# Patient Record
Sex: Male | Born: 1935 | Race: White | Hispanic: No | Smoking: Former smoker
Health system: Southern US, Community
[De-identification: ages and names within clinical notes are randomized; demographics above are authoritative.]

## PROBLEM LIST (undated history)

## (undated) DIAGNOSIS — I952 Hypotension due to drugs: Secondary | ICD-10-CM

## (undated) DIAGNOSIS — R001 Bradycardia, unspecified: Secondary | ICD-10-CM

## (undated) DIAGNOSIS — M810 Age-related osteoporosis without current pathological fracture: Secondary | ICD-10-CM

## (undated) DIAGNOSIS — M545 Low back pain, unspecified: Secondary | ICD-10-CM

## (undated) DIAGNOSIS — K5909 Other constipation: Secondary | ICD-10-CM

## (undated) DIAGNOSIS — I429 Cardiomyopathy, unspecified: Secondary | ICD-10-CM

## (undated) DIAGNOSIS — K4090 Unilateral inguinal hernia, without obstruction or gangrene, not specified as recurrent: Secondary | ICD-10-CM

## (undated) DIAGNOSIS — D539 Nutritional anemia, unspecified: Secondary | ICD-10-CM

## (undated) DIAGNOSIS — R55 Syncope and collapse: Secondary | ICD-10-CM

## (undated) DIAGNOSIS — G8929 Other chronic pain: Secondary | ICD-10-CM

## (undated) DIAGNOSIS — I5032 Chronic diastolic (congestive) heart failure: Secondary | ICD-10-CM

## (undated) DIAGNOSIS — G319 Degenerative disease of nervous system, unspecified: Secondary | ICD-10-CM

## (undated) DIAGNOSIS — D509 Iron deficiency anemia, unspecified: Secondary | ICD-10-CM

## (undated) DIAGNOSIS — I2510 Atherosclerotic heart disease of native coronary artery without angina pectoris: Secondary | ICD-10-CM

## (undated) DIAGNOSIS — I219 Acute myocardial infarction, unspecified: Secondary | ICD-10-CM

## (undated) DIAGNOSIS — I447 Left bundle-branch block, unspecified: Secondary | ICD-10-CM

## (undated) DIAGNOSIS — E8809 Other disorders of plasma-protein metabolism, not elsewhere classified: Secondary | ICD-10-CM

## (undated) DIAGNOSIS — M4850XA Collapsed vertebra, not elsewhere classified, site unspecified, initial encounter for fracture: Secondary | ICD-10-CM

## (undated) DIAGNOSIS — I639 Cerebral infarction, unspecified: Principal | ICD-10-CM

## (undated) DIAGNOSIS — K210 Gastro-esophageal reflux disease with esophagitis: Secondary | ICD-10-CM

## (undated) DIAGNOSIS — F0390 Unspecified dementia without behavioral disturbance: Secondary | ICD-10-CM

## (undated) DIAGNOSIS — M479 Spondylosis, unspecified: Secondary | ICD-10-CM

## (undated) DIAGNOSIS — F32A Depression, unspecified: Secondary | ICD-10-CM

## (undated) DIAGNOSIS — F1020 Alcohol dependence, uncomplicated: Secondary | ICD-10-CM

## (undated) DIAGNOSIS — C61 Malignant neoplasm of prostate: Secondary | ICD-10-CM

## (undated) DIAGNOSIS — D638 Anemia in other chronic diseases classified elsewhere: Secondary | ICD-10-CM

## (undated) DIAGNOSIS — I25810 Atherosclerosis of coronary artery bypass graft(s) without angina pectoris: Secondary | ICD-10-CM

## (undated) DIAGNOSIS — K000 Anodontia: Secondary | ICD-10-CM

## (undated) DIAGNOSIS — E7800 Pure hypercholesterolemia, unspecified: Secondary | ICD-10-CM

## (undated) DIAGNOSIS — K760 Fatty (change of) liver, not elsewhere classified: Secondary | ICD-10-CM

## (undated) DIAGNOSIS — N529 Male erectile dysfunction, unspecified: Secondary | ICD-10-CM

## (undated) DIAGNOSIS — E781 Pure hyperglyceridemia: Secondary | ICD-10-CM

## (undated) DIAGNOSIS — I5020 Unspecified systolic (congestive) heart failure: Secondary | ICD-10-CM

## (undated) DIAGNOSIS — S129XXA Fracture of neck, unspecified, initial encounter: Secondary | ICD-10-CM

## (undated) DIAGNOSIS — K219 Gastro-esophageal reflux disease without esophagitis: Secondary | ICD-10-CM

## (undated) DIAGNOSIS — R1032 Left lower quadrant pain: Secondary | ICD-10-CM

## (undated) DIAGNOSIS — I4891 Unspecified atrial fibrillation: Secondary | ICD-10-CM

## (undated) DIAGNOSIS — IMO0002 Reserved for concepts with insufficient information to code with codable children: Secondary | ICD-10-CM

## (undated) DIAGNOSIS — Z8546 Personal history of malignant neoplasm of prostate: Secondary | ICD-10-CM

## (undated) DIAGNOSIS — E871 Hypo-osmolality and hyponatremia: Secondary | ICD-10-CM

## (undated) DIAGNOSIS — Z950 Presence of cardiac pacemaker: Secondary | ICD-10-CM

## (undated) DIAGNOSIS — I10 Essential (primary) hypertension: Secondary | ICD-10-CM

## (undated) DIAGNOSIS — Z9181 History of falling: Secondary | ICD-10-CM

## (undated) DIAGNOSIS — Z593 Problems related to living in residential institution: Secondary | ICD-10-CM

## (undated) DIAGNOSIS — K852 Alcohol induced acute pancreatitis without necrosis or infection: Secondary | ICD-10-CM

## (undated) DIAGNOSIS — F329 Major depressive disorder, single episode, unspecified: Secondary | ICD-10-CM

## (undated) DIAGNOSIS — G9389 Other specified disorders of brain: Secondary | ICD-10-CM

## (undated) DIAGNOSIS — F1010 Alcohol abuse, uncomplicated: Secondary | ICD-10-CM

## (undated) DIAGNOSIS — J309 Allergic rhinitis, unspecified: Secondary | ICD-10-CM

## (undated) DIAGNOSIS — R531 Weakness: Secondary | ICD-10-CM

## (undated) DIAGNOSIS — R1031 Right lower quadrant pain: Secondary | ICD-10-CM

## (undated) DIAGNOSIS — R931 Abnormal findings on diagnostic imaging of heart and coronary circulation: Secondary | ICD-10-CM

## (undated) DIAGNOSIS — I200 Unstable angina: Secondary | ICD-10-CM

## (undated) DIAGNOSIS — G992 Myelopathy in diseases classified elsewhere: Secondary | ICD-10-CM

## (undated) DIAGNOSIS — N390 Urinary tract infection, site not specified: Secondary | ICD-10-CM

## (undated) DIAGNOSIS — D649 Anemia, unspecified: Secondary | ICD-10-CM

## (undated) DIAGNOSIS — R2681 Unsteadiness on feet: Secondary | ICD-10-CM

## (undated) DIAGNOSIS — N401 Enlarged prostate with lower urinary tract symptoms: Secondary | ICD-10-CM

## (undated) DIAGNOSIS — I5022 Chronic systolic (congestive) heart failure: Secondary | ICD-10-CM

## (undated) HISTORY — DX: Iron deficiency anemia, unspecified: D50.9

## (undated) HISTORY — DX: Gastro-esophageal reflux disease with esophagitis: K21.0

## (undated) HISTORY — DX: Other chronic pain: G89.29

## (undated) HISTORY — DX: Acute myocardial infarction, unspecified: I21.9

## (undated) HISTORY — DX: Hypocalcemia: E83.51

## (undated) HISTORY — DX: Syncope and collapse: R55

## (undated) HISTORY — DX: Nutritional anemia, unspecified: D53.9

## (undated) HISTORY — DX: Weakness: R53.1

## (undated) HISTORY — DX: Benign prostatic hyperplasia with lower urinary tract symptoms: N40.1

## (undated) HISTORY — DX: Low back pain: M54.5

## (undated) HISTORY — DX: Allergic rhinitis, unspecified: J30.9

## (undated) HISTORY — DX: Problems related to living in residential institution: Z59.3

## (undated) HISTORY — DX: Chronic systolic (congestive) heart failure: I50.22

## (undated) HISTORY — DX: Fracture of neck, unspecified, initial encounter: S12.9XXA

## (undated) HISTORY — DX: Other constipation: K59.09

## (undated) HISTORY — DX: Cerebral infarction, unspecified: I63.9

## (undated) HISTORY — DX: Hypo-osmolality and hyponatremia: E87.1

## (undated) HISTORY — DX: Unspecified atrial fibrillation: I48.91

## (undated) HISTORY — DX: Urinary tract infection, site not specified: N39.0

## (undated) HISTORY — DX: Bradycardia, unspecified: R00.1

## (undated) HISTORY — DX: Unsteadiness on feet: R26.81

## (undated) HISTORY — PX: FRACTURE SURGERY: SHX138

## (undated) HISTORY — DX: Degenerative disease of nervous system, unspecified: G31.9

## (undated) HISTORY — DX: Unstable angina: I20.0

## (undated) HISTORY — DX: Hypotension due to drugs: I95.2

## (undated) HISTORY — DX: Atherosclerotic heart disease of native coronary artery without angina pectoris: I25.10

## (undated) HISTORY — DX: Male erectile dysfunction, unspecified: N52.9

## (undated) HISTORY — DX: Alcohol dependence, uncomplicated: F10.20

## (undated) HISTORY — DX: Anemia in other chronic diseases classified elsewhere: D63.8

## (undated) HISTORY — DX: Personal history of malignant neoplasm of prostate: Z85.46

## (undated) HISTORY — DX: Abnormal findings on diagnostic imaging of heart and coronary circulation: R93.1

## (undated) HISTORY — DX: Unspecified dementia without behavioral disturbance: F03.90

## (undated) HISTORY — DX: Spondylosis, unspecified: M47.9

## (undated) HISTORY — DX: Alcohol induced acute pancreatitis without necrosis or infection: K85.20

## (undated) HISTORY — DX: Low back pain, unspecified: M54.50

## (undated) HISTORY — DX: Other disorders of plasma-protein metabolism, not elsewhere classified: E88.09

## (undated) HISTORY — DX: Essential (primary) hypertension: I10

## (undated) HISTORY — DX: Other specified disorders of brain: G93.89

## (undated) HISTORY — DX: Reserved for concepts with insufficient information to code with codable children: IMO0002

## (undated) HISTORY — DX: Atherosclerosis of coronary artery bypass graft(s) without angina pectoris: I25.810

## (undated) HISTORY — DX: History of falling: Z91.81

## (undated) HISTORY — DX: Presence of cardiac pacemaker: Z95.0

## (undated) HISTORY — DX: Left bundle-branch block, unspecified: I44.7

## (undated) HISTORY — DX: Malignant neoplasm of prostate: C61

## (undated) HISTORY — PX: ORIF METATARSAL FRACTURE: SUR942

## (undated) HISTORY — DX: Myelopathy in diseases classified elsewhere: G99.2

## (undated) HISTORY — DX: Unilateral inguinal hernia, without obstruction or gangrene, not specified as recurrent: K40.90

## (undated) HISTORY — DX: Chronic diastolic (congestive) heart failure: I50.32

---

## 1983-04-02 HISTORY — PX: CORONARY ARTERY BYPASS GRAFT: SHX141

## 1995-04-02 HISTORY — PX: CORONARY ARTERY BYPASS GRAFT: SHX141

## 1998-05-15 ENCOUNTER — Encounter: Payer: Self-pay | Admitting: Emergency Medicine

## 1998-05-15 ENCOUNTER — Inpatient Hospital Stay (HOSPITAL_COMMUNITY): Admission: EM | Admit: 1998-05-15 | Discharge: 1998-05-18 | Payer: Self-pay | Admitting: Emergency Medicine

## 1998-05-17 ENCOUNTER — Encounter: Payer: Self-pay | Admitting: Emergency Medicine

## 2001-06-27 ENCOUNTER — Encounter: Payer: Self-pay | Admitting: Emergency Medicine

## 2001-06-27 ENCOUNTER — Inpatient Hospital Stay (HOSPITAL_COMMUNITY): Admission: EM | Admit: 2001-06-27 | Discharge: 2001-07-02 | Payer: Self-pay | Admitting: *Deleted

## 2001-06-29 ENCOUNTER — Encounter: Payer: Self-pay | Admitting: Cardiovascular Disease

## 2001-06-30 ENCOUNTER — Encounter: Payer: Self-pay | Admitting: Cardiovascular Disease

## 2001-07-01 ENCOUNTER — Encounter: Payer: Self-pay | Admitting: Cardiovascular Disease

## 2002-02-05 ENCOUNTER — Emergency Department (HOSPITAL_COMMUNITY): Admission: EM | Admit: 2002-02-05 | Discharge: 2002-02-05 | Payer: Self-pay | Admitting: Emergency Medicine

## 2002-02-07 ENCOUNTER — Emergency Department (HOSPITAL_COMMUNITY): Admission: EM | Admit: 2002-02-07 | Discharge: 2002-02-07 | Payer: Self-pay | Admitting: Emergency Medicine

## 2002-02-18 ENCOUNTER — Emergency Department (HOSPITAL_COMMUNITY): Admission: EM | Admit: 2002-02-18 | Discharge: 2002-02-18 | Payer: Self-pay | Admitting: Emergency Medicine

## 2002-02-18 ENCOUNTER — Encounter: Payer: Self-pay | Admitting: Emergency Medicine

## 2002-02-20 ENCOUNTER — Emergency Department (HOSPITAL_COMMUNITY): Admission: EM | Admit: 2002-02-20 | Discharge: 2002-02-20 | Payer: Self-pay | Admitting: Emergency Medicine

## 2002-02-20 ENCOUNTER — Inpatient Hospital Stay (HOSPITAL_COMMUNITY): Admission: EM | Admit: 2002-02-20 | Discharge: 2002-02-22 | Payer: Self-pay | Admitting: Emergency Medicine

## 2002-02-20 ENCOUNTER — Encounter: Payer: Self-pay | Admitting: *Deleted

## 2002-08-23 ENCOUNTER — Inpatient Hospital Stay (HOSPITAL_COMMUNITY): Admission: EM | Admit: 2002-08-23 | Discharge: 2002-08-25 | Payer: Self-pay | Admitting: *Deleted

## 2002-08-23 ENCOUNTER — Encounter: Payer: Self-pay | Admitting: Emergency Medicine

## 2002-08-24 ENCOUNTER — Encounter: Payer: Self-pay | Admitting: Cardiovascular Disease

## 2002-11-23 ENCOUNTER — Emergency Department (HOSPITAL_COMMUNITY): Admission: EM | Admit: 2002-11-23 | Discharge: 2002-11-23 | Payer: Self-pay

## 2003-03-27 ENCOUNTER — Emergency Department (HOSPITAL_COMMUNITY): Admission: EM | Admit: 2003-03-27 | Discharge: 2003-03-27 | Payer: Self-pay | Admitting: Emergency Medicine

## 2003-06-25 ENCOUNTER — Inpatient Hospital Stay (HOSPITAL_COMMUNITY): Admission: EM | Admit: 2003-06-25 | Discharge: 2003-06-27 | Payer: Self-pay | Admitting: Emergency Medicine

## 2004-03-07 ENCOUNTER — Ambulatory Visit: Payer: Self-pay | Admitting: Family Medicine

## 2004-03-13 ENCOUNTER — Ambulatory Visit: Payer: Self-pay | Admitting: Family Medicine

## 2004-03-23 ENCOUNTER — Ambulatory Visit: Payer: Self-pay | Admitting: Sports Medicine

## 2004-05-02 ENCOUNTER — Ambulatory Visit: Payer: Self-pay | Admitting: Family Medicine

## 2004-06-05 ENCOUNTER — Ambulatory Visit: Payer: Self-pay | Admitting: Family Medicine

## 2004-06-13 ENCOUNTER — Ambulatory Visit: Payer: Self-pay | Admitting: Family Medicine

## 2004-06-13 ENCOUNTER — Ambulatory Visit (HOSPITAL_COMMUNITY): Admission: RE | Admit: 2004-06-13 | Discharge: 2004-06-13 | Payer: Self-pay | Admitting: Family Medicine

## 2004-06-13 HISTORY — PX: CATARACT EXTRACTION: SUR2

## 2004-06-30 DIAGNOSIS — C61 Malignant neoplasm of prostate: Secondary | ICD-10-CM

## 2004-06-30 HISTORY — DX: Malignant neoplasm of prostate: C61

## 2004-07-13 ENCOUNTER — Ambulatory Visit: Payer: Self-pay | Admitting: Family Medicine

## 2004-07-13 HISTORY — PX: PROSTATE BIOPSY: SHX241

## 2005-02-06 ENCOUNTER — Ambulatory Visit: Payer: Self-pay | Admitting: Family Medicine

## 2005-02-07 ENCOUNTER — Encounter: Admission: RE | Admit: 2005-02-07 | Discharge: 2005-02-07 | Payer: Self-pay | Admitting: Family Medicine

## 2005-02-27 ENCOUNTER — Ambulatory Visit: Payer: Self-pay | Admitting: Family Medicine

## 2005-03-01 DIAGNOSIS — M479 Spondylosis, unspecified: Secondary | ICD-10-CM

## 2005-03-01 HISTORY — DX: Spondylosis, unspecified: M47.9

## 2005-04-23 ENCOUNTER — Emergency Department (HOSPITAL_COMMUNITY): Admission: EM | Admit: 2005-04-23 | Discharge: 2005-04-23 | Payer: Self-pay | Admitting: Emergency Medicine

## 2005-07-31 ENCOUNTER — Ambulatory Visit: Payer: Self-pay | Admitting: Family Medicine

## 2005-08-05 ENCOUNTER — Ambulatory Visit: Payer: Self-pay | Admitting: Family Medicine

## 2005-08-15 ENCOUNTER — Ambulatory Visit: Payer: Self-pay | Admitting: Family Medicine

## 2005-10-30 DIAGNOSIS — G319 Degenerative disease of nervous system, unspecified: Secondary | ICD-10-CM

## 2005-10-30 DIAGNOSIS — R55 Syncope and collapse: Secondary | ICD-10-CM

## 2005-10-30 HISTORY — DX: Degenerative disease of nervous system, unspecified: G31.9

## 2005-10-30 HISTORY — DX: Syncope and collapse: R55

## 2005-12-04 ENCOUNTER — Ambulatory Visit: Payer: Self-pay | Admitting: Family Medicine

## 2005-12-25 ENCOUNTER — Ambulatory Visit: Payer: Self-pay | Admitting: Family Medicine

## 2006-05-29 DIAGNOSIS — I10 Essential (primary) hypertension: Secondary | ICD-10-CM

## 2006-05-29 DIAGNOSIS — G8929 Other chronic pain: Secondary | ICD-10-CM

## 2006-05-29 DIAGNOSIS — M545 Low back pain, unspecified: Secondary | ICD-10-CM

## 2006-05-29 DIAGNOSIS — K2100 Gastro-esophageal reflux disease with esophagitis, without bleeding: Secondary | ICD-10-CM

## 2006-05-29 DIAGNOSIS — J309 Allergic rhinitis, unspecified: Secondary | ICD-10-CM | POA: Insufficient documentation

## 2006-05-29 DIAGNOSIS — Z8546 Personal history of malignant neoplasm of prostate: Secondary | ICD-10-CM

## 2006-05-29 DIAGNOSIS — E781 Pure hyperglyceridemia: Secondary | ICD-10-CM | POA: Insufficient documentation

## 2006-05-29 HISTORY — DX: Gastro-esophageal reflux disease with esophagitis, without bleeding: K21.00

## 2006-05-29 HISTORY — DX: Other chronic pain: G89.29

## 2006-05-29 HISTORY — DX: Low back pain, unspecified: M54.50

## 2006-05-29 HISTORY — DX: Personal history of malignant neoplasm of prostate: Z85.46

## 2006-05-29 HISTORY — DX: Pure hyperglyceridemia: E78.1

## 2006-05-29 HISTORY — DX: Essential (primary) hypertension: I10

## 2006-05-29 HISTORY — DX: Allergic rhinitis, unspecified: J30.9

## 2006-06-09 ENCOUNTER — Telehealth (INDEPENDENT_AMBULATORY_CARE_PROVIDER_SITE_OTHER): Payer: Self-pay | Admitting: *Deleted

## 2006-06-11 ENCOUNTER — Ambulatory Visit: Payer: Self-pay | Admitting: Family Medicine

## 2006-06-11 DIAGNOSIS — N529 Male erectile dysfunction, unspecified: Secondary | ICD-10-CM

## 2006-06-11 DIAGNOSIS — N138 Other obstructive and reflux uropathy: Secondary | ICD-10-CM

## 2006-06-11 DIAGNOSIS — F1010 Alcohol abuse, uncomplicated: Secondary | ICD-10-CM | POA: Insufficient documentation

## 2006-06-11 DIAGNOSIS — N401 Enlarged prostate with lower urinary tract symptoms: Secondary | ICD-10-CM

## 2006-06-11 HISTORY — DX: Other obstructive and reflux uropathy: N13.8

## 2006-06-11 HISTORY — DX: Male erectile dysfunction, unspecified: N52.9

## 2006-06-11 HISTORY — DX: Alcohol abuse, uncomplicated: F10.10

## 2006-07-11 ENCOUNTER — Telehealth: Payer: Self-pay | Admitting: Family Medicine

## 2006-08-12 ENCOUNTER — Telehealth: Payer: Self-pay | Admitting: *Deleted

## 2006-08-18 ENCOUNTER — Ambulatory Visit: Payer: Self-pay | Admitting: Family Medicine

## 2006-08-18 LAB — CONVERTED CEMR LAB
AST: 30 units/L (ref 0–37)
Albumin: 3.9 g/dL (ref 3.5–5.2)
BUN: 6 mg/dL (ref 6–23)
Calcium: 9.1 mg/dL (ref 8.4–10.5)
Chloride: 95 meq/L — ABNORMAL LOW (ref 96–112)
Glucose, Bld: 85 mg/dL (ref 70–99)
HDL: 66 mg/dL (ref >39)
Potassium: 4.3 meq/L (ref 3.5–5.3)
Triglycerides: 402 mg/dL — ABNORMAL HIGH (ref <150)

## 2006-08-20 ENCOUNTER — Ambulatory Visit: Payer: Self-pay | Admitting: Family Medicine

## 2006-09-03 ENCOUNTER — Telehealth (INDEPENDENT_AMBULATORY_CARE_PROVIDER_SITE_OTHER): Payer: Self-pay | Admitting: Family Medicine

## 2006-10-14 ENCOUNTER — Telehealth: Payer: Self-pay | Admitting: Family Medicine

## 2006-10-15 ENCOUNTER — Telehealth: Payer: Self-pay | Admitting: *Deleted

## 2006-10-27 ENCOUNTER — Encounter (INDEPENDENT_AMBULATORY_CARE_PROVIDER_SITE_OTHER): Payer: Self-pay | Admitting: Family Medicine

## 2006-10-27 ENCOUNTER — Ambulatory Visit: Payer: Self-pay | Admitting: Family Medicine

## 2006-10-27 LAB — CONVERTED CEMR LAB: Direct LDL: 83 mg/dL

## 2007-02-12 ENCOUNTER — Ambulatory Visit: Payer: Self-pay | Admitting: Family Medicine

## 2007-02-12 DIAGNOSIS — Z8719 Personal history of other diseases of the digestive system: Secondary | ICD-10-CM

## 2007-02-12 LAB — CONVERTED CEMR LAB
HDL goal, serum: 40 mg/dL
LDL Goal: 100 mg/dL

## 2007-02-24 ENCOUNTER — Telehealth: Payer: Self-pay | Admitting: *Deleted

## 2007-03-02 ENCOUNTER — Encounter: Payer: Self-pay | Admitting: Family Medicine

## 2007-03-02 LAB — CONVERTED CEMR LAB: PSA: 4.04 ng/mL

## 2007-04-17 ENCOUNTER — Encounter: Payer: Self-pay | Admitting: Family Medicine

## 2007-05-02 ENCOUNTER — Ambulatory Visit: Payer: Self-pay | Admitting: Family Medicine

## 2007-05-02 ENCOUNTER — Observation Stay (HOSPITAL_COMMUNITY): Admission: EM | Admit: 2007-05-02 | Discharge: 2007-05-02 | Payer: Self-pay | Admitting: Emergency Medicine

## 2007-05-02 ENCOUNTER — Encounter: Payer: Self-pay | Admitting: Family Medicine

## 2007-05-02 LAB — CONVERTED CEMR LAB: LDL Cholesterol: 58 mg/dL

## 2007-05-07 ENCOUNTER — Telehealth: Payer: Self-pay | Admitting: Family Medicine

## 2007-07-31 DIAGNOSIS — R001 Bradycardia, unspecified: Secondary | ICD-10-CM

## 2007-07-31 HISTORY — PX: CARDIAC CATHETERIZATION: SHX172

## 2007-07-31 HISTORY — DX: Bradycardia, unspecified: R00.1

## 2007-08-07 ENCOUNTER — Ambulatory Visit: Payer: Self-pay | Admitting: Internal Medicine

## 2007-08-07 ENCOUNTER — Encounter: Payer: Self-pay | Admitting: *Deleted

## 2007-08-07 ENCOUNTER — Telehealth: Payer: Self-pay | Admitting: Family Medicine

## 2007-08-08 ENCOUNTER — Encounter (INDEPENDENT_AMBULATORY_CARE_PROVIDER_SITE_OTHER): Payer: Self-pay | Admitting: Interventional Cardiology

## 2007-08-08 ENCOUNTER — Inpatient Hospital Stay (HOSPITAL_COMMUNITY): Admission: RE | Admit: 2007-08-08 | Discharge: 2007-08-09 | Payer: Self-pay | Admitting: Interventional Cardiology

## 2007-08-08 ENCOUNTER — Encounter: Payer: Self-pay | Admitting: Family Medicine

## 2007-08-08 ENCOUNTER — Ambulatory Visit: Payer: Self-pay | Admitting: Internal Medicine

## 2007-08-08 LAB — CONVERTED CEMR LAB: Potassium: 4.5 meq/L

## 2007-08-09 ENCOUNTER — Encounter (INDEPENDENT_AMBULATORY_CARE_PROVIDER_SITE_OTHER): Payer: Self-pay | Admitting: Family Medicine

## 2007-08-11 ENCOUNTER — Ambulatory Visit: Payer: Self-pay | Admitting: Family Medicine

## 2007-08-18 ENCOUNTER — Encounter: Payer: Self-pay | Admitting: Family Medicine

## 2007-08-27 ENCOUNTER — Ambulatory Visit: Payer: Self-pay | Admitting: Family Medicine

## 2007-08-27 DIAGNOSIS — G992 Myelopathy in diseases classified elsewhere: Secondary | ICD-10-CM

## 2007-08-27 HISTORY — DX: Myelopathy in diseases classified elsewhere: G99.2

## 2007-08-28 ENCOUNTER — Encounter: Payer: Self-pay | Admitting: Family Medicine

## 2007-08-31 HISTORY — PX: CATARACT EXTRACTION: SUR2

## 2007-09-17 ENCOUNTER — Telehealth: Payer: Self-pay | Admitting: Family Medicine

## 2007-10-01 ENCOUNTER — Ambulatory Visit: Payer: Self-pay | Admitting: Family Medicine

## 2007-10-01 LAB — CONVERTED CEMR LAB
HCT: 37.8 % — ABNORMAL LOW (ref 39.0–52.0)
Hemoglobin: 12.8 g/dL — ABNORMAL LOW (ref 13.0–17.0)
RBC: 3.83 M/uL — ABNORMAL LOW (ref 4.22–5.81)
RDW: 12.8 % (ref 11.5–15.5)
WBC: 5.3 10*3/uL (ref 4.0–10.5)

## 2008-01-15 ENCOUNTER — Ambulatory Visit: Payer: Self-pay | Admitting: Family Medicine

## 2008-01-22 ENCOUNTER — Telehealth: Payer: Self-pay | Admitting: *Deleted

## 2008-02-09 ENCOUNTER — Ambulatory Visit: Payer: Self-pay | Admitting: Family Medicine

## 2008-03-17 ENCOUNTER — Ambulatory Visit: Payer: Self-pay | Admitting: Family Medicine

## 2008-03-17 ENCOUNTER — Ambulatory Visit (HOSPITAL_COMMUNITY): Admission: RE | Admit: 2008-03-17 | Discharge: 2008-03-17 | Payer: Self-pay | Admitting: Family Medicine

## 2008-03-17 DIAGNOSIS — D649 Anemia, unspecified: Secondary | ICD-10-CM | POA: Insufficient documentation

## 2008-03-17 LAB — CONVERTED CEMR LAB
AST: 17 units/L (ref 0–37)
Alkaline Phosphatase: 55 units/L (ref 39–117)
BUN: 15 mg/dL (ref 6–23)
Creatinine, Ser: 1.05 mg/dL (ref 0.40–1.50)
Glucose, Bld: 94 mg/dL (ref 70–99)
HCT: 39.9 % (ref 39.0–52.0)
Hemoglobin: 13.4 g/dL (ref 13.0–17.0)
MCHC: 33.6 g/dL (ref 30.0–36.0)
MCV: 96.1 fL (ref 78.0–100.0)
RDW: 12.6 % (ref 11.5–15.5)
Total Bilirubin: 1.4 mg/dL — ABNORMAL HIGH (ref 0.3–1.2)

## 2008-03-29 ENCOUNTER — Encounter: Payer: Self-pay | Admitting: Family Medicine

## 2008-03-29 LAB — CONVERTED CEMR LAB
OCCULT 2: NEGATIVE
OCCULT 3: NEGATIVE

## 2008-03-30 ENCOUNTER — Encounter: Payer: Self-pay | Admitting: Family Medicine

## 2008-04-25 ENCOUNTER — Encounter: Payer: Self-pay | Admitting: Family Medicine

## 2008-05-05 ENCOUNTER — Ambulatory Visit: Payer: Self-pay | Admitting: Family Medicine

## 2008-05-05 DIAGNOSIS — K4090 Unilateral inguinal hernia, without obstruction or gangrene, not specified as recurrent: Secondary | ICD-10-CM | POA: Insufficient documentation

## 2008-05-05 HISTORY — DX: Unilateral inguinal hernia, without obstruction or gangrene, not specified as recurrent: K40.90

## 2008-06-01 ENCOUNTER — Inpatient Hospital Stay (HOSPITAL_COMMUNITY): Admission: AD | Admit: 2008-06-01 | Discharge: 2008-06-02 | Payer: Self-pay | Admitting: General Surgery

## 2008-06-15 ENCOUNTER — Emergency Department (HOSPITAL_COMMUNITY): Admission: EM | Admit: 2008-06-15 | Discharge: 2008-06-16 | Payer: Self-pay | Admitting: Emergency Medicine

## 2008-08-18 ENCOUNTER — Ambulatory Visit: Payer: Self-pay | Admitting: Family Medicine

## 2008-08-18 LAB — CONVERTED CEMR LAB

## 2008-08-30 ENCOUNTER — Ambulatory Visit: Admission: RE | Admit: 2008-08-30 | Discharge: 2008-08-30 | Payer: Self-pay | Admitting: Radiation Oncology

## 2008-09-06 ENCOUNTER — Encounter: Payer: Self-pay | Admitting: Family Medicine

## 2008-09-06 ENCOUNTER — Ambulatory Visit: Admission: RE | Admit: 2008-09-06 | Discharge: 2008-12-05 | Payer: Self-pay | Admitting: Radiation Oncology

## 2008-09-07 ENCOUNTER — Encounter: Payer: Self-pay | Admitting: Family Medicine

## 2008-09-09 ENCOUNTER — Telehealth: Payer: Self-pay | Admitting: Family Medicine

## 2008-10-27 ENCOUNTER — Ambulatory Visit (HOSPITAL_BASED_OUTPATIENT_CLINIC_OR_DEPARTMENT_OTHER): Admission: RE | Admit: 2008-10-27 | Discharge: 2008-10-27 | Payer: Self-pay | Admitting: Urology

## 2008-10-27 ENCOUNTER — Encounter: Payer: Self-pay | Admitting: Family Medicine

## 2008-11-17 ENCOUNTER — Encounter: Payer: Self-pay | Admitting: Family Medicine

## 2008-11-22 ENCOUNTER — Encounter: Payer: Self-pay | Admitting: Family Medicine

## 2009-01-02 ENCOUNTER — Encounter: Payer: Self-pay | Admitting: Family Medicine

## 2009-01-02 ENCOUNTER — Ambulatory Visit: Admission: RE | Admit: 2009-01-02 | Discharge: 2009-03-07 | Payer: Self-pay | Admitting: Radiation Oncology

## 2009-02-09 ENCOUNTER — Encounter: Payer: Self-pay | Admitting: *Deleted

## 2009-02-27 ENCOUNTER — Telehealth: Payer: Self-pay | Admitting: *Deleted

## 2009-03-02 ENCOUNTER — Telehealth (INDEPENDENT_AMBULATORY_CARE_PROVIDER_SITE_OTHER): Payer: Self-pay | Admitting: *Deleted

## 2009-03-09 ENCOUNTER — Ambulatory Visit: Payer: Self-pay | Admitting: Family Medicine

## 2009-03-09 DIAGNOSIS — G250 Essential tremor: Secondary | ICD-10-CM | POA: Insufficient documentation

## 2009-03-09 DIAGNOSIS — G252 Other specified forms of tremor: Secondary | ICD-10-CM

## 2009-03-17 ENCOUNTER — Encounter: Payer: Self-pay | Admitting: Family Medicine

## 2009-03-18 LAB — CONVERTED CEMR LAB
AST: 49 units/L — ABNORMAL HIGH (ref 0–37)
Albumin: 4.6 g/dL (ref 3.5–5.2)
Alkaline Phosphatase: 79 units/L (ref 39–117)
BUN: 12 mg/dL (ref 6–23)
Calcium: 9.3 mg/dL (ref 8.4–10.5)
Chloride: 92 meq/L — ABNORMAL LOW (ref 96–112)
Glucose, Bld: 84 mg/dL (ref 70–99)
HDL: 87 mg/dL (ref >39)
Hemoglobin: 13.9 g/dL (ref 13.0–17.0)
LDL Cholesterol: 72 mg/dL (ref 0–99)
Potassium: 4 meq/L (ref 3.5–5.3)
RBC: 4.09 M/uL — ABNORMAL LOW (ref 4.22–5.81)
Sodium: 133 meq/L — ABNORMAL LOW (ref 135–145)
Total Protein: 7.2 g/dL (ref 6.0–8.3)
Triglycerides: 106 mg/dL (ref <150)

## 2009-03-30 ENCOUNTER — Telehealth: Payer: Self-pay | Admitting: Family Medicine

## 2009-04-01 DIAGNOSIS — R931 Abnormal findings on diagnostic imaging of heart and coronary circulation: Secondary | ICD-10-CM

## 2009-04-01 DIAGNOSIS — I4891 Unspecified atrial fibrillation: Secondary | ICD-10-CM

## 2009-04-01 HISTORY — DX: Unspecified atrial fibrillation: I48.91

## 2009-04-01 HISTORY — DX: Abnormal findings on diagnostic imaging of heart and coronary circulation: R93.1

## 2009-04-05 ENCOUNTER — Ambulatory Visit: Payer: Self-pay | Admitting: Family Medicine

## 2009-04-05 ENCOUNTER — Encounter: Payer: Self-pay | Admitting: Family Medicine

## 2009-04-05 LAB — CONVERTED CEMR LAB
ALT: 42 units/L (ref 0–53)
Albumin: 4.1 g/dL (ref 3.5–5.2)
CO2: 25 meq/L (ref 19–32)
Calcium: 8.9 mg/dL (ref 8.4–10.5)
Chloride: 92 meq/L — ABNORMAL LOW (ref 96–112)
Glucose, Bld: 92 mg/dL (ref 70–99)
Sodium: 133 meq/L — ABNORMAL LOW (ref 135–145)
Total Bilirubin: 0.9 mg/dL (ref 0.3–1.2)
Total Protein: 6.3 g/dL (ref 6.0–8.3)

## 2009-04-12 ENCOUNTER — Ambulatory Visit: Payer: Self-pay | Admitting: Internal Medicine

## 2009-04-12 ENCOUNTER — Ambulatory Visit: Payer: Self-pay | Admitting: Family Medicine

## 2009-04-12 ENCOUNTER — Inpatient Hospital Stay (HOSPITAL_COMMUNITY): Admission: EM | Admit: 2009-04-12 | Discharge: 2009-04-14 | Payer: Self-pay | Admitting: Emergency Medicine

## 2009-04-12 ENCOUNTER — Encounter (INDEPENDENT_AMBULATORY_CARE_PROVIDER_SITE_OTHER): Payer: Self-pay | Admitting: Internal Medicine

## 2009-04-12 ENCOUNTER — Encounter: Payer: Self-pay | Admitting: Family Medicine

## 2009-04-14 ENCOUNTER — Encounter: Payer: Self-pay | Admitting: Internal Medicine

## 2009-04-14 ENCOUNTER — Encounter: Payer: Self-pay | Admitting: Family Medicine

## 2009-04-17 ENCOUNTER — Encounter: Payer: Self-pay | Admitting: Family Medicine

## 2009-04-17 ENCOUNTER — Ambulatory Visit: Payer: Self-pay | Admitting: Family Medicine

## 2009-04-17 LAB — CONVERTED CEMR LAB
CO2: 25 meq/L (ref 19–32)
Calcium: 9.1 mg/dL (ref 8.4–10.5)
Chloride: 99 meq/L (ref 96–112)
Glucose, Bld: 95 mg/dL (ref 70–99)
Platelets: 150 10*3/uL (ref 150–400)
Potassium: 3.8 meq/L (ref 3.5–5.3)
RBC: 3.12 M/uL — ABNORMAL LOW (ref 4.22–5.81)
Sodium: 137 meq/L (ref 135–145)
WBC: 4.2 10*3/uL (ref 4.0–10.5)

## 2009-04-19 ENCOUNTER — Encounter: Payer: Self-pay | Admitting: Family Medicine

## 2009-04-20 ENCOUNTER — Ambulatory Visit (HOSPITAL_COMMUNITY): Admission: RE | Admit: 2009-04-20 | Discharge: 2009-04-20 | Payer: Self-pay | Admitting: Family Medicine

## 2009-04-20 ENCOUNTER — Ambulatory Visit: Payer: Self-pay | Admitting: Family Medicine

## 2009-04-20 DIAGNOSIS — K5909 Other constipation: Secondary | ICD-10-CM

## 2009-04-20 HISTORY — DX: Other constipation: K59.09

## 2009-04-25 ENCOUNTER — Encounter: Payer: Self-pay | Admitting: Family Medicine

## 2009-04-26 ENCOUNTER — Encounter: Payer: Self-pay | Admitting: Family Medicine

## 2009-04-26 ENCOUNTER — Ambulatory Visit: Payer: Self-pay | Admitting: Internal Medicine

## 2009-04-26 DIAGNOSIS — I447 Left bundle-branch block, unspecified: Secondary | ICD-10-CM

## 2009-04-26 HISTORY — DX: Left bundle-branch block, unspecified: I44.7

## 2009-05-04 ENCOUNTER — Telehealth: Payer: Self-pay | Admitting: Family Medicine

## 2009-09-29 HISTORY — PX: INSERTION PROSTATE RADIATION SEED: SUR718

## 2009-10-26 ENCOUNTER — Ambulatory Visit: Payer: Self-pay | Admitting: Family Medicine

## 2009-10-26 LAB — CONVERTED CEMR LAB
ALT: 34 units/L (ref 0–53)
BUN: 11 mg/dL (ref 6–23)
CO2: 24 meq/L (ref 19–32)
Calcium: 9.1 mg/dL (ref 8.4–10.5)
Chloride: 91 meq/L — ABNORMAL LOW (ref 96–112)
Creatinine, Ser: 0.99 mg/dL (ref 0.40–1.50)
Direct LDL: 53 mg/dL
Glucose, Bld: 75 mg/dL (ref 70–99)
Total Bilirubin: 1.6 mg/dL — ABNORMAL HIGH (ref 0.3–1.2)

## 2009-10-31 ENCOUNTER — Encounter: Payer: Self-pay | Admitting: Family Medicine

## 2009-10-31 DIAGNOSIS — R5383 Other fatigue: Secondary | ICD-10-CM

## 2009-10-31 DIAGNOSIS — E871 Hypo-osmolality and hyponatremia: Secondary | ICD-10-CM

## 2009-10-31 DIAGNOSIS — R5381 Other malaise: Secondary | ICD-10-CM

## 2009-11-06 ENCOUNTER — Encounter: Payer: Self-pay | Admitting: Family Medicine

## 2009-11-10 ENCOUNTER — Encounter: Payer: Self-pay | Admitting: Family Medicine

## 2009-11-10 ENCOUNTER — Ambulatory Visit: Payer: Self-pay | Admitting: Family Medicine

## 2009-11-10 ENCOUNTER — Inpatient Hospital Stay (HOSPITAL_COMMUNITY): Admission: AD | Admit: 2009-11-10 | Discharge: 2009-11-11 | Payer: Self-pay | Admitting: Family Medicine

## 2009-11-10 LAB — CONVERTED CEMR LAB
ALT: 36 units/L
Albumin: 3.1 g/dL
Creatinine, Ser: 0.8 mg/dL
HDL: 74 mg/dL
Pro B Natriuretic peptide (BNP): 323 pg/mL
Sodium: 131 meq/L
Triglyceride fasting, serum: 107 mg/dL

## 2009-11-11 LAB — CONVERTED CEMR LAB: Platelets: 93000 10*3/uL

## 2009-12-12 ENCOUNTER — Encounter: Payer: Self-pay | Admitting: Family Medicine

## 2009-12-12 DIAGNOSIS — I5032 Chronic diastolic (congestive) heart failure: Secondary | ICD-10-CM

## 2009-12-12 HISTORY — DX: Chronic diastolic (congestive) heart failure: I50.32

## 2009-12-14 ENCOUNTER — Ambulatory Visit: Payer: Self-pay | Admitting: Family Medicine

## 2009-12-14 DIAGNOSIS — I4891 Unspecified atrial fibrillation: Secondary | ICD-10-CM

## 2009-12-14 HISTORY — DX: Unspecified atrial fibrillation: I48.91

## 2009-12-14 LAB — CONVERTED CEMR LAB
Basophils Absolute: 0 10*3/uL (ref 0.0–0.1)
Basophils Relative: 0 % (ref 0–1)
CO2: 26 meq/L (ref 19–32)
Calcium: 9.3 mg/dL (ref 8.4–10.5)
Eosinophils Absolute: 0.1 10*3/uL (ref 0.0–0.7)
Eosinophils Relative: 1 % (ref 0–5)
HCT: 35.2 % — ABNORMAL LOW (ref 39.0–52.0)
Lymphocytes Relative: 27 % (ref 12–46)
MCHC: 34.4 g/dL (ref 30.0–36.0)
Platelets: 156 10*3/uL (ref 150–400)
RDW: 12.8 % (ref 11.5–15.5)
Sodium: 130 meq/L — ABNORMAL LOW (ref 135–145)

## 2009-12-15 ENCOUNTER — Encounter: Payer: Self-pay | Admitting: Family Medicine

## 2010-02-24 ENCOUNTER — Encounter: Payer: Self-pay | Admitting: Family Medicine

## 2010-03-30 DIAGNOSIS — R17 Unspecified jaundice: Secondary | ICD-10-CM

## 2010-04-24 ENCOUNTER — Encounter: Payer: Self-pay | Admitting: Family Medicine

## 2010-04-24 ENCOUNTER — Ambulatory Visit
Admission: RE | Admit: 2010-04-24 | Discharge: 2010-04-24 | Payer: Self-pay | Source: Home / Self Care | Attending: Family Medicine | Admitting: Family Medicine

## 2010-04-24 DIAGNOSIS — D539 Nutritional anemia, unspecified: Secondary | ICD-10-CM | POA: Insufficient documentation

## 2010-04-24 DIAGNOSIS — Z9181 History of falling: Secondary | ICD-10-CM

## 2010-04-24 HISTORY — DX: Nutritional anemia, unspecified: D53.9

## 2010-04-24 HISTORY — DX: History of falling: Z91.81

## 2010-04-24 LAB — CONVERTED CEMR LAB
Albumin: 4.3 g/dL (ref 3.5–5.2)
BUN: 14 mg/dL (ref 6–23)
CO2: 25 meq/L (ref 19–32)
Calcium: 9.5 mg/dL (ref 8.4–10.5)
Chloride: 91 meq/L — ABNORMAL LOW (ref 96–112)
Folate: >20 ng/mL
Glucose, Bld: 93 mg/dL (ref 70–99)
Hemoglobin: 12.4 g/dL — ABNORMAL LOW (ref 13.0–17.0)
Potassium: 3.8 meq/L (ref 3.5–5.3)
RBC: 3.68 M/uL — ABNORMAL LOW (ref 4.22–5.81)
WBC: 4.8 10*3/uL (ref 4.0–10.5)

## 2010-04-25 ENCOUNTER — Encounter: Payer: Self-pay | Admitting: Family Medicine

## 2010-04-29 LAB — CONVERTED CEMR LAB
Hemoglobin: 12.8 g/dL — ABNORMAL LOW (ref 13.0–17.0)
RDW: 12 % (ref 11.5–15.5)
WBC: 4.3 10*3/uL (ref 4.0–10.5)

## 2010-05-01 NOTE — Consult Note (Signed)
Summary: [facility]   [facility]   Imported By: Clydell Hakim 11/23/2009 11:27:29  _____________________________________________________________________  External Attachment:    Type:   Image     Comment:   External Document

## 2010-05-01 NOTE — Letter (Signed)
Summary: Results Follow-up Letter  [facility]  [address]   [place], [place] [address]   Phone: [phone]  Fax: [phone]    10/31/2009  [address] [place], [place]  [address]  Dear Mr. Barfield,   The following are the results of your recent test(s): Patient: [name]  Tests: (1) Comprehensive Metabolic Panel (37169)   Sodium               [L]  130 mEq/L                   135-145   Potassium                 4.5 mEq/L                   3.5-5.3   Chloride             [L]  91 mEq/L                    96-112   CO2                       24 mEq/L                    19-32   Glucose                   75 mg/dL                    67-89   BUN                       11 mg/dL                    3-81   Creatinine                0.99 mg/dL                  0.40-1.50   Bilirubin, Total     [H]  1.6 mg/dL                   0.1-7.5   Alkaline Phosphatase      60 U/L                      39-117   AST/SGOT             [H]  40 U/L                      0-37   ALT/SGPT                  34 U/L                      0-53   Total Protein             6.6 g/dL                    1.0-2.5   Albumin                   4.2 g/dL                    8.5-2.7   Calcium  9.1 mg/dL                   3.0-86.5  Tests: (2) LDL, Direct (78469)   LDL, Direct               53 mg/dL     ATP III Classification (LDL):           < 100        mg/dL         Optimal  Document Creation Date: 10/26/2009 8:41 PM  Sincerely,  Zachery Dauer MD [facility]           Appended Document: Results Follow-up Letter Result called to patient. He's using Sea Salt sparingly. I asked him to come in for BMET next week to make sure the sodium of 130 is not going lower;   Clinical Lists Changes  Problems: Added new problem of HYPONATREMIA, MILD (ICD-276.1) Added new problem of WEAKNESS (ICD-780.79) Orders: Added new Test order of CBC-[facility] [phone]) - Signed Added new Test  order of Retic-[facility] ([id number]) - Signed Added new Test order of Basic Met-[facility] [phone]) - Signed Added new Test order of Vit D, 25 [place]-[facility] ([id number]) - Signed Added new Test order of Ferritin-[facility] [phone]) - Signed

## 2010-05-01 NOTE — Assessment & Plan Note (Signed)
Summary: hfu,df   Vital Signs:  Patient profile:   75 year old male Weight:      185.1 pounds Temp:     98.3 degrees F oral Pulse rate:   121 / minute BP sitting:   148 / 98  (left arm) Cuff size:   regular  Vitals Entered By: Loralee Pacas CMA (April 20, 2009 9:51 AM) Comments irregular heartbeat, has an appt with Dr. Katrinka Blazing 05/15/09 but he would like to go in sooner considering whats going on with him at this point.   Primary Care Provider:  Zachery Dauer MD   History of Present Illness: Hospital follow up apt [place]  Reviwed discharge summary:  1) New onset A fib/flutter:  decision was made not to anticoag secondary to [place] score of 1, and risk for falls secondary to chronic ETOH use.  Patient reports being sober since January 10.  He is aware that his pulse if fast (beta blocker was decreased in hospital from 100 mg to 50 mg). Apt with cardiology moved up today, because of increased heart rate to 120.  2) Urinary Retention:  seen yesterday by oncologist and has apt. tomorrow with Dr. Annabell Howells.  He has radiation seed implants for treatment of prostate cancer. Descibes stream as slow, urinates often and small amounts.    3) Constipation:  still a problem, uses as needed MOM, and trying to eat foods high in fiber, received list from care manager.  Would like to be on a laxative.  Disucssed PEG, and will begin.  4) Pancytopenia during hospital stay, presumed related to Protonix or Heparin.  Labs drawn on 1/17, showed significant improvement in all cell lines.  5)  Electrolye imbalance: appears to be resolved  6)  hypotension during hospital stay:  BP up today to 146/98, increased beta blocker.  Current Medications (verified): 1)  Bayer Childrens Aspirin 81 Mg Chew (Aspirin) .... Take 1 Tablet By Mouth Once A Day 2)  Fish Oil Concentrate 1000 Mg  Caps (Omega-3 Fatty Acids) .... Take One Tablet Daily 3)  Omeprazole 20 Mg Tbec (Omeprazole) .... Take One Tablet  Daily 4)  Simvastatin 20 Mg Tabs (Simvastatin) .... Take One Tablet Daily At Bedtime 5)  Tramadol-Acetaminophen 37.5-325 Mg  Tabs (Tramadol-Acetaminophen) .... Take 1-2 Tabs Every 6 Hours As Needed 6)  Centrum Silver   Tabs (Multiple Vitamins-Minerals) .... Take One Tablet Daily 7)  Toprol Xl 100 Mg Xr24h-Tab (Metoprolol Succinate) .... Take One Tablet Daily 8)  Hydrochlorothiazide 12.5 Mg  Tabs (Hydrochlorothiazide) .... Take 1 Tab  By Mouth Every Morning 9)  Lisinopril 20 Mg Tabs (Lisinopril) .... One Daily 10)  Nitroglycerin 0.4 Mg/hr Pt24 (Nitroglycerin) .... Take One For Chest Pain, May Repeat in 5 Minutes Times Two More Doses, If Not Releif Seek Emergency Help. 1 Bottle 11)  Peg 3350  Powd (Polyethylene Glycol 3350) .... One Capful in 4 Oz Liquid Two Times A Day For 3-4 Days Then Daily, Qs  Allergies (verified): No Known Drug Allergies  Review of Systems General:  Denies fatigue, loss of appetite, sleep disorder, and sweats. CV:  Complains of lightheadness and shortness of breath with exertion; denies chest pain or discomfort, difficulty breathing while lying down, fainting, near fainting, and swelling of feet. GI:  Complains of constipation. GU:  Complains of urinary frequency and urinary hesitancy.  Physical Exam  General:  Alert, moved slowly and cautiously Lungs:  normal respiratory effort, normal breath sounds, and no crackles.   Heart:  Tachy, sounded regular, EKG rate at 133, wide QRS (old, pt with LBBB), fairly regular but unable to see p waves because of rate.  Not in flutter.   Impression & Recommendations:  Problem # 1:  ATRIAL TACHYCARDIA (ICD-427.89) A fib in hospital, pushed cardiology apt up from 2/14 to next week.  He was instructed to not overexert, to have a low threshold for calling 911 if he has signs of stroke, chest pain, or lightheadness that persists.  Increased Toprol Xr from 50 mg to 100 mg.  Added NTG or chest pain, although he did not report this symptom  (history of CAD/CABG) His updated medication list for this problem includes:    Bayer Childrens Aspirin 81 Mg Chew (Aspirin) .Marland Kitchen... Take 1 tablet by mouth once a day    Toprol Xl 100 Mg Xr24h-tab (Metoprolol succinate) .Marland Kitchen... Take one tablet daily  Orders: 12 Lead EKG (12 Lead EKG) [facility]- Est  Level 4 (78295)  Problem # 2:  HYPERTENSION, BENIGN SYSTEMIC (ICD-401.1)  Meds decreased in hospital secondary to hypotension, increased beta blocker today (BP up), and ACE from 10 mg to 20 mg ( back to previous dose). The following medications were removed from the medication list:    Lisinopril-hydrochlorothiazide 20-12.5 Mg Tabs (Lisinopril-hydrochlorothiazide) ..... One tablet by mouth daily His updated medication list for this problem includes:    Toprol Xl 100 Mg Xr24h-tab (Metoprolol succinate) .Marland Kitchen... Take one tablet daily    Hydrochlorothiazide 12.5 Mg Tabs (Hydrochlorothiazide) .Marland Kitchen... Take 1 tab  by mouth every morning    Lisinopril 20 Mg Tabs (Lisinopril) ..... One daily  Orders: [facility]- Est  Level 4 (62130)  Problem # 3:  PROSTATE CANCER (ICD-185)  Apt tomorrow with Uro, seen by oncology  this week.  Orders: [facility]- Est  Level 4 (86578)  Problem # 4:  ABUSE, ALCOHOL, IN REMISSION (ICD-305.03) Has not drank since 04/10/09 when admitted to the hospital, had shakes last week that have subsided.  Reports committment to such.  Problem # 5:  HYPOKALEMIA, HX OF (ICD-V12.2) improved, ACE increased to baseline dosage.  Problem # 6:  CONSTIPATION, CHRONIC (ICD-564.09)  Discussed importance of not straining, add PEG and discussed high fiber foods. His updated medication list for this problem includes:    Peg 3350 Powd (Polyethylene glycol 3350) ..... One capful in 4 oz liquid two times a day for 3-4 days then daily, qs  Orders: [facility]- Est  Level 4 (46962)  Problem # 7:  ANEMIA, MILD, HX OF (ICD-V12.3) Improved from acute drop during hospital stay, presumed seconary to medication (either Protonix  or Heparin)  Complete Medication List: 1)  Bayer Childrens Aspirin 81 Mg Chew (Aspirin) .... Take 1 tablet by mouth once a day 2)  Fish Oil Concentrate 1000 Mg Caps (Omega-3 fatty acids) .... Take one tablet daily 3)  Omeprazole 20 Mg Tbec (Omeprazole) .... Take one tablet daily 4)  Simvastatin 20 Mg Tabs (Simvastatin) .... Take one tablet daily at bedtime 5)  Tramadol-acetaminophen 37.5-325 Mg Tabs (Tramadol-acetaminophen) .... Take 1-2 tabs every 6 hours as needed 6)  Centrum Silver Tabs (Multiple vitamins-minerals) .... Take one tablet daily 7)  Toprol Xl 100 Mg Xr24h-tab (Metoprolol succinate) .... Take one tablet daily 8)  Hydrochlorothiazide 12.5 Mg Tabs (Hydrochlorothiazide) .... Take 1 tab  by mouth every morning 9)  Lisinopril 20 Mg Tabs (Lisinopril) .... One daily 10)  Nitroglycerin 0.4 Mg/hr Pt24 (Nitroglycerin) .... Take one for chest pain, may repeat in 5 minutes times two more doses, if  not releif seek emergency help. 1 bottle 11)  Peg 3350 Powd (Polyethylene glycol 3350) .... One capful in 4 oz liquid two times a day for 3-4 days then daily, qs  Patient Instructions: 1)  Increase you Toprol ER (Metoprolol ) to 100 mg ( 2 tabs of 50 mg) 2)  If you have chest pain unrelieved by nitroglycerine or signs of stroke call 911 immediately 3)  For constipation: increase your intake of fresh fruit and raw veges,  beans, and high fiber cereal.  Use Miralax daily (PEG powder) 4)  Dr. Kym Groom one month Prescriptions: PEG 3350  POWD (POLYETHYLENE GLYCOL 3350) one capful in 4 oz liquid two times a day for 3-4 days then daily, QS  #1 x 6   Entered and Authorized by:   Luretha Murphy NP   Signed by:   Luretha Murphy NP on 04/20/2009   Method used:   Electronically to        [facility] [address] [phone]* (retail)       [address]       [address]       [place], [place]  [address]       Ph: [phone]       Fax: [phone]   RxID:   [id number] NITROGLYCERIN 0.4 MG/HR PT24  (NITROGLYCERIN) take one for chest pain, may repeat in 5 minutes times two more doses, if not releif seek emergency help. 1 bottle  #1 x 3   Entered and Authorized by:   Luretha Murphy NP   Signed by:   Luretha Murphy NP on 04/20/2009   Method used:   Electronically to        [facility] [address] [phone]* (retail)       [address]       [address]       [place], [place]  [address]       Ph: [phone]       Fax: [phone]   RxID:   [id number] LISINOPRIL 20 MG TABS (LISINOPRIL) one daily  #90 x 3   Entered and Authorized by:   Luretha Murphy NP   Signed by:   Luretha Murphy NP on 04/20/2009   Method used:   Electronically to        [facility] [address] [phone]* (retail)       [address]       [address]       [place], [place]  [address]       Ph: [phone]       Fax: [phone]   RxID:   [id number] HYDROCHLOROTHIAZIDE 12.5 MG  TABS (HYDROCHLOROTHIAZIDE) Take 1 tab  by mouth every morning  #90 x 3   Entered and Authorized by:   Luretha Murphy NP   Signed by:   Luretha Murphy NP on 04/20/2009   Method used:   Electronically to        [facility] [address] [phone]* (retail)       [address]       [address] [place] [address]       [place], [place]  [address]       Ph: [phone]       Fax: [phone]   RxID:   [id number]

## 2010-05-01 NOTE — Miscellaneous (Signed)
Summary: BP check restart Toprol  Clinical Lists Changes  Patient comes in for BP check after having labs drawn. Since stopping Toprol his dizziness has much  improved. Only occasionally now he will feel a little off balance when  changing positions.  He has noticed however, that his tremers have worsened since being off Toprol. BP today lying 140/70 pulse 76, sitting 142/78 pulse 80, standing 142/78 pulse 80. BP was checked manually in left arm using a regular adult size cuff. Pharmacy is [place].  Will forward message to MD. Spoke with Dr. Sheffield Slider regarding this message. Theresia Lo RN  April 05, 2009 8:45 AM I called and spoke with [name] who says the lightheadedness wasn't so bac and he'd like to try the 100 mg Toprol again because he thinks he can adjust to them. He'll let me know if he can't. I told him that one liver test was slightly high and we will repeat in the future. He denies any alcohol intake.

## 2010-05-01 NOTE — Miscellaneous (Signed)
Summary: urology consult sum  Clinical Lists Changes Dr Annabell Howells referred him for vacuum device for ED.  Problems: Removed problem of UNSPECIFIED TACHYCARDIA (ICD-785.0) - Signed Removed problem of HYPOKALEMIA, HX OF (ICD-V12.2) - Signed Removed problem of HYPERBILIRUBINEMIA (ICD-782.4) - Signed Changed problem from PROSTATE CANCER (ICD-185) to PROSTATE CANCER, HX OF (ICD-V10.46) - Signed Medications: Added new medication of LEVITRA 20 MG TABS (VARDENAFIL HCL) Take as directed - Signed Orders: Added new Test order of Comp Met-[facility] [phone]) - Signed Added new Test order of Lipid-[facility] ([id number]) - Signed

## 2010-05-01 NOTE — Consult Note (Signed)
Summary: [facility]   Imported By: Roderic Ovens 04/24/2009 12:23:52  _____________________________________________________________________  External Attachment:    Type:   Image     Comment:   External Document
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
## 2010-05-01 NOTE — Consult Note (Signed)
Summary: Adult nurse [facility] @ [facility] @ [place]   Imported By: Clydell Hakim 04/27/2009 15:47:31  _____________________________________________________________________  External Attachment:    Type:   Image     Comment:   External Document

## 2010-05-01 NOTE — Assessment & Plan Note (Signed)
Summary: f/up,tcb   Vital Signs:  Patient profile:   75 year old male Weight:      185.1 pounds BMI:     25.91 Temp:     98.1 degrees F Pulse rate:   64 / minute BP sitting:   124 / 72  (right arm)  Vitals Entered By: Starleen Blue RN (October 26, 2009 8:48 AM) CC: f/u Is Patient Diabetic? No Pain Assessment Patient in pain? no        Primary Care Provider:  Zachery Dauer MD  CC:  f/u.  History of Present Illness: 3 month follow up visit.  Denies chest pain or palpatations.  Released from Cardiology, decision was made not to go ahead wtih ablation for atrial flutter.  Denies drinking alcohol since January.  Spends a lot of time watching TV, feels that his legs are getting weak and he sometimes stumbles.  Has been thinking about getting a treadmill.      Habits & Providers  Alcohol-Tobacco-Diet     Tobacco Status: quit  Current Medications (verified): 1)  Bayer Childrens Aspirin 81 Mg Chew (Aspirin) .... Take 1 Tablet By Mouth Once A Day 2)  Fish Oil Concentrate 1000 Mg  Caps (Omega-3 Fatty Acids) .... Take One Tablet Daily 3)  Simvastatin 20 Mg Tabs (Simvastatin) .... Take One Tablet Daily At Bedtime 4)  Tramadol-Acetaminophen 37.5-325 Mg  Tabs (Tramadol-Acetaminophen) .... Take 1-2 Tabs Every 6 Hours As Needed 5)  Centrum Silver   Tabs (Multiple Vitamins-Minerals) .... Take One Tablet Daily 6)  Toprol Xl 100 Mg Xr24h-Tab (Metoprolol Succinate) .... Take One Tablet Daily 7)  Hydrochlorothiazide 12.5 Mg  Tabs (Hydrochlorothiazide) .... Take 1 Tab  By Mouth Every Morning 8)  Lisinopril 10 Mg Tabs (Lisinopril) .... Take One Tablet Once Daily 9)  Nitroglycerin 0.4 Mg/hr Pt24 (Nitroglycerin) .... Take One For Chest Pain, May Repeat in 5 Minutes Times Two More Doses, If Not Releif Seek Emergency Help. 1 Bottle 10)  Peg 3350  Powd (Polyethylene Glycol 3350) .... One Capful in 4 Oz Liquid Two Times A Day For 3-4 Days Then Daily, Qs  Allergies (verified): No Known Drug  Allergies  Review of Systems General:  Denies fatigue, loss of appetite, and sleep disorder. CV:  Denies chest pain or discomfort, lightheadness, and palpitations. Resp:  Denies cough. GI:  Denies abdominal pain, constipation, and indigestion. GU:  Denies dysuria, urinary frequency, and urinary hesitancy. MS:  Complains of joint pain. Psych:  Denies anxiety and depression.  Physical Exam  General:  Alert, moderately frail, 75 year old Ears:  some cerumen impaction, hearing intact to gross exam. Mouth:  upper and lower dentures Neck:  No deformities, masses, or tenderness noted. No JVD Lungs:  normal respiratory effort and normal breath sounds.   Heart:  normal rate, regular rhythm, and no murmur.   Msk:  weak hip flexors Extremities:  No clubbing, cyanosis, edema, or deformity noted with normal full range of motion of all joints.   Neurologic:  gait normal, Romberg negative.   Skin:  Thin skin with brusing on arms Psych:  normally interactive.     Impression & Recommendations:  Problem # 1:  ATRIAL FLUTTER (ICD-427.32) Resolved, no longer drinking, alcohol free since January. His updated medication list for this problem includes:    Bayer Childrens Aspirin 81 Mg Chew (Aspirin) .Marland Kitchen... Take 1 tablet by mouth once a day    Toprol Xl 100 Mg Xr24h-tab (Metoprolol succinate) .Marland Kitchen... Take one tablet daily  His updated  medication list for this problem includes:    Bayer Childrens Aspirin 81 Mg Chew (Aspirin) .Marland Kitchen... Take 1 tablet by mouth once a day    Toprol Xl 100 Mg Xr24h-tab (Metoprolol succinate) .Marland Kitchen... Take one tablet daily  Problem # 2:  CONSTIPATION, CHRONIC (ICD-564.09)  Using prn His updated medication list for this problem includes:    Peg 3350 Powd (Polyethylene glycol 3350) ..... One capful in 4 oz liquid two times a day for 3-4 days then daily, qs  Orders: [facility]- Est  Level 4 (13244)  Problem # 3:  HYPERTENSION, BENIGN SYSTEMIC (ICD-401.1) at goal His updated  medication list for this problem includes:    Toprol Xl 100 Mg Xr24h-tab (Metoprolol succinate) .Marland Kitchen... Take one tablet daily    Hydrochlorothiazide 12.5 Mg Tabs (Hydrochlorothiazide) .Marland Kitchen... Take 1 tab  by mouth every morning    Lisinopril 10 Mg Tabs (Lisinopril) .Marland Kitchen... Take one tablet once daily  Orders: Comp Met-[facility] (01027-25366)  Problem # 4:  CONGESTIVE HEART FAILURE UNSPECIFIED (ICD-428.0)  No signs of such to day.  Discussed building his reserve and endurance through exercise. His updated medication list for this problem includes:    Bayer Childrens Aspirin 81 Mg Chew (Aspirin) .Marland Kitchen... Take 1 tablet by mouth once a day    Toprol Xl 100 Mg Xr24h-tab (Metoprolol succinate) .Marland Kitchen... Take one tablet daily    Hydrochlorothiazide 12.5 Mg Tabs (Hydrochlorothiazide) .Marland Kitchen... Take 1 tab  by mouth every morning    Lisinopril 10 Mg Tabs (Lisinopril) .Marland Kitchen... Take one tablet once daily  Orders: [facility]- Est  Level 4 (44034)  Complete Medication List: 1)  Bayer Childrens Aspirin 81 Mg Chew (Aspirin) .... Take 1 tablet by mouth once a day 2)  Fish Oil Concentrate 1000 Mg Caps (Omega-3 fatty acids) .... Take one tablet daily 3)  Simvastatin 20 Mg Tabs (Simvastatin) .... Take one tablet daily at bedtime 4)  Tramadol-acetaminophen 37.5-325 Mg Tabs (Tramadol-acetaminophen) .... Take 1-2 tabs every 6 hours as needed 5)  Centrum Silver Tabs (Multiple vitamins-minerals) .... Take one tablet daily 6)  Toprol Xl 100 Mg Xr24h-tab (Metoprolol succinate) .... Take one tablet daily 7)  Hydrochlorothiazide 12.5 Mg Tabs (Hydrochlorothiazide) .... Take 1 tab  by mouth every morning 8)  Lisinopril 10 Mg Tabs (Lisinopril) .... Take one tablet once daily 9)  Nitroglycerin 0.4 Mg/hr Pt24 (Nitroglycerin) .... Take one for chest pain, may repeat in 5 minutes times two more doses, if not releif seek emergency help. 1 bottle 10)  Peg 3350 Powd (Polyethylene glycol 3350) .... One capful in 4 oz liquid two times a day for 3-4 days then  daily, qs  Other Orders: Direct LDL-[facility] ([id number])  Patient Instructions: 1)  Please schedule a follow-up appointment in 3 months .  2)  Begin a form or exercise to streghten legs and build stamina; walking or stationary bike.    Prevention & Chronic Care Immunizations   Influenza vaccine: Fluvax MCR  (03/09/2009)   Influenza vaccine due: 01/14/2009    Tetanus booster: 03/02/2003: Done.   Tetanus booster due: 03/01/2013    Pneumococcal vaccine: Done.  (05/30/2004)   Pneumococcal vaccine due: None    H. zoster vaccine: 04/25/2008: given  Colorectal Screening   Hemoccult: normal  (03/20/2009)   Hemoccult action/deferral: Ordered  (03/09/2009)   Hemoccult due: 03/20/2010    Colonoscopy: Not documented   Colonoscopy due: Not Indicated  Other Screening   PSA: followed regularly by Dr. Annabell Howells  (08/18/2008)   PSA due due: 08/18/2009   Smoking  status: quit  (10/26/2009)  Lipids   Total Cholesterol: 180  (03/09/2009)   LDL: 72  (03/09/2009)   LDL Direct: 83  (10/27/2006)   HDL: 87  (03/09/2009)   Triglycerides: 106  (03/09/2009)    SGOT (AST): 60  (04/05/2009)   SGPT (ALT): 42  (04/05/2009) CMP ordered    Alkaline phosphatase: 84  (04/05/2009)   Total bilirubin: 0.9  (04/05/2009)    Lipid flowsheet reviewed?: Yes   Progress toward LDL goal: At goal  Hypertension   Last Blood Pressure: 124 / 72  (10/26/2009)   Serum creatinine: 1.09  (04/17/2009)   Serum potassium 3.8  (04/17/2009) CMP ordered     Hypertension flowsheet reviewed?: Yes   Progress toward BP goal: At goal  Self-Management Support :   Personal Goals (by the next clinic visit) :      Personal blood pressure goal: 140/90  (03/09/2009)     Personal LDL goal: 70  (03/09/2009)    Hypertension self-management support: Not documented    Lipid self-management support: Not documented

## 2010-05-01 NOTE — Consult Note (Signed)
Summary: [facility]  [facility]   Imported By: Bradly Bienenstock 05/05/2009 12:53:49  _____________________________________________________________________  External Attachment:    Type:   Image     Comment:   External Document

## 2010-05-01 NOTE — Miscellaneous (Signed)
Summary: CHF ECHO  Clinical Lists Changes  Problems: Changed problem from CONGESTIVE HEART FAILURE UNSPECIFIED (ICD-428.0) to CHRONIC DIASTOLIC HEART FAILURE (ICD-428.32)

## 2010-05-01 NOTE — Progress Notes (Signed)
Summary: Rx Metoprol changed to 100 mg  Phone Note Call from Patient Call back at [facility] Phone [phone]   Caller: Patient Summary of Call: When he got out of the hospital they upped his Toporal to taking 1 2xs a day.  And his rx was for 50mg s 1 1xs a day.  So he needs to get a new rx for the change in dosage.  Pt uses [facility].  Can we let him know what to do. Initial call taken by: Clydell Hakim,  May 04, 2009 4:13 PM  Follow-up for Phone Call        will forward to Dr. Sheffield Slider.  patient states has been taking  Toprol 50 mg two tablets once daily .  [facility]. Follow-up by: Theresia Lo RN,  May 04, 2009 4:49 PM    New/Updated Medications: TOPROL XL 100 MG XR24H-TAB (METOPROLOL SUCCINATE) Take one tablet daily Prescriptions: TOPROL XL 100 MG XR24H-TAB (METOPROLOL SUCCINATE) Take one tablet daily  #30 x 5   Entered and Authorized by:   Zachery Dauer MD   Signed by:   Zachery Dauer MD on 05/04/2009   Method used:   Electronically to        [facility] [phone]* (retail)       [address]       [address]       [place], [place]  [address]       Ph: [phone]       Fax: [phone]   RxID:   [id number]
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
## 2010-05-01 NOTE — Letter (Signed)
Summary: Results Follow-up Letter  [facility]  [address]   [place], [place] [address]   Phone: [phone]  Fax: [phone]    12/15/2009  [address] [place], [place]  [address]  Dear [name],   The following are the results of your recent test(s): Good news! Your blood count and other tests are nearly back to normal. Continue avoiding alcohol and they should continue improving.  Patient: [name]  Tests: (1) CBC with Diff (10010)   WBC                       4.6 K/uL                    4.0-10.5   RBC                  [L]  3.53 MIL/uL                 4.22-5.81   Hemoglobin           [L]  12.1 g/dL                   96.2-95.2   Hematocrit           [L]  35.2 %                      39.0-52.0   MCV                       99.7 fL                     78.0-100.0 ! MCH                  [H]  34.3 pg                     26.0-34.0   MCHC                      34.4 g/dL                   84.1-32.4   RDW                       12.8 %                      11.5-15.5   Platelet Count            156 K/uL                    150-400   Granulocyte %             61 %                        43-77   Absolute Gran             2.8 K/uL                    1.7-7.7   Lymph %                   27 %                        12-46  Absolute Lymph            1.2 K/uL                    0.7-4.0   Mono %                    10 %                        3-12   Absolute Mono             0.5 K/uL                    0.1-1.0   Eos %                     1 %                         0-5   Absolute Eos              0.1 K/uL                    0.0-0.7   Baso %                    0 %                         0-1   Absolute Baso             0.0 K/uL                    0.0-0.1  Tests: (2) Basic Metabolic Panel ([address])   Sodium               [L]  130 mEq/L                   135-145   Potassium                 4.2 mEq/L                   3.5-5.3   Chloride             [L]  93 mEq/L                     96-112   CO2                       26 mEq/L                    19-32   Glucose                   95 mg/dL                    60-45   BUN                       8 mg/dL                     4-09   Creatinine                0.93 mg/dL  0.40-1.50   Calcium                   9.3 mg/dL                   6.0-45.4  Document Creation Date: 12/14/2009 10:08 PM _______________________________________________________________________  Sincerely,  Zachery Dauer MD [facility]            Appended Document: Results Follow-up Letter mailed

## 2010-05-01 NOTE — Miscellaneous (Signed)
Summary: Orders Update  Clinical Lists Changes  Problems: Added new problem of HYPOKALEMIA, HX OF (ICD-V12.2) Orders: Added new Test order of Basic Met-[facility] [phone]) - Signed Added new Test order of CBC-[facility] (14782) - Signed

## 2010-05-01 NOTE — Assessment & Plan Note (Signed)
Summary: f/up,tcb   Vital Signs:  Patient profile:   75 year old male Height:      71 inches Weight:      182 pounds BMI:     25.48 Temp:     97.8 degrees F oral Pulse rate:   78 / minute Pulse rhythm:   regular BP sitting:   120 / 83  (left arm) Cuff size:   regular  Vitals Entered By: Loralee Pacas CMA (December 14, 2009 8:49 AM) CC: follow-up visit   Primary Care Provider:  Zachery Dauer MD  CC:  follow-up visit.  History of Present Illness: 75 yo here for follow-up of chronic medical issues.  Was admitted Aug 12-13 for atrial fibrillation associated with hypotension.  Was found to be pancytopenic during this hospitalization.  Back pain:  at baseline, better when he sits.  No radiation of pain or numbness.   Able to walk a block or more- unsure as he does notpush his limits.  Decreased exercise tolerance- but no SOB, focal weakness.  No aerobic exercise- tries to do crunches and isometric exercises.  Atrial fluter: Patient denies palpitations but feels occasional "skipped beats".  Has been taking metoprolol.  Habits & Providers  Alcohol-Tobacco-Diet     Tobacco Status: quit     Year Quit: 1990  Current Medications (verified): 1)  Bayer Childrens Aspirin 81 Mg Chew (Aspirin) .... Take 1 Tablet By Mouth Once A Day 2)  Fish Oil 1200 Mg Caps (Omega-3 Fatty Acids) .... Take One Tablet Daily 3)  Simvastatin 20 Mg Tabs (Simvastatin) .... Take One Tablet Daily At Bedtime 4)  Tramadol-Acetaminophen 37.5-325 Mg  Tabs (Tramadol-Acetaminophen) .... Take 1-2 Tabs Every 6 Hours As Needed 5)  Centrum Silver   Tabs (Multiple Vitamins-Minerals) .... Take One Tablet Daily 6)  Toprol Xl 100 Mg Xr24h-Tab (Metoprolol Succinate) .... Take One Tablet Daily 7)  Hydrochlorothiazide 12.5 Mg  Tabs (Hydrochlorothiazide) .... Take 1 Tab  By Mouth Every Morning 8)  Lisinopril 20 Mg Tabs (Lisinopril) .... Take One Tablet Daily 9)  Nitroglycerin 0.4 Mg/hr Pt24 (Nitroglycerin) .... Take One For Chest  Pain, May Repeat in 5 Minutes Times Two More Doses, If Not Releif Seek Emergency Help. 1 Bottle 10)  Peg 3350  Powd (Polyethylene Glycol 3350) .... One Capful in 4 Oz Liquid Two Times A Day For 3-4 Days Then Daily, Qs  Allergies (verified): No Known Drug Allergies PMH-FH-SH reviewed for relevance  Review of Systems      See HPI  Physical Exam  General:  Alert, NAD. Nose:  mild rhinophyma Lungs:  CTAB, transmitted upper airway sounds.  No wheeze, rhonchi, rale, crackle. Heart:  Irregularly irregular, s1/s2 heard.  no MRG, no JVD seen. Abdomen:  Soft, NT/ND/ +BS Msk:  Able to walk on heels and toes with minimal difficulty, good balance.    Gait is narrow based, symmetric arm swing, no path deviation.  Right foot everted.  No pain.    Back forward bending doesn't reverse curve, tight lumbar para spinal muscles. Extremities:  1+ edema Left, trace edema on right. Neurologic:  No pronator drift, normal finger to nose. Romberg steady  Psych:  normally interactive.     Impression & Recommendations:  Problem # 1:  ATRIAL FIBRILLATION (ICD-427.31)  rate controlled.  irregular rhythm today.  Will continue Metoprolol 100 mg XL.  His updated medication list for this problem includes:    Bayer Childrens Aspirin 81 Mg Chew (Aspirin) .Marland Kitchen... Take 1 tablet by mouth once a  day    Toprol Xl 100 Mg Xr24h-tab (Metoprolol succinate) .Marland Kitchen... Take one tablet daily  Problem # 2:  BACK PAIN, LOW (ICD-724.2)  Discussed core strengthening exercises and increasing exercise tolerance.  Pain not limiting exercise.  Continue current pain management.  His updated medication list for this problem includes:    Bayer Childrens Aspirin 81 Mg Chew (Aspirin) .Marland Kitchen... Take 1 tablet by mouth once a day    Tramadol-acetaminophen 37.5-325 Mg Tabs (Tramadol-acetaminophen) .Marland Kitchen... Take 1-2 tabs every 6 hours as needed  Orders: [facility]- Est  Level 4 ([address])  Problem # 3:  ABUSE, ALCOHOL, IN REMISSION  (ICD-305.03)  Discussed risks of alcohol to his health.  Patient counseled on increased socialization with good influences, senior recreation center to help prevent relapse.  Orders: [facility]- Est  Level 4 ([address])  Problem # 4:  ANEMIA, MILD, HX OF (ICD-V12.3) Notes to be pancytopenic during hospitlization.  WIll check CBC with DIff today to monitor resolution. Orders: CBC w/Diff-[facility] (09811) [facility]- Est  Level 4 (91478)  Complete Medication List: 1)  Bayer Childrens Aspirin 81 Mg Chew (Aspirin) .... Take 1 tablet by mouth once a day 2)  Fish Oil 1200 Mg Caps (Omega-3 fatty acids) .... Take one tablet daily 3)  Simvastatin 20 Mg Tabs (Simvastatin) .... Take one tablet daily at bedtime 4)  Tramadol-acetaminophen 37.5-325 Mg Tabs (Tramadol-acetaminophen) .... Take 1-2 tabs every 6 hours as needed 5)  Centrum Silver Tabs (Multiple vitamins-minerals) .... Take one tablet daily 6)  Toprol Xl 100 Mg Xr24h-tab (Metoprolol succinate) .... Take one tablet daily 7)  Hydrochlorothiazide 12.5 Mg Tabs (Hydrochlorothiazide) .... Take 1 tab  by mouth every morning 8)  Lisinopril 20 Mg Tabs (Lisinopril) .... Take one tablet daily 9)  Nitroglycerin 0.4 Mg/hr Pt24 (Nitroglycerin) .... Take one for chest pain, may repeat in 5 minutes times two more doses, if not releif seek emergency help. 1 bottle 10)  Peg 3350 Powd (Polyethylene glycol 3350) .... One capful in 4 oz liquid two times a day for 3-4 days then daily, qs  Other Orders: Basic Met-[facility] ([id number])  Patient Instructions: 1)  Continue to do crunches daily to keep up your strength. 2)  Consider usuing a walking stick or cane to help you with uneven ground to help with balance. 3)  It is important to not drink alcohol. 4)  You have decided to walk 15-20 minutes for 5 days a week as your exercise goal. 5)  Follow-up in 3 months.    Prevention & Chronic Care Immunizations   Influenza vaccine: Fluvax MCR  (03/09/2009)   Influenza vaccine due:  01/14/2009    Tetanus booster: 03/02/2003: Done.   Tetanus booster due: 03/01/2013    Pneumococcal vaccine: Done.  (05/30/2004)   Pneumococcal vaccine due: None    H. zoster vaccine: 04/25/2008: given  Colorectal Screening   Hemoccult: normal  (03/20/2009)   Hemoccult action/deferral: Ordered  (03/09/2009)   Hemoccult due: 03/20/2010    Colonoscopy: Not documented   Colonoscopy due: Not Indicated  Other Screening   PSA: followed regularly by Dr. Annabell Howells  (08/18/2008)   PSA due due: 08/18/2009   Smoking status: quit  (12/14/2009)  Lipids   Total Cholesterol: 180  (03/09/2009)   LDL: 40  (11/10/2009)   LDL Direct: 53  (10/26/2009)   HDL: 74  (11/10/2009)   Triglycerides: 106  (03/09/2009)    SGOT (AST): 69  (11/10/2009)   SGPT (ALT): 52  (11/10/2009)   Alkaline phosphatase: 60  (11/10/2009)  Total bilirubin: 2.2  (11/10/2009)    Lipid flowsheet reviewed?: Yes   Progress toward LDL goal: At goal  Hypertension   Last Blood Pressure: 120 / 83  (12/14/2009)   Serum creatinine: 0.86  (11/10/2009)   Serum potassium 3.5  (11/10/2009)    Hypertension flowsheet reviewed?: Yes   Progress toward BP goal: At goal  Self-Management Support :   Personal Goals (by the next clinic visit) :      Personal blood pressure goal: 140/90  (03/09/2009)     Personal LDL goal: 70  (03/09/2009)    Hypertension self-management support: Not documented    Lipid self-management support: Not documented    -  Date:  11/10/2009    LDL 40    HDL 74    TG 107    TSH 0.915
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
## 2010-05-03 NOTE — Letter (Signed)
Summary: Results Follow-up Letter  [facility]  [address]   [place], [place] [address]   Phone: [phone]  Fax: [phone]    04/25/2010  [address] [place], [place]  [address]  Dear [name],   The following are the results of your recent test(s): Patient: [name] Continued signs of liver irritation. Remember that any alcohol can continue this and lead to cirrhosis and liver failure.  Tests: (1) CMP with Estimated GFR (2402)   Sodium               [L]  131 mEq/L                   135-145   Potassium                 3.8 mEq/L                   3.5-5.3   Chloride             [L]  91 mEq/L                    96-112   CO2                       25 mEq/L                    19-32   Glucose                   93 mg/dL                    57-84   BUN                       14 mg/dL                    6-96   Creatinine                0.91 mg/dL                  0.40-1.50   Bilirubin, Total     [H]  2.0 mg/dL                   2.9-5.2   Alkaline Phosphatase      64 U/L                      39-117   AST/SGOT             [H]  42 U/L                      0-37   ALT/SGPT                  34 U/L                      0-53   Total Protein             6.9 g/dL                    8.4-1.3   Albumin                   4.3 g/dL  3.5-5.2   Calcium                   9.5 mg/dL                   9.8-11.9 ! Est GFR, African American                             >60 mL/min                  >60 ! Est GFR, NonAfrican American                             >60 mL/min                  >60 You continue to have a mild anemia Tests: (2) CBC NO Diff (Complete Blood Count) (10000)   WBC                       4.8 K/uL                    4.0-10.5   RBC                  [L]  3.68 MIL/uL                 4.22-5.81   Hemoglobin           [L]  12.4 g/dL                   14.7-82.9   Hematocrit           [L]  36.7 %                      39.0-52.0   MCV                        99.7 fL                     78.0-100.0 ! MCH                       33.7 pg                     26.0-34.0   MCHC                      33.8 g/dL                   56.2-13.0   RDW                       12.8 %                      11.5-15.5   Platelet Count       [L]  144 K/uL                    150-400 No vitamin deficiency. Tests: (3) Vitamin B12 (86578)   Vitamin B12               860 pg/mL                   211-911  Tests: (4) Folate ([address])   Folate                    >20.0 ng/mL                   Normal:              > 5.4 ng/mL Document Creation Date: 04/25/2010 2:46 AM  See you in 3 months. Practice your balance! Sincerely, Zachery Dauer MD [facility]           Appended Document: Results Follow-up Letter mailed

## 2010-05-03 NOTE — Assessment & Plan Note (Signed)
Summary: f/u eo   Vital Signs:  Patient profile:   75 year old male Height:      71 inches Weight:      183 pounds BMI:     25.62 Temp:     98.5 degrees F oral Pulse rate:   68 / minute BP sitting:   134 / 80  (left arm) Cuff size:   regular  Vitals Entered By: Tessie Fass CMA (April 24, 2010 1:44 PM) CC: F/U Is Patient Diabetic? No Pain Assessment Patient in pain? no        Primary Care Provider:  Zachery Dauer MD  CC:  F/U.  History of Present Illness: Constipation - requires PEG once weekly  A. Fib. - no palpitations or chest pain.   Back pain - takes Tramadol 4-5 times daily. Pain never radiates to his legs.   Unstable gait - Fell into the bathtub one week ago contusing his right elbow. No head injury. Denies drinking alcohol. Often feels off balance, but doesn't use an assistive device  CAD - no chest pain or sublingual nitroglycerine  use.   Impotence - no partner, using a vacuum pump which is enlarging his penis.   Habits & Providers  Alcohol-Tobacco-Diet     Tobacco Status: quit  Allergies: No Known Drug Allergies  Physical Exam  General:  Alert, NAD.Very unstable getting up onto the exam table Lungs:  CTAB, transmitted upper airway sounds.  No wheeze, rhonchi, rale, crackle. Heart:  Irregularly irregular, s1/s2 heard.  no MRG, no JVD seen. Abdomen:  Soft, NT/ND/ +BSno hepatomegaly.   Msk:  left elbow normal range of motion  Extremities:  No clubbing, cyanosis, edema, or deformity noted with normal full range of motion of all joints.   Neurologic:  alert & oriented X3.  Mild postural tremor. FTN, RAM, HTS all normal. Gait is wide based and mildly ataxic Skin:  Ecchymosis with central skin tear several centimeters above and below the left elbow. Psych:  normally interactive.  not anxious appearing and not depressed appearing.     Impression & Recommendations:  Problem # 1:  ATRIAL FIBRILLATION (ICD-427.31) rate controlled His updated  medication list for this problem includes:    Bayer Childrens Aspirin 81 Mg Chew (Aspirin) .Marland Kitchen... Take 1 tablet by mouth once a day    Metoprolol Succinate 100 Mg Xr24h-tab (Metoprolol succinate) .Marland Kitchen... Take one tab once daily  Orders: [facility]- Est  Level 4 (16109)  Problem # 2:  ABNORMALITY OF GAIT (ICD-781.2)  Problem # 3:  WEAKNESS (ICD-780.79) Getting little exercise  Problem # 4:  ABUSE, ALCOHOL, IN REMISSION (ICD-305.03) Concern for continued alcohol intake  Problem # 5:  HYPONATREMIA, MILD (ICD-276.1) recheck  Problem # 6:  CHRONIC DIASTOLIC HEART FAILURE (ICD-428.32)  His updated medication list for this problem includes:    Bayer Childrens Aspirin 81 Mg Chew (Aspirin) .Marland Kitchen... Take 1 tablet by mouth once a day    Metoprolol Succinate 100 Mg Xr24h-tab (Metoprolol succinate) .Marland Kitchen... Take one tab once daily    Hydrochlorothiazide 12.5 Mg Tabs (Hydrochlorothiazide) .Marland Kitchen... Take 1 tab  by mouth every morning    Lisinopril 20 Mg Tabs (Lisinopril) .Marland Kitchen... Take one tablet daily  Complete Medication List: 1)  Bayer Childrens Aspirin 81 Mg Chew (Aspirin) .... Take 1 tablet by mouth once a day 2)  Fish Oil 1200 Mg Caps (Omega-3 fatty acids) .... Take one tablet daily 3)  Simvastatin 20 Mg Tabs (Simvastatin) .... Take one tablet daily at bedtime 4)  Tramadol-acetaminophen 37.5-325 Mg Tabs (Tramadol-acetaminophen) .... Take 1-2 tabs every 6 hours as needed 5)  Centrum Silver Tabs (Multiple vitamins-minerals) .... Take one tablet daily 6)  Metoprolol Succinate 100 Mg Xr24h-tab (Metoprolol succinate) .... Take one tab once daily 7)  Hydrochlorothiazide 12.5 Mg Tabs (Hydrochlorothiazide) .... Take 1 tab  by mouth every morning 8)  Lisinopril 20 Mg Tabs (Lisinopril) .... Take one tablet daily 9)  Nitroglycerin 0.4 Mg/hr Pt24 (Nitroglycerin) .... Take one for chest pain, may repeat in 5 minutes times two more doses, if not releif seek emergency help. 1 bottle 10)  Peg 3350 Powd (Polyethylene glycol 3350)  .... One capful in 4 oz liquid two times a day for 3-4 days then daily, qs  Other Orders: Folate-[facility] [id number]) Comp Met-[facility] [phone]) B12-[facility] [phone]) CBC-[facility] (30160) Influenza Vaccine MCR [phone])  Patient Instructions: 1)  Please schedule a follow-up appointment in 3 months .  2)  Good to see you today. Gradually increase your walking. Eat a balanced diet. Continue to avoid alcohol 3)  I'll call if your lab tests are abnormal.   Orders Added: 1)  [facility]- Est  Level 4 [99214] 2)  Folate-[facility] [[id number]] 3)  Comp Met-[facility] [[id number]] 4)  B12-[facility] [[id number]] 5)  CBC-[facility] [85027] 6)  Influenza Vaccine MCR [00025]   Immunizations Administered:  Influenza Vaccine # 1:    Vaccine Type: Fluvax MCR    Site: left deltoid    Mfr: GlaxoSmithKline    Dose: 0.5 ml    Route: IM    Given by: Tessie Fass CMA    Exp. Date: 09/29/2010    Lot #: [id number]    VIS given: 10/24/09 version given April 24, 2010.  Flu Vaccine Consent Questions:    Do you have a history of severe allergic reactions to this vaccine? no    Any prior history of allergic reactions to egg and/or gelatin? no    Do you have a sensitivity to the preservative Thimersol? no    Do you have a past history of Guillan-Barre Syndrome? no    Do you currently have an acute febrile illness? no    Have you ever had a severe reaction to latex? no    Vaccine information given and explained to patient? yes   Immunizations Administered:  Influenza Vaccine # 1:    Vaccine Type: Fluvax MCR    Site: left deltoid    Mfr: GlaxoSmithKline    Dose: 0.5 ml    Route: IM    Given by: Tessie Fass CMA    Exp. Date: 09/29/2010    Lot #: [id number]    VIS given: 10/24/09 version given April 24, 2010.    Prevention & Chronic Care Immunizations   Influenza vaccine: Fluvax MCR  (04/24/2010)   Influenza vaccine due: 01/14/2009    Tetanus booster: 03/02/2003: Done.   Tetanus booster due: 03/01/2013     Pneumococcal vaccine: Done.  (05/30/2004)   Pneumococcal vaccine due: None    H. zoster vaccine: 04/25/2008: given  Colorectal Screening   Hemoccult: normal  (03/20/2009)   Hemoccult action/deferral: Ordered  (03/09/2009)   Hemoccult due: 03/20/2010    Colonoscopy: Not documented   Colonoscopy due: Not Indicated  Other Screening   PSA: followed regularly by Dr. Annabell Howells  (08/18/2008)   PSA due due: 08/18/2009   Smoking status: quit  (04/24/2010)  Lipids   Total Cholesterol: 180  (03/09/2009)   LDL: 40  (11/10/2009)   LDL Direct: 53  (10/26/2009)   HDL: 74  (11/10/2009)  Triglycerides: 106  (03/09/2009)    SGOT (AST): 69  (11/10/2009)   SGPT (ALT): 52  (11/10/2009) CMP ordered    Alkaline phosphatase: 60  (11/10/2009)   Total bilirubin: 2.2  (11/10/2009)    Lipid flowsheet reviewed?: Yes   Progress toward LDL goal: Unchanged  Hypertension   Last Blood Pressure: 134 / 80  (04/24/2010)   Serum creatinine: 0.93  (12/14/2009)   Serum potassium 4.2  (12/14/2009) CMP ordered     Hypertension flowsheet reviewed?: Yes   Progress toward BP goal: At goal  Self-Management Support :   Personal Goals (by the next clinic visit) :      Personal blood pressure goal: 140/90  (03/09/2009)     Personal LDL goal: 70  (03/09/2009)    Hypertension self-management support: Not documented    Lipid self-management support: Not documented

## 2010-05-08 ENCOUNTER — Emergency Department (HOSPITAL_COMMUNITY)
Admission: EM | Admit: 2010-05-08 | Discharge: 2010-05-08 | Disposition: A | Discharge status: Home / Self Care | Payer: MEDICARE | Attending: Emergency Medicine | Admitting: Emergency Medicine

## 2010-05-08 ENCOUNTER — Emergency Department (HOSPITAL_COMMUNITY): Payer: MEDICARE

## 2010-05-08 DIAGNOSIS — R45851 Suicidal ideations: Secondary | ICD-10-CM | POA: Insufficient documentation

## 2010-05-08 DIAGNOSIS — Z951 Presence of aortocoronary bypass graft: Secondary | ICD-10-CM | POA: Insufficient documentation

## 2010-05-08 DIAGNOSIS — K219 Gastro-esophageal reflux disease without esophagitis: Secondary | ICD-10-CM | POA: Insufficient documentation

## 2010-05-08 DIAGNOSIS — I252 Old myocardial infarction: Secondary | ICD-10-CM | POA: Insufficient documentation

## 2010-05-08 DIAGNOSIS — F1010 Alcohol abuse, uncomplicated: Secondary | ICD-10-CM | POA: Insufficient documentation

## 2010-05-08 DIAGNOSIS — I10 Essential (primary) hypertension: Secondary | ICD-10-CM | POA: Insufficient documentation

## 2010-05-08 LAB — RAPID URINE DRUG SCREEN, HOSP PERFORMED
Amphetamines: NOT DETECTED
Barbiturates: NOT DETECTED
Benzodiazepines: NOT DETECTED
Cocaine: NOT DETECTED
Opiates: NOT DETECTED

## 2010-05-08 LAB — DIFFERENTIAL
Lymphocytes Relative: 35 % (ref 12–46)
Lymphs Abs: 1.4 10*3/uL (ref 0.7–4.0)
Neutrophils Relative %: 57 % (ref 43–77)

## 2010-05-08 LAB — COMPREHENSIVE METABOLIC PANEL
BUN: 6 mg/dL (ref 6–23)
Calcium: 9.1 mg/dL (ref 8.4–10.5)
Creatinine, Ser: 0.85 mg/dL (ref 0.4–1.5)
Glucose, Bld: 90 mg/dL (ref 70–99)
Total Protein: 6.5 g/dL (ref 6.0–8.3)

## 2010-05-08 LAB — CBC
HCT: 37.1 % — ABNORMAL LOW (ref 39.0–52.0)
MCV: 97.9 fL (ref 78.0–100.0)
Platelets: 147 10*3/uL — ABNORMAL LOW (ref 150–400)
RBC: 3.79 MIL/uL — ABNORMAL LOW (ref 4.22–5.81)
WBC: 3.9 10*3/uL — ABNORMAL LOW (ref 4.0–10.5)

## 2010-05-15 ENCOUNTER — Other Ambulatory Visit: Payer: Self-pay | Admitting: Family Medicine

## 2010-05-15 NOTE — Telephone Encounter (Signed)
Please review and refill

## 2010-06-11 ENCOUNTER — Encounter: Payer: Self-pay | Admitting: Home Health Services

## 2010-06-13 ENCOUNTER — Other Ambulatory Visit: Payer: Self-pay | Admitting: Family Medicine

## 2010-06-13 NOTE — Telephone Encounter (Signed)
Refill request

## 2010-06-15 LAB — CBC
HCT: 29.9 % — ABNORMAL LOW (ref 39.0–52.0)
Hemoglobin: 10.2 g/dL — ABNORMAL LOW (ref 13.0–17.0)
MCH: 34 pg (ref 26.0–34.0)
MCHC: 34.1 g/dL (ref 30.0–36.0)
MCV: 96.9 fL (ref 78.0–100.0)
MCV: 99.7 fL (ref 78.0–100.0)
Platelets: 118 10*3/uL — ABNORMAL LOW (ref 150–400)
Platelets: 93 10*3/uL — ABNORMAL LOW (ref 150–400)
RBC: 3 MIL/uL — ABNORMAL LOW (ref 4.22–5.81)
RBC: 3.53 MIL/uL — ABNORMAL LOW (ref 4.22–5.81)
RDW: 12.1 % (ref 11.5–15.5)
WBC: 2.5 10*3/uL — ABNORMAL LOW (ref 4.0–10.5)
WBC: 3.7 10*3/uL — ABNORMAL LOW (ref 4.0–10.5)

## 2010-06-15 LAB — LIPID PANEL
Cholesterol: 135 mg/dL (ref 0–200)
HDL: 74 mg/dL (ref >39)
LDL Cholesterol: 40 mg/dL (ref 0–99)
Total CHOL/HDL Ratio: 1.8 RATIO
Triglycerides: 107 mg/dL (ref <150)
VLDL: 21 mg/dL (ref 0–40)

## 2010-06-15 LAB — BASIC METABOLIC PANEL
BUN: 5 mg/dL — ABNORMAL LOW (ref 6–23)
BUN: 7 mg/dL (ref 6–23)
CO2: 27 mEq/L (ref 19–32)
CO2: 28 mEq/L (ref 19–32)
Calcium: 8.5 mg/dL (ref 8.4–10.5)
Chloride: 92 mEq/L — ABNORMAL LOW (ref 96–112)
Chloride: 92 mEq/L — ABNORMAL LOW (ref 96–112)
Creatinine, Ser: 0.75 mg/dL (ref 0.4–1.5)
Creatinine, Ser: 0.78 mg/dL (ref 0.4–1.5)
GFR calc Af Amer: >60 mL/min (ref >60)
GFR calc Af Amer: >60 mL/min (ref >60)
GFR calc non Af Amer: >60 mL/min (ref >60)
Glucose, Bld: 89 mg/dL (ref 70–99)
Potassium: 3.3 mEq/L — ABNORMAL LOW (ref 3.5–5.1)
Sodium: 132 mEq/L — ABNORMAL LOW (ref 135–145)

## 2010-06-15 LAB — COMPREHENSIVE METABOLIC PANEL
ALT: 36 U/L (ref 0–53)
AST: 42 U/L — ABNORMAL HIGH (ref 0–37)
Albumin: 3.1 g/dL — ABNORMAL LOW (ref 3.5–5.2)
Alkaline Phosphatase: 58 U/L (ref 39–117)
BUN: 6 mg/dL (ref 6–23)
CO2: 27 mEq/L (ref 19–32)
Calcium: 8.4 mg/dL (ref 8.4–10.5)
Chloride: 96 mEq/L (ref 96–112)
Creatinine, Ser: 0.8 mg/dL (ref 0.4–1.5)
GFR calc Af Amer: >60 mL/min (ref >60)
GFR calc non Af Amer: >60 mL/min (ref >60)
Glucose, Bld: 134 mg/dL — ABNORMAL HIGH (ref 70–99)
Potassium: 3.2 mEq/L — ABNORMAL LOW (ref 3.5–5.1)
Sodium: 131 mEq/L — ABNORMAL LOW (ref 135–145)
Total Bilirubin: 3.1 mg/dL — ABNORMAL HIGH (ref 0.3–1.2)
Total Protein: 5.4 g/dL — ABNORMAL LOW (ref 6.0–8.3)

## 2010-06-15 LAB — ETHANOL: Alcohol, Ethyl (B): <5 mg/dL (ref 0–10)

## 2010-06-15 LAB — PROTIME-INR
INR: 0.94 (ref 0.00–1.49)
Prothrombin Time: 12.8 seconds (ref 11.6–15.2)

## 2010-06-15 LAB — CARDIAC PANEL(CRET KIN+CKTOT+MB+TROPI)
CK, MB: 0.8 ng/mL (ref 0.3–4.0)
CK, MB: 1 ng/mL (ref 0.3–4.0)
Relative Index: INVALID (ref 0.0–2.5)
Relative Index: INVALID (ref 0.0–2.5)
Total CK: 24 U/L (ref 7–232)
Total CK: 26 U/L (ref 7–232)
Troponin I: 0.01 ng/mL (ref 0.00–0.06)
Troponin I: 0.02 ng/mL (ref 0.00–0.06)

## 2010-06-15 LAB — TSH: TSH: 0.915 u[IU]/mL (ref 0.350–4.500)

## 2010-06-15 LAB — MAGNESIUM: Magnesium: 1.5 mg/dL (ref 1.5–2.5)

## 2010-06-15 LAB — BRAIN NATRIURETIC PEPTIDE: Pro B Natriuretic peptide (BNP): 323 pg/mL — ABNORMAL HIGH (ref 0.0–100.0)

## 2010-06-17 LAB — BASIC METABOLIC PANEL
BUN: 6 mg/dL (ref 6–23)
CO2: 25 mEq/L (ref 19–32)
CO2: 26 mEq/L (ref 19–32)
Calcium: 7.9 mg/dL — ABNORMAL LOW (ref 8.4–10.5)
Calcium: 8.4 mg/dL (ref 8.4–10.5)
Calcium: 8.6 mg/dL (ref 8.4–10.5)
Chloride: 97 mEq/L (ref 96–112)
Creatinine, Ser: 0.85 mg/dL (ref 0.4–1.5)
Creatinine, Ser: 0.96 mg/dL (ref 0.4–1.5)
Creatinine, Ser: 1.02 mg/dL (ref 0.4–1.5)
GFR calc Af Amer: >60 mL/min (ref >60)
GFR calc Af Amer: >60 mL/min (ref >60)
GFR calc non Af Amer: >60 mL/min (ref >60)
GFR calc non Af Amer: >60 mL/min (ref >60)
GFR calc non Af Amer: >60 mL/min (ref >60)
Glucose, Bld: 104 mg/dL — ABNORMAL HIGH (ref 70–99)
Glucose, Bld: 137 mg/dL — ABNORMAL HIGH (ref 70–99)
Potassium: 3.4 mEq/L — ABNORMAL LOW (ref 3.5–5.1)
Potassium: 3.6 mEq/L (ref 3.5–5.1)
Sodium: 128 mEq/L — ABNORMAL LOW (ref 135–145)
Sodium: 131 mEq/L — ABNORMAL LOW (ref 135–145)

## 2010-06-17 LAB — CBC
HCT: 27.3 % — ABNORMAL LOW (ref 39.0–52.0)
HCT: 28.8 % — ABNORMAL LOW (ref 39.0–52.0)
HCT: 35.3 % — ABNORMAL LOW (ref 39.0–52.0)
Hemoglobin: 12.6 g/dL — ABNORMAL LOW (ref 13.0–17.0)
Hemoglobin: 13.3 g/dL (ref 13.0–17.0)
Hemoglobin: 9.7 g/dL — ABNORMAL LOW (ref 13.0–17.0)
MCHC: 35 g/dL (ref 30.0–36.0)
MCHC: 35.3 g/dL (ref 30.0–36.0)
MCHC: 35.6 g/dL (ref 30.0–36.0)
MCHC: 35.9 g/dL (ref 30.0–36.0)
MCV: 101.4 fL — ABNORMAL HIGH (ref 78.0–100.0)
MCV: 99.9 fL (ref 78.0–100.0)
MCV: 99.9 fL (ref 78.0–100.0)
Platelets: 87 10*3/uL — ABNORMAL LOW (ref 150–400)
Platelets: 95 10*3/uL — ABNORMAL LOW (ref 150–400)
RBC: 2.84 MIL/uL — ABNORMAL LOW (ref 4.22–5.81)
RBC: 3.74 MIL/uL — ABNORMAL LOW (ref 4.22–5.81)
RDW: 12.5 % (ref 11.5–15.5)
RDW: 12.5 % (ref 11.5–15.5)
RDW: 12.8 % (ref 11.5–15.5)
WBC: 11.6 10*3/uL — ABNORMAL HIGH (ref 4.0–10.5)
WBC: 2.8 10*3/uL — ABNORMAL LOW (ref 4.0–10.5)
WBC: 3.3 10*3/uL — ABNORMAL LOW (ref 4.0–10.5)

## 2010-06-17 LAB — DIFFERENTIAL
Basophils Absolute: 0 10*3/uL (ref 0.0–0.1)
Basophils Absolute: 0 10*3/uL (ref 0.0–0.1)
Basophils Relative: 0 % (ref 0–1)
Eosinophils Absolute: 0.1 10*3/uL (ref 0.0–0.7)
Eosinophils Relative: 1 % (ref 0–5)
Eosinophils Relative: 1 % (ref 0–5)
Lymphocytes Relative: 22 % (ref 12–46)
Lymphs Abs: 0.6 10*3/uL — ABNORMAL LOW (ref 0.7–4.0)
Lymphs Abs: 1.5 10*3/uL (ref 0.7–4.0)
Monocytes Absolute: 0.3 10*3/uL (ref 0.1–1.0)
Neutro Abs: 1.8 10*3/uL (ref 1.7–7.7)
Neutrophils Relative %: 79 % — ABNORMAL HIGH (ref 43–77)

## 2010-06-17 LAB — COMPREHENSIVE METABOLIC PANEL
ALT: 41 U/L (ref 0–53)
CO2: 22 mEq/L (ref 19–32)
Calcium: 9.1 mg/dL (ref 8.4–10.5)
Chloride: 90 mEq/L — ABNORMAL LOW (ref 96–112)
GFR calc non Af Amer: >60 mL/min (ref >60)
Glucose, Bld: 77 mg/dL (ref 70–99)
Sodium: 127 mEq/L — ABNORMAL LOW (ref 135–145)
Total Bilirubin: 1.8 mg/dL — ABNORMAL HIGH (ref 0.3–1.2)

## 2010-06-17 LAB — POCT CARDIAC MARKERS: Myoglobin, poc: 60.6 ng/mL (ref 12–200)

## 2010-06-17 LAB — CARDIAC PANEL(CRET KIN+CKTOT+MB+TROPI)
CK, MB: 0.8 ng/mL (ref 0.3–4.0)
Total CK: 24 U/L (ref 7–232)
Troponin I: 0.01 ng/mL (ref 0.00–0.06)

## 2010-06-17 LAB — TROPONIN I: Troponin I: 0.04 ng/mL (ref 0.00–0.06)

## 2010-06-17 LAB — URINALYSIS, ROUTINE W REFLEX MICROSCOPIC
Bilirubin Urine: NEGATIVE
Ketones, ur: 15 mg/dL — AB
Leukocytes, UA: NEGATIVE
Nitrite: NEGATIVE
Protein, ur: NEGATIVE mg/dL
Urobilinogen, UA: 0.2 mg/dL (ref 0.0–1.0)

## 2010-06-17 LAB — URINE CULTURE
Colony Count: NO GROWTH
Culture: NO GROWTH

## 2010-06-17 LAB — CK TOTAL AND CKMB (NOT AT ARMC)
CK, MB: 1.5 ng/mL (ref 0.3–4.0)
Total CK: 38 U/L (ref 7–232)

## 2010-06-17 LAB — LIPID PANEL
Cholesterol: 119 mg/dL (ref 0–200)
HDL: 62 mg/dL (ref >39)
LDL Cholesterol: 42 mg/dL (ref 0–99)
Total CHOL/HDL Ratio: 1.9 RATIO
Triglycerides: 77 mg/dL (ref <150)

## 2010-06-17 LAB — LIPASE, BLOOD: Lipase: 23 U/L (ref 11–59)

## 2010-07-08 LAB — CBC
HCT: 40.9 % (ref 39.0–52.0)
MCHC: 33.2 g/dL (ref 30.0–36.0)
MCV: 98.3 fL (ref 78.0–100.0)
Platelets: 150 10*3/uL (ref 150–400)
RDW: 13.2 % (ref 11.5–15.5)

## 2010-07-08 LAB — COMPREHENSIVE METABOLIC PANEL
AST: 50 U/L — ABNORMAL HIGH (ref 0–37)
Albumin: 3.9 g/dL (ref 3.5–5.2)
BUN: 8 mg/dL (ref 6–23)
Calcium: 9.5 mg/dL (ref 8.4–10.5)
Chloride: 89 mEq/L — ABNORMAL LOW (ref 96–112)
Creatinine, Ser: 1.12 mg/dL (ref 0.4–1.5)
GFR calc Af Amer: >60 mL/min (ref >60)
Total Bilirubin: 2.4 mg/dL — ABNORMAL HIGH (ref 0.3–1.2)
Total Protein: 6.3 g/dL (ref 6.0–8.3)

## 2010-07-08 LAB — PROTIME-INR: INR: 0.9 (ref 0.00–1.49)

## 2010-07-08 LAB — APTT: aPTT: 29 seconds (ref 24–37)

## 2010-07-12 ENCOUNTER — Other Ambulatory Visit: Payer: Self-pay | Admitting: Family Medicine

## 2010-07-12 LAB — URINALYSIS, ROUTINE W REFLEX MICROSCOPIC
Glucose, UA: NEGATIVE mg/dL
Hgb urine dipstick: NEGATIVE
Ketones, ur: 15 mg/dL — AB
Protein, ur: NEGATIVE mg/dL

## 2010-07-12 LAB — COMPREHENSIVE METABOLIC PANEL
ALT: 55 U/L — ABNORMAL HIGH (ref 0–53)
Albumin: 3.2 g/dL — ABNORMAL LOW (ref 3.5–5.2)
Alkaline Phosphatase: 59 U/L (ref 39–117)
Chloride: 92 mEq/L — ABNORMAL LOW (ref 96–112)
Glucose, Bld: 146 mg/dL — ABNORMAL HIGH (ref 70–99)
Potassium: 3 mEq/L — ABNORMAL LOW (ref 3.5–5.1)
Sodium: 128 mEq/L — ABNORMAL LOW (ref 135–145)
Total Bilirubin: 1.1 mg/dL (ref 0.3–1.2)
Total Protein: 5.6 g/dL — ABNORMAL LOW (ref 6.0–8.3)

## 2010-07-12 LAB — DIFFERENTIAL
Basophils Relative: 1 % (ref 0–1)
Eosinophils Absolute: 0.1 10*3/uL (ref 0.0–0.7)
Monocytes Absolute: 0.3 10*3/uL (ref 0.1–1.0)
Monocytes Relative: 6 % (ref 3–12)
Neutrophils Relative %: 69 % (ref 43–77)

## 2010-07-12 LAB — CBC
Hemoglobin: 11.1 g/dL — ABNORMAL LOW (ref 13.0–17.0)
RDW: 12.5 % (ref 11.5–15.5)
WBC: 4.9 10*3/uL (ref 4.0–10.5)

## 2010-07-12 LAB — POCT I-STAT 4, (NA,K, GLUC, HGB,HCT): Hemoglobin: 12.9 g/dL — ABNORMAL LOW (ref 13.0–17.0)

## 2010-07-12 NOTE — Telephone Encounter (Signed)
Refill request

## 2010-07-17 LAB — COMPREHENSIVE METABOLIC PANEL
Alkaline Phosphatase: 58 U/L (ref 39–117)
BUN: 11 mg/dL (ref 6–23)
CO2: 25 mEq/L (ref 19–32)
Chloride: 86 mEq/L — ABNORMAL LOW (ref 96–112)
GFR calc non Af Amer: 59 mL/min — ABNORMAL LOW (ref >60)
Glucose, Bld: 72 mg/dL (ref 70–99)
Potassium: 3.3 mEq/L — ABNORMAL LOW (ref 3.5–5.1)
Total Bilirubin: 1.8 mg/dL — ABNORMAL HIGH (ref 0.3–1.2)

## 2010-07-17 LAB — DIFFERENTIAL
Basophils Absolute: 0 10*3/uL (ref 0.0–0.1)
Basophils Relative: 0 % (ref 0–1)
Monocytes Absolute: 0.5 10*3/uL (ref 0.1–1.0)
Neutro Abs: 3.6 10*3/uL (ref 1.7–7.7)
Neutrophils Relative %: 68 % (ref 43–77)

## 2010-07-17 LAB — CBC
HCT: 39.9 % (ref 39.0–52.0)
Hemoglobin: 13.7 g/dL (ref 13.0–17.0)
MCV: 95.8 fL (ref 78.0–100.0)
WBC: 5.2 10*3/uL (ref 4.0–10.5)

## 2010-07-19 ENCOUNTER — Telehealth: Payer: Self-pay | Admitting: Family Medicine

## 2010-07-19 NOTE — Telephone Encounter (Signed)
Message copied by Zachery Dauer on Thu Jul 19, 2010  2:00 PM ------      Message from: De Nurse      Created: Thu Jul 19, 2010 11:22 AM      Regarding: RE: schedule followup visit       I have tried several times to call patient and now phone has been disconnected.  Call Mort Sawyers, other contact, and left message to have her call us.      Thanks      Angelique Blonder            ----- Message -----         From: Tobin Chad         Sent: 07/12/2010   1:37 PM           To: [facility] Admin      Subject: schedule followup visit                                  Please call to schedule him for his 3 month follow up visit in my clinic or geri clinic

## 2010-08-14 NOTE — Consult Note (Signed)
[name], [name] NO.:  [id number]   MEDICAL RECORD NO.:  [id number]          PATIENT TYPE:  INP   LOCATION:  2902-08-27                         FACILITY:  [facility]   PHYSICIAN:  Zenaida Deed. Mayford Knife, M.D.DATE OF BIRTH:  01-24-1936   DATE OF CONSULTATION:  08/08/2007  DATE OF DISCHARGE:                                 CONSULTATION   REASON FOR CONSULTATION:  Alcoholism and hypoglycemia.   PRIMARY CARE PHYSICIAN:  [name], M.D., [facility] [place].   HISTORY OF PRESENT ILLNESS:  A 75 year old male with history of coronary  artery disease status post MI, CABG x2 went to the ER for a fall after  intoxication.  The patient also complained of chest pain and bradycardia  in the ER, and was found to have new left bundle branch block,  however, this block was noted in old H&P's, January 2009 and referred to  an EKG in Jun 27, 2003.  The patient was taken to a cath and found to have  3-vessel disease and had transverse pacer placed.  Currently, no chest  pain, shortness of breath, nausea, or vomiting.   The patient have a long history of alcohol abuse, more depression lately  due to his twin sisters' death in Jun 27, 2022.  He has been drinking 1 gallon  of vodka daily for at least the past 3 days.  He could write the book  on AA and has no interest in returning.  He calls himself depressed.  Decreased appetite, decreased concentration, decreased energy, and  sleeping all the time. Asked for any antidepressant.  No suicidal or  homicidal ideation.   PAST MEDICAL HISTORY:  1. Coronary artery disease status post CABG x2, status post MI.  2. Alcohol abuse.  3. Hypertriglyceridemia.  4. Chronic low back pain.  5. Prostate cancer and BPH.  6. Hypertension.  7. GERD.  8. History of pancreatitis.   FAMILY HISTORY:  Father died of Hodgkin's disease as did the twin  sister.  Mother died of COPD.  Brother has had GI bleed.  Father and  brother were alcoholics.   SOCIAL  HISTORY:  The patient lives alone and disabled secondary to MI.  Quit tobacco in 1988-08-26.  Wife died recently.  He says he has no friends.   ALLERGIES:  No known drug allergies.   MEDICATIONS:  1. Aspirin 81 mg changed to 325 mg here.  2. Fish oil 1 g daily.  3. Toprol-XL 50 mg, held secondary to bradycardia.  4. Allegra 180 mg.  5. Lisinopril 20 mg.  6. Hydrochlorothiazide 12.5 mg, held here secondary to low BP.  7. Prilosec 40 daily.  8. Zocor 20 mg.  9. Avodart 0.5 mg which is held.   PHYSICAL EXAMINATION:  VITAL SIGNS:  Pulse 74, respirations 13, 100% on  room air, and BP 114/49.  GENERAL:  On examination, alert and oriented x 3, and flat affect.  HEENT:  Moist mucous membranes.  CARDIOVASCULAR:  Regular rate and rhythm.  No murmur.  LUNGS:  Clear to auscultation bilaterally and anteriorly.  ABDOMEN:  Soft and nontender.  Positive bowel sounds.  EXTREMITIES:  No clubbing, cyanosis, or edema.  Dressings is clean, dry,  and intact on right femur.   LABORATORY DATA:  Cardiac enzymes negative x3.  Alcohol level 354.  Urine drug screen negative.  CT of the head shows nothing acute.  Sodium  133, potassium 3.4, chloride 101, bicarb 24, BUN 4, creatinine 0.7,  glucose 127, white blood cells 2.7, H&H 10.3/30.1, and platelets 152.  Normal diff on Aug 07, 2007.  CT of the head showed no acute.   ASSESSMENT AND PLAN:  A 75 year old alcoholic with bradycardia and three-  vessel disease.  1. Alcoholism/depression.  No current interest in alcohol anonymous      (AA), but we will continue to encourage.  On Ativan protocol status      post banana bag.  Will need to be discontinued on thiamine and      multivitamin..  In acute greif phase  and asking for      antidepressant.  No history of seizures.  We will start Wellbutrin      given depression and addiction.  I spent a while  talking to the      patient about alcoholism and its consequences.  He says he just      needs to get determined.   2. Cardiac.  The patient with three-vessel disease and pacer.  He is      on heparin drip and aspirin.  Beta blocker held secondary to      bradycardia and low BP.  3. Hypertriglyceridemia.  The patient takes fish oil.  4. Hypertension, meds held secondary to adequate control.  5. Benign prostatic hyperplasia.  Avodart held secondary to BP.  6. Gastroesophageal reflux disease.  Have proton pump inhibitor for      prophylaxis. Due to interaction, when we start Plavix, we will hold      it.  7. Hypokalemia.  Repeat and recheck.  8. Decreased white blood cells.  Okay yesterday, we will recheck in      morning.  9. Anemia.  MCV is borderline at 99.5.  We will check folic acid level      and B12.  Unknown colonoscopy status, we will need to have him      follow up in clinic, and make sure he is up-to-date.   DISPOSITION:  Home when stable and needs to be discontinued with Ativan  taper and back to alcohol.      [name], M.D.  Electronically Signed      Zenaida Deed. Mayford Knife, M.D.  Electronically Signed    HG/MEDQ  D:  08/08/2007  T:  08/08/2007  Job:  [id number]

## 2010-08-14 NOTE — Discharge Summary (Signed)
[name], BIELBY NO.:  [id number]   MEDICAL RECORD NO.:  [id number]          PATIENT TYPE:  INP   LOCATION:  2904                         FACILITY:  [facility]   PHYSICIAN:  Corky Crafts, MDDATE OF BIRTH:  05-14-1935   DATE OF ADMISSION:  08/08/2007  DATE OF DISCHARGE:  08/09/2007                               DISCHARGE SUMMARY   DISCHARGE DIAGNOSES:  1. Symptomatic bradycardia.  2. Alcohol intoxication.  3. Coronary artery disease.  4. Bypass graft disease.   HOSPITAL PROCEDURES:  Transvenous pacemaker placement, left heart  catheterization with coronary angiography, and bypass angiography.   HOSPITAL COURSE:  The patient was admitted because initially he was  inebriated and wanted detox.  He was found to have a left bundle branch  block pattern and had symptomatic bradycardia.  He was brought  emergently to the cath lab.  Transvenous pacemaker was placed.  A  diagnostic angiography showing no significant coronary artery disease.  He tolerated the procedure well.  His heart rate stabilized.  As he  became sober, the transvenous pacemaker was removed.  He did not have  any rhythm issues.  His beta-blocker was held and he did quite well.  Family practice was consulted.  They gave the patient an Ativan taper.  They also spoke to him about an outpatient alcohol rehabilitation.  He  is willing to try.   DISCHARGE MEDICATIONS:  1. Ativan 1-2 mg every 6 hours or as needed for tremors, for alcohol      withdrawal symptoms, do not take if drinking alcohol has been      restarted.  2. Lisinopril/HCTZ 20/12.5 mg a day.  3. Fish oil 2 g b.i.d.  4. Avapro 0.5 mg a day.  5. Prilosec 40 mg a day.  6. Aspirin 81 mg a day.  7. Tramadol 50 mg a day.  8. Simvastatin 20 mg a day.   INSTRUCTIONS:  The patient should stop taking metoprolol.  He will need  a colonoscopy and hemoglobin checked because of mild anemia that was  noted in the hospital.  We will also  consider an outpatient echo because  of a murmur.   FOLLOW-UP APPOINTMENT:  Follow-up with Dr. Sheffield Slider on Aug 13, 2007, at 10  a.m.  He will follow up with Dr. Eldridge Dace in 1-2 weeks.  He will call  for an appointment.      Corky Crafts, MD  Electronically Signed     JSV/MEDQ  D:  08/10/2007  T:  08/11/2007  Job:  [id number]   cc:   Deniece Portela A. Sheffield Slider, M.D.

## 2010-08-14 NOTE — Discharge Summary (Signed)
[name], [name] NO.:  [id number]   MEDICAL RECORD NO.:  [id number]          PATIENT TYPE:  INP   LOCATION:  3701                         FACILITY:  [facility]   PHYSICIAN:  Santiago Bumpers. Hensel, M.D.DATE OF BIRTH:  12/17/1935   DATE OF ADMISSION:  05/02/2007  DATE OF DISCHARGE:  05/02/2007                               DISCHARGE SUMMARY   PRIMARY CARE PHYSICIAN:  [name], M.D., [facility].   DISCHARGE DIAGNOSES:  1. Alcohol abuse/intoxication.  2. Chest pain.  3. Transient nausea.  4. Transient weakness.  5. History of coronary artery disease, status post coronary artery      bypass graft x2.  6. History of prostate cancer.  7. History of hypertriglyceridemia.  8. History of hypertension.  9. History of gastroesophageal reflux disease.  10.History of benign prostatic hypertrophy.  11.History of chronic low back pain.  12.History of pancreatitis.   DISCHARGE MEDICATIONS:  1. Aspirin 81 mg daily.  2. Fish oil 1,000 mg daily.  3. Allegra 180 mg daily.  4. Lisinopril 20 mg daily.  5. HCTZ 12.5 mg daily.  6. Prilosec 40 mg daily.  7. Zocor 20 mg daily.  8. Toprol XL 50 mg daily.  9. Ultracet 1-2 tabs every 6 hours as needed for pain.  10.Avodart 0.5 mg daily.   HOSPITAL COURSE:  A 75 year old white male admitted with weakness and  chest pain that had been going on for 2 days and had been associated  with an alcohol binge over the last 4 days.  The patient stopped  drinking for a couple of years preceding this binge.  He was admitted  because of weakness and chest pain.  He ruled out for the chest pain and  his weakness resolved as his alcohol was metabolized.  By the time of  discharge, the patient was able to walk down the hall without an issue.  He had no nausea.  The patient also had resolved chest pain and normal  cardiac enzymes.   LABORATORY RESULTS:  Cardiac panel negative x2, ten hours apart.  Ammonia 32.  Liver function  tests showed a slightly increased AST,  otherwise normal.  Albumin was 3.1.  Amylase and lipase were normal.  Lipid profile with total cholesterol 156, triglycerides 89, HDL 90,  LDL  58.  Urine drug screen was completely negative.  Urinalysis was clean.  Hemoglobin 13.4.  Creatinine 0.68.   DIAGNOSIS:  1. Chest pain.  The patient ruled out for acute myocardial infarction.      He does have a history of coronary artery bypass graft.  I would      advise primary care physician to consider referral back to his      cardiologist for further risk stratification given his      significantly positive history of coronary artery disease.  2. Nausea.  This was transient and resolved.  I assume this was      associated with his alcohol as the patient's alcohol level      decreased this seemed to go away.  3. Weakness.  As above, I think this was related to the alcohol      ingestion as this was metabolized, the patient did much better and      he was able to walk and was basically back to his baseline at the      time of discharge.   PROCEDURES:  Chest x-ray showed no evidence of acute cardiac or  pulmonary process.  EKG was essentially unchanged with a left bundle  branch block.   FOLLOWUP:  The patient will call to follow up with Dr. Sheffield Slider in the next  1-2 weeks at [phone] at the family practice center.   DISCHARGE ACTIVITY:  No restrictions.   DISCHARGE DIET:  Low sodium, heart healthy.      Angeline Slim, M.D.  Electronically Signed      [name], M.D.  Electronically Signed    AL/MEDQ  D:  05/02/2007  T:  05/03/2007  Job:  [id number]

## 2010-08-14 NOTE — H&P (Signed)
NAMEKEISON, GLENDINNING NO.:  [id number]   MEDICAL RECORD NO.:  [id number]          PATIENT TYPE:  EMS   LOCATION:  ED                           FACILITY:  [facility]   PHYSICIAN:  Wendi Snipes, MD DATE OF BIRTH:  1935/08/05   DATE OF ADMISSION:  08/07/2007  DATE OF DISCHARGE:                              HISTORY & PHYSICAL   PRIORITY ADMISSION HISTORY AND PHYSICAL   PRIMARY CARE PHYSICIAN:  [name], MD.   Admission to Cardiac Intensive Care Unit.   CHIEF COMPLAINT:  Chest pain.   HISTORY OF PRESENT ILLNESS:  Mr. Kirby is a 75 year old white male with  a past medical history significant for alcohol abuse, coronary artery  disease status post bypass grafts x2, hypertension and hyperlipidemia,  who presents to [facility] Emergency Department with a chief complaint  of status post fall and alcohol intoxication.  The patient was recently  admitted in January for a recurrence of substance abuse problems and  recently experienced the death of his twin sister in 06/26/2022 and had been  very depressed over the last few months.  His niece notes that the  patient has been doing worse over the past few days, and her brother had  seen him today.  During that time, her brother noted that the patient  was complaining of some chest pain and prompted him to go to the ER  regardless of his intoxication.  According to reports, he fell over as  he was trying to take care of his dog and subsequently went to the ER.  The patient was intoxicated and unable to communicate very well and was  found to have a bradycardic event in that ER.  His EKG showed a left  bundle branch pattern, which was thought to be new at that time.  A Code  STEMI was subsequently initiated, and the patient was emergently  transferred to [facility] for primary PCI.   PAST MEDICAL HISTORY:  1. History of coronary artery disease status post coronary artery      bypass grafts in the 90s.  His current  anatomy is unknown.  2. Status post myocardial infarction.  3. Hypertriglyceridemia.  4. Prostate cancer.  5. Hypertension.  6. Gastroesophageal reflux disease.  7. BPH.  8. Chronic low back pain.  9. History of pancreatitis.   MEDICATIONS ON ADMISSION:  1. Aspirin 81 mg daily.  2. Fish oil 1 g daily.  3. Allegra 180 mg daily.  4. Lisinopril 20 mg daily.  5. Hydrochlorothiazide 12.5 mg daily.  6. Prilosec 40 mg daily.  7. Zocor 20 mg daily.  8. Toprol-XL 50 mg daily.  9. Ultracet one to two tabs every 6 hours as needed for pain.  10.Avodart 0.5 mg daily.   ALLERGIES:  No known drug allergies.   FAMILY HISTORY:  Father died from Hodgkin's disease.  Mother died from  COPD.  No early coronary disease.   SOCIAL HISTORY:  The patient lives alone.  He is disabled from coronary  artery disease.  He quit smoking in 1990.  He continues to  drink  heavily.   REVIEW OF SYSTEMS:  The patient's review of systems is unable to be  obtained secondary to mental status.  The patient denies chest pain at  this point, however, has received several sedating medicines and is  unable to converse appropriately at this time.   PHYSICAL EXAMINATION:  VITAL SIGNS:  His blood pressure was 130/72, his  heart rate was paced at 60, respirations 16.  His saturation is 96% on 2  liters nasal cannula.  GENERAL:  He is a 75 year old white male appearing stated age in mild  acute distress.  HEENT:  Moist mucous membranes, anicteric sclerae, pupils equal, round,  and reactive to light and accommodation.  NECK:  No jugulovenous distention, no thyromegaly.  CHEST:  Regular rate and rhythm.  No murmurs, rubs, or gallops.  LUNGS:  Clear to auscultation bilaterally.  ABDOMEN:  Nontender and nondistended, positive bowel sounds.  EXTREMITIES:  No clubbing, cyanosis, or edema.  SKIN:  Warm, dry, and intact.  No rashes.   LABORATORY DATA:  From the [facility] ED, his sodium was 125.  His  alcohol levels  reported to be about 350.  His cardiac enzymes are  pending at this time.  EKG reviewed shows a chronic left bundle,  however, other EKGs showed that he has a junctional escape with a more  narrow complex at approximately 34 beats per minute with nonspecific ST  and T-wave changes.   ASSESSMENT AND PLAN:  1. Bradycardia.  The patient will be taken emergently to the      catheterization laboratory for transcutaneous pacing.  At this      time, his coronary anatomy will be evaluated by cardiac      catheterization; however, this patient currently does not meet the      criteria for Code STEMI.  We will check cardiac enzymes and      continue to evaluate for signs of ischemia.  The patient is      currently chest pain free.  Will continue heparin, aspirin, his ACE      inhibitor, statin, and we will hold the beta blocker in context of      his current bradycardia.  We will continue to evaluate further      causes for bradycardia including metabolic or drug induced.  2. Intoxication.  He received a rally pack at [facility], and we will      continue to administer CIWA protocol as      the patient has a questionable history of delirium tremens.  3. Hypertension.  We will continue his ACE inhibitor, however, monitor      for hemodynamic instability in light of his acute situation.      Wendi Snipes, MD  Electronically Signed     BHH/MEDQ  D:  08/07/2007  T:  08/08/2007  Job:  [id number]

## 2010-08-14 NOTE — Cardiovascular Report (Signed)
[name], DAVISSON NO.:  [id number]   MEDICAL RECORD NO.:  [id number]          PATIENT TYPE:  EMS   LOCATION:  MAJO                         FACILITY:  [facility]   PHYSICIAN:  Corky Crafts, MDDATE OF BIRTH:  [birth year]   DATE OF PROCEDURE:  DATE OF DISCHARGE:                            CARDIAC CATHETERIZATION   REFERRING PHYSICIAN:  Gray Bernhardt M.D.   PROCEDURES PERFORMED:  Left heart catheterization, left ventriculogram,  coronary angiogram, vein graft bypass angiogram, and arterial bypass  graft angiogram.   OPERATOR:  Corky Crafts, MD   INDICATIONS:  Questionable new left bundle branch block, symptomatic  bradycardia, and chest pain.   PROCEDURE NARRATIVE:  The patient was in the ER and found to have  symptomatic bradycardia in the setting of new left bundle branch block.  He was also inebriated, so history was very difficult to obtain.  He was  brought emergently to the cath lab.  He was prepped and draped in the  usual sterile fashion.  His right groin was infiltrated with 1%  lidocaine.  A 6-French sheath was placed into the right femoral vein,  using modified Seldinger technique.  Subsequently, a 6-French sheath was  placed into the right femoral artery, transvenous pacer was placed into  the right ventricle using fluoroscopic guidance, subsequently left  coronary artery angiography was performed using a JL-4 pigtail catheter.  The catheter was advanced to the vessel ostium under fluoroscopic  guidance.  Digital angiography was performed in multiple projections  using hand injection of contrast.  The right coronary artery angiography  was then performed using a JR-4 pigtail catheter.  The catheter was  advanced to the vessel ostium under fluoroscopic guidance.  Digital  angiography was performed using hand injection of contrast.  The right  coronary artery catheter was used to engage the SVG to RCA, SVG to OM  and SVG to diagonal.  The  RCA catheter was then used to engage the  ostium of the subclavian.  IMA catheter was placed into the LIMA and  digital angiography was performed in 2 projections using hand injection  of contrast.  The pigtail catheter was advanced to the ascending aorta  and across the aortic valve.  A power injection of contrast was  performed in the RAO projection.  The image of the left ventricle  catheter was pulled back under continuous hemodynamic pressure  monitoring.  The catheter was then withdrawn and [employer] was deployed  for hemostasis   FINDINGS:  The left main was widely patent with some minor  irregularities.  The left circumflex is a medium-sized vessel.  There is  a small OM off the distal part of the circumflex that was patent.  The  circumflex gave some left-to-right collaterals.  The left anterior  descending was occluded after the first septal perforator.  The right  coronary artery was chronically occluded proximally.  There was some  right-to-right collaterals.  The SVG to RCA was occluded.  The SVG to  diagonal was patent.  The SVG to OM was widely patent.  The LIMA to LAD  was widely patent.  There is moderate diffuse atherosclerosis in the LAD  up to 50% just after the anastomosis.  Left ventriculogram showed normal  ventricular function.  LV pressure of 101/90 with an LVEDP of 17 mmHg.  Aortic pressure of 106/45 with a mean aortic pressure of 67 mmHg.   IMPRESSION:  1. Successful transvenous pacemaker placement.  2. Severe 3-vessel coronary artery disease including an occluded left      anterior descending, right coronary artery, and obtuse marginal.  3. Patent left internal mammary artery to left anterior descending,      saphenous vein graft to diagonal, saphenous vein graft to obtuse      marginal.  4. Occluded saphenous vein graft to right coronary artery.  5. Normal left ventricular function.  6. Mildly increased left ventricular end-diastolic pressure.    RECOMMENDATIONS:  The patient will be monitored in the CCU.  He will  continue with the transvenous pacemaker set at 60 beats per minute.  We  have to wait for the alcohol to ware off and obtain a better history, if  his bradycardia persists, he may need a permanent pacemaker placed.  Otherwise, we will continue aggressive medical therapy.      Corky Crafts, MD  Electronically Signed     JSV/MEDQ  D:  08/08/2007  T:  08/08/2007  Job:  [id number]

## 2010-08-14 NOTE — H&P (Signed)
[name], [name] NO.:  [id number]   MEDICAL RECORD NO.:  [id number]          PATIENT TYPE:  INP   LOCATION:  3701                         FACILITY:  [facility]   PHYSICIAN:  [name], M.D.DATE OF BIRTH:  1935/12/09   DATE OF ADMISSION:  05/02/2007  DATE OF DISCHARGE:                              HISTORY & PHYSICAL   CHIEF COMPLAINT:  Weakness.   PRIMARY CARE PHYSICIAN:  Marcus A. Sheffield Slider, MD, at the [facility].   HISTORY OF PRESENT ILLNESS:  This is a 75 year old male with past  medical history significant for coronary artery disease and alcohol  abuse, presenting with a two-day history of weakness also associated  with a pressure-like pain in his sternum that is now relieved.  The  patient admits to binge-drinking x4 days and very little other p.o.  intake during this time period.  Denies nausea, vomiting, hematemesis,  blood per rectum, abdominal pain, loss of consciousness, and falls.  He  says the chest pain is similar in quality to angina.  Cardiac enzymes  are negative x1 in the emergency department, and the EKG shows only a  left bundle branch block, which is present on an EKG from March of 2005  as well.  Chest x-ray is negative for acute disease.  The patient is  intoxicated so history is somewhat limited.  He received aspirin on  route to the emergency department and has an alcohol level of 342.  He  says he had not been drinking for some time prior to this week but just  started again a few days ago.  This is consistent with notes from the  primary care physician's office.   PAST MEDICAL HISTORY:  1. History of pancreatitis.  2. Alcohol abuse.  3. BPH.  4. Prostate cancer.  5. Hypertriglyceridemia.  6. Hypertension.  7. Low back pain.  8. Reflux esophagitis.  9. Coronary artery disease with two CABG's.   FAMILY HISTORY:  Father deceased from Hodgkin's disease, mother deceased  from COPD, brother deceased from a  GI bleed, sister is alive with  Hodgkin's disease.   SOCIAL HISTORY:  The patient lives alone and is disabled after his first  MI.  He quit smoking in 1990 and does abuse alcohol as noted in the HPI  above.  He is unable to say how much he has been drinking.   MEDICATIONS:  1. Aspirin 81 mg p.o. daily.  2. Fish oil 1000 mg p.o. daily.  3. Allegra 180 mg p.o. daily.  4. Lisinopril 20 mg p.o. daily.  5. Hydrochlorothiazide 12.5 mg p.o. daily.  6. Prilosec 40 mg p.o. daily.  7. Simvastatin 20 mg p.o. daily.  8. Toprol-XL 50 mg p.o. daily.  9. Tramadol/acetaminophen 37.5/325 one to two tabs p.o. q.6h p.r.n.      pain.  10.Avodart 0.5 mg p.o. daily.   ALLERGIES:  No known drug allergies.   PHYSICAL EXAMINATION:  VITAL SIGNS:  Temperature 97.6, heart rate 78,  respiration rate 20, blood pressure 106/63, oxygen saturation 97 to 90%  on room air.  GENERAL:  Awake, oriented to person and place but not time.  CARDIO:  Regular rate and rhythm with no murmurs, rubs, or gallops, 2+  radial pulses.  PULMONARY:  Crackles on the right with normal work of breathing.  ABDOMEN:  Distended, fluid-filled, nontender with positive bowel sounds.  EXTREMITIES:  No edema, nontender.  NEURO:  Cranial nerves II-XII are intact, and the patient moves all four  extremities spontaneously.   LABORATORY DATA:  Significant for a sodium of 127, a creatinine of 1.0,  negative point-of-care cardiac enzymes actually x2 at this point,  alcohol level of 342, white blood count of 4.6 with a hemoglobin of 13.4  and a hematocrit of 38.1, platelets were clumped and cannot be measured.  EKG shows a chronic left bundle branch block.  Chest x-ray shows  cardiomegaly, no edema, and no active cardiopulmonary disease.   ASSESSMENT AND PLAN:  This is a 75 year old male with weakness after an  alcohol binge.  1. Weakness.  Nonfocal neurologic examination.  Symptoms are      consistent with the timeframe of the alcohol binge.   Will admit for      observation, with a full neurologic examination in the morning when      the patient is sober.  Given the alcohol history and the abdominal      examination, will obtain an abdominal ultrasound, liver function      tests, and an ammonia level.  2. Alcohol abuse.  Banana bag.  Will not start Ativan unless he shows      withdrawal signs; however, he has been sober until the past few      days so consider him to be a low risk for withdrawals.  Substance      abuse counseling to be provided in the morning.  3. Chest pain.  Resolved, and point-of-care enzymes are negative x2      with a nonischemic electrocardiogram.  However, given the history      of coronary artery disease, will admit to a telemetry bed and cycle      his enzymes.  We will start a proton pump inhibitor and aspirin 81      mg.  Will continue his statin and fish oil but hold the beta      blocker for now pending a urine drug screen.  Given the patient's      history of pancreatitis, we will also check triglycerides, lipase,      and amylase.  4. Hypertension.  Continue the patient's home dose of Lisinopril and      hydrochlorothiazide.  5. Benign prostatic hypertrophy.  Continue the patient's home dose of      Avodart.  6. Disposition.  The patient lives alone and cannot go home until he      is sober and stable.      Marcus Belling, MD  Electronically Signed      [name], M.D.  Electronically Signed   MO/MEDQ  D:  05/02/2007  T:  05/02/2007  Job:  [id number]

## 2010-08-14 NOTE — Op Note (Signed)
[name], [name] NO.:  [id number]   MEDICAL RECORD NO.:  [id number]          PATIENT TYPE:  OIB   LOCATION:  1334                         FACILITY:  [facility]   PHYSICIAN:  Anselm Pancoast. Weatherly, M.D.DATE OF BIRTH:  1935-10-19   DATE OF PROCEDURE:  05/31/2008  DATE OF DISCHARGE:                               OPERATIVE REPORT   PREOPERATIVE DIAGNOSES:  Right inguinal hernia, probably direct.   POSTOPERATIVE DIAGNOSIS:  Right inguinal hernia, pantalon.   OPERATION:  Right inguinal herniorrhaphy with mesh reinforcement,  Lichtenstein-type repair.   ANESTHESIA:  Spinal anesthesia with local supplementation.   SURGEON:  Dr. Consuello Bossier.   HISTORY:  [name] is a 75 year old Caucasian male patient with  cancer of the prostate followed by Dr. Annabell Howells and I do not think he is  actually under active treatment right at this time.  He states that he  really does not have problems voiding.  He gets up with nocturia x2, but  he has got an obvious bulge in the right groin that is painful.  I  recommended that we repair it with MAC and local.  The anesthesiologist  thought a spinal would be better, so Dr. Rica Mast did a spinal  anesthesia.   DESCRIPTION OF PROCEDURE:  We then positioned him on the OR table,  prepped and clipped the right groin which been previously marked, and  then prepped him with Betadine solution and draped him in a sterile  manner.  The inguinal incision area was then opened with sharp  dissection down through the skin, subcutaneous tissue.  Two veins were  clamped, divided, ligated with fine Vicryl and then the external oblique  aponeurosis was opened exposing the cord structures.  These were  elevated with a Penrose and you could see a direct inguinal hernia  coming through the floor that was about the size of golf ball.  The cord  structures were elevated.  The ilioinguinal nerve was protected.  I  noted that he also had another hernia  about the size also of a golf ball  of an indirect component and I separated this from the cord structures,  opened the sac and did a high sac ligation under direct vision with 2-0  Surgilon, the second suture just distal.  I then anesthetized this and  then removed the hernia sac.  The defect where he had the direct hernia  was reduced and I closed this with a 2-0 Vicryl initially and then used  the 2-0 Prolene to kind of do a Lichtenstein type repair, suturing the  shelving edge and then the inguinal ligament to the conjoined tendon  area, pulling then reinforcing the new internal ring.  I ran the sutures  back a second layer with the same stitch, and tied the 2 ends together.  Next, a piece of Prolene mesh shaped like a sail slit laterally was used  to reinforce the floor.  It was sutured to the inguinal ligament with a  running 2-0 Prolene.  The 2 tails went around the internal ring, the  ilioinguinal nerve comes out through  the slit, and then the 2 ends  laterally were incorporated in the running 2-0 Prolene suture and the  mesh was lying flat.  Then the superior flap was sutured down with  interrupted sutures of 2-0 Prolene and I used a  couple of 2-0 Surgilon  since I did not need to open another 2-0 Prolene.  The external oblique  was sutured over the cord structures with a running 2-0 Vicryl.  It is  lying nicely.  I did put a little Marcaine at the ilioinguinal nerve  area and also in the floor under direct vision, about 20 mL total used.  The Scarpa's fascia was closed with 3-0 Vicryl, 4-0 Monocryl  subcuticular, Benzoin and Steri-Strips on the skin.  Testicle was in its normal position and the patient was sent to the  recovery room in stable postop condition.  He lives alone so we are  going to keep him here tonight to make sure he is not having problems  with voiding, etc., and he should be able to be discharged in the a.m..  We will continue him on his chronic  medications.           ______________________________  Anselm Pancoast. Zachery Dakins, M.D.     WJW/MEDQ  D:  05/31/2008  T:  06/01/2008  Job:  [id number]   cc:   Excell Seltzer. Annabell Howells, M.D.  Fax: [phone]   [facility]

## 2010-08-14 NOTE — Op Note (Signed)
[name], [name]                ACCOUNT NO.:  [id number]   MEDICAL RECORD NO.:  [id number]          PATIENT TYPE:  AMB   LOCATION:  NESC                         FACILITY:  [facility]   PHYSICIAN:  [name], M.D.    DATE OF BIRTH:  1935/05/15   DATE OF PROCEDURE:  10/27/2008  DATE OF DISCHARGE:                               OPERATIVE REPORT   PROCEDURE:  Prostate seed implant and cystoscopy.   PREOPERATIVE DIAGNOSIS:  T1C Gleason 6 prostate cancer.   POSTOPERATIVE DIAGNOSIS:  T1C Gleason 6 prostate cancer.   SURGEON:  Dr. Bjorn Pippin.   RADIATION ONCOLOGIST:  Dr. Billie Lade.   ANESTHESIA:  General.   DRAINS:  A 16-French Foley catheter.   COMPLICATIONS:  None.   INDICATIONS:  [name] is a 75 year old white male with a T1C Gleason 6  prostate cancer, who is to undergo a seed implantation for definitive  therapy.   FINDINGS/DESCRIPTION OF PROCEDURE:  He was given Cipro and was taken to  the operating room where general anesthetic was induced.  He was placed  in the lithotomy position.  His genitalia was prepped with Betadine  solution and a 16-French Foley catheter was inserted.  The balloon was  filled with dilute contrast and the scrotum and penis were secured using  an OpSite dressing.  The perineum was clipped.  The red rubber catheter  was placed and an ultrasound probe was positioned and secured to the  fixed base. Once the position was optimized, the perineum was prepped  with Betadine solution.  The biopsy grid was placed on the ultrasound  carriage, and Dr. Roselind Messier, radiation physicist, directed the  intraoperative plan using the Nucletron device.   Please note that once the biopsy grid was in position, stabilization  needles were placed and then the intraoperative plan was then created  using the Nucletron device.   After completion of the plan, I entered the field and placed the needles  according to the intraoperative plan.  A total of 27 needles were  used  to apply 68 seeds within the prostate.  Once all of the seeds had been  placed, the stabilization needles were removed and the ultrasound was  backed out.  Fluoroscopy was then performed, which demonstrated no stray  seeds and an appropriate-appearing seed distribution.   The drapes were removed.  The Foley catheter was then removed.  The  penis was reprepped and cystoscopy was performed using the 16-French  flexible scope.  Examination revealed a normal urethra.  The external  sphincter was intact.  The prostate had bilobar hyperplasia with some  coaptation.  There was some mild bleeding in the prostatic urethra.  Examination of the bladder revealed mild trabeculation.  No tumors or  stones were noted.  No retained seeds or clots were noted.  Ureteral  orifices were unremarkable.  The cystoscope was then removed and a fresh  16-French Foley catheter was inserted.  The balloon was filled with 10  mL of sterile fluid.  The catheter was placed to straight drainage.  The  perineum was cleansed, after  removal of the red rubber catheter and a  dressing was applied.   The patient was taken down from lithotomy position and removed to the  recovery room in stable condition.  He will be discharged home with  prescriptions for Cipro and Vicodin and followup will be arranged by Dr.  Trina Ao office.      [name], M.D.  Electronically Signed     JJW/MEDQ  D:  10/27/2008  T:  10/27/2008  Job:  [id number]   cc:   Billie Lade, M.D.  Fax: [phone]   [name], M.D.

## 2010-08-17 NOTE — Discharge Summary (Signed)
[name], [name]                          ACCOUNT NO.:  [id number]   MEDICAL RECORD NO.:  [id number]                   PATIENT TYPE:  INP   LOCATION:  2002                                 FACILITY:  [facility]   PHYSICIAN:  Dani Gobble, MD                    DATE OF BIRTH:  1935/12/19   DATE OF ADMISSION:  06/24/2003  DATE OF DISCHARGE:  06/27/2003                                 DISCHARGE SUMMARY   DISCHARGE DIAGNOSES:  1. Chest pain, negative myocardial infarction, negative Cardiolite study.  2. Coronary artery disease with history of coronary artery bypass grafting     and redo grafting.  3. Hyperlipidemia.  4. Positive family history of coronary disease.   CONDITION ON DISCHARGE:  Stable.   DISCHARGE MEDICATIONS:  The patient will continue medications as he was  taking prior to admission which include:  1. Fish oil.  2. Xanax 0.25.  3. Altace 5.  4. Toprol 25.  5. Lipitor 10.  6. Prilosec 20.  7. Nitroglycerin p.r.n.   He did not want to wait for any further instructions or discussions.  He  just left the building.   FOLLOW UP:  Dr. Alanda Amass told the patient he would no longer be able to  take care of the patient as he will not follow our instructions and the  patient agreed he does need to be seen by a physician in the future.   HISTORY OF PRESENT ILLNESS:  A 75 year old white male with history of  alcohol abuse, was admitted to [facility] on June 25, 2003, by Dr. Ilene Qua secondary to chest pain.  The patient had been  seeing Dr. Algie Coffer but wished to change physicians.  The patient began to  experience nausea Monday prior to admission on Friday.  Believed he had some  chest pain afterward.  Intermittent waxing and waning pattern.  The patient  is a poor historian and inconsistent history.  The patient initially stated  the chest pain was exertional but later reported it occurred at rest.  Uncertain duration.  Positive nausea.  No  vomiting, questionable  diaphoresis.  No radiation.  Some shortness of breath that had been  occurring for approximately one month, inconsistently relieved with  nitroglycerin.   The patient was admitted to rule out MI.   PAST MEDICAL HISTORY:  1. Hyperlipidemia.  2. Hypertension.  3. In addition, the patient had bypass grafting initially in 1985 and repeat     CABG in 1997.  On his Cardiolite last year, EF was 44% and negative     Cardiolite.   FAMILY HISTORY:  Coronary disease as stated.   SOCIAL HISTORY:  Lives alone.  Three children.  Remote tobacco use.  Heavy  alcohol use.   OUTPATIENT MEDICATIONS:  His outpatient medications are as discharge  medications.   REVIEW OF SYMPTOMS:  See H&P.  PHYSICAL EXAMINATION AT DISCHARGE:  GENERAL APPEARANCE:  The patient was  somewhat trembling by the afternoon of June 27, 2003, and wanted to be  discharged.  Otherwise, alert and oriented white male.  SKIN:  Warm and dry.  LUNGS:  Clear.  CARDIOVASCULAR:  Regular rate and rhythm.   LABORATORY DATA:  Admitting labs with hemoglobin 16.7, hematocrit 47.4, wbc  4.4, platelets 177, wbc drop at 2.9, platelets dropped to 85,000 on heparin.  Neutrophils 32, lymphs 57, monos 10, eos 1, baso 1.  Protime 11.9, INR 0.8  on admission.  He was therapeutic on heparin.  Sodium on admission 141,  potassium 3.7, chloride 100, CO2 24, glucose 102, BUN 6, creatinine 0.9,  calcium 9.1, total protein 6.9, albumin 3.8, AST 140, ALT 90, ALP 90, total  bilirubin 1.3, lipase 38.  This did come down with AST down to 90, ALT down  to 69, ALP 75, total bilirubin 1.6.  CK-MB all negative, ranged 26 to 27, MB  0.9 to 0.8.  Troponin I 0.02 to 0.01.  Lipids with cholesterol 222,  triglycerides 177, HDL 66, and LDL 121.  TSH 0.936.  Alcohol level 247.  Cardiac markers in the ER were negative.   EKG initially sinus rhythm, right bundle branch block, cannot rule out  anterior septal MI and the patient developed a  left bundle branch block and  was in and out of that periodically during the hospital stay.  Also had  other EKGs with significant ST depressions but again would clear.   HOSPITAL COURSE:  [name] was admitted June 25, 2003, by Dr. Domingo Sep who  was on call for cardiology for [place]. [facility] ER.  The  patient was admitted with chest pain, significant history of coronary  disease.  Also at the time his alcohol level was 247.   On admission, his LFTs were elevated.  His Lipitor was held.  He was placed  on heparin and nitroglycerin and over the weekend, he ruled out for  myocardial infarction and was scheduled for Persantine Cardiolite June 27, 2003.  The morning of June 27, 2003, his nitroglycerin was discontinued and  with his platelets at 85,000, his heparin was discontinued.  Plans were for  hematology consult and HIT panel to rule out thrombocytopenia secondary to  heparin.  His white count also dropped down to 2.9.  Before hematology  consult could be arranged or as the results of his Cardiolite were received,  the patient decided it was time for him to go home.  He did not want to wait  and talk to Dr. Alanda Amass.  He called and had a ride and he signed out AMA.   The patient's Cardiolite does reveal negative ischemia, EF is 43%.  He will  be notified of that at home and offered the chance to see physician in  [place], [place], at least for a follow-up prior to patient  receiving his own cardiologist.   Dr. Alanda Amass was aware of the patient signing out AMA also.      [name], N.P.                     Dani Gobble, MD    LRI/MEDQ  D:  06/27/2003  T:  06/28/2003  Job:  [id number]

## 2010-08-17 NOTE — Discharge Summary (Signed)
NAMEENMANUEL, ZUFALL                          ACCOUNT NO.:  [id number]   MEDICAL RECORD NO.:  [id number]                   PATIENT TYPE:  INP   LOCATION:  6522                                 FACILITY:  [facility]   PHYSICIAN:  Ricki Rodriguez, M.D.               DATE OF BIRTH:  October 16, 1935   DATE OF ADMISSION:  08/23/2002  DATE OF DISCHARGE:  08/25/2002                                 DISCHARGE SUMMARY   DISCHARGE DIAGNOSES:  1. Noncardiac chest pain.  2. History of coronary artery disease.  3. History of coronary artery bypass graft surgery.  4. Hyperlipidemia.  5. Old myocardial infarction.  6. Tobacco use disorder.  7. Chronic alcohol use.   DISCHARGE MEDICATIONS:  1. Aspirin 81 mg 1 p.o. daily.  2. Pravachol 30 mg 1 p.o. daily.  3. Toprol XL 25 mg 1 daily.  4. Lipitor 10 mg daily.  5. Altace 2.5 mg daily.  6. Xanax 0.25 mg twice daily.  7. Nitroglycerin 0.4 mg sublingual q.5 minutes x3 p.r.n. for chest pain, as     directed.  8. Tylenol 325 mg q.i.d. as needed.   DISCHARGE ACTIVITIES:  As tolerated.   DIET:  Low-fat, low-salt diet.  The patient is to decrease alcohol  consumption by 50% and then after two weeks decrease another 50%  consumption, and then discontinue the alcohol use.   FOLLOW UP:  Dr. Orpah Cobb in two weeks.  The patient will call [phone]  for an appointment.   HISTORY:  This 75 year old white male with a known coronary artery disease  and bypass graft surgery had chest pain radiating to the left side, and also  was admitted with increased stress with sick family members.  Cardiac risk  factors include smoking, which he quit 12-13 years ago, but has second-hand  smoking now, and continues to drink moderately.  He also has a history of  elevated cholesterol level and old myocardial infarction, and a family  history of premature coronary artery disease.   PHYSICAL EXAMINATION:  VITAL SIGNS:  Temperature 98.0, pulse 68,  respirations 12, blood  pressure 123/76, height 5 feet 11 inches, weight 186  pounds.  The patient was alert and oriented x3.  HEENT:  Head normocephalic, atraumatic.  He has frontal baldness.  Eyes,  blue, equal and reactive to light, extraocular movements intact.  Ears,  nose, and throat reveal the patient to be edentulous.  Mucous membranes pink  and moist.  NECK:  No JVD, no carotid bruit.  LUNGS:  Clear bilaterally.  HEART:  Normal S1, S2.  ABDOMEN:  Soft, nontender, nondistended.  EXTREMITIES:  No edema, cyanosis, or clubbing.  CNS:  The patient moves all four extremities.   LABORATORY DATA:  Revealed a normal hemoglobin and hematocrit, WBC count,  and platelet count.  Normal electrolytes, BUN low at 5, creatinine 0.8.  Liver enzymes normal, except for borderline total bilirubin  of 1.3.  Magnesium was 2.2.  Cardiac enzymes were normal x2.  Liver profile showed  cholesterol elevated at 251, triglyceride at 573.  EKG revealed sinus  bradycardia with right bundle branch block and possible septal infarct.  Nuclear stress test, EKG negative, chest pain positive, and no inducible  ischemia with ejection fraction of 44% and apical scarring, septal akinesis,  and apical hypokinesis.   HOSPITAL COURSE:  The patient was admitted to telemetry and was started on  Lovenox, oxygen, and antianginal medications.  He underwent nuclear stress  test which did not show any inducible ischemia, hence, he was discharged  home in satisfactory condition after the addition of Xanax for his anxiety.  The patient was strongly advised to decrease alcohol intake and see me in  two weeks.                                               Ricki Rodriguez, M.D.    ASK/MEDQ  D:  09/28/2002  T:  09/28/2002  Job:  [id number]

## 2010-08-17 NOTE — Discharge Summary (Signed)
NAMEKISEAN, ROLLO                          ACCOUNT NO.:  [id number]   MEDICAL RECORD NO.:  [id number]                   PATIENT TYPE:  INP   LOCATION:  1610                                 FACILITY:  [facility]   PHYSICIAN:  Sherin Quarry, MD                   DATE OF BIRTH:  24-Nov-1935   DATE OF ADMISSION:  02/20/2002  DATE OF DISCHARGE:  02/22/2002                                 DISCHARGE SUMMARY   HISTORY OF PRESENT ILLNESS:  The patient is a 75 year old man with a very  longstanding history of alcohol abuse.  He indicates that he drinks one jug  of whiskey a day.  He presented to the emergency room in an intoxicated  state and was noted to exhibit some distal tremor.  He was evaluated for  medical clearance for ADS.  Because of the tremor, concern was raised in the  emergency room that he might be suffering from withdrawal symptoms and that  he might require detoxification.  For this reason, Dr. Sharl Ma of the  hospitalist service was contacted and the patient was evaluated.   PAST MEDICAL HISTORY:  1. Degenerative disk disease.  2. COPD with emphysema.  3. Past history of coronary artery disease  status post CABG.   MEDICATIONS:  He currently takes no medications.   PHYSICAL EXAMINATION:  VITAL SIGNS:  Temperature 98.2, blood pressure  150/90, pulse 86, respirations 16, O2 saturation 96% on room air.  GENERAL:  The patient was an intoxicated gentleman who was in no acute  distress.  HEENT:  Within normal limits.  CHEST:  Revealed scattered rhonchi with mild expiratory wheezes.  CARDIOVASCULAR:  Revealed normal S1 and S2 without rubs, murmurs, or  gallops.  ABDOMEN:  Benign with normal bowel sounds.  There were no masses or  tenderness.  Liver edge was palpable about 3 cm below the right costal  margin.  There was evidence of an old right inguinal hernia repair.  NEUROLOGIC:  The patient's motor exam was normal.  He was noted to have a  fine distal tremor which appeared to  be an intention tremor.  He exhibited  difficulty with performing finger-nose-finger and his station and gait was  described as ataxic.  EXTREMITIES:  Revealed no evidence of cyanosis or edema.   LABORATORY DATA:  Relevant laboratory studies included a CBC which revealed  a hemoglobin of 16.  Sodium 142, potassium 3.5, chloride 108, bicarbonate  25, creatinine 1.1, BUN 9.  Liver profile was within normal limits.  Drug  screen was done; this showed negative salicylate levels, acetaminophen  levels, and negative screens for drugs of abuse; the alcohol level was 308.  Chest x-ray was within normal limits.  Electrocardiogram revealed left  bundle-branch block.   HOSPITAL COURSE:  Essentially, the patient was admitted to the hospital.  Intravenous fluids were initiated.  He was given IV thiamine.  Ativan  withdrawal protocol was instituted.  The ADS was contacted and we were  advised that the patient did not meet their criteria for admission.  For  this reason, on November 24 the patient was discharged.  At the time of  discharge he was alert.  He had a chronic intention tremor but no signs of  alcohol withdrawal.   DISCHARGE DIAGNOSES:  1. Acute alcohol intoxication.  2. Chronic obstructive pulmonary disease with 80 pack-year smoking history.  3. Coronary artery disease status post myocardial infarction.  4. Tinea pedis.   Prior to the patient's discharge he was given a flu shot and Pneumovax shot.   CONDITION ON DISCHARGE:  Fair.                                               Sherin Quarry, MD    SY/MEDQ  D:  02/22/2002  T:  02/22/2002  Job:  [id number]

## 2010-08-17 NOTE — Discharge Summary (Signed)
[place]. [facility]  Patient:    [name], [name] Visit Number: [id number] MRN: [id number]          Service Type: Attending:  Ricki Rodriguez, M.D. Dictated by:   Ricki Rodriguez, M.D. Adm. Date:  06/27/01 Disc. Date: 07/02/01                             Discharge Summary  DISCHARGE DIAGNOSES: 1. Angina. 2. Chronic obstructive pulmonary disease. 3. Low back pain. 4. Lumbar spine degeneration. 5. Chronic alcohol use. 6. Old myocardial infarction.  DISCHARGE MEDICATIONS: 1. Toprol XL 25 mg one daily. 2. Multivitamin one daily. 3. Xanax 0.25 mg twice daily. 4. Thiamine 100 mg one daily. 5. Artificial Tears as needed. 6. Nitroglycerin 0.4 mg sublingual tablet every five minutes x3 as needed    for chest pain.  ACTIVITY:  As tolerated.  The patient is to use walker until low back evaluation is done on outpatient basis.  DIET:  Low fat, low salt diet.  DISCHARGE INSTRUCTIONS:  The patient is to have home health care to assist with physical therapy for ambulation.  FOLLOW-UP:  By Ricki Rodriguez, M.D. in two weeks.  HISTORY OF PRESENT ILLNESS:  This 75 year old white male was admitted with complaint of substernal chest pain radiating to left arm and jaw off and on for the last three days.  The patient had history of coronary artery disease and bypass graft surgery x2.  The patient felt better after oxygen use and has remote history of smoking which he quit 12 years ago, however, does admit to second-hand smoke and has history of alcohol use for a long time.  Also has history of myocardial infarction in 1985 and 1997 with coronary artery bypass graft surgery in 1997.  PHYSICAL EXAMINATION:  VITAL SIGNS: Temperature 98, pulse 70, respirations 11, blood pressure 124/74, height 5 feet 11 inches, weight 185 pounds.  GENERAL: The patient was alert and oriented x 3 and had oxygen saturation 97% on 2 liters.  HEENT: Head normocephalic and atraumatic with  frontal baldness. Had smell of alcohol on his breath.  Eyes; blue with extraocular movements intact. Ears, nose, and throat; mucous membranes pink and moist.  The patient was edentulous.  NECK: No JVD and no carotid bruit.  LUNGS: Clear bilaterally. HEART: Normal S1 and S2.  ABDOMEN: Soft and distended.  EXTREMITIES: No clubbing, cyanosis, or edema.  NEUROLOGICAL: Cranial nerves II-XII intact.  LABORATORY DATA:  Normal hemoglobin and hematocrit.  Normal electrolytes except potassium of 3.4, subsequent potassium was 4.0, however, this was slightly hemolysed specimen.  CK-MB and troponin I were unremarkable. Elevated SGOT of 350, SGPT 325.  PT 12.5, PTT 27.  EKG was sinus rhythm with left bundle branch block.  Chest x-ray showed cardiomegaly.  X-ray of the lumbar spine showed severe degenerative disease of the lower lumbar spine.  Nuclear stress test did not show any reversible ischemia and had ejection fraction of 50%.  There was anterior apical scarring.  HOSPITAL COURSE:  The patient was admitted to telemetry unit.  Myocardial infarction was ruled out.  He underwent nuclear stress test as well as cardiac catheterization per patient request.  Because of his low platelet count, Lovenox was discontinued on second day of hospitalization and because of his alcohol use, DT prophylaxis was started with use of Librium.  Initially, the patient was somewhat sedated from Librium, however, in 24 to 48 hours, his condition  stabilized.  His abnormal liver function tests were considered secondary to alcohol use.  He required walker for ambulation.  His lumbar spine x-ray showed degenerative disease of the spine.  He also underwent CT of the head that was negative for any acute change, however, showed some atrophic change.  He had a falsely elevated total cholesterol due to elevated HDL, however, his LDL was normal.  Hence, no antilypemic therapy was instituted. Overall, the patients condition was  stable and was discharged home in satisfactory condition on July 02, 2001, with follow-up by me in two weeks. Dictated by:   Ricki Rodriguez, M.D. Attending:  Ricki Rodriguez, M.D. DD:  09/03/01 TD:  09/05/01 Job: [id number]

## 2010-08-23 ENCOUNTER — Encounter: Payer: Self-pay | Admitting: Family Medicine

## 2010-08-28 ENCOUNTER — Ambulatory Visit: Payer: MEDICARE | Admitting: Family Medicine

## 2010-09-14 ENCOUNTER — Emergency Department (HOSPITAL_COMMUNITY): Payer: Medicare Other

## 2010-09-14 ENCOUNTER — Encounter: Payer: Self-pay | Admitting: Family Medicine

## 2010-09-14 ENCOUNTER — Inpatient Hospital Stay (HOSPITAL_COMMUNITY)
Admission: EM | Admit: 2010-09-14 | Discharge: 2010-09-21 | DRG: 309 | Disposition: A | Discharge status: Home / Self Care | Payer: Medicare Other | Attending: Family Medicine | Admitting: Family Medicine

## 2010-09-14 DIAGNOSIS — E876 Hypokalemia: Secondary | ICD-10-CM | POA: Diagnosis present

## 2010-09-14 DIAGNOSIS — I447 Left bundle-branch block, unspecified: Secondary | ICD-10-CM | POA: Diagnosis present

## 2010-09-14 DIAGNOSIS — I4891 Unspecified atrial fibrillation: Secondary | ICD-10-CM | POA: Diagnosis present

## 2010-09-14 DIAGNOSIS — G252 Other specified forms of tremor: Secondary | ICD-10-CM | POA: Diagnosis present

## 2010-09-14 DIAGNOSIS — Z7982 Long term (current) use of aspirin: Secondary | ICD-10-CM

## 2010-09-14 DIAGNOSIS — I10 Essential (primary) hypertension: Secondary | ICD-10-CM | POA: Diagnosis present

## 2010-09-14 DIAGNOSIS — Z8546 Personal history of malignant neoplasm of prostate: Secondary | ICD-10-CM

## 2010-09-14 DIAGNOSIS — I959 Hypotension, unspecified: Secondary | ICD-10-CM | POA: Diagnosis present

## 2010-09-14 DIAGNOSIS — G250 Essential tremor: Secondary | ICD-10-CM | POA: Diagnosis present

## 2010-09-14 DIAGNOSIS — Z87891 Personal history of nicotine dependence: Secondary | ICD-10-CM

## 2010-09-14 DIAGNOSIS — Z79899 Other long term (current) drug therapy: Secondary | ICD-10-CM

## 2010-09-14 DIAGNOSIS — I2510 Atherosclerotic heart disease of native coronary artery without angina pectoris: Secondary | ICD-10-CM | POA: Diagnosis present

## 2010-09-14 DIAGNOSIS — S0100XA Unspecified open wound of scalp, initial encounter: Secondary | ICD-10-CM | POA: Diagnosis present

## 2010-09-14 DIAGNOSIS — Y92009 Unspecified place in unspecified non-institutional (private) residence as the place of occurrence of the external cause: Secondary | ICD-10-CM

## 2010-09-14 DIAGNOSIS — E781 Pure hyperglyceridemia: Secondary | ICD-10-CM | POA: Diagnosis present

## 2010-09-14 DIAGNOSIS — K219 Gastro-esophageal reflux disease without esophagitis: Secondary | ICD-10-CM | POA: Diagnosis present

## 2010-09-14 DIAGNOSIS — D649 Anemia, unspecified: Secondary | ICD-10-CM | POA: Diagnosis present

## 2010-09-14 DIAGNOSIS — K4090 Unilateral inguinal hernia, without obstruction or gangrene, not specified as recurrent: Secondary | ICD-10-CM | POA: Diagnosis present

## 2010-09-14 DIAGNOSIS — E785 Hyperlipidemia, unspecified: Secondary | ICD-10-CM | POA: Diagnosis present

## 2010-09-14 DIAGNOSIS — I252 Old myocardial infarction: Secondary | ICD-10-CM

## 2010-09-14 DIAGNOSIS — F10229 Alcohol dependence with intoxication, unspecified: Secondary | ICD-10-CM | POA: Diagnosis present

## 2010-09-14 DIAGNOSIS — W010XXA Fall on same level from slipping, tripping and stumbling without subsequent striking against object, initial encounter: Secondary | ICD-10-CM | POA: Diagnosis present

## 2010-09-14 DIAGNOSIS — Z951 Presence of aortocoronary bypass graft: Secondary | ICD-10-CM

## 2010-09-14 DIAGNOSIS — I5032 Chronic diastolic (congestive) heart failure: Secondary | ICD-10-CM | POA: Diagnosis present

## 2010-09-14 DIAGNOSIS — I495 Sick sinus syndrome: Principal | ICD-10-CM | POA: Diagnosis present

## 2010-09-14 DIAGNOSIS — E871 Hypo-osmolality and hyponatremia: Secondary | ICD-10-CM | POA: Diagnosis present

## 2010-09-14 DIAGNOSIS — N400 Enlarged prostate without lower urinary tract symptoms: Secondary | ICD-10-CM | POA: Diagnosis present

## 2010-09-14 LAB — DIFFERENTIAL
Basophils Relative: 0 % (ref 0–1)
Eosinophils Absolute: 0.1 10*3/uL (ref 0.0–0.7)
Neutrophils Relative %: 56 % (ref 43–77)

## 2010-09-14 LAB — CBC
Hemoglobin: 3.9 g/dL — CL (ref 13.0–17.0)
MCH: 33.6 pg (ref 26.0–34.0)
Platelets: 100 10*3/uL — ABNORMAL LOW (ref 150–400)
RBC: 1.16 MIL/uL — ABNORMAL LOW (ref 4.22–5.81)
RBC: 3.02 MIL/uL — ABNORMAL LOW (ref 4.22–5.81)
WBC: 5.7 10*3/uL (ref 4.0–10.5)

## 2010-09-14 LAB — BASIC METABOLIC PANEL
CO2: 8 mEq/L — CL (ref 19–32)
CO2: 22 mEq/L (ref 19–32)
Calcium: <4 mg/dL — CL (ref 8.4–10.5)
Calcium: 8.3 mg/dL — ABNORMAL LOW (ref 8.4–10.5)
Chloride: 93 mEq/L — ABNORMAL LOW (ref 96–112)
GFR calc Af Amer: >60 mL/min (ref >60)
Sodium: 144 mEq/L (ref 135–145)
Sodium: 130 mEq/L — ABNORMAL LOW (ref 135–145)

## 2010-09-14 LAB — PROTIME-INR
INR: 2.38 — ABNORMAL HIGH (ref 0.00–1.49)
Prothrombin Time: 26.4 seconds — ABNORMAL HIGH (ref 11.6–15.2)

## 2010-09-14 LAB — ETHANOL: Alcohol, Ethyl (B): 120 mg/dL — ABNORMAL HIGH (ref 0–11)

## 2010-09-14 LAB — DIGOXIN LEVEL: Digoxin Level: <0.3 ng/mL — ABNORMAL LOW (ref 0.8–2.0)
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
## 2010-09-15 ENCOUNTER — Emergency Department (HOSPITAL_COMMUNITY): Payer: Medicare Other

## 2010-09-15 DIAGNOSIS — R269 Unspecified abnormalities of gait and mobility: Secondary | ICD-10-CM

## 2010-09-15 DIAGNOSIS — I495 Sick sinus syndrome: Secondary | ICD-10-CM

## 2010-09-15 DIAGNOSIS — F10229 Alcohol dependence with intoxication, unspecified: Secondary | ICD-10-CM

## 2010-09-15 DIAGNOSIS — S0100XA Unspecified open wound of scalp, initial encounter: Secondary | ICD-10-CM

## 2010-09-15 LAB — COMPREHENSIVE METABOLIC PANEL
ALT: 30 U/L (ref 0–53)
AST: 43 U/L — ABNORMAL HIGH (ref 0–37)
Alkaline Phosphatase: 52 U/L (ref 39–117)
GFR calc Af Amer: >60 mL/min (ref >60)
Glucose, Bld: 79 mg/dL (ref 70–99)
Potassium: 3.5 mEq/L (ref 3.5–5.1)
Sodium: 134 mEq/L — ABNORMAL LOW (ref 135–145)
Total Protein: 5.7 g/dL — ABNORMAL LOW (ref 6.0–8.3)

## 2010-09-15 LAB — PROTIME-INR: INR: 1.03 (ref 0.00–1.49)

## 2010-09-15 LAB — CBC
HCT: 27.1 % — ABNORMAL LOW (ref 39.0–52.0)
MCV: 97.5 fL (ref 78.0–100.0)
Platelets: 91 10*3/uL — ABNORMAL LOW (ref 150–400)
RBC: 2.78 MIL/uL — ABNORMAL LOW (ref 4.22–5.81)
WBC: 4 10*3/uL (ref 4.0–10.5)

## 2010-09-16 LAB — BASIC METABOLIC PANEL
BUN: 11 mg/dL (ref 6–23)
BUN: 11 mg/dL (ref 6–23)
CO2: 27 mEq/L (ref 19–32)
CO2: 26 mEq/L (ref 19–32)
Chloride: 97 mEq/L (ref 96–112)
Chloride: 98 mEq/L (ref 96–112)
Creatinine, Ser: 0.54 mg/dL (ref 0.50–1.35)
Creatinine, Ser: 0.73 mg/dL (ref 0.50–1.35)
GFR calc Af Amer: >60 mL/min (ref >60)
Glucose, Bld: 125 mg/dL — ABNORMAL HIGH (ref 70–99)
Glucose, Bld: 173 mg/dL — ABNORMAL HIGH (ref 70–99)
Potassium: 3.5 mEq/L (ref 3.5–5.1)
Potassium: 3.8 mEq/L (ref 3.5–5.1)

## 2010-09-17 LAB — MAGNESIUM: Magnesium: 1.7 mg/dL (ref 1.5–2.5)

## 2010-09-17 LAB — BASIC METABOLIC PANEL
CO2: 24 mEq/L (ref 19–32)
Chloride: 101 mEq/L (ref 96–112)
GFR calc Af Amer: >60 mL/min (ref >60)
Potassium: 3.9 mEq/L (ref 3.5–5.1)
Sodium: 134 mEq/L — ABNORMAL LOW (ref 135–145)

## 2010-09-17 NOTE — H&P (Signed)
NAMEWELFORD, CHRISTMAS NO.:  [id number]  MEDICAL RECORD NO.:  [id number]  LOCATION:  4709                         FACILITY:  [facility]  PHYSICIAN:  Leighton Roach Crystallynn Noorani, M.D.DATE OF BIRTH:  03/13/1936  DATE OF ADMISSION:  09/14/2010 DATE OF DISCHARGE:                             HISTORY & PHYSICAL   PRIMARY CARE PROVIDER:  [name], MD at [facility].  CHIEF COMPLAINT:  Fall.  HISTORY OF PRESENT ILLNESS:  Mr. Rhodes is a 75 year old male who tripped on his rug this evening and hit the left side of his head on an objective on the wall on his way to the floor.  This caused a laceration which required a trip to the emergency room.  He states that he tripped on a rug in his house and denies any syncope, palpitations, chest pain, or dyspnea preceding this fall.  He is a chronic alcoholic and had been drinking as per usual this evening prior to his fall.  Upon arrival to the emergency room, he was noted to be bradycardic to the 40s and hypotensive to the systolic blood pressure up to 60A.  He was treated with IV fluid resuscitation which he responded well to and increased his blood pressure to the 110s/70s, his heart rate up into the 70s.  He denies any headache or weakness or numbness.  He takes his blood pressure medications independently from a pill box, he thinks he took them correctly prior to admission.  No other complaints.  PAST MEDICAL HISTORY: 1. Hypertriglyceridemia. 2. Mild hyponatremia. 3. Recurrent alcohol abuse. 4. Chronic depression. 5. Familial tremor. 6. Hypertension. 7. Coronary artery disease. 8. Left bundle-branch block. 9. Atrial fibrillation. 10.Chronic diastolic heart failure. 11.GERD. 12.Right inguinal hernia. 13.Benign prostatic hypertrophy. 14.History of prostate cancer. 15.History of pancreatitis. 16.Anemia. 17.Gait abnormalities. 18.History of transaminitis.  PAST SURGICAL HISTORY: 1. ORIF of  metatarsal fracture. 2. Right cataract excision. 3. Prostate biopsy. 4. Recurrent CABG, most recently in 2009. 5. Insertion of prostate radiation seed in 2011.  SOCIAL HISTORY:  He is a widowed, former smoker, current alcohol drinker.  No other drugs.  FAMILY HISTORY:  Father and sister both died of cancer.  ALLERGIES:  No known drug allergies.  MEDICATIONS: 1. Aspirin 81 daily. 2. Hydrochlorothiazide 12.5 daily. 3. Lisinopril 20 mg daily. 4. Metoprolol XL 100 mg daily. 5. Multivitamin daily. 6. Nitroglycerin as needed. 7. Omega-3, 1200 mg daily. 8. MiraLax 1 capsule in 4 ounces of water. 9. Zocor 20 mg daily. 10.Ultram as needed. 11.Levitra as needed.  REVIEW OF SYSTEMS:  Per HPI, otherwise normal.  PHYSICAL EXAMINATION:  VITAL SIGNS:  Heart rate is 75, blood pressure 110/70, respiratory rate 70, sating 100% on room air. GENERAL:  Alert, cooperative, appears stated age. HEENT:  Equal, reactive pupils.  Extraocular motion intact.  A 6-cm left frontal scalp laceration that is stapled and currently hemostatic. NECK:  Flat neck veins. HEART:  Irregularly irregular rhythm, brady-to-slow normal rate. Distant heart sounds, but an S3 is present. LUNGS:  Unlabored breathing, bibasilar rales.  No wheezing. ABDOMEN:  Soft, nontender, nondistended.  Normoactive bowel sounds. EXTREMITIES:  Normal, atraumatic.  No cyanosis. SKIN:  No  rashes, but mild ecchymosis on his arms. NEUROLOGY:  No focal finding.  He is alert and oriented, appropriate. Sensation and strength are intact.  LABORATORY FINDINGS:  Sodium of 130, potassium 3.0, chloride 92, bicarb 22, BUN 9, creatinine 0.74.  AST is 125, ALT is 64, total bilirubin is 1.7.  CBC shows white count of 5.7, hemoglobin 10.3, platelets 100, MCV is 97.4.  Troponin is negative.  Alcohol level is 120 on admission.  CT of the head shows no acute abnormalities.  EKG shows initial bradycardia with AFib and bundle-branch block.  His rate is  unchanged from his old study, but appearance of QRST segments are not changed.  ASSESSMENT AND PLAN:  A 75 year old male with fall. 1. Fall, uncertain of etiology.  The patient states that he tripped.     He had been drinking per usual for him and does have a balance     impairment at baseline.  However, his hypotension and bradycardia     may indicate a hypoperfusion or syncope cause for this fall.  We     are going to keep him on monitor tonight and cycle enzymes.  We     will discuss in further detail below. 2. Bradycardia/hypotension.  Again, not sure of etiology.  I favor     over medication as his cause.  He insists that he has taken his     medications correctly, however, and he has told that he does this     quite for sometime.  Additionally, he could have worsening of the     electrophysiological properties of his heart, resulting in this     bradycardia.  Plan:  We will follow on telemetry and hold     metoprolol and other negative inotropes.  We will likely discuss     with the [facility] in the morning as he is their patient.     He may want followup and consideration for pacer if his bradycardia     becomes persistent or recurrent.  Tonight if bradycardia returns     and does not correct with some fluids, we will call Cardiology and     if hypoperfusing, we will consider transcutaneous pacing. 3. Hypokalemia, likely chronic in conjunction with hypomagnesemia in a     chronic alcoholic patient.  We will replete potassium orally with K-     Dur and check a mag level, likely give IV mag tomorrow prior to     discharge. 4. Alcoholism.  Denies any history of alcohol withdrawal or seizure,     however, he is a heavy committed drinker.  We will have him on the     CIWA protocol here. 5. Transaminitis.  This is essentially a chronic issue for this     patient, likely due to alcohol.  We will make sure we check an INR     to assess for synthetic liver function. 6. Fluids,  electrolytes, nutrition/gastrointestinal.  We will have him     on a heart-healthy diet. 7. Prophylaxis.  Sequential compression devices as his platelets are     100 and he has a fresh laceration and a diet for gastrointestinal     prophylaxis. 8. Code status.  Full code. 9. Disposition.  When workup is completed.     Clementeen Graham, MD   ______________________________ Etta Grandchild, M.D.    EC/MEDQ  D:  09/15/2010  T:  09/15/2010  Job:  [id number]  Electronically Signed by Clementeen Graham  on 09/17/2010 09:26:44 AM Electronically Signed by Acquanetta Belling M.D. on 09/17/2010 12:40:18 PM

## 2010-09-18 DIAGNOSIS — I495 Sick sinus syndrome: Secondary | ICD-10-CM

## 2010-09-18 LAB — BASIC METABOLIC PANEL
CO2: 24 mEq/L (ref 19–32)
Chloride: 97 mEq/L (ref 96–112)
Creatinine, Ser: 0.65 mg/dL (ref 0.50–1.35)
GFR calc Af Amer: >60 mL/min (ref >60)
Sodium: 130 mEq/L — ABNORMAL LOW (ref 135–145)

## 2010-09-18 LAB — MAGNESIUM: Magnesium: 1.6 mg/dL (ref 1.5–2.5)

## 2010-09-19 LAB — TSH: TSH: 1.467 u[IU]/mL (ref 0.350–4.500)

## 2010-09-19 LAB — BASIC METABOLIC PANEL
CO2: 24 mEq/L (ref 19–32)
Calcium: 9.6 mg/dL (ref 8.4–10.5)
Chloride: 93 mEq/L — ABNORMAL LOW (ref 96–112)
Creatinine, Ser: 0.75 mg/dL (ref 0.50–1.35)
GFR calc Af Amer: >60 mL/min (ref >60)
Sodium: 129 mEq/L — ABNORMAL LOW (ref 135–145)

## 2010-09-19 LAB — MAGNESIUM: Magnesium: 1.9 mg/dL (ref 1.5–2.5)

## 2010-09-19 NOTE — Consult Note (Signed)
[name], PERSONS NO.:  [id number]  MEDICAL RECORD NO.:  [id number]  LOCATION:  4709                         FACILITY:  [facility]  PHYSICIAN:  Jake Bathe, MD      DATE OF BIRTH:  [birth year]  DATE OF CONSULTATION: DATE OF DISCHARGE:                                CONSULTATION   CARDIOLOGIST:  Corky Crafts, MD  PRIMARY CARE PHYSICIAN:  [name], MD  REASON FOR CONSULTATION:  Evaluation of irregular heartbeat.  HISTORY OF PRESENT ILLNESS:  A 75 year old male with long history of coronary artery disease, bypass surgery x2 who previously in January 2011, had an episode of atrial flutter and was evaluated by EP at that time, was asymptomatic and converted to sinus rhythm.  He was having 2.5- second pauses which were asymptomatic as well.  No catheter ablation was performed at that time and he was instructed to abstain from alcohol. He was thought not to be a good Coumadin candidate at that time due to longstanding history of alcohol abuse.  He states that he does drink a few drinks occasionally.  I was called to evaluate him because of an irregular heartbeat.  On telemetry, when he first arrived in the hospital, he was noted to be bradycardic, in atrial fibrillation with a left bundle-branch block morphology with heart rate at 46.  He takes Toprol-XL 100 mg once a day and he states that he has been compliant with his medication.  Because of this relative bradycardia as well as some hypotension when he arrived, this was discontinued.  He was admitted on September 15, 2010.  Today is September 17, 2010.  On the monitor, he did demonstrate without any AV nodal blocking agents, bradycardia at 5:49 a.m. with a 3.12-second pause at 5:49 a.m. which was asymptomatic. He did a 2.6-second pause at 4:41 a.m. previous to that.  This was all while sleeping.  As of no previously he had pauses as well as were noted in previous consultation note on November 11, 2009.  Once  again this was without AV nodal blocking agents.  Most recently over the last few hours, he has developed quite significant atrial fibrillation with rapid ventricular response.  His heart rate had increased to the 160s and currently is back down into the 70s-80s.  Once again, he has not been on his Toprol 100 mg XL.  When asked about alcohol, he states that he still drinks a few drinks. He has quite a large laceration on the left scalp which he states was from a mechanical fall when he slipped on his a plastic floor runner. He denies syncope.  He is quite adamant about this.  He also denies any significant palpitations.  PAST MEDICAL HISTORY: 1. CAD status post bypass surgery x2. 2. Prior atrial fibrillation flutter, deemed a non-Coumadin candidate. 3. Tachybrady syndrome. 4. Left bundle-branch block. 5. History of prostate cancer. 6. Dyslipidemia.  PAST SURGICAL HISTORY:  Seed implantation of his prostate, bypass, cataract surgery.  FAMILY HISTORY:  His brother died from a GI bleed, father had Hodgkin disease, mother had COPD.  SOCIAL HISTORY:  Alcohol use current.  Former smoker.  No  illicit drug use.  He is widowed.  ALLERGIES:  No known drug allergies.  MEDICATIONS AT HOME: 1. He is taking aspirin 81 mg a day. 2. Hydrochlorothiazide 12.5 mg a day. 3. Lisinopril 20 mg a day. 4. Metoprolol XL 100 mg a day. 5. Multivitamin daily. 6. Nitroglycerin as needed. 7. Omega-3 a day. 8. Zocor 20 mg a day. 9. Ultram and Levitra as needed.  REVIEW OF SYSTEMS:  Head laceration noted.  Mechanical fall noted. Denies any chest pain, palpitations, shortness of breath, fevers, chills, nausea, vomiting.  Unless specified above, all other 12 review of systems negative.  PHYSICAL EXAMINATION:  VITAL SIGNS:  Currently, heart rate in the 70s- 80s, atrial fibrillation with left bundle-branch block morphology. Blood pressure 123/69 up to 149/84, satting 97% on room air, temperature is  98.2, respiration rate 18. GENERAL:  He is alert and oriented x3, in bed, appears comfortable, in no acute distress. HEENT:  Eyes, well-perfused conjunctivae.  EOMI.  No scleral icterus. NECK:  Supple.  No lymphadenopathy.  No thyromegaly.  No carotid bruits. No JVD. CARDIOVASCULAR:  Irregularly irregular rhythm.  Normal rate with no appreciable murmurs, rubs, or gallops. LUNGS:  Clear to auscultation bilaterally.  Normal respiratory effort. ABDOMEN:  Soft, nontender.  No active bowel sounds.  No rebound or guarding.  No bruits. EXTREMITIES:  No clubbing, cyanosis, or edema. SKIN:  Warm, dry, and intact.  No rashes.  Palpable distal pulses. NEUROLOGIC:  Currently nonfocal.  No tremors noted.  DATA:  Magnesium level 1.7.  Sodium 134, potassium 3.9, BUN 9, creatinine 0.5.  AST mildly elevated at 43, albumin 2.9.  INR is 1.03. Hemoglobin 9.6, hematocrit 27.1, platelet count 91.  EKG is personally reviewed from September 17, 2010, at 11 a.m. today, left bundle-branch block morphology, atrial fibrillation, no ST-segment changes.  On telemetry, he has atrial fibrillation with rapid ventricular response, heart rate in the 150s-160s.  On September 16, 2010, at 6 a.m., he had atrial fibrillation with slow ventricular response, heart rate is 50 with a more narrow complex QRS duration.  His QRS duration has been intermittent.  Has an intermittent left bundle-branch block, likely related to rate.  Echocardiogram from January 2011, showed normal ejection fraction and paradoxical septal wall motion.  ASSESSMENT AND PLAN:  A 75 year old male with atrial fibrillation, tachybrady syndrome, recent fall with scalp laceration, deemed non- syncopal by the patient, but mechanical with 3.2-second pause and recent heart rates in the 160s.  1. Atrial fibrillation with tachycardia-bradycardia syndrome - he was     taking Toprol-XL 100 mg at home.  He did have quite a slow     ventricular response upon arrival in the  hospital, however, he has     once again demonstrated quite significant atrial fibrillation and     heart rates.  I will go ahead and place him on half the dosing,     split in the metoprolol tartrate 25 mg twice a day and continue to     closely monitor him on telemetry.  If he continues to have quite     significant pauses or has symptomatic pauses during the daytime     hours, we will likely need to consult Electrophysiology for     pacemaker placement.  EP has seen him in the past according to Dr.     Hoyle Barr prior consultation note.  This is clearly classic tachy-     brady syndrome.  This is not ventricular tachycardia but atrial  fibrillation with wide complex tachycardia and is clearly     irregularly irregular on telemetry.  He has an underlying left     bundle-branch block which seems to be rate related. 2. Alcohol use - per Primary Team.  He is getting counseling on this     and may need rehabilitation.  Certainly, I would be a very hesitant     about Coumadin in this gentleman given his recent fall and severe     alcohol use. 3. Coronary artery disease - currently stable post bypass.  No anginal     symptoms. 4. Tachybrady syndrome or sinoatrial node dysfunction - watch very     closely while on metoprolol.  May need pacemaker placement.  I will     relay findings to Dr. Everette Rank.     Jake Bathe, MD     MCS/MEDQ  D:  09/17/2010  T:  09/18/2010  Job:  [id number]  cc:   Deniece Portela A. Sheffield Slider, M.D.  Electronically Signed by Donato Schultz MD on 09/19/2010 06:50:50 AM

## 2010-09-20 LAB — BASIC METABOLIC PANEL
BUN: 21 mg/dL (ref 6–23)
Calcium: 9.5 mg/dL (ref 8.4–10.5)
GFR calc Af Amer: >60 mL/min (ref >60)
GFR calc non Af Amer: >60 mL/min (ref >60)
Glucose, Bld: 92 mg/dL (ref 70–99)
Potassium: 3.3 mEq/L — ABNORMAL LOW (ref 3.5–5.1)
Sodium: 132 mEq/L — ABNORMAL LOW (ref 135–145)

## 2010-09-21 ENCOUNTER — Other Ambulatory Visit: Payer: Self-pay | Admitting: Family Medicine

## 2010-09-21 DIAGNOSIS — E871 Hypo-osmolality and hyponatremia: Secondary | ICD-10-CM

## 2010-09-21 DIAGNOSIS — E876 Hypokalemia: Secondary | ICD-10-CM

## 2010-09-21 LAB — BASIC METABOLIC PANEL
BUN: 24 mg/dL — ABNORMAL HIGH (ref 6–23)
Calcium: 9.3 mg/dL (ref 8.4–10.5)
GFR calc Af Amer: >60 mL/min (ref >60)
GFR calc non Af Amer: >60 mL/min (ref >60)
Glucose, Bld: 93 mg/dL (ref 70–99)

## 2010-09-21 NOTE — Progress Notes (Signed)
BP check, BMET to follow up K and Na from hospitalization, and staple removal

## 2010-09-21 NOTE — Telephone Encounter (Signed)
refill request

## 2010-09-23 NOTE — Consult Note (Signed)
NAMEGARREN, [name] NO.:  [id number]  MEDICAL RECORD NO.:  [id number]  LOCATION:  4704                         FACILITY:  [facility]  PHYSICIAN:  Hillis Range, MD       DATE OF BIRTH:  08-06-1935  DATE OF CONSULTATION:  09/18/2010 DATE OF DISCHARGE:                                CONSULTATION   REQUESTING PHYSICIAN:  Corky Crafts, MD  REASON FOR CONSULTATION:  Tachycardia bradycardia.  HISTORY OF PRESENT ILLNESS:  [name] is a 75 year old gentleman with a history of longstanding heavy alcohol consumption and longstanding persistent atrial fibrillation who was admitted after having a mechanical fall.  The patient states that on September 14, 2010, he tripped over a rug and fell to the floor lacerating his scalp.  He was brought to [facility] for further management.  He was initially noted to be bradycardic.  He chronically takes metoprolol 100 mg daily for atrial fibrillation.  He was observed to have several nocturnal pauses up to 3.1 seconds in duration.  These were all nocturnal and without any accompanying symptoms.  His AV nodal agents have been discontinued and he has subsequently developed increasing ventricular rates with his atrial fibrillation.  He has a left bundle-branch block aberration with rapid ventricular rates during atrial fibrillation.  Due to his heavy alcohol consumption and propensity for falls, he has previously felt to be a poor candidate for Coumadin and therefore has been treated with a rate control strategy long-term.  Presently, he is resting comfortably and is without complaint.  He denies any fatigue, shortness of breath, dizziness, presyncope, or syncope.  He is otherwise without complaint at this time.  PAST MEDICAL HISTORY: 1. Longstanding persistent atrial fibrillation. 2. History of atrial flutter. 3. Coronary artery disease status post CABG. 4. Left bundle-branch block intermittently 5. History of prostate  cancer. 6. Hyperlipidemia.  PAST SURGICAL HISTORY: 1. CABG. 2. Seed implantation of the prostate. 3. Cataract surgery.  Medications are reviewed in the [facility].  ALLERGIES:  No known drug allergies.  SOCIAL HISTORY:  The patient is widowed.  He has a prior history of tobacco and currently continues to drink alcohol heavily.  FAMILY HISTORY:  Notable for cancer.  REVIEW OF SYSTEMS:  All systems reviewed and negative except as outlined in the HPI above.  PHYSICAL EXAMINATION:  Telemetry reveals atrial fibrillation with intermittent rapid ventricular rates. VITALS:  Blood pressure 130/75, respirations 18, sats 98% on room air, heart rate 74, afebrile. GENERAL:  The patient is an elderly male in no acute distress.  He is alert and oriented x3.  He is tremulous. HEENT:  He has a large laceration of his forehead, which is currently intact.  Sclerae clear.  Conjunctivae pink.  Oropharynx clear. NECK:  Supple.  JVP 8 cm. LUNGS:  Clear. HEART:  Irregularly irregular rhythm.  No murmurs, rubs, or gallops. GI:  Soft, nontender, and nondistended.  Positive bowel sounds. EXTREMITIES:  No clubbing or cyanosis.  He does have trace edema. SKIN:  Diffuse ecchymoses. MUSCULOSKELETAL:  No deformity or atrophy. PSYCHIATRIC:  Euthymic mood.  Full affect.  EKG reveals atrial fibrillation with a left bundle-branch block and ventricular  rate of 91 beats per minute.  LABORATORY DATA:  Magnesium 1.6, potassium 3.7, creatinine 0.6.  Echocardiogram from January 2011 reveals an ejection fraction of 50% to 55%.  IMPRESSION:  [name] is a 75 year old gentleman with a history of heavy alcohol consumption and longstanding persistent atrial fibrillation who now presents following a mechanical fall.  He has been observed on telemetry to have several pauses up to 3.1 seconds in duration; however, these have all been nocturnal pauses and completely asymptomatic.  He denies any symptoms of bradycardia at  this time.  I would therefore recommend that we resume AV nodal blocking agents for atrial fibrillation rate control.  I think that we should keep his heart rate less than 100.  He is not a candidate for rhythm control as he is unable to take Coumadin due to heavy alcohol consumption and falls.  PLAN:  I had a long discussion with the patient today regarding his bradycardia.  He is quite clear that he has been asymptomatic with bradycardia in the past.  I suspect that given his left bundle-branch block as well as an occasional incomplete right bundle-branch block that he does have intrinsic AV nodal conduction disease.  I therefore did discuss risks, benefits, and alternatives to pacemaker implantation today with the patient.  He is very clear that he wishes to avoid a pacemaker at this time.  I think that with his comorbidities, this would be a reasonable strategy.  We will therefore continue to titrate his metoprolol as tolerated.  I think that evaluation of nocturnal bradycardia should be considered.  This would include a sleep study in the outpatient setting to exclude sleep apnea as a possible cause for his nocturnal bradycardia.  In addition, I would recommend that we check a thyroid profile.  If the patient has symptomatic bradycardia in the future or any progression of his tachycardia bradycardia syndrome, then I would be happy to re-discuss pacemaker implantation with the patient at that time.  Presently, I think that once that his heart rate is rate controlled, he could be discharged with followup with Dr. Eldridge Dace.     Hillis Range, MD     JA/MEDQ  D:  09/18/2010  T:  09/19/2010  Job:  [id number]  cc:   Corky Crafts, MD  Electronically Signed by Hillis Range MD on 09/23/2010 04:50:16 PM

## 2010-09-24 ENCOUNTER — Ambulatory Visit (INDEPENDENT_AMBULATORY_CARE_PROVIDER_SITE_OTHER): Payer: Medicare Other | Admitting: *Deleted

## 2010-09-24 ENCOUNTER — Other Ambulatory Visit: Payer: Medicare Other

## 2010-09-24 DIAGNOSIS — Z4802 Encounter for removal of sutures: Secondary | ICD-10-CM

## 2010-09-24 DIAGNOSIS — E871 Hypo-osmolality and hyponatremia: Secondary | ICD-10-CM

## 2010-09-24 DIAGNOSIS — E876 Hypokalemia: Secondary | ICD-10-CM

## 2010-09-24 LAB — BASIC METABOLIC PANEL
BUN: 30 mg/dL — ABNORMAL HIGH (ref 6–23)
Calcium: 9.4 mg/dL (ref 8.4–10.5)
Creat: 1.04 mg/dL (ref 0.50–1.35)

## 2010-09-24 NOTE — Progress Notes (Signed)
Patient in for staple removal.  Fell at home last week and lacerated scalp.  Removed 8 staples from wound and cleaned with saline.  Lower part of wound was covered with a thick layer of dried blood so told patient that once old blood was washed off to let us know if he sees any additional staples (palpated and did not feel any).  Patient has a follow up appt with Dr. Sheffield Slider on 7/3.   Patient also was confused about his medications since some had been discontinued at the time of his d/c from hospital.  Reviewed his pink d/c instruction sheet and removed the following meds from his medication bag: Omeprazole 20 mg, Lisinopril 10 mg, Metoprolol 100 mg, HCTZ 12.5 mg and Lisinopril 20 mg.

## 2010-09-24 NOTE — Progress Notes (Signed)
Bmp done today [name] 

## 2010-09-27 ENCOUNTER — Other Ambulatory Visit: Payer: Self-pay | Admitting: Family Medicine

## 2010-09-27 NOTE — Telephone Encounter (Signed)
Refill request

## 2010-10-02 ENCOUNTER — Inpatient Hospital Stay: Payer: Medicare Other | Admitting: Family Medicine

## 2010-10-04 NOTE — Discharge Summary (Signed)
NAMEDICKEY, CAAMANO NO.:  [id number]  MEDICAL RECORD NO.:  [id number]  LOCATION:  4704                         FACILITY:  [facility]  PHYSICIAN:  Pearlean Brownie, M.D.DATE OF BIRTH:  1935/12/29  DATE OF ADMISSION:  09/14/2010 DATE OF DISCHARGE:  09/21/2010                              DISCHARGE SUMMARY   PRIMARY CARE PROVIDER:  Deniece Portela A. Sheffield Slider, MD at [facility].  DISCHARGE DIAGNOSES: 1. Chronic alcohol use. 2. Atrial fibrillation with rapid ventricular response. 3. Asymptomatic nocturnal pauses of heart rate. 4. Mild hyponatremia. 5. Hypertriglyceridemia. 6. Familial tremor. 7. Hypertension. 8. Coronary artery disease. 9. Left bundle-branch block. 10.Chronic diastolic heart failure. 11.Gastroesophageal reflux disease. 12.Right inguinal hernia. 13.Benign prostatic hypertrophy. 14.Mild anemia. 15.Left-sided scalp laceration. 16.Hypokalemia  and hypomagnesemia.  DISCHARGE MEDICATIONS: 1. Lisinopril 5 mg by mouth daily. 2. Metoprolol 75 mg by mouth twice daily. 3. Aspirin 81 mg by mouth daily. 4. Fish oil 1200 mg by mouth daily. 5. Multivitamin by mouth daily. 6. Nitroglycerin sublingual 0.4 mg sublingually every 5 as needed for     chest pain. 7. MiraLax 17 g by mouth daily at bedtime as needed. 8. Simvastatin 20 mg by mouth daily at bedtime. 9. Tramadol/acetaminophen 37.5/325 one to two tablets by mouth every 6     hours as needed for pain.  The patient was instructed to stop taking the following medications: 1. Lisinopril 20 mg. 2. Metoprolol XL 100 mg. 3. Hydrochlorothiazide 12.5 mg.  LABORATORY DATA AND STUDIES:  Labs on the date of admission: 1. INR performed on September 13, 2009, was 2.38. 2. CBC performed on September 14, 2010, showed white count 5.7, hemoglobin     10.3, platelets 100. 3. Basic metabolic panel obtained September 14, 2010, showed sodium of 130,     potassium 3, chloride 93, bicarb 22, glucose 92, BUN 9, creatinine  0.74. 4. Troponins obtained on September 14, 2010, were negative. 5. Ethanol level obtained on September 14, 2010, was 120. 6. Digoxin level obtained on September 14, 2010, was less than 0.3. 7. Comprehensive metabolic panel obtained on September 15, 2010,     demonstrated sodium 134, potassium 3.5, AST of 43, ALT of 30. 8. Magnesium obtained on September 15, 2010, was 1.5. 9. TSH obtained on September 19, 2010, was 1.467. 10.Free T4 obtained on September 19, 2010, was 1.28. 11.Basic metabolic obtained on September 21, 2010, showed sodium of 129,     potassium of 3.5, chloride 95, bicarb 24, glucose 93, BUN 24,     creatinine 0.79.  IMAGING: 1. Head CT obtained on September 14, 2010, showed no acute abnormality. 2. Chest x-ray obtained on September 15, 2010, showed mild left upper lobe     airspace disease infiltrate versus atelectasis.  BRIEF HOSPITAL COURSE:  [name] is a 75 year old male with a history of chronic alcohol abuse, coronary artery disease, AFib, who presented to the hospital with acute intoxication and a fall resulting in a left scalp laceration. 1. Alcohol intoxication:  Upon arrival to the emergency department,     the patient was bradycardic and hypotensive, as well as     intoxicated.  The patient was admitted to the  hospital, placed on     IV fluids, placed on CIWA protocol.  The patient required minimal     Ativan throughout his stay, and on day 5 of the hospitalization,     CIWA protocol was discontinued as the patient was not demonstrating     any sign of acute withdrawal.  The patient was discharged home with     instructions to avoid alcohol if at all possible. 2. Scalp laceration:  The patient initially presented to the emergency     department because he tripped and fell and sustained a scalp     laceration on the left side of his head.  He was evaluated with     head CT which was negative for intracranial bleed.  His head was     stapled in the emergency department.  The patient's bleeding was      stable and he did not experience any problems with his laceration     during his hospital stay problem. 3. Atrial fibrillation and pauses on telemetry:  The patient has a     long history of atrial fibrillation, is not on anticoagulation due     to his high fall risk.  Initially, presented to the emergency     department, he was bradycardic into the 40s.  Accordingly he was     admitted, his Toprol-XL was held, he was placed on telemetry.     During his hospital stay, he had several pauses was up to 3.5     seconds on telemetry.  These were asymptomatic.  He also had     approximately 14 beat run of V-tach also on telemetry.     Accordingly, Cardiology was consulted, and decided to resume     metoprolol on a b.i.d. dosing schedule.  As the patient had been     hypotensive initially and remained mildly so throughout his     hospitalization, the decision was also made to decrease the     patient's lisinopril from 20 mg daily to 5 mg daily.  The patient's     metoprolol was gradually titrated up as he did have problems with     RVR and with lesion when not on metoprolol, and he was finally     discharged on 75 mg of metoprolol b.i.d.  The patient had not     experienced any causes on telemetry for greater than 48 hours upon     discharge.  Multiple discussions were held slowly, the patient was     in the hospital, about the appropriateness of a pacemaker placement     in this patient.  Cardiology felt that this would likely be a good     idea, but also felt that the patient should be seen in the office     as an outpatient for this. 4. Hypokalemia:  The patient was hypokalemic throughout his     hospitalization and received routine potassium supplementation on a     daily basis.  The patient's potassium remained low even on the day     of discharge at 3.5 however:  This patient, considering his cardiac     risk factors, would be a potassium of approximately 4.  The patient     also had  problems with mild hyponatremia throughout the     hospitalization, Cardiology recommended discontinuing the patient's     hydrochlorothiazide on the day of discharge.  Accordingly, the     patient was discharged  with no hydrochlorothiazide.  As we were     changing this medication on the day of discharge, it was felt an     appropriate to send the patient home with potassium     supplementation.  The patient will be seen at the [facility] in 3 days for basic metabolic panel and staple     removal.  At that time, it can be determined whether or not the     potassium supplement is appropriate for the patient. 5. Hypertension:  Upon arrival at the hospital, the patient was mildly     hypotensive.  The patient's Toprol was held and he did not     experience significant problems with hypertension.  As the     patient's metoprolol was once more added back and titrated, the     patient did not have problems with hypotension, but also did not     become hypertensive.  As noted above, the patient's     hydrochlorothiazide was stopped on the day of discharge.  Followup     as an outpatient is recommended. 6. Hyponatremia:  The patient was mildly hyponatremic at the time of     admission and remained so throughout his hospitalization.  It is     unclear of the exact etiology of this patient's hyponatremia but,     per Cardiology recommendations on the day of discharge, his     hydrochlorothiazide was discontinued.  Further followup as an     outpatient as recommended.  DISCHARGE INSTRUCTIONS:  The patient was discharged home with instructions to resume activity slowly, eat a low-sodium heart-healthy diet, and return to [facility] in 3 days for staple removal.  The patient was also instructed to avoid alcoholic beverages.  ISSUES FOR FOLLOWUP: 1. An number of changes were made to the patient's antihypertensive     regimen.  Followup with patient's blood  pressure and addition of     medications where titration of dose is recommended. 2. It was felt that the patient was in the hospital that a pacer might     be appropriate given his history of tachybrady syndrome and he has     pauses on telemetry while at the hospital.  Cardiology is aware of     this and instructed the patient to follow up as an outpatient.     Coordination of this with his primary care provider is recommended. 3. Head laceration:  The patient is instructed to return to [facility] in 3 days for staple removal. 4. Hypokalemia:  As previously noted, the patient's goal potassium is     4.  The patient will return to [facility] in     3 days for a basic metabolic panel.  At this point in time, we feel     that it will be appropriate to determine if the patient has the     need for potassium supplementation. 5. Hyponatremia:  The patient was mildly hyponatremic treatment     throughout his hospitalization and has a history of hyponatremia.     Per Cardiology recommendations on the date of discharge, the     patient was discharged without hydrochlorothiazide.  Followup of     the patient's sodium is recommended.  FOLLOWUP APPOINTMENTS: 1. [facility] for staple removal and labs on  Monday September 24, 2010, at 11 a.m.2. Dr. Sheffield Slider at [facility] on October 02, 2010, at 2 p.m.    ______________________________ Marcus Homer, MD   ______________________________ Pearlean Brownie, M.D.    ER/MEDQ  D:  09/21/2010  T:  09/22/2010  Job:  [id number]  cc:   Deniece Portela A. Sheffield Slider, M.D. Corky Crafts, MD  Electronically Signed by Manuela Neptune MD on 09/22/2010 01:30:32 PM Electronically Signed by Pearlean Brownie M.D. on 10/04/2010 10:19:16 AM

## 2010-10-05 ENCOUNTER — Emergency Department (HOSPITAL_COMMUNITY): Payer: Medicare Other

## 2010-10-05 ENCOUNTER — Telehealth: Payer: Self-pay | Admitting: Family Medicine

## 2010-10-05 ENCOUNTER — Emergency Department (HOSPITAL_COMMUNITY)
Admission: EM | Admit: 2010-10-05 | Discharge: 2010-10-06 | Disposition: A | Discharge status: Home / Self Care | Payer: Medicare Other | Source: Home / Self Care | Attending: Emergency Medicine | Admitting: Emergency Medicine

## 2010-10-05 DIAGNOSIS — I252 Old myocardial infarction: Secondary | ICD-10-CM | POA: Insufficient documentation

## 2010-10-05 DIAGNOSIS — R112 Nausea with vomiting, unspecified: Secondary | ICD-10-CM | POA: Insufficient documentation

## 2010-10-05 DIAGNOSIS — I4891 Unspecified atrial fibrillation: Secondary | ICD-10-CM | POA: Insufficient documentation

## 2010-10-05 DIAGNOSIS — I10 Essential (primary) hypertension: Secondary | ICD-10-CM | POA: Insufficient documentation

## 2010-10-05 DIAGNOSIS — R109 Unspecified abdominal pain: Secondary | ICD-10-CM | POA: Insufficient documentation

## 2010-10-05 DIAGNOSIS — K7689 Other specified diseases of liver: Secondary | ICD-10-CM | POA: Insufficient documentation

## 2010-10-05 DIAGNOSIS — R339 Retention of urine, unspecified: Secondary | ICD-10-CM | POA: Insufficient documentation

## 2010-10-05 DIAGNOSIS — N390 Urinary tract infection, site not specified: Secondary | ICD-10-CM | POA: Insufficient documentation

## 2010-10-05 LAB — URINE MICROSCOPIC-ADD ON

## 2010-10-05 LAB — TYPE AND SCREEN

## 2010-10-05 LAB — CBC
Hemoglobin: 12.4 g/dL — ABNORMAL LOW (ref 13.0–17.0)
MCHC: 33.4 g/dL (ref 30.0–36.0)
RBC: 3.81 MIL/uL — ABNORMAL LOW (ref 4.22–5.81)
WBC: 8.5 10*3/uL (ref 4.0–10.5)

## 2010-10-05 LAB — COMPREHENSIVE METABOLIC PANEL
ALT: 20 U/L (ref 0–53)
Alkaline Phosphatase: 64 U/L (ref 39–117)
Chloride: 97 mEq/L (ref 96–112)
GFR calc Af Amer: >60 mL/min (ref >60)
Glucose, Bld: 85 mg/dL (ref 70–99)
Potassium: 3.7 mEq/L (ref 3.5–5.1)
Sodium: 136 mEq/L (ref 135–145)
Total Bilirubin: 1 mg/dL (ref 0.3–1.2)
Total Protein: 6.9 g/dL (ref 6.0–8.3)

## 2010-10-05 LAB — ABO/RH: ABO/RH(D): O POS

## 2010-10-05 LAB — URINALYSIS, ROUTINE W REFLEX MICROSCOPIC
Glucose, UA: NEGATIVE mg/dL
Leukocytes, UA: NEGATIVE
Protein, ur: NEGATIVE mg/dL
Urobilinogen, UA: 0.2 mg/dL (ref 0.0–1.0)

## 2010-10-05 LAB — DIFFERENTIAL
Basophils Absolute: 0 10*3/uL (ref 0.0–0.1)
Basophils Relative: 0 % (ref 0–1)
Lymphocytes Relative: 34 % (ref 12–46)
Monocytes Absolute: 0.4 10*3/uL (ref 0.1–1.0)
Neutro Abs: 5.1 10*3/uL (ref 1.7–7.7)
Neutrophils Relative %: 61 % (ref 43–77)

## 2010-10-05 MED ORDER — IOHEXOL 300 MG/ML  SOLN
100.0000 mL | Freq: Once | INTRAMUSCULAR | Status: AC | PRN
  Administered 2010-10-05: 100 mL via INTRAVENOUS

## 2010-10-05 NOTE — Telephone Encounter (Signed)
Pt calling to let MD know he is going to the ED, he spoke with Dr. Sheffield Slider this morning and was doing find but is now in a lot of pain & bleeding from his rectum.

## 2010-10-05 NOTE — Telephone Encounter (Signed)
I called [name] who says he didn't realize that he had an appt with me on July 3rd. He says he is feeling well and is not taking a potassium supplement. Since I am out of the office next week, I asked the staff to schedule him next Tuesday with Dr Louanne Belton who discharged him. The BMET done 3 days after discharge had a high potassium. He had had several blood pressure medications decreased during the hospitalization.

## 2010-10-05 NOTE — Telephone Encounter (Signed)
Since he spoke with Dr. Sheffield Slider this morning, he has started having diarrhea and bleeding in from his rectum and would like to know if it gets worse should he go to the ER.

## 2010-10-05 NOTE — Telephone Encounter (Signed)
Will ask Dr.Hale for advise .[name]

## 2010-10-05 NOTE — Telephone Encounter (Signed)
Called pt and spoke with niece Arline Asp. She reports, that he feels better now. Advised to take pt to ED, if sx's get worse. Also told Arline Asp, that Dr.Hale wants pt to be seen in ED. Lorenda Hatchet, Renato Battles

## 2010-10-07 ENCOUNTER — Encounter: Payer: Self-pay | Admitting: Family Medicine

## 2010-10-07 ENCOUNTER — Emergency Department (HOSPITAL_COMMUNITY): Payer: Medicare Other

## 2010-10-07 ENCOUNTER — Observation Stay (HOSPITAL_COMMUNITY)
Admission: EM | Admit: 2010-10-07 | Discharge: 2010-10-09 | Disposition: A | Discharge status: Home Health | Payer: Medicare Other | Attending: Family Medicine | Admitting: Family Medicine

## 2010-10-07 DIAGNOSIS — Z951 Presence of aortocoronary bypass graft: Secondary | ICD-10-CM | POA: Insufficient documentation

## 2010-10-07 DIAGNOSIS — I252 Old myocardial infarction: Secondary | ICD-10-CM | POA: Insufficient documentation

## 2010-10-07 DIAGNOSIS — R5383 Other fatigue: Secondary | ICD-10-CM | POA: Insufficient documentation

## 2010-10-07 DIAGNOSIS — R197 Diarrhea, unspecified: Secondary | ICD-10-CM | POA: Insufficient documentation

## 2010-10-07 DIAGNOSIS — Z79899 Other long term (current) drug therapy: Secondary | ICD-10-CM | POA: Insufficient documentation

## 2010-10-07 DIAGNOSIS — R339 Retention of urine, unspecified: Secondary | ICD-10-CM | POA: Insufficient documentation

## 2010-10-07 DIAGNOSIS — F1910 Other psychoactive substance abuse, uncomplicated: Secondary | ICD-10-CM | POA: Insufficient documentation

## 2010-10-07 DIAGNOSIS — R29898 Other symptoms and signs involving the musculoskeletal system: Secondary | ICD-10-CM

## 2010-10-07 DIAGNOSIS — K219 Gastro-esophageal reflux disease without esophagitis: Secondary | ICD-10-CM | POA: Insufficient documentation

## 2010-10-07 DIAGNOSIS — E46 Unspecified protein-calorie malnutrition: Secondary | ICD-10-CM | POA: Insufficient documentation

## 2010-10-07 DIAGNOSIS — I4891 Unspecified atrial fibrillation: Secondary | ICD-10-CM | POA: Insufficient documentation

## 2010-10-07 DIAGNOSIS — K449 Diaphragmatic hernia without obstruction or gangrene: Secondary | ICD-10-CM | POA: Insufficient documentation

## 2010-10-07 DIAGNOSIS — Z7982 Long term (current) use of aspirin: Secondary | ICD-10-CM | POA: Insufficient documentation

## 2010-10-07 DIAGNOSIS — I2510 Atherosclerotic heart disease of native coronary artery without angina pectoris: Secondary | ICD-10-CM | POA: Insufficient documentation

## 2010-10-07 DIAGNOSIS — I10 Essential (primary) hypertension: Secondary | ICD-10-CM | POA: Insufficient documentation

## 2010-10-07 DIAGNOSIS — I509 Heart failure, unspecified: Secondary | ICD-10-CM | POA: Insufficient documentation

## 2010-10-07 DIAGNOSIS — E7800 Pure hypercholesterolemia, unspecified: Secondary | ICD-10-CM | POA: Insufficient documentation

## 2010-10-07 DIAGNOSIS — D696 Thrombocytopenia, unspecified: Secondary | ICD-10-CM | POA: Insufficient documentation

## 2010-10-07 DIAGNOSIS — N138 Other obstructive and reflux uropathy: Secondary | ICD-10-CM | POA: Insufficient documentation

## 2010-10-07 DIAGNOSIS — R262 Difficulty in walking, not elsewhere classified: Secondary | ICD-10-CM | POA: Insufficient documentation

## 2010-10-07 DIAGNOSIS — R5381 Other malaise: Secondary | ICD-10-CM | POA: Insufficient documentation

## 2010-10-07 DIAGNOSIS — E876 Hypokalemia: Secondary | ICD-10-CM | POA: Insufficient documentation

## 2010-10-07 DIAGNOSIS — N401 Enlarged prostate with lower urinary tract symptoms: Secondary | ICD-10-CM | POA: Insufficient documentation

## 2010-10-07 DIAGNOSIS — R259 Unspecified abnormal involuntary movements: Secondary | ICD-10-CM | POA: Insufficient documentation

## 2010-10-07 LAB — HEPATIC FUNCTION PANEL
AST: 19 U/L (ref 0–37)
Albumin: 2.9 g/dL — ABNORMAL LOW (ref 3.5–5.2)
Alkaline Phosphatase: 57 U/L (ref 39–117)
Total Bilirubin: 2.3 mg/dL — ABNORMAL HIGH (ref 0.3–1.2)

## 2010-10-07 LAB — CBC
HCT: 27.6 % — ABNORMAL LOW (ref 39.0–52.0)
Hemoglobin: 9.4 g/dL — ABNORMAL LOW (ref 13.0–17.0)
MCH: 33.1 pg (ref 26.0–34.0)
MCH: 33.8 pg (ref 26.0–34.0)
MCHC: 34.1 g/dL (ref 30.0–36.0)
MCHC: 34.6 g/dL (ref 30.0–36.0)
MCV: 97.2 fL (ref 78.0–100.0)
MCV: 97.7 fL (ref 78.0–100.0)
Platelets: 107 10*3/uL — ABNORMAL LOW (ref 150–400)
RBC: 2.99 MIL/uL — ABNORMAL LOW (ref 4.22–5.81)

## 2010-10-07 LAB — COMPREHENSIVE METABOLIC PANEL
Albumin: 3 g/dL — ABNORMAL LOW (ref 3.5–5.2)
BUN: 11 mg/dL (ref 6–23)
Calcium: 8.4 mg/dL (ref 8.4–10.5)
Chloride: 99 mEq/L (ref 96–112)
Creatinine, Ser: 1.04 mg/dL (ref 0.50–1.35)
GFR calc non Af Amer: >60 mL/min (ref >60)
Total Bilirubin: 2.2 mg/dL — ABNORMAL HIGH (ref 0.3–1.2)

## 2010-10-07 LAB — URINALYSIS, ROUTINE W REFLEX MICROSCOPIC
Bilirubin Urine: NEGATIVE
Hgb urine dipstick: NEGATIVE
Ketones, ur: NEGATIVE mg/dL
Specific Gravity, Urine: 1.007 (ref 1.005–1.030)
pH: 6 (ref 5.0–8.0)

## 2010-10-07 LAB — VITAMIN B12: Vitamin B-12: 695 pg/mL (ref 211–911)

## 2010-10-07 LAB — DIFFERENTIAL
Basophils Relative: 0 % (ref 0–1)
Eosinophils Absolute: 0 10*3/uL (ref 0.0–0.7)
Eosinophils Relative: 0 % (ref 0–5)
Lymphs Abs: 0.5 10*3/uL — ABNORMAL LOW (ref 0.7–4.0)
Monocytes Absolute: 0.3 10*3/uL (ref 0.1–1.0)
Monocytes Relative: 7 % (ref 3–12)

## 2010-10-07 LAB — ETHANOL: Alcohol, Ethyl (B): <11 mg/dL (ref 0–11)

## 2010-10-07 LAB — IRON AND TIBC
Iron: 83 ug/dL (ref 42–135)
UIBC: 163 ug/dL

## 2010-10-07 LAB — CK TOTAL AND CKMB (NOT AT ARMC)
CK, MB: 1.3 ng/mL (ref 0.3–4.0)
Total CK: 28 U/L (ref 7–232)

## 2010-10-07 LAB — BASIC METABOLIC PANEL
BUN: 12 mg/dL (ref 6–23)
CO2: 27 mEq/L (ref 19–32)
Calcium: 7.9 mg/dL — ABNORMAL LOW (ref 8.4–10.5)
Creatinine, Ser: 0.96 mg/dL (ref 0.50–1.35)
Glucose, Bld: 138 mg/dL — ABNORMAL HIGH (ref 70–99)

## 2010-10-07 LAB — LIPASE, BLOOD: Lipase: 36 U/L (ref 11–59)

## 2010-10-07 LAB — PREALBUMIN: Prealbumin: 18 mg/dL (ref 17.0–34.0)

## 2010-10-07 LAB — AMMONIA: Ammonia: 24 umol/L (ref 11–60)

## 2010-10-07 NOTE — H&P (Signed)
Family Medicine Teaching [facility] Admission History and Physical  Patient name: [name] Medical record number: [id number] Date of birth: [birth year] Age: 75 y.o. Gender: male  Primary Care Provider: Tobin Chad, MD  Chief Complaint: LE weakness History of Present Illness: [name] is a 75 y.o. year old male w/ PMHx/o ETOH abuse, malnutrition, HTN, here with 2-3 day history of LE wekness. Pt states that he was in otherwise normal state of health. Around the evening of 10/05/10 pt states that he noticed significant weakness in LEs bilaterally. Pain persisted throughout the weekend. Pt denies any numbness, pain, bladder/bowel incontinence. No hemiparesis. This has never happened before per pt. Pt states that he does take a multivitamin daily. Pt denies any fevers. Pt noted to have been admitted 2-3 weeks ago for a fall. Pt denies any falls since hosptilal discharge. Pt states that he has not had any alcohol for last week. Normally drinks heavy liquor and occasional beer. Of note pt also seen in ED on 7/6 for abd pain/nausea/vomitnig. Was tentatively diagnosed with UTI and rxd cipro for coverage. Indwelling foley also apparently placed. Urine cxs were not known to be drawn.   PAST MEDICAL HISTORY:   1. Hypertriglyceridemia.   2. Mild hyponatremia.   3. Recurrent alcohol abuse.   4. Chronic depression.   5. Familial tremor.   6. Hypertension.   7. Coronary artery disease.   8. Left bundle-branch block.   9. Atrial fibrillation.   10.Chronic diastolic heart failure.   11.GERD.   12.Right inguinal hernia.   13.Benign prostatic hypertrophy.   14.History of prostate cancer.   15.History of pancreatitis.   16.Anemia.   17.Gait abnormalities.   18.History of transaminitis.      PAST SURGICAL HISTORY:   1. ORIF of metatarsal fracture.   2. Right cataract excision.   3. Prostate biopsy.   4. Recurrent CABG, most recently in 2009.   5. Insertion of prostate radiation seed  in 2011.   Social History: He is a widowed, former smoker, current alcohol   drinker.  No other drugs.   Family History: Family History  Problem Relation Age of Onset  . Cancer Father   . Cancer Sister     Allergies: Allergies not on file  Medications:   1. Lisinopril 5 mg by mouth daily.   2. Metoprolol 75 mg by mouth twice daily.   3. Aspirin 81 mg by mouth daily.   4. Fish oil 1200 mg by mouth daily.   5. Multivitamin by mouth daily.   6. Nitroglycerin sublingual 0.4 mg sublingually every 5 as needed for       chest pain.   7. MiraLax 17 g by mouth daily at bedtime as needed.   8. Simvastatin 20 mg by mouth daily at bedtime.   9. Tramadol/acetaminophen 37.5/325 one to two tablets by mouth every 6       hours as needed for pain.   Review Of Systems: Per HPI with the following additions:  Otherwise 12 point review of systems was performed and was unremarkable.  Physical Exam: Pulse: 70s  Blood Pressure: 118-130/60s    RR: 12   O2: 100% on RA Temp: 99.4 General: alert and cooperative HEENT: PERRLA, extra ocular movement intact and trace scleral icterus Heart: Irregularly, irregular, no rubs, gallops, murmurs Lungs: clear to auscultation, no wheezes or rales and unlabored breathing Abdomen: abdomen is soft without significant tenderness, masses, organomegaly or guarding Extremities: extremities normal, atraumatic, no cyanosis or  edema Skin:mild venous stasis in LEs bilaterally.  Neurology: Alert, oriented x 3, CN II-XII grossly intact; decreased strength in LEs diffusely, pt reports inability to get.   Labs and Imaging: Lab Results  Component Value Date/Time   NA 138 10/07/2010 12:14 AM   K 3.6 10/07/2010 12:14 AM   CL 99 10/07/2010 12:14 AM   CO2 27 10/07/2010 12:14 AM   BUN 11 10/07/2010 12:14 AM   CREATININE 1.04 10/07/2010 12:14 AM   CREATININE 1.04 09/24/2010 11:11 AM   GLUCOSE 152* 10/07/2010 12:14 AM   Lab Results  Component Value Date   WBC 5.0 10/07/2010   HGB 10.1*  10/07/2010   HCT 29.2* 10/07/2010   MCV 97.7 10/07/2010   PLT 107* 10/07/2010   EKG: Afib, LBBB, no acute ST/T wave abnormalities Head CT: Diffuse atrophy and small vessel ischemic change.  No evidence of   acute intracranial hemorrhage, mass lesion, or acute infarct. CXR: no acute cardiopulmonary disease.    Assessment and Plan: [name] is a 75 y.o. year old male presenting with LE weakness Neuro: Overall relatively broad differential for LE weakness, though I suspect a high contribution of alcoholism and possible secondary dry beriberi in presentation. Will also check ESR, A1C, CPK level to rule out other sources of weakness. Will c/s PT/OT. No bowel/bladder incontinence which is reassuring. Good perfusion to distal extremities. May consider neuro c/s sxs persist despite workup.  -CIWA protocol for ETOH withdrawal -Pt noted to have baseline tremor on exam with possible mild asterixis. Will also check an ammonia level to rule out encephalopathy.   CV: Overall clinically stable. No noted CP or orthopnea on presentation. EKG with no acute changes. Pt noted to have been held off of some CV medications at last admission secondary to HTN and electrolytes. Will continue with discharge medications.   Renal: Will d/c current foley with plan for PVR bladder scan. May consider flomax if indication. Will check Mg level. NS @ KVO.   GI: baseline hx/o alcoholic disease. Will check hepatic function panel. Will place on CIWA protocol. Heart healthy, carb modified diet. Protonix.   Heme: Baseline anemia. Likely of chronic disease. Baseline hgb @ 9-10. 10.1 today. Anemia panel ordered.   Prophylaxis: heparin sub q  Disposition: Pending further evaluation.

## 2010-10-08 ENCOUNTER — Encounter: Payer: Self-pay | Admitting: Internal Medicine

## 2010-10-08 DIAGNOSIS — R5383 Other fatigue: Secondary | ICD-10-CM

## 2010-10-08 DIAGNOSIS — N139 Obstructive and reflux uropathy, unspecified: Secondary | ICD-10-CM

## 2010-10-08 DIAGNOSIS — E876 Hypokalemia: Secondary | ICD-10-CM

## 2010-10-08 DIAGNOSIS — F1020 Alcohol dependence, uncomplicated: Secondary | ICD-10-CM

## 2010-10-08 DIAGNOSIS — R5381 Other malaise: Secondary | ICD-10-CM

## 2010-10-08 LAB — CBC
HCT: 27.6 % — ABNORMAL LOW (ref 39.0–52.0)
MCH: 33.6 pg (ref 26.0–34.0)
MCHC: 34.1 g/dL (ref 30.0–36.0)
MCV: 98.6 fL (ref 78.0–100.0)
Platelets: 69 10*3/uL — ABNORMAL LOW (ref 150–400)
RDW: 12.5 % (ref 11.5–15.5)
WBC: 3.5 10*3/uL — ABNORMAL LOW (ref 4.0–10.5)

## 2010-10-08 LAB — URINE CULTURE
Colony Count: NO GROWTH
Culture: NO GROWTH

## 2010-10-08 LAB — BASIC METABOLIC PANEL
BUN: 14 mg/dL (ref 6–23)
Calcium: 8.1 mg/dL — ABNORMAL LOW (ref 8.4–10.5)
Chloride: 108 mEq/L (ref 96–112)
Creatinine, Ser: 0.9 mg/dL (ref 0.50–1.35)
GFR calc Af Amer: >60 mL/min (ref >60)

## 2010-10-08 LAB — VITAMIN B12: Vitamin B-12: 713 pg/mL (ref 211–911)

## 2010-10-09 ENCOUNTER — Other Ambulatory Visit: Payer: Self-pay | Admitting: Family Medicine

## 2010-10-09 LAB — BASIC METABOLIC PANEL
BUN: 16 mg/dL (ref 6–23)
CO2: 25 mEq/L (ref 19–32)
Calcium: 7.8 mg/dL — ABNORMAL LOW (ref 8.4–10.5)
Chloride: 106 mEq/L (ref 96–112)
Creatinine, Ser: 0.73 mg/dL (ref 0.50–1.35)

## 2010-10-10 NOTE — Telephone Encounter (Signed)
Refill request

## 2010-10-11 LAB — VITAMIN B1: Vitamin B1 (Thiamine): 12 nmol/L (ref 9–44)

## 2010-10-11 NOTE — Discharge Summary (Signed)
[name], [name] NO.:  [id number]  MEDICAL RECORD NO.:  [id number]  LOCATION:  [facility]                         FACILITY:  [facility]  PHYSICIAN:  Leighton Roach Ezmeralda Stefanick, M.D.DATE OF BIRTH:  September 24, 1935  DATE OF ADMISSION:  10/07/2010 DATE OF DISCHARGE:  10/09/2010                              DISCHARGE SUMMARY   PRIMARY CARE PHYSICIAN:  [name], MD, at the [facility].  CONSULTS:  None.  REASON FOR ADMISSION:  Lower extremity weakness.  DISCHARGE DIAGNOSES:   Primary:   Lower extremity weakness.  Secondary: 1. Urinary retention. 2. Thrombocytopenia. 3. Hypokalemia  DISCHARGE MEDICATIONS:   New: 1. Acidophilus/bulgaricus pack 1 packet 3 times daily with meals. 2. Folic acid 1 mg 1 tablet daily. 3. Klor-Con 20 mEq 1 tablet daily. 4. Loperamide 2 mg 1 tablet as needed. 5. Pantoprazole 40 mg 1 tablet daily. 6. Tamsulosin 0.4 mg 1 tablet daily. 7. Thiamine 100 mg 1 tablet daily.  Continued: 1. Aspirin 81 mg daily. 2. Fish oil 1200 mg 1 tablet daily. 3. Lisinopril 5 mg 1 tablet daily. 4. Multivitamin 1 tablet daily. 5. Metoprolol 50 mg 1-1/2 tablets twice daily. 6. Nitroglycerin SL 0.4 mg under the tongue every 5 minutes as needed     for up to 3 doses. 7. Simvastatin 20 mg 1 tablet daily at bedtime. 8. Tramadol/aspirin 37.5/325 mg 1-2 tablets every 6 hours as needed.  HOSPITAL COURSE:  [name] is a 75 year old man with multiple medical problems including alcohol abuse and malnutrition presenting with a 2-3 day history of lower extremity weakness. 1. Lower extremity weakness.  This is likely secondary to     deconditioning, alcohol abuse, and malnutrition.  He was evaluated     in the ED with a head CT that showed no acute changes.  His     weakness improved throughout this hospitalization.  Given this     improvement and lack of focal neurologic deficits, Neurology was     not consulted.  Physical Therapy evaluated him and  recommended home     health physical therapy.  I did emphasize with the patient the     importance of alcohol cessation, good p.o. intake as well as     getting out of bed. 2. Urinary retention.  There was unclear history of having been     diagnosed with a UTI in the ED several days prior to admission and     having a Foley placed at that time.  On admission, his Foley was     replaced and then with further discussion with the patient it was     discovered that he was never symptomatic for UTI and his microscopy     never showed a white cells, so we discontinued his antibiotics.  On     day #2 of this admission, he was started on Flomax for his history     of BPH and urinary retention and his Foley was removed.  A postvoid     bladder scan after removal of the Foley showed 38 mL of urine. 3. Thrombocytopenia.  This appears to be have a chronic problem.  As  his platelets were low on admission and dropped over the day, his     prophylactic heparin was stopped and he was started on spontaneous     compressions instead.  Peripheral smear revealed low number of     platelets with no schistocytes.  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 Electrolytes:  He required multiple doses of potassium to replete     his potassium level, so we also gave him magnesium 2 g IV x1. 5. Alcohol abuse.  He was started on the CIWA protocol and he did not     require any Ativan during this hospitalization and Social Work was     consulted to discuss rehab options with him.  DISCHARGE INSTRUCTIONS:  He was instructed to avoid alcohol and encouraged to participate with home health physical therapy to get stronger and if symptoms should return he is to call the [facility].  CONDITION ON DISCHARGE:  Stable.  PENDING TESTS:  None.  DISPOSITION:  Home with home health aide and home health  physical therapy.  DISCHARGE FOLLOWUP:  With Dr. Louanne Belton at the [facility] on October 19, 2010, at 3:15 p.m.  FOLLOWUP ISSUES: 1. Given his recent hospitalization for falls and weakness, please     discuss with him how rehab is going and review alcohol abuse and     his nutritional status and please go over his medications as well. 2. As we started him on Flomax during this hospitalization, please     review any urinary symptoms and any signs of orthostatics. 3. Consider rechecking a potassium as it was difficult to replete     during the hospitalization.    ______________________________ Despina Hick, MD   ______________________________ Leighton Roach Rada Zegers, M.D.    EB/MEDQ  D:  10/10/2010  T:  10/11/2010  Job:  [id number]  cc:   [name], M.D.  Electronically Signed by Despina Hick MD on 10/11/2010 04:13:25 PM Electronically Signed by Acquanetta Belling M.D. on 10/11/2010 33:29:51 PM

## 2010-10-15 ENCOUNTER — Telehealth: Payer: Self-pay | Admitting: Family Medicine

## 2010-10-15 NOTE — Telephone Encounter (Signed)
Order for stationary commode signed and faxed back to [facility]

## 2010-10-18 ENCOUNTER — Inpatient Hospital Stay: Payer: Medicare Other | Admitting: Family Medicine

## 2010-10-19 ENCOUNTER — Inpatient Hospital Stay: Payer: Medicare Other | Admitting: Family Medicine

## 2010-11-22 NOTE — H&P (Signed)
[name], Marcus NO.:  [id number]  MEDICAL RECORD NO.:  [id number]  LOCATION:  [facility]                         FACILITY:  [facility]  PHYSICIAN:  Paula Compton, MD        DATE OF BIRTH:  [birth year]  DATE OF ADMISSION:  10/07/2010 DATE OF DISCHARGE:                             HISTORY & PHYSICAL   PRIMARY CARE PROVIDER:  [name], MD  CHIEF COMPLAINT:  Lower extremity weakness.  HISTORY OF PRESENT ILLNESS:  This is a 75 year old male with past medical history of alcohol abuse, malnutrition and hypertension here with a 2-3 day history of lower extremity weakness.  The patient says that he has been otherwise at baseline state of health prior to presentation, however, around the evening of October 05, 2010, the patient states he noted significant weakness of his lower extremities bilaterally.  Pain persisted throughout the weekend.  The patient denies any numbness, pain, bowel or bladder incontinence associated with this. No hemiparesis and this has never happened before per the patient.  The patient states he does take a multivitamin daily as he was instructed to do so because of his alcohol disease.  The patient denies any fevers. The patient has been admitted 2-3 weeks here for a fall.  The patient denies any falls since hospital discharge.  The patient states that he has not had any alcohol abuse for the last week.  The patient normally drinks heavy liquor and occasional beer.  Of note, the patient was seen in the ED on October 05, 2010, for abdominal pain, nausea, vomiting and was tentatively diagnosed with UTI and was prescribed ciprofloxacin for coverage.  An indwelling Foley catheter was apparently placed.  Urine cultures were not known to be drawn.  PAST MEDICAL HISTORY: 1. Hyperlipidemia. 2. History of mild hyponatremia. 3. Recurrent alcohol abuse. 4. Chronic depression. 5. Familial tremor. 6. Hypertension. 7. Coronary artery disease. 8. Left  bundle-branch block. 9. AFib. 10.Chronic diastolic heart failure. 11.GERD. 12.Right inguinal hernia. 13.BPH. 14.History of prostate cancer. 15.History of pancreatitis. 16.Anemia. 17.Gait abnormalities. 18.History of transaminitis  PAST SURGERIES: 1. Open reduction and internal fixation of metatarsal fracture. 2. Right cataract excision. 3. Prostate biopsy. 4. Recurrent CABG most recent in 2009.  ALLERGIES:  NKDA.  SOCIAL HISTORY:  The patient is widowed, a former smoker and alcohol use.  Denies marijuana or other drug use per the patient.  FAMILY HISTORY:  Positive for cancer in father and sister.  MEDICATIONS: 1. Lisinopril 5 mg p.o. daily. 2. Metoprolol 75 mg p.o. b.i.d. 3. Aspirin 81 mg p.o. daily. 4. Fish oil 1200 mg p.o. daily. 5. Multivitamin. 6. Nitroglycerin 0.4 mg sublingual every 5 as needed x3 for chest     pain. 7. MiraLax 17 g 1 cap full by mouth daily p.r.n. 8. Simvastatin 20 mg p.o. daily. 9. Tramadol/Ultracet 37.5/325 1-2 tablets p.o. q.6 h. p.r.n. pain.  REVIEW OF SYSTEMS:  Negative except as noted above in HPI.  PHYSICAL EXAMINATION:  VITAL SIGNS:  Temperature 99.4, heart rate in the 70s, respirations 12, blood pressure 118-130 over 60, satting 100% on room air. GENERAL:  The patient is in bed, alert and cooperative. HEENT:  Normocephalic, atraumatic.  Extraocular movements intact.  Mild trace scleral icterus. HEART:  Irregularly irregular.  No rubs, gallops, or murmurs. LUNGS:  Clear to auscultation.  No wheezes, rales or unlabored breathing. ABDOMEN:  Soft, nontender.  No masses or organomegaly or guarding. EXTREMITIES:  Normal, atraumatic.  No cyanosis or edema. SKIN:  Mild venous stasis changes in lower extremities bilaterally. NEURO:  The patient is alert and oriented x3.  Cranial nerves II-XII grossly intact.  Decreased strength in lower extremities diffusely.  The patient reports inability to  actually get up.  Sensation is intact  in distal extremities as well as good overall perfusion.  LABS AND STUDIES: 1. BMET:  Sodium 138, potassium 3.6, chloride 99, CO2 27, BUN 11,     creatinine 1.04.  Glucose of 152. 2. CBC:  White count 5.0, hemoglobin 10.1, hematocrit 29.1, platelet     count of 107, MCV 97.7. 3. EKG showing AFib with left bundle-branch block.  No acute ST-T wave     abnormalities. 4. Head CT showing diffuse atrophy and small-vessel ischemic change.     No evidence of acute intracranial hemorrhage, mass lesion or acute     infarct.  Chest x-ray showing no acute cardiopulmonary disease.  ASSESSMENT AND PLAN:  Mr. Poli is a 75 year old male with multiple comorbidities, alcohol abuse and malnutrition presenting with lower extremity weakness. 1. Neuro:  Overall, this was relatively a broad differential for this     patient's lower extremity weakness through I suspect there is a     high contribution of alcoholism and possibly secondary __________     on presentation.  A thiamine  level will be checked.  We will also     check sed rate, A1c, CPK levels to rule out other source of     weakness.  We will consult PT, OT.  The patient is noted to live at  home and may benefit from physical evaluation.  No bowel or bladder     incontinence which is also reassuring.  Perfusion to his lower     extremities.  May consider Neuro consult if symptoms persist     despite workup.  We will place the patient on CIWA protocol as well     for alcohol withdrawal. The patient is noted to have a baseline     tremor on exam with possible mild asterixis.  We will check ammonia     level to rule out encephalopathy correlate. 2. Cardiovascular: The patient is overall clinically stable.  No chest     pain or orthopnea on presentation.  EKG with no acute changes.  The     patient is noted to have hair loss from his cardiovascular     medications in last admission secondary to hypertension and     electrolytes.  We will continue  with these medications while in-     house. 3. Renal.  The patient is noted to have a Foley catheter placed at     most recent ED visit 2-3 days ago with potential outpatient     followup.  It is unclear the overall exact etiology of this issue.     We will continue with Foley catheter while the patient is here and     then consider a postvoid residual after 24 hours to see if the     patient may benefit of this and also may consider Flomax as     indicated.  We will check a mag level,  normal saline at [facility]. 4. GI:  The patient has a baseline history of alcoholic disease.  We     will check hepatic function panel. We will place him on CIWA     protocol, heart healthy carb-modified diet and Protonix. 5. Baseline history of anemia, likely anemia of chronic disease with a     baseline hemoglobin of 9-10.  The patient's hemoglobin is 7.1     today.  An anemia panel has been ordered.  We will follow up. 6. Prophylaxis.  Heparin subcu.  Disposition pending further     evaluation.     Doree Albee, MD   ______________________________ Paula Compton, MD    SN/MEDQ  D:  10/07/2010  T:  10/08/2010  Job:  [id number]  Electronically Signed by Doree Albee  on 11/19/2010 02:37:57 PM Electronically Signed by Paula Compton MD on 11/22/2010 08:21:59 AM

## 2010-12-05 ENCOUNTER — Other Ambulatory Visit: Payer: Self-pay | Admitting: Family Medicine

## 2010-12-05 NOTE — Telephone Encounter (Signed)
Refill request

## 2010-12-20 LAB — POCT I-STAT CREATININE: Operator id: 294511

## 2010-12-20 LAB — CBC
HCT: 38.1 — ABNORMAL LOW
Hemoglobin: 13.4
WBC: 4.6

## 2010-12-20 LAB — LIPID PANEL
LDL Cholesterol: 58
Total CHOL/HDL Ratio: 1.8
VLDL: 18

## 2010-12-20 LAB — CARDIAC PANEL(CRET KIN+CKTOT+MB+TROPI)
CK, MB: 1.2
Relative Index: INVALID
Total CK: 33
Troponin I: 0.02

## 2010-12-20 LAB — I-STAT 8, (EC8 V) (CONVERTED LAB)
Acid-Base Excess: 1
Chloride: 94 — ABNORMAL LOW
HCT: 42
Operator id: 294511
Potassium: 4.2
Sodium: 127 — ABNORMAL LOW
pCO2, Ven: 45.6

## 2010-12-20 LAB — POCT CARDIAC MARKERS
Myoglobin, poc: 94.2
Troponin i, poc: <0.05

## 2010-12-20 LAB — URINALYSIS, ROUTINE W REFLEX MICROSCOPIC
Bilirubin Urine: NEGATIVE
Glucose, UA: NEGATIVE
Hgb urine dipstick: NEGATIVE
Protein, ur: NEGATIVE
Urobilinogen, UA: 0.2

## 2010-12-20 LAB — RAPID URINE DRUG SCREEN, HOSP PERFORMED
Amphetamines: NOT DETECTED
Barbiturates: NOT DETECTED
Benzodiazepines: NOT DETECTED
Cocaine: NOT DETECTED

## 2010-12-20 LAB — CK TOTAL AND CKMB (NOT AT ARMC)
CK, MB: 1.5
Relative Index: INVALID

## 2010-12-20 LAB — DIFFERENTIAL
Eosinophils Relative: 2
Lymphocytes Relative: 46
Lymphs Abs: 2.1
Monocytes Absolute: 0.5
Monocytes Relative: 12

## 2010-12-20 LAB — AMMONIA: Ammonia: 32

## 2010-12-20 LAB — COMPREHENSIVE METABOLIC PANEL
ALT: 40
AST: 63 — ABNORMAL HIGH
Albumin: 3.1 — ABNORMAL LOW
CO2: 21
Calcium: 8.2 — ABNORMAL LOW
Chloride: 96
GFR calc Af Amer: >60
GFR calc non Af Amer: >60
Sodium: 131 — ABNORMAL LOW

## 2010-12-26 LAB — VITAMIN B12: Vitamin B-12: 1148 — ABNORMAL HIGH (ref 211–911)

## 2010-12-26 LAB — BASIC METABOLIC PANEL
BUN: 6
CO2: 24
CO2: 24
Calcium: 7.7 — ABNORMAL LOW
Chloride: 101
GFR calc Af Amer: >60
GFR calc non Af Amer: >60
Glucose, Bld: 127 — ABNORMAL HIGH
Glucose, Bld: 92
Potassium: 3.4 — ABNORMAL LOW
Potassium: 4.5
Sodium: 133 — ABNORMAL LOW
Sodium: 137

## 2010-12-26 LAB — COMPREHENSIVE METABOLIC PANEL
BUN: 10
Calcium: 8.1 — ABNORMAL LOW
Creatinine, Ser: 0.93
GFR calc non Af Amer: >60
Glucose, Bld: 119 — ABNORMAL HIGH
Sodium: 125 — ABNORMAL LOW
Total Protein: 6.1

## 2010-12-26 LAB — DIFFERENTIAL
Lymphocytes Relative: 28
Lymphs Abs: 1.5
Monocytes Relative: 5
Neutro Abs: 3.7
Neutrophils Relative %: 67

## 2010-12-26 LAB — CK TOTAL AND CKMB (NOT AT ARMC)
CK, MB: 3.1
Relative Index: INVALID
Total CK: 38

## 2010-12-26 LAB — CBC
HCT: 27.6 — ABNORMAL LOW
HCT: 34.8 — ABNORMAL LOW
Hemoglobin: 10.3 — ABNORMAL LOW
Hemoglobin: 12.3 — ABNORMAL LOW
Hemoglobin: 9.9 — ABNORMAL LOW
MCHC: 34.3
MCHC: 35.4
MCHC: 35.8
MCV: 98
Platelets: 119 — ABNORMAL LOW
RBC: 3.01 — ABNORMAL LOW
RDW: 13.4
RDW: 14.1

## 2010-12-26 LAB — RAPID URINE DRUG SCREEN, HOSP PERFORMED
Amphetamines: NOT DETECTED
Benzodiazepines: NOT DETECTED
Cocaine: NOT DETECTED
Opiates: NOT DETECTED
Tetrahydrocannabinol: NOT DETECTED

## 2010-12-26 LAB — CARDIAC PANEL(CRET KIN+CKTOT+MB+TROPI)
CK, MB: 2.8
Relative Index: INVALID
Relative Index: INVALID
Total CK: 43
Total CK: 45

## 2010-12-26 LAB — IRON AND TIBC
Iron: 90
Saturation Ratios: 40
TIBC: 227

## 2010-12-26 LAB — FOLATE: Folate: >20

## 2010-12-26 LAB — HEPARIN LEVEL (UNFRACTIONATED): Heparin Unfractionated: 0.91 — ABNORMAL HIGH

## 2011-02-11 ENCOUNTER — Other Ambulatory Visit: Payer: Self-pay | Admitting: Family Medicine

## 2011-02-12 NOTE — Telephone Encounter (Signed)
Refill request

## 2011-02-13 ENCOUNTER — Encounter: Payer: Self-pay | Admitting: Home Health Services

## 2011-03-29 ENCOUNTER — Other Ambulatory Visit: Payer: Self-pay

## 2011-03-29 ENCOUNTER — Emergency Department (HOSPITAL_COMMUNITY)
Admission: EM | Admit: 2011-03-29 | Discharge: 2011-03-29 | Disposition: A | Discharge status: Home / Self Care | Payer: Medicare Other | Attending: Emergency Medicine | Admitting: Emergency Medicine

## 2011-03-29 ENCOUNTER — Encounter (HOSPITAL_COMMUNITY): Payer: Self-pay | Admitting: Emergency Medicine

## 2011-03-29 DIAGNOSIS — I252 Old myocardial infarction: Secondary | ICD-10-CM | POA: Insufficient documentation

## 2011-03-29 DIAGNOSIS — I499 Cardiac arrhythmia, unspecified: Secondary | ICD-10-CM | POA: Insufficient documentation

## 2011-03-29 DIAGNOSIS — G8929 Other chronic pain: Secondary | ICD-10-CM | POA: Insufficient documentation

## 2011-03-29 DIAGNOSIS — F329 Major depressive disorder, single episode, unspecified: Secondary | ICD-10-CM | POA: Insufficient documentation

## 2011-03-29 DIAGNOSIS — M549 Dorsalgia, unspecified: Secondary | ICD-10-CM | POA: Insufficient documentation

## 2011-03-29 DIAGNOSIS — Z8546 Personal history of malignant neoplasm of prostate: Secondary | ICD-10-CM | POA: Insufficient documentation

## 2011-03-29 DIAGNOSIS — F3289 Other specified depressive episodes: Secondary | ICD-10-CM | POA: Insufficient documentation

## 2011-03-29 DIAGNOSIS — T1490XA Injury, unspecified, initial encounter: Secondary | ICD-10-CM | POA: Insufficient documentation

## 2011-03-29 DIAGNOSIS — Y92009 Unspecified place in unspecified non-institutional (private) residence as the place of occurrence of the external cause: Secondary | ICD-10-CM | POA: Insufficient documentation

## 2011-03-29 DIAGNOSIS — W19XXXA Unspecified fall, initial encounter: Secondary | ICD-10-CM | POA: Insufficient documentation

## 2011-03-29 LAB — CBC
HCT: 35.9 % — ABNORMAL LOW (ref 39.0–52.0)
Platelets: 131 10*3/uL — ABNORMAL LOW (ref 150–400)
RBC: 3.59 MIL/uL — ABNORMAL LOW (ref 4.22–5.81)
RDW: 12.2 % (ref 11.5–15.5)
WBC: 2.5 10*3/uL — ABNORMAL LOW (ref 4.0–10.5)

## 2011-03-29 LAB — DIFFERENTIAL
Basophils Absolute: 0 10*3/uL (ref 0.0–0.1)
Lymphocytes Relative: 32 % (ref 12–46)
Lymphs Abs: 0.8 10*3/uL (ref 0.7–4.0)
Monocytes Absolute: 0.3 10*3/uL (ref 0.1–1.0)
Neutro Abs: 1.4 10*3/uL — ABNORMAL LOW (ref 1.7–7.7)

## 2011-03-29 LAB — COMPREHENSIVE METABOLIC PANEL
ALT: 32 U/L (ref 0–53)
AST: 37 U/L (ref 0–37)
Alkaline Phosphatase: 79 U/L (ref 39–117)
CO2: 21 mEq/L (ref 19–32)
Chloride: 97 mEq/L (ref 96–112)
GFR calc non Af Amer: 89 mL/min — ABNORMAL LOW (ref >90)
Glucose, Bld: 72 mg/dL (ref 70–99)
Sodium: 134 mEq/L — ABNORMAL LOW (ref 135–145)
Total Bilirubin: 1 mg/dL (ref 0.3–1.2)
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
## 2011-03-29 NOTE — ED Notes (Signed)
Patient fall on the way to bath room, could not get up had to crawl to phone, c/o weakness, patients states over last year has been stumbling and falling a lot now uses a cane

## 2011-03-29 NOTE — ED Notes (Signed)
YNW:[id number]<OZ> Expected date:03/29/11<BR> Expected time: 7:58 AM<BR> Means of arrival:Ambulance<BR> Comments:<BR> ETOH, fall

## 2011-03-29 NOTE — ED Notes (Signed)
Patient verbalized understanding of discharge instructions.  ?

## 2011-03-29 NOTE — ED Notes (Signed)
Patient is calling him a ride. Patient stood with md and used urinal with no problems.

## 2011-04-08 ENCOUNTER — Emergency Department (HOSPITAL_COMMUNITY)
Admission: EM | Admit: 2011-04-08 | Discharge: 2011-04-09 | Disposition: A | Discharge status: Home / Self Care | Payer: Medicare Other | Attending: Emergency Medicine | Admitting: Emergency Medicine

## 2011-04-08 ENCOUNTER — Encounter (HOSPITAL_COMMUNITY): Payer: Self-pay | Admitting: Adult Health

## 2011-04-08 ENCOUNTER — Emergency Department (HOSPITAL_COMMUNITY): Payer: Medicare Other

## 2011-04-08 ENCOUNTER — Other Ambulatory Visit: Payer: Self-pay

## 2011-04-08 DIAGNOSIS — S51009A Unspecified open wound of unspecified elbow, initial encounter: Secondary | ICD-10-CM | POA: Insufficient documentation

## 2011-04-08 DIAGNOSIS — F10929 Alcohol use, unspecified with intoxication, unspecified: Secondary | ICD-10-CM

## 2011-04-08 DIAGNOSIS — I2510 Atherosclerotic heart disease of native coronary artery without angina pectoris: Secondary | ICD-10-CM | POA: Insufficient documentation

## 2011-04-08 DIAGNOSIS — K861 Other chronic pancreatitis: Secondary | ICD-10-CM | POA: Insufficient documentation

## 2011-04-08 DIAGNOSIS — I252 Old myocardial infarction: Secondary | ICD-10-CM | POA: Insufficient documentation

## 2011-04-08 DIAGNOSIS — M25559 Pain in unspecified hip: Secondary | ICD-10-CM | POA: Insufficient documentation

## 2011-04-08 DIAGNOSIS — R51 Headache: Secondary | ICD-10-CM | POA: Insufficient documentation

## 2011-04-08 DIAGNOSIS — M1990 Unspecified osteoarthritis, unspecified site: Secondary | ICD-10-CM | POA: Insufficient documentation

## 2011-04-08 DIAGNOSIS — M542 Cervicalgia: Secondary | ICD-10-CM | POA: Insufficient documentation

## 2011-04-08 DIAGNOSIS — F1010 Alcohol abuse, uncomplicated: Secondary | ICD-10-CM

## 2011-04-08 DIAGNOSIS — R1013 Epigastric pain: Secondary | ICD-10-CM | POA: Insufficient documentation

## 2011-04-08 DIAGNOSIS — Y92009 Unspecified place in unspecified non-institutional (private) residence as the place of occurrence of the external cause: Secondary | ICD-10-CM | POA: Insufficient documentation

## 2011-04-08 DIAGNOSIS — R4789 Other speech disturbances: Secondary | ICD-10-CM | POA: Insufficient documentation

## 2011-04-08 DIAGNOSIS — M546 Pain in thoracic spine: Secondary | ICD-10-CM | POA: Insufficient documentation

## 2011-04-08 DIAGNOSIS — I4891 Unspecified atrial fibrillation: Secondary | ICD-10-CM

## 2011-04-08 DIAGNOSIS — S7000XA Contusion of unspecified hip, initial encounter: Secondary | ICD-10-CM | POA: Insufficient documentation

## 2011-04-08 DIAGNOSIS — S301XXA Contusion of abdominal wall, initial encounter: Secondary | ICD-10-CM | POA: Insufficient documentation

## 2011-04-08 DIAGNOSIS — S8000XA Contusion of unspecified knee, initial encounter: Secondary | ICD-10-CM | POA: Insufficient documentation

## 2011-04-08 DIAGNOSIS — M25569 Pain in unspecified knee: Secondary | ICD-10-CM | POA: Insufficient documentation

## 2011-04-08 DIAGNOSIS — W19XXXA Unspecified fall, initial encounter: Secondary | ICD-10-CM | POA: Insufficient documentation

## 2011-04-08 LAB — COMPREHENSIVE METABOLIC PANEL
Alkaline Phosphatase: 75 U/L (ref 39–117)
BUN: 4 mg/dL — ABNORMAL LOW (ref 6–23)
GFR calc Af Amer: >90 mL/min (ref >90)
Glucose, Bld: 57 mg/dL — ABNORMAL LOW (ref 70–99)
Potassium: 2.8 mEq/L — ABNORMAL LOW (ref 3.5–5.1)
Total Protein: 6.4 g/dL (ref 6.0–8.3)

## 2011-04-08 LAB — DIFFERENTIAL
Basophils Relative: 0 % (ref 0–1)
Eosinophils Absolute: 0 10*3/uL (ref 0.0–0.7)
Lymphocytes Relative: 34 % (ref 12–46)
Lymphs Abs: 1.3 10*3/uL (ref 0.7–4.0)
Monocytes Absolute: 0.3 10*3/uL (ref 0.1–1.0)
Neutro Abs: 2.1 10*3/uL (ref 1.7–7.7)

## 2011-04-08 LAB — CBC
HCT: 34.2 % — ABNORMAL LOW (ref 39.0–52.0)
Hemoglobin: 12 g/dL — ABNORMAL LOW (ref 13.0–17.0)
MCH: 34.5 pg — ABNORMAL HIGH (ref 26.0–34.0)
MCHC: 35.1 g/dL (ref 30.0–36.0)
MCV: 98.3 fL (ref 78.0–100.0)

## 2011-04-08 LAB — PROTIME-INR: Prothrombin Time: 12.3 seconds (ref 11.6–15.2)

## 2011-04-08 MED ORDER — POTASSIUM CHLORIDE 20 MEQ/15ML (10%) PO LIQD
60.0000 meq | Freq: Once | ORAL | Status: AC
  Administered 2011-04-08: 60 meq via ORAL
  Filled 2011-04-08: qty 45

## 2011-04-08 MED ORDER — IOHEXOL 300 MG/ML  SOLN
100.0000 mL | Freq: Once | INTRAMUSCULAR | Status: AC | PRN
  Administered 2011-04-08: 100 mL via INTRAVENOUS

## 2011-04-08 MED ORDER — IOHEXOL 300 MG/ML  SOLN: 100.0000 mL | Freq: Once | INTRAMUSCULAR | Status: AC | PRN

## 2011-04-08 MED ORDER — TETANUS-DIPHTH-ACELL PERTUSSIS 5-2.5-18.5 LF-MCG/0.5 IM SUSP
0.5000 mL | Freq: Once | INTRAMUSCULAR | Status: AC
  Administered 2011-04-08: 0.5 mL via INTRAMUSCULAR
  Filled 2011-04-08: qty 0.5

## 2011-04-08 NOTE — ED Notes (Signed)
Pt ambulated 30 ft in the hall without difficulty. Pt used the side railing for support

## 2011-04-08 NOTE — ED Notes (Signed)
Fall at home, ETOH on board. Large hematoma to RUQ. Slurred speech, alert, oriented to person and place.

## 2011-04-08 NOTE — ED Notes (Signed)
Pt taken to radiology

## 2011-04-08 NOTE — ED Notes (Signed)
QIO:[id number]<BM> Expected date:04/08/11<BR> Expected time: 4:51 PM<BR> Means of arrival:Ambulance<BR> Comments:<BR> EMS 80 GC - fall/etoh
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
## 2011-04-09 NOTE — ED Notes (Signed)
Attempted to call mike snow the nephew. Unable to reach mr. Snow.

## 2011-04-09 NOTE — ED Notes (Signed)
Attempted to call nephew Cindi Carbon. Unable to contact nephew. Will continue to monitor pt.

## 2011-04-09 NOTE — ED Provider Notes (Signed)
Pt ambulated prior to DC. Protecting airway and in NAD. Is being discharged with family that will assume responsibility.  Raeford Razor, MD 04/09/11 [phone]

## 2011-04-09 NOTE — ED Notes (Signed)
[facility] Pick[facility] RN attempted x2 to call family for transportation home, left a message on answering machine 30 mins ago

## 2011-04-09 NOTE — ED Notes (Signed)
Called [name] the nephew. Unable to contact nephew. Left a message with name and number.

## 2011-04-09 NOTE — ED Notes (Signed)
Attempted to contact nephew Cindi Carbon. Unable to contact nephew. Left message with name and phone number. Will continue to monitor pt

## 2011-04-09 NOTE — ED Notes (Signed)
Pt given discharge instructions, verbalized understanding and pt going home with family member.

## 2011-04-14 ENCOUNTER — Emergency Department (HOSPITAL_COMMUNITY): Payer: Medicare Other

## 2011-04-14 ENCOUNTER — Other Ambulatory Visit: Payer: Self-pay

## 2011-04-14 ENCOUNTER — Emergency Department (HOSPITAL_COMMUNITY)
Admission: EM | Admit: 2011-04-14 | Discharge: 2011-04-14 | Disposition: A | Discharge status: Home / Self Care | Payer: Medicare Other | Attending: Emergency Medicine | Admitting: Emergency Medicine

## 2011-04-14 ENCOUNTER — Encounter (HOSPITAL_COMMUNITY): Payer: Self-pay

## 2011-04-14 DIAGNOSIS — S0100XA Unspecified open wound of scalp, initial encounter: Secondary | ICD-10-CM | POA: Insufficient documentation

## 2011-04-14 DIAGNOSIS — R609 Edema, unspecified: Secondary | ICD-10-CM | POA: Insufficient documentation

## 2011-04-14 DIAGNOSIS — M47817 Spondylosis without myelopathy or radiculopathy, lumbosacral region: Secondary | ICD-10-CM | POA: Insufficient documentation

## 2011-04-14 DIAGNOSIS — R071 Chest pain on breathing: Secondary | ICD-10-CM | POA: Insufficient documentation

## 2011-04-14 DIAGNOSIS — F10929 Alcohol use, unspecified with intoxication, unspecified: Secondary | ICD-10-CM

## 2011-04-14 DIAGNOSIS — Z7982 Long term (current) use of aspirin: Secondary | ICD-10-CM | POA: Insufficient documentation

## 2011-04-14 DIAGNOSIS — Z79899 Other long term (current) drug therapy: Secondary | ICD-10-CM | POA: Insufficient documentation

## 2011-04-14 DIAGNOSIS — F1010 Alcohol abuse, uncomplicated: Secondary | ICD-10-CM | POA: Insufficient documentation

## 2011-04-14 DIAGNOSIS — IMO0002 Reserved for concepts with insufficient information to code with codable children: Secondary | ICD-10-CM | POA: Insufficient documentation

## 2011-04-14 DIAGNOSIS — Y92009 Unspecified place in unspecified non-institutional (private) residence as the place of occurrence of the external cause: Secondary | ICD-10-CM | POA: Insufficient documentation

## 2011-04-14 DIAGNOSIS — I4891 Unspecified atrial fibrillation: Secondary | ICD-10-CM | POA: Insufficient documentation

## 2011-04-14 DIAGNOSIS — I252 Old myocardial infarction: Secondary | ICD-10-CM | POA: Insufficient documentation

## 2011-04-14 DIAGNOSIS — M546 Pain in thoracic spine: Secondary | ICD-10-CM | POA: Insufficient documentation

## 2011-04-14 DIAGNOSIS — R0789 Other chest pain: Secondary | ICD-10-CM

## 2011-04-14 DIAGNOSIS — Z8546 Personal history of malignant neoplasm of prostate: Secondary | ICD-10-CM | POA: Insufficient documentation

## 2011-04-14 DIAGNOSIS — R45851 Suicidal ideations: Secondary | ICD-10-CM

## 2011-04-14 DIAGNOSIS — W19XXXA Unspecified fall, initial encounter: Secondary | ICD-10-CM

## 2011-04-14 DIAGNOSIS — M47814 Spondylosis without myelopathy or radiculopathy, thoracic region: Secondary | ICD-10-CM | POA: Insufficient documentation

## 2011-04-14 DIAGNOSIS — W138XXA Fall from, out of or through other building or structure, initial encounter: Secondary | ICD-10-CM | POA: Insufficient documentation

## 2011-04-14 DIAGNOSIS — S0101XA Laceration without foreign body of scalp, initial encounter: Secondary | ICD-10-CM

## 2011-04-14 LAB — CBC
HCT: 34.2 % — ABNORMAL LOW (ref 39.0–52.0)
MCH: 34.1 pg — ABNORMAL HIGH (ref 26.0–34.0)
MCV: 99.7 fL (ref 78.0–100.0)
Platelets: 117 10*3/uL — ABNORMAL LOW (ref 150–400)
RDW: 12.7 % (ref 11.5–15.5)

## 2011-04-14 LAB — COMPREHENSIVE METABOLIC PANEL
ALT: 51 U/L (ref 0–53)
CO2: 25 mEq/L (ref 19–32)
Calcium: 8.4 mg/dL (ref 8.4–10.5)
Creatinine, Ser: 0.72 mg/dL (ref 0.50–1.35)
GFR calc Af Amer: >90 mL/min (ref >90)
GFR calc non Af Amer: >90 mL/min (ref >90)
Glucose, Bld: 78 mg/dL (ref 70–99)
Sodium: 141 mEq/L (ref 135–145)
Total Protein: 6.3 g/dL (ref 6.0–8.3)

## 2011-04-14 LAB — RAPID URINE DRUG SCREEN, HOSP PERFORMED
Amphetamines: NOT DETECTED
Opiates: NOT DETECTED

## 2011-04-14 LAB — CARDIAC PANEL(CRET KIN+CKTOT+MB+TROPI)
CK, MB: 1.7 ng/mL (ref 0.3–4.0)
Relative Index: INVALID (ref 0.0–2.5)
Total CK: 30 U/L (ref 7–232)
Troponin I: <0.3 ng/mL (ref <0.30)

## 2011-04-14 LAB — DIFFERENTIAL
Basophils Absolute: 0 10*3/uL (ref 0.0–0.1)
Eosinophils Absolute: 0 10*3/uL (ref 0.0–0.7)
Eosinophils Relative: 1 % (ref 0–5)
Monocytes Absolute: 0.3 10*3/uL (ref 0.1–1.0)

## 2011-04-14 LAB — ACETAMINOPHEN LEVEL: Acetaminophen (Tylenol), Serum: <15 ug/mL (ref 10–30)

## 2011-04-14 LAB — SALICYLATE LEVEL: Salicylate Lvl: <2 mg/dL — ABNORMAL LOW (ref 2.8–20.0)

## 2011-04-14 LAB — PRO B NATRIURETIC PEPTIDE: Pro B Natriuretic peptide (BNP): 528.2 pg/mL — ABNORMAL HIGH (ref 0–125)

## 2011-04-14 MED ORDER — POTASSIUM CHLORIDE 20 MEQ/15ML (10%) PO LIQD
20.0000 meq | Freq: Once | ORAL | Status: AC
  Administered 2011-04-14: 20 meq via ORAL
  Filled 2011-04-14: qty 15

## 2011-04-14 NOTE — ED Notes (Signed)
Spoke with ACT team, they will be at bedside in 30 min.  Updated family and patient.

## 2011-04-14 NOTE — ED Notes (Signed)
Patient wheeled out, family at bedside.
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
## 2011-04-14 NOTE — ED Notes (Signed)
Patient not in room at this time.

## 2011-04-14 NOTE — ED Notes (Signed)
Sitter at bedside.

## 2011-04-14 NOTE — ED Notes (Signed)
Belongings given to Zollie Pee, patient's daughter.

## 2011-04-14 NOTE — ED Notes (Signed)
Pt fell yesterday and refused to come and then fell again today.  Pt has etoh on board.  No LOC.  Pt is awake.  Lac to left lateral side of head above left ear.

## 2011-04-14 NOTE — ED Notes (Signed)
Sitter at bedside, waiting for ACT team.
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
## 2011-04-14 NOTE — ED Notes (Signed)
Pt refused to come and told ems that he did not want to dissappoint his family by wanting to die and wants to go ahead and get it over with and doesnot want to live anymore.  He asked ems to start an IV and give him an air bolus and kill him and not tell anyone about it

## 2011-04-14 NOTE — ED Notes (Signed)
Sitter at bedside, patient resting, lunch tray at bedside, no distress noted.

## 2011-04-14 NOTE — ED Notes (Signed)
[name] with house coverage aware of patient

## 2011-04-15 ENCOUNTER — Emergency Department (HOSPITAL_COMMUNITY)
Admission: EM | Admit: 2011-04-15 | Discharge: 2011-04-15 | Disposition: A | Discharge status: Home / Self Care | Payer: Medicare Other | Attending: Emergency Medicine | Admitting: Emergency Medicine

## 2011-04-15 ENCOUNTER — Encounter (HOSPITAL_COMMUNITY): Payer: Self-pay | Admitting: Emergency Medicine

## 2011-04-15 DIAGNOSIS — I252 Old myocardial infarction: Secondary | ICD-10-CM | POA: Insufficient documentation

## 2011-04-15 DIAGNOSIS — I2510 Atherosclerotic heart disease of native coronary artery without angina pectoris: Secondary | ICD-10-CM | POA: Insufficient documentation

## 2011-04-15 DIAGNOSIS — I4891 Unspecified atrial fibrillation: Secondary | ICD-10-CM | POA: Insufficient documentation

## 2011-04-15 DIAGNOSIS — F1010 Alcohol abuse, uncomplicated: Secondary | ICD-10-CM

## 2011-04-15 HISTORY — DX: Atherosclerotic heart disease of native coronary artery without angina pectoris: I25.10

## 2011-04-15 LAB — CBC
HCT: 37.3 % — ABNORMAL LOW (ref 39.0–52.0)
Hemoglobin: 13 g/dL (ref 13.0–17.0)
MCH: 34.9 pg — ABNORMAL HIGH (ref 26.0–34.0)
MCHC: 34.9 g/dL (ref 30.0–36.0)
RBC: 3.73 MIL/uL — ABNORMAL LOW (ref 4.22–5.81)

## 2011-04-15 LAB — DIFFERENTIAL
Basophils Relative: 1 % (ref 0–1)
Eosinophils Absolute: 0.1 10*3/uL (ref 0.0–0.7)
Eosinophils Relative: 2 % (ref 0–5)
Lymphocytes Relative: 49 % — ABNORMAL HIGH (ref 12–46)
Neutro Abs: 1.4 10*3/uL — ABNORMAL LOW (ref 1.7–7.7)

## 2011-04-15 LAB — BASIC METABOLIC PANEL
BUN: 6 mg/dL (ref 6–23)
CO2: 21 mEq/L (ref 19–32)
GFR calc non Af Amer: >90 mL/min (ref >90)
Glucose, Bld: 73 mg/dL (ref 70–99)
Potassium: 4.3 mEq/L (ref 3.5–5.1)
Sodium: 137 mEq/L (ref 135–145)

## 2011-04-15 LAB — ETHANOL: Alcohol, Ethyl (B): 391 mg/dL — ABNORMAL HIGH (ref 0–11)

## 2011-04-15 MED ORDER — IPRATROPIUM BROMIDE 0.02 % IN SOLN
RESPIRATORY_TRACT | Status: AC
  Filled 2011-04-15: qty 2.5

## 2011-04-15 MED ORDER — ALBUTEROL (5 MG/ML) CONTINUOUS INHALATION SOLN
INHALATION_SOLUTION | RESPIRATORY_TRACT | Status: AC
  Filled 2011-04-15: qty 20
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
## 2011-04-15 NOTE — ED Notes (Signed)
House Coverage made aware of pt and need for a sitter.

## 2011-04-15 NOTE — ED Notes (Signed)
PT. REQUESTING DETOX FOR ALCOHOL ABUSE , LAST ETOH INTAKE TODAY .

## 2011-04-15 NOTE — ED Notes (Signed)
Pt ambulated to wheelchair and was transported to vehicle.  NAD, no verbal complaints at this time. Denies pain/discomfort.

## 2011-04-16 NOTE — ED Notes (Signed)
The pt is sitting in a chair in the room with a sitter.  Aside from his inappropriate comments, he was, for the most part, pleasant and cooperative during his visit.

## 2011-04-16 NOTE — ED Notes (Signed)
Pt status was set to discharge.  Provided him with his personal belongings and explained his discharge to him and his family who arrived to transport hin home.
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
## 2011-04-24 ENCOUNTER — Ambulatory Visit (INDEPENDENT_AMBULATORY_CARE_PROVIDER_SITE_OTHER): Payer: Medicare Other | Admitting: *Deleted

## 2011-04-24 DIAGNOSIS — Z4802 Encounter for removal of sutures: Secondary | ICD-10-CM
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
## 2011-05-14 ENCOUNTER — Ambulatory Visit (INDEPENDENT_AMBULATORY_CARE_PROVIDER_SITE_OTHER): Payer: Medicare Other | Admitting: Family Medicine

## 2011-05-14 DIAGNOSIS — M545 Low back pain: Secondary | ICD-10-CM

## 2011-05-14 DIAGNOSIS — F1010 Alcohol abuse, uncomplicated: Secondary | ICD-10-CM

## 2011-05-14 DIAGNOSIS — K219 Gastro-esophageal reflux disease without esophagitis: Secondary | ICD-10-CM | POA: Insufficient documentation

## 2011-05-14 MED ORDER — TRAMADOL HCL 50 MG PO TABS: 50.0000 mg | ORAL_TABLET | Freq: Three times a day (TID) | ORAL | Status: DC | PRN

## 2011-05-14 MED ORDER — OMEPRAZOLE 20 MG PO CPDR: 20.0000 mg | DELAYED_RELEASE_CAPSULE | Freq: Every day | ORAL | Status: DC

## 2011-05-14 NOTE — Progress Notes (Signed)
[name] is a 76 y.o. male who presents to [facility] today for low back pain and reflux. [name] has not been seen by a physician at [facility] in over one year.  He is here to reestablish care and to refill his back pain medication.  In the interim he feels well. He notes some chronic low back pain and occasional reflux.  1) back pain: Chronic bilateral lumbar, not associated with weakness numbness radiculopathy bowel or bladder dysfunction. Previously well controlled with tramadol.    2) reflux: Previously well controlled with omeprazole. Currently not taking anything. No significant abdominal pain fevers chills vomiting or diarrhea.  3) alcohol use: Drinks 4-5 beers a day. Is not interested in quitting or changing at this time.    PMH reviewed. Significant for alcohol abuse ROS as above otherwise neg Medications reviewed. Current Outpatient Prescriptions  Medication Sig Dispense Refill  . aspirin EC 81 MG tablet Take 81 mg by mouth daily.      Marland Kitchen lisinopril (PRINIVIL,ZESTRIL) 5 MG tablet Take 5 mg by mouth daily.      . metoprolol (LOPRESSOR) 50 MG tablet Take 75 mg by mouth 2 (two) times daily.      . Multiple Vitamin (MULITIVITAMIN WITH MINERALS) TABS Take 1 tablet by mouth daily.       . Omega-3 Fatty Acids (FISH OIL) 1200 MG CAPS Take 1 capsule by mouth daily.       . simvastatin (ZOCOR) 20 MG tablet Take 20 mg by mouth at bedtime.       . Tamsulosin HCl (FLOMAX) 0.4 MG CAPS Take 0.4 mg by mouth daily.      . nitroGLYCERIN (NITROSTAT) 0.4 MG SL tablet Place 0.4 mg under the tongue every 5 (five) minutes x 3 doses as needed. For chest pain.      Marland Kitchen omeprazole (PRILOSEC) 20 MG capsule Take 1 capsule (20 mg total) by mouth daily.  30 capsule  3  . traMADol (ULTRAM) 50 MG tablet Take 1 tablet (50 mg total) by mouth every 8 (eight) hours as needed for pain.  90 tablet  0    Exam:  BP 102/64  Pulse 68  Temp(Src) 97.9 F (36.6 C) (Oral)  Ht 5\' 11"  (1.803 m)  Wt 180 lb  (81.647 kg)  BMI 25.10 kg/m2 Gen: Well NAD, thin elderly appearing HEENT: EOMI,  MMM Lungs: CTABL Nl WOB Heart: RRR no MRG Abd: NABS, NT, ND, no hepatosplenomegaly Exts: Non edematous BL  LE, warm and well perfused.  MSK: Nontender over spinal midline. No significant tenderness over bilateral paraspinal areas or SI joints. Reflexes diminished but equal bilaterally.  Strength preserved sensation intact.  Mild broad-based gait

## 2011-05-14 NOTE — Patient Instructions (Signed)
Thank you for coming in today. I have provided you with enough Tramadol for 1 month.  Please follow up with Dr. Sheffield Slider or the Geriatrics clinic in less than 1 month.  I also refilled you prilosec.  Please consider your alcohol use. Let Dr. Sheffield Slider know when you are ready to quit.  If you have worsening back pain, worsening weakness or numbness or fecal or urine incontinence please let us know.

## 2011-05-14 NOTE — Assessment & Plan Note (Signed)
Current alcohol abuse with 4-5 beers a day. Not interested in quitting at this time.  Advised to consider quitting in the future.

## 2011-05-14 NOTE — Assessment & Plan Note (Signed)
Plan to refill omeprazole today. Followup with primary care doctor in one month or less

## 2011-05-14 NOTE — Assessment & Plan Note (Signed)
Likely do to degenerative disc disease. Previously well controlled with tramadol. We'll start low-dose tramadol 3 times a day with enough pills for followup with Dr. Sheffield Slider in one month. Discussed warning signs such as worsening weakness numbness bowel or bladder dysfunction with patient who expresses understanding.

## 2011-05-18 ENCOUNTER — Emergency Department (HOSPITAL_COMMUNITY)
Admission: EM | Admit: 2011-05-18 | Discharge: 2011-05-19 | Disposition: A | Discharge status: Home / Self Care | Payer: Medicare Other | Attending: Emergency Medicine | Admitting: Emergency Medicine

## 2011-05-18 ENCOUNTER — Encounter (HOSPITAL_COMMUNITY): Payer: Self-pay | Admitting: Emergency Medicine

## 2011-05-18 DIAGNOSIS — S43401A Unspecified sprain of right shoulder joint, initial encounter: Secondary | ICD-10-CM

## 2011-05-18 DIAGNOSIS — I2510 Atherosclerotic heart disease of native coronary artery without angina pectoris: Secondary | ICD-10-CM | POA: Insufficient documentation

## 2011-05-18 DIAGNOSIS — IMO0002 Reserved for concepts with insufficient information to code with codable children: Secondary | ICD-10-CM | POA: Insufficient documentation

## 2011-05-18 DIAGNOSIS — M25519 Pain in unspecified shoulder: Secondary | ICD-10-CM | POA: Insufficient documentation

## 2011-05-18 DIAGNOSIS — W07XXXA Fall from chair, initial encounter: Secondary | ICD-10-CM | POA: Insufficient documentation

## 2011-05-18 DIAGNOSIS — I252 Old myocardial infarction: Secondary | ICD-10-CM | POA: Insufficient documentation

## 2011-05-18 NOTE — ED Notes (Signed)
Called out fall, ETOH patient fell asleep in the recliner and woke up in the floor causing pain in right shoulder. Deformity, swelling and bruising noted, denies neck and back pain, #20 LFA

## 2011-05-19 ENCOUNTER — Emergency Department (HOSPITAL_COMMUNITY): Payer: Medicare Other

## 2011-05-19 MED ORDER — FENTANYL CITRATE 0.05 MG/ML IJ SOLN
50.0000 ug | Freq: Once | INTRAMUSCULAR | Status: AC
  Administered 2011-05-19: 50 ug via INTRAVENOUS
  Filled 2011-05-19: qty 2

## 2011-05-19 MED ORDER — HYDROCODONE-ACETAMINOPHEN 5-325 MG PO TABS: 1.0000 | ORAL_TABLET | Freq: Four times a day (QID) | ORAL | Status: AC | PRN

## 2011-05-19 MED ORDER — ONDANSETRON HCL 4 MG/2ML IJ SOLN
4.0000 mg | Freq: Once | INTRAMUSCULAR | Status: AC
  Administered 2011-05-19: 4 mg via INTRAVENOUS
  Filled 2011-05-19: qty 2

## 2011-05-19 MED ORDER — SODIUM CHLORIDE 0.9 % IV SOLN
INTRAVENOUS | Status: DC
  Administered 2011-05-19: via INTRAVENOUS
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
## 2011-06-04 ENCOUNTER — Ambulatory Visit: Payer: Medicare Other | Admitting: Family Medicine

## 2011-06-20 ENCOUNTER — Ambulatory Visit (INDEPENDENT_AMBULATORY_CARE_PROVIDER_SITE_OTHER): Payer: Medicare Other | Admitting: Family Medicine

## 2011-06-20 VITALS — BP 90/65 | HR 68 | Ht 71.0 in | Wt 180.0 lb

## 2011-06-20 DIAGNOSIS — F1010 Alcohol abuse, uncomplicated: Secondary | ICD-10-CM

## 2011-06-20 DIAGNOSIS — E781 Pure hyperglyceridemia: Secondary | ICD-10-CM

## 2011-06-20 DIAGNOSIS — I4891 Unspecified atrial fibrillation: Secondary | ICD-10-CM

## 2011-06-20 DIAGNOSIS — R42 Dizziness and giddiness: Secondary | ICD-10-CM

## 2011-06-20 DIAGNOSIS — K219 Gastro-esophageal reflux disease without esophagitis: Secondary | ICD-10-CM

## 2011-06-20 DIAGNOSIS — M545 Low back pain, unspecified: Secondary | ICD-10-CM

## 2011-06-20 MED ORDER — LISINOPRIL 5 MG PO TABS: 5.0000 mg | ORAL_TABLET | Freq: Every day | ORAL | Status: DC

## 2011-06-20 MED ORDER — SIMVASTATIN 20 MG PO TABS: 20.0000 mg | ORAL_TABLET | Freq: Every day | ORAL | Status: DC

## 2011-06-20 MED ORDER — TAMSULOSIN HCL 0.4 MG PO CAPS: 0.4000 mg | ORAL_CAPSULE | Freq: Every day | ORAL | Status: DC

## 2011-06-20 MED ORDER — TRAMADOL HCL 50 MG PO TABS: 50.0000 mg | ORAL_TABLET | Freq: Three times a day (TID) | ORAL | Status: DC | PRN

## 2011-06-20 MED ORDER — METOPROLOL TARTRATE 50 MG PO TABS: 50.0000 mg | ORAL_TABLET | Freq: Two times a day (BID) | ORAL | Status: DC

## 2011-06-20 MED ORDER — OMEPRAZOLE 20 MG PO CPDR
20.0000 mg | DELAYED_RELEASE_CAPSULE | Freq: Every day | ORAL | Status: DC
Start: 2011-06-20 — End: 2011-09-19

## 2011-06-20 NOTE — Patient Instructions (Addendum)
It was great to see you today!  Schedule an appointment to see your PCP as needed.  We have decreased your metoprolol from 1.5 twice a day to just one tab twice a day.   We have checked your liver function and your cholesterol today.

## 2011-06-20 NOTE — Assessment & Plan Note (Signed)
Better since starting the omeprazole. Will continue this regimen.

## 2011-06-20 NOTE — Assessment & Plan Note (Signed)
Asymptomatic at this time with a pulse of 68. He does have dizziness related to orthostatic changes. We are decreasing his metoprolol to 50 BID from 75 BID

## 2011-06-20 NOTE — Assessment & Plan Note (Signed)
We decreased his Metoprolol to 50 BID from 75 BID. He was not orthostatic greater than 20 mmhg, however he did drop 20. 110 - 90 systolic 78-65 diastolic

## 2011-06-20 NOTE — Progress Notes (Signed)
  Subjective:    Patient ID: [name], male    DOB: [birth year], 76 y.o.   MRN: [id number]  HPI 1. Lightheadedness Patient states that he gets "swimmy" when he stands up and this is something that comes and goes. He attributes it to his medications this morning. He takes 75 mg of metoprolol BID and lisinopril 5mg . No falls.  He also has atrial fibrillation, but denies SOB or fast heart rate. He does notice the irregular beat.  No blood clots or CVA.  2. Alcoholism Patient has 50+ year history of alcohol abuse. He drinks 3-4 beers per night. He says he has stopped whiskey. His last binge was one month ago. Last pancreatitis was 3-4 years ago. He denies wanting to quit. He understands the risks of continued imbibment.   3. Mini-Mental status exam - rule out dementia 27/30 - did well.   Review of Systems Pertinent items are noted in HPI. No fever, chills, night sweats, weight loss. No bleeding. No stool abnormalities.  No urinary issues, no frequency/dysuria/nocturia No sleep abnormalities No weight gain.  No falls.     Objective:   Physical Exam Filed Vitals:   06/20/11 0958 06/20/11 1108 06/20/11 1109  BP: 110/73 110/78 90/65  Pulse: 70 68   Height: 5\' 11"  (1.803 m)    Weight: 180 lb (81.647 kg)    Lungs:  Normal respiratory effort, chest expands symmetrically. Lungs are clear to auscultation, no crackles or wheezes. Heart - difficult ausculation: patient has irregularly Regular rate and rhythm.  No murmurs, gallops or rubs.    Abdomen: soft and non-tender without masses, organomegaly or hernias noted.  No guarding or rebound. No splenomegaly/hepatomegaly. No spider angiomas. No ascites(no seccussion splash) Extremities:  No cyanosis, edema, or deformity noted Foot Check -  Appearance - no lesions, ulcers or calluses Skin - no unusual pallor or redness Sensation - grossly intact to light touch Pulses: Left - Dorsalis Pedis and Posterior Tibia normal Right - Dorsalis  Pedis and Posterior Tibia normal  Mini-Mobility Test: - sit to stand - normal - immediate standing balance (some light headedness) normal - continued standing balance - normal - romberg - mildly unsteady -Tandem stance - moderately unsteady -Back Lean - limited ( low back pain) - neck rotation - fair range of motion without vertigo -walking stance - broad (greater than 4 inches) - step symmetry - normal - step continuity - normal - path deviation - normal - trunk movement - normal - turning - nearly continuous - walk fast - somewhat altered (uses cane) - sit down - sits under control with arms across chest     Assessment & Plan:

## 2011-06-20 NOTE — Assessment & Plan Note (Signed)
Drinks [place] 3-4 beers per night. Has not drank heavily for one month.  Last pancreatitis: 3-4 years ago. - pt. Thinks it was minor

## 2011-06-21 ENCOUNTER — Encounter: Payer: Self-pay | Admitting: Family Medicine

## 2011-06-21 LAB — COMPREHENSIVE METABOLIC PANEL
Albumin: 3.9 g/dL (ref 3.5–5.2)
Alkaline Phosphatase: 71 U/L (ref 39–117)
BUN: 14 mg/dL (ref 6–23)
CO2: 26 mEq/L (ref 19–32)
Calcium: 9 mg/dL (ref 8.4–10.5)
Chloride: 93 mEq/L — ABNORMAL LOW (ref 96–112)
Glucose, Bld: 114 mg/dL — ABNORMAL HIGH (ref 70–99)
Potassium: 4.3 mEq/L (ref 3.5–5.3)
Sodium: 130 mEq/L — ABNORMAL LOW (ref 135–145)
Total Protein: 6.3 g/dL (ref 6.0–8.3)

## 2011-06-21 LAB — LIPID PANEL
Cholesterol: 153 mg/dL (ref 0–200)
HDL: 72 mg/dL (ref >39)
Triglycerides: 109 mg/dL (ref <150)

## 2011-08-08 ENCOUNTER — Telehealth: Payer: Self-pay | Admitting: *Deleted

## 2011-08-08 ENCOUNTER — Ambulatory Visit (INDEPENDENT_AMBULATORY_CARE_PROVIDER_SITE_OTHER): Payer: Medicare Other | Admitting: Family Medicine

## 2011-08-08 ENCOUNTER — Encounter: Payer: Self-pay | Admitting: Family Medicine

## 2011-08-08 VITALS — BP 115/77 | HR 72 | Ht 71.0 in | Wt 183.0 lb

## 2011-08-08 DIAGNOSIS — R42 Dizziness and giddiness: Secondary | ICD-10-CM

## 2011-08-08 DIAGNOSIS — I10 Essential (primary) hypertension: Secondary | ICD-10-CM

## 2011-08-08 DIAGNOSIS — R600 Localized edema: Secondary | ICD-10-CM | POA: Insufficient documentation

## 2011-08-08 DIAGNOSIS — R609 Edema, unspecified: Secondary | ICD-10-CM

## 2011-08-08 LAB — COMPLETE METABOLIC PANEL WITH GFR
ALT: 24 U/L (ref 0–53)
CO2: 28 mEq/L (ref 19–32)
Creat: 0.77 mg/dL (ref 0.50–1.35)
GFR, Est African American: >89 mL/min
Total Bilirubin: 1.5 mg/dL — ABNORMAL HIGH (ref 0.3–1.2)

## 2011-08-08 NOTE — Patient Instructions (Signed)
Thank you for coming in today. Please get the ultrasound of your leg.  We will also do some blood tests today.  Please follow up with Dr. Sheffield Slider in about a month or sooner if worse.  Call or go to the emergency room if you get worse, have trouble breathing, have chest pains, or palpitations.

## 2011-08-08 NOTE — Assessment & Plan Note (Signed)
Improved after reducing metoprolol from 75 mg to 50 mg twice daily.  Plan continue blood pressure medications and followup at the next visit

## 2011-08-08 NOTE — Telephone Encounter (Signed)
Called and informed patient of appointment for venous dopplers at [facility] on Monday Aug 12, 2011 at 9 am.Azalia Neuberger, Rodena Medin

## 2011-08-08 NOTE — Progress Notes (Signed)
[name] is a 76 y.o. male who presents to [facility] today for   1) right leg swelling: Patient is one week of right lower strip the swelling of the foot and ankle. He notes that it is not painful or red. He denies any injury to this foot. He also denies any respiratory complaints such as cough or shortness of breath. Additionally he denies any chest pain palpitations or syncope.  2) right ingrown toenail: Patient is one week of ingrown toenail on the medial nail fold of the right great toe.  It is mildly painful it started about a week ago.    3) hypertension: Currently taking medications listed below. Metoprolol was decreased from 75 mg twice a day to 50 mg twice a day one month ago and reaction to mild dizziness when standing.  The dizziness is much improved and he feels better.     PMH: Reviewed significant for hypertension, hyperlipidemia, alcoholism History  Substance Use Topics  . Smoking status: Former Smoker -- 20 years    Types: Cigarettes    Quit date: 04/01/1988  . Smokeless tobacco: Not on file  . Alcohol Use: 16.8 oz/week    28 Cans of beer per week     Recurrent alcoholism   ROS as above  Medications reviewed. Current Outpatient Prescriptions  Medication Sig Dispense Refill  . aspirin EC 81 MG tablet Take 81 mg by mouth daily.      Marland Kitchen lisinopril (PRINIVIL,ZESTRIL) 5 MG tablet Take 1 tablet (5 mg total) by mouth daily.  30 tablet  11  . metoprolol (LOPRESSOR) 50 MG tablet Take 1 tablet (50 mg total) by mouth 2 (two) times daily.  60 tablet  11  . Multiple Vitamin (MULITIVITAMIN WITH MINERALS) TABS Take 1 tablet by mouth daily.       . Omega-3 Fatty Acids (FISH OIL) 1200 MG CAPS Take 1 capsule by mouth daily.       Marland Kitchen omeprazole (PRILOSEC) 20 MG capsule Take 1 capsule (20 mg total) by mouth daily.  30 capsule  11  . simvastatin (ZOCOR) 20 MG tablet Take 1 tablet (20 mg total) by mouth at bedtime.  30 tablet  11  . Tamsulosin HCl (FLOMAX) 0.4 MG CAPS Take 1 capsule (0.4 mg  total) by mouth daily.  30 capsule  11  . traMADol (ULTRAM) 50 MG tablet Take 1 tablet (50 mg total) by mouth every 8 (eight) hours as needed for pain.  90 tablet  5  . nitroGLYCERIN (NITROSTAT) 0.4 MG SL tablet Place 0.4 mg under the tongue every 5 (five) minutes x 3 doses as needed. For chest pain.        Exam:  BP 115/77  Pulse 72  Ht 5\' 11"  (1.803 m)  Wt 183 lb (83.008 kg)  BMI 25.52 kg/m2 Gen: Well NAD HEENT: EOMI,  MMM Lungs: CTABL Nl WOB Heart: RRR no MRG Abd: NABS, NT, ND Exts: 1+ edema on the right lower extremity to the mid shin. Left lower extremity trace edema. Calf diameter is 36 cm on the right and 35 cm on the left at 10 cm distal to the tibial tuberosity. Toe: Right great toe has mild medial fold ingrown nail.  No erythema. Mildly tender.    No results found for this or any previous visit (from the past 72 hour(s)).

## 2011-08-08 NOTE — Assessment & Plan Note (Signed)
Currently doing well after reducing metoprolol. No plans to change at this time

## 2011-08-08 NOTE — Assessment & Plan Note (Signed)
Mild right worse than left edema of the lower extremities.  I believe this is related more to fluid retention than true unilateral cause of swelling however I do feel that DVT needs to be evaluated.  Plan to obtain a duplex ultrasound of the right lower extremity along with a comprehensive of metabolic panel and brain natruretic peptide to assess for localized and systemic causes for fluid retention. Plan to followup this issue in a few weeks or sooner if needed

## 2011-08-12 ENCOUNTER — Encounter: Payer: Self-pay | Admitting: Family Medicine

## 2011-08-12 ENCOUNTER — Telehealth: Payer: Self-pay | Admitting: Family Medicine

## 2011-08-12 NOTE — Telephone Encounter (Signed)
I called him to follow up on his leg swelling and he says that has resolved. He overslept this AM and missed his venous dopplers appointment. I informed him that his labs show some irritation of his liver from the alcohol. His BNP was mildly elevated and I suspect excess salt intake was contributing to the edema. He will return to see me in a month, sooner if the edema recurs.

## 2011-09-05 ENCOUNTER — Emergency Department (HOSPITAL_COMMUNITY)
Admission: EM | Admit: 2011-09-05 | Discharge: 2011-09-05 | Disposition: A | Discharge status: Home / Self Care | Payer: Medicare Other | Attending: Emergency Medicine | Admitting: Emergency Medicine

## 2011-09-05 ENCOUNTER — Encounter (HOSPITAL_COMMUNITY): Payer: Self-pay | Admitting: *Deleted

## 2011-09-05 ENCOUNTER — Emergency Department (HOSPITAL_COMMUNITY): Payer: Medicare Other

## 2011-09-05 DIAGNOSIS — R5383 Other fatigue: Secondary | ICD-10-CM | POA: Insufficient documentation

## 2011-09-05 DIAGNOSIS — Z8546 Personal history of malignant neoplasm of prostate: Secondary | ICD-10-CM | POA: Insufficient documentation

## 2011-09-05 DIAGNOSIS — I2510 Atherosclerotic heart disease of native coronary artery without angina pectoris: Secondary | ICD-10-CM | POA: Insufficient documentation

## 2011-09-05 DIAGNOSIS — G319 Degenerative disease of nervous system, unspecified: Secondary | ICD-10-CM | POA: Insufficient documentation

## 2011-09-05 DIAGNOSIS — R5381 Other malaise: Secondary | ICD-10-CM | POA: Insufficient documentation

## 2011-09-05 DIAGNOSIS — F1010 Alcohol abuse, uncomplicated: Secondary | ICD-10-CM | POA: Diagnosis present

## 2011-09-05 DIAGNOSIS — Z79899 Other long term (current) drug therapy: Secondary | ICD-10-CM | POA: Insufficient documentation

## 2011-09-05 DIAGNOSIS — Z87891 Personal history of nicotine dependence: Secondary | ICD-10-CM | POA: Insufficient documentation

## 2011-09-05 DIAGNOSIS — IMO0002 Reserved for concepts with insufficient information to code with codable children: Secondary | ICD-10-CM | POA: Diagnosis present

## 2011-09-05 DIAGNOSIS — I252 Old myocardial infarction: Secondary | ICD-10-CM | POA: Insufficient documentation

## 2011-09-05 DIAGNOSIS — Z951 Presence of aortocoronary bypass graft: Secondary | ICD-10-CM | POA: Insufficient documentation

## 2011-09-05 LAB — COMPREHENSIVE METABOLIC PANEL
ALT: 47 U/L (ref 0–53)
AST: 105 U/L — ABNORMAL HIGH (ref 0–37)
Albumin: 3.1 g/dL — ABNORMAL LOW (ref 3.5–5.2)
Alkaline Phosphatase: 74 U/L (ref 39–117)
BUN: 5 mg/dL — ABNORMAL LOW (ref 6–23)
Potassium: 3.1 mEq/L — ABNORMAL LOW (ref 3.5–5.1)
Sodium: 138 mEq/L (ref 135–145)
Total Protein: 6.4 g/dL (ref 6.0–8.3)

## 2011-09-05 LAB — DIFFERENTIAL
Basophils Absolute: 0 10*3/uL (ref 0.0–0.1)
Basophils Relative: 1 % (ref 0–1)
Eosinophils Absolute: 0.1 10*3/uL (ref 0.0–0.7)
Neutro Abs: 1.2 10*3/uL — ABNORMAL LOW (ref 1.7–7.7)
Neutrophils Relative %: 36 % — ABNORMAL LOW (ref 43–77)

## 2011-09-05 LAB — CBC
MCH: 33.4 pg (ref 26.0–34.0)
MCHC: 35 g/dL (ref 30.0–36.0)
Platelets: 163 10*3/uL (ref 150–400)
RDW: 14.1 % (ref 11.5–15.5)

## 2011-09-05 LAB — ETHANOL: Alcohol, Ethyl (B): 373 mg/dL — ABNORMAL HIGH (ref 0–11)

## 2011-09-05 NOTE — ED Notes (Signed)
Pt arrived by [facility], had a fall at home, skin tear to left elbow. +etoh

## 2011-09-05 NOTE — ED Provider Notes (Signed)
I saw and evaluated the patient, reviewed the resident's note and I agree with the findings and plan.   Rhema Boyett, MD 09/05/11 2307 

## 2011-09-05 NOTE — ED Notes (Signed)
PT began having jerking movement of the upper extremities lasted approx. 15 seconds and then was not making sense with speech. Dr. Rainey Pines informed
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
## 2011-09-05 NOTE — Discharge Instructions (Signed)
Follow up with your providers as dicussed in the ED today and as written above.  See your doctor immediately--or return to the ED--with any new or troubling symptoms including fevers, weakness, new chest pain, shortness or breath, numbness, or any other concerning symptom.   Alcohol Intoxication You have alcohol intoxication when the amount of alcohol that you have consumed has impaired your ability to mentally and physically function. There are a variety of factors that contribute to the level at which alcohol intoxication can occur, such as age, gender, weight, frequency of alcohol consumption, medication use, and the presence of other medical conditions, such as diabetes, seizures, or heart conditions. The blood alcohol level test measures the concentration of alcohol in your blood. In most states, your blood alcohol level must be lower than 80 mg/dL (1.61%) to legally drive. However, many dangerous effects of alcohol can occur at much lower levels. Alcohol directly impairs the normal chemical activity of the brain and is said to be a chemical depressant. Alcohol can cause drowsiness, stupor, respiratory failure, and coma. Other physical effects can include headache, vomiting, vomiting of blood, abdominal pain, a fast heartbeat, difficulty breathing, anxiety, and amnesia. Alcohol intoxication can also lead to dangerous and life-threatening activities, such as fighting, dangerous operation of vehicles or heavy machinery, and risky sexual behavior. Alcohol can be especially dangerous when taken with other drugs. Some of these drugs are:  Sedatives.   Painkillers.   Marijuana.   Tranquilizers.   Antihistamines.   Muscle relaxants.   Seizure medicine.  Many of the effects of acute alcohol intoxication are temporary. However, repeated alcohol intoxication can lead to severe medical illnesses. If you have alcohol intoxication, you should:  Stay hydrated. Drink enough water and fluids to keep  your urine clear or pale yellow. Avoid excessive caffeine because this can further lead to dehydration.   Eat a healthy diet. You may have residual nausea, headache, and loss of appetite, but it is still important that you maintain good nutrition. You can start with clear liquids.   Take nonsteroidal anti-inflammatory medications as needed for headaches, but make sure to do so with small meals. You should avoid acetaminophen for several days after having alcohol intoxication because the combination of alcohol and acetaminophen can be toxic to your liver.  If you have frequent alcohol intoxication, ask your friends and family if they think you have a drinking problem. For further help, contact:  Your caregiver.   Alcoholics Anonymous (AA).   A drug or alcohol rehabilitation program.  SEEK MEDICAL CARE IF:   You have persistent vomiting.   You have persistent pain in any part of your body.   You do not feel better after a few days.  SEEK IMMEDIATE MEDICAL CARE IF:   You become shaky or tremble when you try to stop drinking.   You shake uncontrollably (seizure).   You throw up (vomit) blood. This may be bright red or it may look like black coffee grounds.   You have blood in the stool. This may be bright red or appear as a black, tarry, bad smelling stool.   You become lightheaded or faint.  ANY OF THESE SYMPTOMS MAY REPRESENT A SERIOUS PROBLEM THAT IS AN EMERGENCY. Do not wait to see if the symptoms will go away. Get medical help right away. Call your local emergency services (911 in [place] DO NOT drive yourself to the hospital. MAKE SURE YOU:   Understand these instructions.   Will watch your condition.  Will get help right away if you are not doing well or get worse.  Document Released: 12/26/2004 Document Revised: 03/07/2011 Document Reviewed: 09/04/2009 [facility] Patient Information 2012 [place], [place].

## 2011-09-05 NOTE — ED Notes (Signed)
Pt has attempted several times to leave; RN aware; EMT's and Nurse will continue to monitor.

## 2011-09-10 ENCOUNTER — Encounter (HOSPITAL_COMMUNITY): Payer: Self-pay | Admitting: Emergency Medicine

## 2011-09-10 ENCOUNTER — Inpatient Hospital Stay (HOSPITAL_COMMUNITY)
Admission: EM | Admit: 2011-09-10 | Discharge: 2011-09-13 | DRG: 897 | Disposition: A | Discharge status: Home / Self Care | Payer: Medicare Other | Attending: Family Medicine | Admitting: Family Medicine

## 2011-09-10 ENCOUNTER — Emergency Department (HOSPITAL_COMMUNITY): Payer: Medicare Other

## 2011-09-10 DIAGNOSIS — R4182 Altered mental status, unspecified: Secondary | ICD-10-CM

## 2011-09-10 DIAGNOSIS — K5900 Constipation, unspecified: Secondary | ICD-10-CM | POA: Diagnosis present

## 2011-09-10 DIAGNOSIS — I252 Old myocardial infarction: Secondary | ICD-10-CM

## 2011-09-10 DIAGNOSIS — I447 Left bundle-branch block, unspecified: Secondary | ICD-10-CM | POA: Insufficient documentation

## 2011-09-10 DIAGNOSIS — Z79899 Other long term (current) drug therapy: Secondary | ICD-10-CM

## 2011-09-10 DIAGNOSIS — I4891 Unspecified atrial fibrillation: Secondary | ICD-10-CM | POA: Diagnosis present

## 2011-09-10 DIAGNOSIS — F10239 Alcohol dependence with withdrawal, unspecified: Principal | ICD-10-CM

## 2011-09-10 DIAGNOSIS — IMO0002 Reserved for concepts with insufficient information to code with codable children: Secondary | ICD-10-CM

## 2011-09-10 DIAGNOSIS — R531 Weakness: Secondary | ICD-10-CM | POA: Diagnosis present

## 2011-09-10 DIAGNOSIS — G959 Disease of spinal cord, unspecified: Secondary | ICD-10-CM | POA: Diagnosis present

## 2011-09-10 DIAGNOSIS — I509 Heart failure, unspecified: Secondary | ICD-10-CM | POA: Diagnosis present

## 2011-09-10 DIAGNOSIS — E785 Hyperlipidemia, unspecified: Secondary | ICD-10-CM | POA: Diagnosis present

## 2011-09-10 DIAGNOSIS — F3289 Other specified depressive episodes: Secondary | ICD-10-CM | POA: Diagnosis present

## 2011-09-10 DIAGNOSIS — Z951 Presence of aortocoronary bypass graft: Secondary | ICD-10-CM

## 2011-09-10 DIAGNOSIS — F10939 Alcohol use, unspecified with withdrawal, unspecified: Principal | ICD-10-CM | POA: Diagnosis present

## 2011-09-10 DIAGNOSIS — I5032 Chronic diastolic (congestive) heart failure: Secondary | ICD-10-CM | POA: Diagnosis present

## 2011-09-10 DIAGNOSIS — F1010 Alcohol abuse, uncomplicated: Secondary | ICD-10-CM | POA: Diagnosis present

## 2011-09-10 DIAGNOSIS — F329 Major depressive disorder, single episode, unspecified: Secondary | ICD-10-CM | POA: Diagnosis present

## 2011-09-10 DIAGNOSIS — Z7982 Long term (current) use of aspirin: Secondary | ICD-10-CM

## 2011-09-10 DIAGNOSIS — Z8546 Personal history of malignant neoplasm of prostate: Secondary | ICD-10-CM

## 2011-09-10 DIAGNOSIS — K219 Gastro-esophageal reflux disease without esophagitis: Secondary | ICD-10-CM | POA: Diagnosis present

## 2011-09-10 DIAGNOSIS — E46 Unspecified protein-calorie malnutrition: Secondary | ICD-10-CM | POA: Diagnosis present

## 2011-09-10 DIAGNOSIS — E781 Pure hyperglyceridemia: Secondary | ICD-10-CM | POA: Diagnosis present

## 2011-09-10 DIAGNOSIS — I10 Essential (primary) hypertension: Secondary | ICD-10-CM | POA: Diagnosis present

## 2011-09-10 DIAGNOSIS — I2510 Atherosclerotic heart disease of native coronary artery without angina pectoris: Secondary | ICD-10-CM | POA: Diagnosis present

## 2011-09-10 DIAGNOSIS — Z87891 Personal history of nicotine dependence: Secondary | ICD-10-CM

## 2011-09-10 DIAGNOSIS — K7010 Alcoholic hepatitis without ascites: Secondary | ICD-10-CM | POA: Diagnosis present

## 2011-09-10 HISTORY — DX: Weakness: R53.1

## 2011-09-10 LAB — DIFFERENTIAL
Basophils Relative: 0 % (ref 0–1)
Monocytes Absolute: 0.3 10*3/uL (ref 0.1–1.0)
Monocytes Relative: 8 % (ref 3–12)
Neutro Abs: 2.6 10*3/uL (ref 1.7–7.7)

## 2011-09-10 LAB — COMPREHENSIVE METABOLIC PANEL
Alkaline Phosphatase: 71 U/L (ref 39–117)
BUN: 8 mg/dL (ref 6–23)
Chloride: 94 mEq/L — ABNORMAL LOW (ref 96–112)
GFR calc Af Amer: >90 mL/min (ref >90)
Glucose, Bld: 76 mg/dL (ref 70–99)
Potassium: 3.6 mEq/L (ref 3.5–5.1)
Total Bilirubin: 2.3 mg/dL — ABNORMAL HIGH (ref 0.3–1.2)

## 2011-09-10 LAB — URINALYSIS, ROUTINE W REFLEX MICROSCOPIC
Hgb urine dipstick: NEGATIVE
Specific Gravity, Urine: 1.015 (ref 1.005–1.030)
Urobilinogen, UA: 1 mg/dL (ref 0.0–1.0)
pH: 6 (ref 5.0–8.0)

## 2011-09-10 LAB — CBC
HCT: 37.9 % — ABNORMAL LOW (ref 39.0–52.0)
Hemoglobin: 13 g/dL (ref 13.0–17.0)
MCHC: 34.3 g/dL (ref 30.0–36.0)
MCV: 95.9 fL (ref 78.0–100.0)

## 2011-09-10 LAB — AMMONIA: Ammonia: 45 umol/L (ref 11–60)

## 2011-09-10 MED ORDER — ONDANSETRON HCL 4 MG/2ML IJ SOLN
4.0000 mg | Freq: Once | INTRAMUSCULAR | Status: AC
  Administered 2011-09-10: 4 mg via INTRAVENOUS
  Filled 2011-09-10: qty 2

## 2011-09-10 MED ORDER — SODIUM CHLORIDE 0.9 % IV BOLUS (SEPSIS)
500.0000 mL | Freq: Once | INTRAVENOUS | Status: AC
  Administered 2011-09-10: 500 mL via INTRAVENOUS

## 2011-09-10 NOTE — ED Notes (Signed)
Pt from home, family says he is severely weak. Seen here last Thursday but unsure why.

## 2011-09-10 NOTE — ED Notes (Signed)
Pt also complains of n/v. Pt shaking with medics. BP 156/92 HR 96 O2 97% RA. CBG 78. Received 300 ml in route. History of CABG x2, HTN, GERD. No allergies.
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
## 2011-09-11 ENCOUNTER — Encounter (HOSPITAL_COMMUNITY): Payer: Self-pay | Admitting: *Deleted

## 2011-09-11 DIAGNOSIS — F1010 Alcohol abuse, uncomplicated: Secondary | ICD-10-CM

## 2011-09-11 DIAGNOSIS — R5381 Other malaise: Secondary | ICD-10-CM

## 2011-09-11 DIAGNOSIS — G621 Alcoholic polyneuropathy: Secondary | ICD-10-CM

## 2011-09-11 DIAGNOSIS — Z9181 History of falling: Secondary | ICD-10-CM

## 2011-09-11 DIAGNOSIS — R5383 Other fatigue: Secondary | ICD-10-CM

## 2011-09-11 LAB — BASIC METABOLIC PANEL
BUN: 10 mg/dL (ref 6–23)
GFR calc Af Amer: >90 mL/min (ref >90)
GFR calc non Af Amer: 90 mL/min — ABNORMAL LOW (ref >90)
Potassium: 3.3 mEq/L — ABNORMAL LOW (ref 3.5–5.1)

## 2011-09-11 LAB — HEPATIC FUNCTION PANEL
AST: 76 U/L — ABNORMAL HIGH (ref 0–37)
Albumin: 2.6 g/dL — ABNORMAL LOW (ref 3.5–5.2)
Total Bilirubin: 2.6 mg/dL — ABNORMAL HIGH (ref 0.3–1.2)
Total Protein: 5.2 g/dL — ABNORMAL LOW (ref 6.0–8.3)

## 2011-09-11 LAB — CBC
Hemoglobin: 11 g/dL — ABNORMAL LOW (ref 13.0–17.0)
MCHC: 34.1 g/dL (ref 30.0–36.0)
RDW: 13.8 % (ref 11.5–15.5)
WBC: 2.3 10*3/uL — ABNORMAL LOW (ref 4.0–10.5)

## 2011-09-11 LAB — MAGNESIUM: Magnesium: 1.1 mg/dL — ABNORMAL LOW (ref 1.5–2.5)

## 2011-09-11 LAB — PROTIME-INR
INR: 1.11 (ref 0.00–1.49)
Prothrombin Time: 14.5 seconds (ref 11.6–15.2)

## 2011-09-11 LAB — SEDIMENTATION RATE: Sed Rate: 7 mm/hr (ref 0–16)

## 2011-09-11 MED ORDER — VITAMIN B-1 100 MG PO TABS
100.0000 mg | ORAL_TABLET | Freq: Every day | ORAL | Status: DC
  Administered 2011-09-11 – 2011-09-13 (×3): 100 mg via ORAL
  Filled 2011-09-11 (×3): qty 1

## 2011-09-11 MED ORDER — PROMETHAZINE HCL 25 MG/ML IJ SOLN
12.5000 mg | Freq: Four times a day (QID) | INTRAMUSCULAR | Status: DC | PRN
  Filled 2011-09-11: qty 1

## 2011-09-11 MED ORDER — SIMVASTATIN 20 MG PO TABS
20.0000 mg | ORAL_TABLET | Freq: Every day | ORAL | Status: DC
  Administered 2011-09-11 – 2011-09-12 (×2): 20 mg via ORAL
  Filled 2011-09-11 (×3): qty 1

## 2011-09-11 MED ORDER — NITROGLYCERIN 0.4 MG SL SUBL: 0.4000 mg | SUBLINGUAL_TABLET | SUBLINGUAL | Status: DC | PRN

## 2011-09-11 MED ORDER — TRAMADOL HCL 50 MG PO TABS: 50.0000 mg | ORAL_TABLET | Freq: Three times a day (TID) | ORAL | Status: DC | PRN

## 2011-09-11 MED ORDER — ACETAMINOPHEN 325 MG PO TABS
650.0000 mg | ORAL_TABLET | Freq: Four times a day (QID) | ORAL | Status: DC | PRN
  Filled 2011-09-11: qty 2

## 2011-09-11 MED ORDER — TAMSULOSIN HCL 0.4 MG PO CAPS
0.4000 mg | ORAL_CAPSULE | Freq: Every day | ORAL | Status: DC
  Administered 2011-09-11 – 2011-09-13 (×3): 0.4 mg via ORAL
  Filled 2011-09-11 (×3): qty 1

## 2011-09-11 MED ORDER — ADULT MULTIVITAMIN W/MINERALS CH
1.0000 | ORAL_TABLET | Freq: Every day | ORAL | Status: DC
  Filled 2011-09-11: qty 1

## 2011-09-11 MED ORDER — SODIUM CHLORIDE 0.9 % IV SOLN: INTRAVENOUS | Status: AC

## 2011-09-11 MED ORDER — HEPARIN SODIUM (PORCINE) 5000 UNIT/ML IJ SOLN
5000.0000 [IU] | Freq: Three times a day (TID) | INTRAMUSCULAR | Status: DC
  Administered 2011-09-11 – 2011-09-13 (×6): 5000 [IU] via SUBCUTANEOUS
  Filled 2011-09-11 (×9): qty 1

## 2011-09-11 MED ORDER — LORAZEPAM 1 MG PO TABS
1.0000 mg | ORAL_TABLET | Freq: Four times a day (QID) | ORAL | Status: DC | PRN
  Administered 2011-09-12 – 2011-09-13 (×2): 1 mg via ORAL
  Filled 2011-09-11 (×3): qty 1

## 2011-09-11 MED ORDER — THIAMINE HCL 100 MG/ML IJ SOLN
Freq: Once | INTRAVENOUS | Status: AC
  Administered 2011-09-11: 04:00:00 via INTRAVENOUS

## 2011-09-11 MED ORDER — ONDANSETRON 8 MG/NS 50 ML IVPB: 8.0000 mg | Freq: Four times a day (QID) | INTRAVENOUS | Status: DC | PRN

## 2011-09-11 MED ORDER — MAGNESIUM SULFATE 40 MG/ML IJ SOLN
2.0000 g | Freq: Once | INTRAMUSCULAR | Status: AC
  Administered 2011-09-11: 2 g via INTRAVENOUS
  Filled 2011-09-11: qty 50

## 2011-09-11 MED ORDER — THIAMINE HCL 100 MG/ML IJ SOLN
100.0000 mg | Freq: Every day | INTRAMUSCULAR | Status: DC
  Filled 2011-09-11: qty 1

## 2011-09-11 MED ORDER — LISINOPRIL 5 MG PO TABS
5.0000 mg | ORAL_TABLET | Freq: Every day | ORAL | Status: DC
  Administered 2011-09-11 – 2011-09-13 (×3): 5 mg via ORAL
  Filled 2011-09-11 (×3): qty 1

## 2011-09-11 MED ORDER — PANTOPRAZOLE SODIUM 40 MG PO TBEC
40.0000 mg | DELAYED_RELEASE_TABLET | Freq: Every day | ORAL | Status: DC
  Administered 2011-09-11 – 2011-09-13 (×3): 40 mg via ORAL
  Filled 2011-09-11 (×3): qty 1

## 2011-09-11 MED ORDER — SODIUM CHLORIDE 0.9 % IJ SOLN
3.0000 mL | Freq: Two times a day (BID) | INTRAMUSCULAR | Status: DC
  Administered 2011-09-11 – 2011-09-13 (×4): 3 mL via INTRAVENOUS

## 2011-09-11 MED ORDER — METOPROLOL TARTRATE 50 MG PO TABS
50.0000 mg | ORAL_TABLET | Freq: Two times a day (BID) | ORAL | Status: DC
  Administered 2011-09-11 – 2011-09-13 (×3): 50 mg via ORAL
  Filled 2011-09-11 (×6): qty 1

## 2011-09-11 MED ORDER — FOLIC ACID 1 MG PO TABS
1.0000 mg | ORAL_TABLET | Freq: Every day | ORAL | Status: DC
  Administered 2011-09-11 – 2011-09-13 (×3): 1 mg via ORAL
  Filled 2011-09-11 (×3): qty 1

## 2011-09-11 MED ORDER — LORAZEPAM 2 MG/ML IJ SOLN: 1.0000 mg | Freq: Four times a day (QID) | INTRAMUSCULAR | Status: DC | PRN

## 2011-09-11 MED ORDER — SODIUM CHLORIDE 0.45 % IV SOLN
INTRAVENOUS | Status: DC
  Administered 2011-09-11: 03:00:00 via INTRAVENOUS

## 2011-09-11 MED ORDER — ASPIRIN EC 81 MG PO TBEC
81.0000 mg | DELAYED_RELEASE_TABLET | Freq: Every day | ORAL | Status: DC
  Administered 2011-09-11 – 2011-09-13 (×3): 81 mg via ORAL
  Filled 2011-09-11 (×3): qty 1

## 2011-09-11 MED ORDER — ONDANSETRON HCL 4 MG PO TABS: 8.0000 mg | ORAL_TABLET | Freq: Four times a day (QID) | ORAL | Status: DC | PRN

## 2011-09-11 MED ORDER — ACETAMINOPHEN 650 MG RE SUPP: 650.0000 mg | Freq: Four times a day (QID) | RECTAL | Status: DC | PRN

## 2011-09-11 MED ORDER — ADULT MULTIVITAMIN W/MINERALS CH
1.0000 | ORAL_TABLET | Freq: Every day | ORAL | Status: DC
  Administered 2011-09-11 – 2011-09-13 (×3): 1 via ORAL
  Filled 2011-09-11 (×3): qty 1

## 2011-09-11 MED ORDER — OMEGA-3-ACID ETHYL ESTERS 1 G PO CAPS
1.0000 g | ORAL_CAPSULE | Freq: Every day | ORAL | Status: DC
  Administered 2011-09-11 – 2011-09-13 (×3): 1 g via ORAL
  Filled 2011-09-11 (×3): qty 1

## 2011-09-11 NOTE — Progress Notes (Signed)
Family Medicine Teaching Service [facility] Progress Note  Patient name: [name] Medical record number: [id number] Date of birth: Aug 19, 1935 Age: 76 y.o. Gender: male    LOS: 1 day   Primary Care Provider: Tobin Chad, MD  Overnight Events:  NAEO. Feels less shaky than yesterday. Overall energy improved today.   Objective: Vital signs in last 24 hours: Temp:  [98.3 F (36.8 C)-98.9 F (37.2 C)] 98.9 F (37.2 C) (06/12 0500) Pulse Rate:  [67-100] 67  (06/12 0800) Resp:  [15-25] 18  (06/12 0500) BP: (101-152)/(49-97) 101/51 mmHg (06/12 0800) SpO2:  [95 %-99 %] 96 % (06/12 0500) Weight:  [173 lb 12.8 oz (78.835 kg)] 173 lb 12.8 oz (78.835 kg) (06/12 0045)  Wt Readings from Last 3 Encounters:  09/11/11 173 lb 12.8 oz (78.835 kg)  08/08/11 183 lb (83.008 kg)  06/20/11 180 lb (81.647 kg)     Current Facility-Administered Medications  Medication Dose Route Frequency Provider Last Rate Last Dose  . 0.45 % sodium chloride infusion   Intravenous Continuous Brent Bulla, MD 125 mL/hr at 09/11/11 0313    . 0.9 %  sodium chloride infusion   Intravenous STAT Juliet Rude. Pickering, MD      . acetaminophen (TYLENOL) tablet 650 mg  650 mg Oral Q6H PRN Brent Bulla, MD       Or  . acetaminophen (TYLENOL) suppository 650 mg  650 mg Rectal Q6H PRN Brent Bulla, MD      . aspirin EC tablet 81 mg  81 mg Oral Daily Brent Bulla, MD   81 mg at 09/11/11 4098  . folic acid (FOLVITE) tablet 1 mg  1 mg Oral Daily Brent Bulla, MD   1 mg at 09/11/11 1191  . heparin injection 5,000 Units  5,000 Units Subcutaneous Q8H Brent Bulla, MD      . lisinopril (PRINIVIL,ZESTRIL) tablet 5 mg  5 mg Oral Daily Brent Bulla, MD   5 mg at 09/11/11 0921  . LORazepam (ATIVAN) tablet 1 mg  1 mg Oral Q6H PRN Brent Bulla, MD       Or  . LORazepam (ATIVAN) injection 1 mg  1 mg Intravenous Q6H PRN Brent Bulla, MD      . metoprolol (LOPRESSOR) tablet 50 mg  50 mg Oral BID Brent Bulla, MD      . multivitamin  with minerals tablet 1 tablet  1 tablet Oral Daily Brent Bulla, MD   1 tablet at 09/11/11 0921  . nitroGLYCERIN (NITROSTAT) SL tablet 0.4 mg  0.4 mg Sublingual Q5 Min x 3 PRN Brent Bulla, MD      . omega-3 acid ethyl esters (LOVAZA) capsule 1 g  1 g Oral Daily Brent Bulla, MD   1 g at 09/11/11 4782  . ondansetron (ZOFRAN) tablet 8 mg  8 mg Oral Q6H PRN Brent Bulla, MD       Or  . ondansetron (ZOFRAN) 8 mg/NS 50 ml IVPB  8 mg Intravenous Q6H PRN Brent Bulla, MD      . ondansetron [facility]) injection 4 mg  4 mg Intravenous Once Harrold Donath R. Pickering, MD   4 mg at 09/10/11 2037  . ondansetron (ZOFRAN) injection 4 mg  4 mg Intravenous Once [employer]. Pickering, MD   4 mg at 09/10/11 2210  . pantoprazole (PROTONIX) EC tablet 40 mg  40 mg Oral Q1200 Brent Bulla, MD   40 mg at  09/11/11 0934  . promethazine (PHENERGAN) injection 12.5-25 mg  12.5-25 mg Intravenous Q6H PRN Brent Bulla, MD      . simvastatin (ZOCOR) tablet 20 mg  20 mg Oral QHS Brent Bulla, MD      . sodium chloride 0.9 % 1,000 mL with thiamine 100 mg, folic acid 1 mg, multivitamins adult 10 mL infusion   Intravenous Once Brent Bulla, MD 150 mL/hr at 09/11/11 0422    . sodium chloride 0.9 % bolus 500 mL  500 mL Intravenous Once Harrold Donath R. Pickering, MD   500 mL at 09/10/11 2039  . sodium chloride 0.9 % injection 3 mL  3 mL Intravenous Q12H Brent Bulla, MD      . Tamsulosin HCl [facility]) capsule 0.4 mg  0.4 mg Oral Daily Brent Bulla, MD   0.4 mg at 09/11/11 4098  . thiamine (VITAMIN B-1) tablet 100 mg  100 mg Oral Daily Brent Bulla, MD   100 mg at 09/11/11 1191   Or  . thiamine (B-1) injection 100 mg  100 mg Intravenous Daily Brent Bulla, MD      . traMADol Janean Sark) tablet 50 mg  50 mg Oral Q8H PRN Brent Bulla, MD      . DISCONTD: multivitamin with minerals tablet 1 tablet  1 tablet Oral Daily Brent Bulla, MD         PE: Gen: NAD, frail appearing HEENT: MMM CV: RRR Res: normal effort Ext/Musc:2+ pulses, slight tremor w/  hands extended (improved past baseline per pt) Neuro: CN grossly intact. Movement of extremities symmetrical  Labs/Studies:   Lab 09/11/11 0830 09/11/11 0500 09/10/11 2104 09/05/11 1529  NA -- 137 138 138  K -- 3.3* 3.6 3.1*  CL -- 96 94* 97  CO2 -- 22 18* 25  BUN -- 10 8 5*  CREATININE -- 0.73 0.67 0.71  CALCIUM -- 7.6* 8.3* 8.7  PROT 5.2* -- 6.4 6.4  BILITOT 2.6* -- 2.3* 1.0  ALKPHOS 63 -- 71 74  ALT 42 -- 57* 47  AST 76* -- 119* 105*  GLUCOSE -- 76 76 83   Bilirubin     Component Value Date/Time   BILITOT 2.6* 09/11/2011 0830   BILIDIR 0.6* 09/11/2011 0830   IBILI 2.0* 09/11/2011 0830    CBC:    Component Value Date/Time   WBC 2.3* 09/11/2011 0500   HGB 11.0* 09/11/2011 0500   HCT 32.3* 09/11/2011 0500   PLT 98* 09/11/2011 0500   MCV 96.1 09/11/2011 0500   NEUTROABS 2.6 09/10/2011 2104   LYMPHSABS 0.6* 09/10/2011 2104   MONOABS 0.3 09/10/2011 2104   EOSABS 0.0 09/10/2011 2104   BASOSABS 0.0 09/10/2011 2104   INR/Prothrombin Time: 1.1/14.5  Sed Rate: 7  Urinalysis    Component Value Date/Time   COLORURINE AMBER* 09/10/2011 2144   APPEARANCEUR CLEAR 09/10/2011 2144   LABSPEC 1.015 09/10/2011 2144   PHURINE 6.0 09/10/2011 2144   GLUCOSEU NEGATIVE 09/10/2011 2144   HGBUR NEGATIVE 09/10/2011 2144   BILIRUBINUR SMALL* 09/10/2011 2144   KETONESUR 40* 09/10/2011 2144   PROTEINUR NEGATIVE 09/10/2011 2144   UROBILINOGEN 1.0 09/10/2011 2144   NITRITE NEGATIVE 09/10/2011 2144   LEUKOCYTESUR NEGATIVE 09/10/2011 2144    Assessment/Plan: [name] is a 76 y.o. year old male presenting with weakness and a history of alcohol abuse, w/ resolving alcohol withdrawal   1. Weakness: Likely from malnutrition and alcohol withdrawal. Sed rate and CK normal. Improvement in overall  functional status today. Taking PO.  1. PT/OT to eval and treat 2. Likely to benefit from CIR. Will consult today  2. Alcohol abuse: Pt w/o alcohol for > 5 days now. CIWA protocol in place. No ativan given.   1. Ativan PRN 2. Cont Multivitamin, Thimine, Folic acid 3. Continue CIWA   3. Depression: Not currently depressed. No medications given this admission and w/o therapy prior to admission. 1. Recommend outpt f/u  4. Hyperlipidemia:  1. Continue statin  5. HTN/Atrial fibrillation: Currently rate controlled. Not on any anticoagulation presumably due to fall risk.  1. Continue lisinopril, metoprolol 2. Cont ASA  6. FEN/GI: Heart healthy diet.  7. Prophylaxis: Subcutaneous heparin  8. Disposition: Pending improvement, potential transfer to CIR today vs tomorrow  LOS 1  Signed: Shelly Flatten, MD Family Medicine Resident PGY-1 [phone] 09/11/2011 9:40 AM
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
## 2011-09-11 NOTE — Evaluation (Signed)
Physical Therapy Evaluation Patient Details Name: [name] MRN: [id number] DOB: 1935-09-15 Today's Date: 09/11/2011 Time: 4098-1191 PT Time Calculation (min): 35 min  PT Assessment / Plan / Recommendation Clinical Impression  Pt is a 76 y/o male admitted for weakness and malnutrition. Pt lives alone with nearby family (neice) and neighbors for over 50 years who can "check on him" intermittently.  Pt presents with significant LE weakness and would be unsafe to live alone at this time.  Pt may return to home if 24/7 supervision is available (live in help).  If this is not possible pt would benefit from consideration of short term SNF placement.  Pt may also be CIR candidate if  24 hour supervision is available upon D/C.      PT Assessment  Patient needs continued PT services    Follow Up Recommendations  Inpatient Rehab;Supervision/Assistance - 24 hour    Barriers to Discharge Decreased caregiver support Pt lives alone    lEquipment Recommendations  Defer to next venue    Recommendations for Other Services OT consult;Rehab consult   Frequency Min 3X/week    Precautions / Restrictions Precautions Precautions: Fall Restrictions Weight Bearing Restrictions: No   Pertinent Vitals/Pain No c/o pain .        Mobility  Bed Mobility Bed Mobility: Supine to Sit;Sit to Supine Supine to Sit: 4: Min assist;HOB flat Sit to Supine: 4: Min assist;HOB flat Details for Bed Mobility Assistance: Pt pulling on my hand to raise trunk from bed.  Assist for bilateral LE to transition from sit to supine  Transfers Transfers: Sit to Stand;Stand to Sit;Stand Pivot Transfers Sit to Stand: 4: Min assist;With upper extremity assist;From bed Stand to Sit: 4: Min assist;To bed;To chair/3-in-1;With upper extremity assist Stand Pivot Transfers: 4: Min assist Details for Transfer Assistance: Assist to steady pt. Pt required assistance to fully extend LEs.  Cues for hand placement and safe techniques.    Ambulation/Gait Ambulation/Gait Assistance: 4: Min assist Ambulation Distance (Feet): 20 Feet Assistive device: Rolling walker Ambulation/Gait Assistance Details: Verbal and tactile cueing for upright posture and increased step length.  Assist to manage walker.  Cueing for appropriate spacing from walker as pt pushes walker too far ahead.   Gait Pattern: Decreased step length - right;Step-to pattern;Shuffle;Trunk flexed Gait velocity: decreased General Gait Details: Pt unsteady and shake secondary to intention tremors in UEs.   Stairs: No Naval architect Mobility: No Modified Rankin (Stroke Patients Only) Pre-Morbid Rankin Score: Severe disability (Pt reports not being able to stand prior to admission)    Exercises Total Joint Exercises Long Arc Quad: 10 reps;Both;Seated   PT Diagnosis: Difficulty walking;Abnormality of gait;Generalized weakness  PT Problem List: Decreased mobility;Decreased strength;Decreased coordination;Decreased skin integrity;Decreased activity tolerance PT Treatment Interventions: DME instruction;Gait training;Functional mobility training;Therapeutic activities;Therapeutic exercise;Neuromuscular re-education;Patient/family education   PT Goals Acute Rehab PT Goals PT Goal Formulation: With patient Time For Goal Achievement: 09/25/11 Potential to Achieve Goals: Good Pt will go Supine/Side to Sit: Independently PT Goal: Supine/Side to Sit - Progress: Goal set today Pt will go Sit to Supine/Side: Independently;with HOB 0 degrees PT Goal: Sit to Supine/Side - Progress: Goal set today Pt will go Sit to Stand: Independently PT Goal: Sit to Stand - Progress: Goal set today Pt will go Stand to Sit: Independently PT Goal: Stand to Sit - Progress: Goal set today Pt will Transfer Bed to Chair/Chair to Bed: Independently PT Transfer Goal: Bed to Chair/Chair to Bed - Progress: Goal set today Pt will Ambulate:  51 - 150 feet;with modified independence;with  least restrictive assistive device PT Goal: Ambulate - Progress: Goal set today Pt will Perform Home Exercise Program: Independently PT Goal: Perform Home Exercise Program - Progress: Goal set today  Visit Information  Last PT Received On: 09/11/11    Subjective Data  Subjective: I am looking forward to getting up. Patient Stated Goal: I would like to be able to stand up and walk.and be able to live alone.     Prior Functioning  Home Living Lives With: Alone Available Help at Discharge: Family;Friend(s) Type of Home: House Home Access: Level entry Home Layout: One level Bathroom Shower/Tub: Tub/shower unit;Curtain Firefighter: Standard Bathroom Accessibility: Yes How Accessible: Accessible via wheelchair;Accessible via walker Home Adaptive Equipment: Grab bars around toilet;Grab bars in shower;Hand-held shower hose;Walker - rolling;Shower chair with back;Bedside commode/3-in-1 Prior Function Level of Independence: Independent Able to Take Stairs?: Yes Driving: Yes Vocation: Retired Musician: No difficulties Dominant Hand: Right    Cognition  Overall Cognitive Status: Appears within functional limits for tasks assessed/performed Arousal/Alertness: Awake/alert Orientation Level: Oriented X4 / Intact Behavior During Session: [facility] for tasks performed    Extremity/Trunk Assessment Right Upper Extremity Assessment RUE ROM/Strength/Tone: Within functional levels;WFL for tasks assessed RUE Coordination: WFL - gross motor Left Upper Extremity Assessment LUE ROM/Strength/Tone: WFL for tasks assessed;Within functional levels Right Lower Extremity Assessment RLE ROM/Strength/Tone: Deficits RLE ROM/Strength/Tone Deficits: 4-/5 gross LE strength.  ROM WFL.  RLE Sensation: WFL - Proprioception;WFL - Light Touch RLE Coordination: Deficits RLE Coordination Deficits: intentional tremors in bilateral LEs  Left Lower Extremity Assessment LLE ROM/Strength/Tone:  Deficits LLE ROM/Strength/Tone Deficits: 4-/5 gross LE strength. ROM WFL LLE Sensation: WFL - Proprioception;WFL - Light Touch LLE Coordination: Deficits LLE Coordination Deficits: Same as RLE Trunk Assessment Trunk Assessment: Kyphotic   Balance Balance Balance Assessed: Yes Static Sitting Balance Static Sitting - Balance Support: Feet supported Static Sitting - Level of Assistance: 7: Independent Static Sitting - Comment/# of Minutes: 3+ minutes with no LOB Static Standing Balance Static Standing - Balance Support: Bilateral upper extremity supported Static Standing - Level of Assistance: 5: Stand by assistance Static Standing - Comment/# of Minutes: 1+ minute with mild a-p sway but no LOB.    End of Session PT - End of Session Equipment Utilized During Treatment: Gait belt Activity Tolerance: Patient limited by fatigue Patient left: in chair;with call bell/phone within reach Nurse Communication: Mobility status   Zyiere Rosemond 09/11/2011, 9:30 AM Theron Arista L. Lonya Johannesen DPT [phone]

## 2011-09-11 NOTE — Progress Notes (Signed)
Occupational Therapy Evaluation Patient Details Name: CHAPIN ARDUINI MRN: [id number] DOB: 16-Jul-1935 Today's Date: 09/11/2011 Time: 1478-2956 OT Time Calculation (min): 19 min  OT Assessment / Plan / Recommendation Clinical Impression    Pt is a 76 y/o male admitted for weakness and malnutrition. Pt. is unsafe to return home alone. Would recommend SNF or 24 hour assistance. Pt. would benefit from OT to increase activity tolerance and increase independence in ADLs. OT to follow acutely.    OT Assessment  Patient needs continued OT Services    Follow Up Recommendations  Supervision/Assistance - 24 hour;Skilled nursing facility    Barriers to Discharge      Equipment Recommendations  Defer to next venue    Recommendations for Other Services    Frequency  Min 2X/week    Precautions / Restrictions Precautions Precautions: Fall Restrictions Weight Bearing Restrictions: No   Pertinent Vitals/Pain Vitals Monitored and Stable. No pain reported.    ADL  Eating/Feeding: Performed;Modified independent Where Assessed - Eating/Feeding: Chair Grooming: Simulated;Modified independent Where Assessed - Grooming: Unsupported sitting Toilet Transfer: Simulated;Minimal assistance Toilet Transfer Method: Sit to stand Toilet Transfer Equipment: Regular height toilet Equipment Used: Gait belt;Rolling walker    OT Diagnosis: Generalized weakness;Ataxia  OT Problem List: Decreased strength;Decreased activity tolerance;Impaired balance (sitting and/or standing);Decreased coordination OT Treatment Interventions: Self-care/ADL training;Therapeutic exercise;DME and/or AE instruction;Patient/family education;Balance training;Therapeutic activities   OT Goals Acute Rehab OT Goals OT Goal Formulation: With patient Time For Goal Achievement: 09/25/11 Potential to Achieve Goals: Fair ADL Goals Pt Will Perform Grooming: with set-up;Sitting at sink;Supported ADL Goal: Grooming - Progress: Goal set  today Pt Will Perform Upper Body Bathing: with set-up;Sitting at sink ADL Goal: Upper Body Bathing - Progress: Goal set today Pt Will Perform Lower Body Bathing: Sit to stand from chair;with set-up ADL Goal: Lower Body Bathing - Progress: Goal set today Pt Will Perform Upper Body Dressing: with set-up;Sitting, chair;Supported ADL Goal: Location manager Dressing - Progress: Goal set today Pt Will Perform Lower Body Dressing: Sit to stand from chair;with set-up ADL Goal: Lower Body Dressing - Progress: Goal set today Pt Will Transfer to Toilet: Ambulation;3-in-1;with supervision ADL Goal: Toilet Transfer - Progress: Goal set today Pt Will Perform Toileting - Clothing Manipulation: Standing;with supervision ADL Goal: Toileting - Clothing Manipulation - Progress: Goal set today Pt Will Perform Toileting - Hygiene: Leaning right and/or left on 3-in-1/toilet;with modified independence ADL Goal: Toileting - Hygiene - Progress: Goal set today Pt Will Perform Tub/Shower Transfer: with modified independence;Ambulation;Shower seat with back ADL Goal: Tub/Shower Transfer - Progress: Goal set today  Visit Information  Last OT Received On: 09/11/11 Assistance Needed: +1    Subjective Data  Subjective: "I am weak."   Prior Functioning  Home Living Lives With: Alone Available Help at Discharge: Family;Friend(s) Type of Home: House Home Access: Level entry Home Layout: One level Bathroom Shower/Tub: Tub/shower unit;Curtain Firefighter: Standard Bathroom Accessibility: Yes How Accessible: Accessible via wheelchair;Accessible via walker Home Adaptive Equipment: Grab bars around toilet;Grab bars in shower;Hand-held shower hose;Walker - rolling;Shower chair with back;Bedside commode/3-in-1 Prior Function Level of Independence: Independent Able to Take Stairs?: Yes Driving: Yes Vocation: Retired Musician: No difficulties Dominant Hand: Right    Cognition  Overall Cognitive  Status: Appears within functional limits for tasks assessed/performed Arousal/Alertness: Awake/alert Orientation Level: Appears intact for tasks assessed Behavior During Session: [facility] for tasks performed    Extremity/Trunk Assessment Right Upper Extremity Assessment RUE ROM/Strength/Tone: [facility] for tasks assessed RUE Coordination: Deficits RUE Coordination Deficits: Pt.  with intentional tremor Left Upper Extremity Assessment LUE ROM/Strength/Tone: WFL for tasks assessed LUE Coordination: Deficits LUE Coordination Deficits: Pt. with intentional tremor   Mobility Bed Mobility Bed Mobility: Supine to Sit Supine to Sit: With rails;5: Supervision;HOB flat Transfers Transfers: Sit to Stand;Stand to Sit Sit to Stand: 4: Min assist;With upper extremity assist;From bed Stand to Sit: 4: Min assist;To bed;To chair/3-in-1;With upper extremity assist Details for Transfer Assistance: Required vc's for safe hand placement and correct use of walker   Exercise    Balance    End of Session OT - End of Session Activity Tolerance: Patient tolerated treatment well;Patient limited by fatigue Patient left: in chair;with call bell/phone within reach   Jenell Milliner 09/11/2011, 2:33 PM  Lucile Shutters   OTR/L Pager: [phone] Office: [phone] .

## 2011-09-11 NOTE — Progress Notes (Signed)
Seen and examined.  Agree with Dr. Konrad Dolores.  My note for today is contained in the co sign of the H and PE.

## 2011-09-11 NOTE — Clinical Social Work Psychosocial (Signed)
     Clinical Social Work Department BRIEF PSYCHOSOCIAL ASSESSMENT 09/11/2011  Patient:  [name], [name]     Account Number:  [id number]     Admit date:  09/10/2011  Clinical Social Worker:  Lourdes Sledge  Date/Time:  09/11/2011 01:51 PM  Referred by:  CSW  Date Referred:  09/11/2011 Referred for  SNF Placement   Other Referral:   CIR VS HH 24/care VS SNF   Interview type:  Patient Other interview type:    PSYCHOSOCIAL DATA Living Status:  ALONE Admitted from facility:   Level of care:   Primary support name:  Cindi Carbon & Zollie Pee Primary support relationship to patient:  FAMILY Degree of support available:   Pt states he lives alone however has a niece and nephew who live nearby and check on pt often.    CURRENT CONCERNS Current Concerns  Post-Acute Placement   Other Concerns:    SOCIAL WORK ASSESSMENT / PLAN CSW observed that pt is being evaluated by CIR. CSW assessed pt as a back up option.    CSW visited pt room and discussed disposition options. Pt stated he is interested in CIR however currently does not have 24hr care at home. Pt stated he is very close to his neice and nephew and believes they would be able to help pt if he went to CIR and needed support at home. Pt would not give CSW consent to contact his niece or nephew as he stated he wanted to talk to them.    CSW talked to pt about SNF options at dc if pt would not be able to go to CIR. Pt was hesitant but stated CSW could fax him out and pt would consider placement at dc. CSW informed pt that it was important that his family was contacted soon as pt could potentially be cleared for dc this week. Pt stated he would call his niece and have her to call CSW. CSW will await a call from pt niece to discuss dispositition options.    CSW also talked to pt about his history of substance abuse. Pt stated he started drinking as a teenager however has not drank in a month. Pt did not think he had an alcohol  problem. Pt could not give CSW an approx number of how many beers he drinks when he does drink. Pt did not seem to be giving CSW accurate information pertaining to his alcohol usage.   Assessment/plan status:  Psychosocial Support/Ongoing Assessment of Needs Other assessment/ plan:   Information/referral to community resources:   CSW provided pt with a SNF list and CSW contact information. CSW discussed substance abuse options however pt does not believe he has a problem with alcohol.    PATIENTS/FAMILYS RESPONSE TO PLAN OF CARE: Pt laying in bed, alert and oriented. Pt very pleasant to speak to and open to discussing SNF placement. Pt not agreeable for CSW to call family however will have his niece contact CSW. Pt will consider SNF options at dc.

## 2011-09-11 NOTE — H&P (Signed)
Seen and examined.  Discussed with Dr. Louanne Belton.  Agree with his management.  Briefly, 76 yo male with longstanding alcohol abuse who lives alone.  States he just quit drinking (seen in ER ~1week ago with alcohol level of 300) Has bilateral leg weakness and tremulousness.  No bowel or bladder problems.  No leg or back pain.  No numbness. No trauma. EtOH withdrawal - CIWA protocol Leg weakness- likely multifactoreal.  Nutrition, alcohol, deconditioning.  Appreciate PT recs.  Since he desires to return to independent living, I think he would be an excellent candidate for CRIF

## 2011-09-12 LAB — VITAMIN D 25 HYDROXY (VIT D DEFICIENCY, FRACTURES): Vit D, 25-Hydroxy: 20 ng/mL — ABNORMAL LOW (ref 30–89)

## 2011-09-12 MED ORDER — VITAMIN D3 25 MCG (1000 UNIT) PO TABS
1000.0000 [IU] | ORAL_TABLET | Freq: Every day | ORAL | Status: DC
  Administered 2011-09-12 – 2011-09-13 (×2): 1000 [IU] via ORAL
  Filled 2011-09-12 (×2): qty 1

## 2011-09-12 NOTE — Progress Notes (Signed)
Seen and examined.  He is getting stronger.  Plan is to DC home to live with family with home PT support.  I feel he needs another 24 hours in hospital before safe to DC.

## 2011-09-12 NOTE — Progress Notes (Addendum)
Clinical Child psychotherapist (CSW) informed that pt insurance did not approve CIR. CSW informed pt who understood and reports he wants to go home as he cannot afford copays to go to a SNF. Pt gave CSW consent to contact his nephew Kathlene November [phone]. CSW briefly spoke with Kathlene November about his ability to care for pt at dc. Kathlene November was in a middle of a meeting and stated he and his wife can provide care for pt however are concerned he will begin drinking again. Nephew confirmed that he will have pt move in with them so pt will not be alone. CSW made aware that pt is ready for dc and plans will need to confirmed. Nephew will contact CSW after his meeting. For now plan is for pt to go home with [facility]. CSW informed Dr.Merrell and RNCM.  CSW has still faxed pt out to SNF in [facility] in the instance that pt changes his mind to SNF placement.  Theresia Bough, MSW, Theresia Majors [phone]

## 2011-09-12 NOTE — Care Management Note (Addendum)
    Page 1 of 1   09/13/2011     11:41:30 AM   CARE MANAGEMENT NOTE 09/13/2011  Patient:  [name], [name]   Account Number:  [id number]  Date Initiated:  09/12/2011  Documentation initiated by:  GRAVES-BIGELOW,BRENDA  Subjective/Objective Assessment:   Pt admitted with weakness. Plan for home today with nephew.     Action/Plan:   Pt chose [facility] for services. CM mad referral for services. SOC to begin within 24-48 hours post d/c.   Anticipated DC Date:  09/12/2011   Anticipated DC Plan:  HOME W HOME HEALTH SERVICES      DC Planning Services  CM consult      [facility] Choice  HOME HEALTH   Choice offered to / List presented to:  [name]        HH arranged  HH-1 RN  HH-2 PT      [facility] agency  [facility]   Status of service:  Completed, signed off Medicare Important Message given?   (If response is "NO", the following Medicare IM given date fields will be blank) Date Medicare IM given:   Date Additional Medicare IM given:    Discharge Disposition:  HOME W HOME HEALTH SERVICES  Per UR Regulation:  Reviewed for med. necessity/level of care/duration of stay  If discussed at Long Length of Stay Meetings, dates discussed:    Comments:  09/13/11- 1130- Donn Pierini RN, BSN [phone] Pt for discharge today, [facility] notified of discharge.

## 2011-09-12 NOTE — Progress Notes (Signed)
Physical Therapy Treatment Patient Details Name: [name] MRN: [id number] DOB: [birth year] Today's Date: 09/12/2011 Time: 1740-1757 PT Time Calculation (min): 17 min  PT Assessment / Plan / Recommendation Comments on Treatment Session       Follow Up Recommendations  Inpatient Rehab;Supervision/Assistance - 24 hour    Barriers to Discharge        Equipment Recommendations  Defer to next venue    Recommendations for Other Services    Frequency Min 3X/week   Plan Discharge plan remains appropriate;Frequency remains appropriate    Precautions / Restrictions Precautions Precautions: Fall Restrictions Weight Bearing Restrictions: No   Pertinent Vitals/Pain No c/o pain     Mobility  Bed Mobility Bed Mobility: Supine to Sit;Sit to Supine Supine to Sit: 6: Modified independent (Device/Increase time);HOB elevated;With rails Sit to Supine: 6: Modified independent (Device/Increase time);HOB elevated;With rail Scooting to [facility]: 6: Modified independent (Device/Increase time);With rail Details for Bed Mobility Assistance: pt required no asssistance or cueing. Pt did require use of rails and extra effort.   Transfers Transfers: Sit to Stand;Stand to Sit Sit to Stand: 5: Supervision;With upper extremity assist;From bed;From chair/3-in-1 Stand to Sit: 5: Supervision;To bed;To chair/3-in-1 Details for Transfer Assistance: supervision for safety only no cueing required. Demonstrating safe technique. Ambulation/Gait Ambulation/Gait Assistance: 5: Supervision;6: Modified independent (Device/Increase time) Ambulation Distance (Feet): 100 Feet (100 feet x 2) Assistive device: Rolling walker Ambulation/Gait Assistance Details: no assistance or cueing required. Pt fatigues quickly.  Gait Pattern: Within Functional Limits General Gait Details: Fewer tremors noted.   Stairs: No Naval architect Mobility: No Modified Rankin (Stroke Patients Only) Pre-Morbid Rankin Score:  Severe disability    Exercises     PT Diagnosis:    PT Problem List:   PT Treatment Interventions:     PT Goals Acute Rehab PT Goals PT Goal Formulation: With patient Time For Goal Achievement: 09/25/11 Potential to Achieve Goals: Good Pt will go Supine/Side to Sit: Independently PT Goal: Supine/Side to Sit - Progress: Progressing toward goal Pt will go Sit to Supine/Side: Independently;with HOB 0 degrees PT Goal: Sit to Supine/Side - Progress: Progressing toward goal Pt will go Sit to Stand: Independently PT Goal: Sit to Stand - Progress: Progressing toward goal Pt will go Stand to Sit: Independently PT Goal: Stand to Sit - Progress: Progressing toward goal Pt will Transfer Bed to Chair/Chair to Bed: Independently PT Transfer Goal: Bed to Chair/Chair to Bed - Progress: Progressing toward goal Pt will Ambulate: 51 - 150 feet;with modified independence;with least restrictive assistive device PT Goal: Ambulate - Progress: Progressing toward goal Pt will Perform Home Exercise Program: Independently  Visit Information  Last PT Received On: 09/12/11    Subjective Data  Subjective: I feel exhausted Patient Stated Goal: I would like to be able to stand up and walk.and be able to live alone.     Cognition  Overall Cognitive Status: Appears within functional limits for tasks assessed/performed Arousal/Alertness: Awake/alert Orientation Level: Appears intact for tasks assessed Behavior During Session: [facility] for tasks performed    Balance  Balance Balance Assessed: No  End of Session PT - End of Session Equipment Utilized During Treatment: Gait belt Activity Tolerance: Patient limited by fatigue Patient left: in bed;with call bell/phone within reach;with bed alarm set Nurse Communication: Mobility status    Enolia Koepke 09/12/2011, 6:04 PM Wojciech Willetts L. Taariq Leitz DPT [phone]]

## 2011-09-12 NOTE — Progress Notes (Signed)
  [place], [name]   OTR/L Pager: [phone] Office: [phone] .  

## 2011-09-12 NOTE — Progress Notes (Signed)
Utilization review completed.  

## 2011-09-12 NOTE — Progress Notes (Addendum)
Clinical Social Work Department CLINICAL SOCIAL WORK PLACEMENT NOTE 09/12/2011  Patient:  [name], [name]  Account Number:  [id number] Admit date:  09/10/2011  Clinical Social Worker:  Theresia Bough, Theresia Majors  Date/time:  09/12/2011 08:38 AM  Clinical Social Work is seeking post-discharge placement for this patient at the following level of care:   SKILLED NURSING   (*CSW will update this form in Epic as items are completed)   09/11/2011  Patient/family provided with [facility] Department of Clinical Social Work's list of facilities offering this level of care within the geographic area requested by the patient (or if unable, by the patient's family).    Patient/family informed of their freedom to choose among providers that offer the needed level of care, that participate in Medicare, Medicaid or managed care program needed by the patient, have an available bed and are willing to accept the patient.    Patient/family informed of [facility]' ownership interest in [facility], as well as of the fact that they are under no obligation to receive care at this facility.  PASARR submitted to EDS on 09/11/2011 PASARR number received from [employer] on 09/12/11  FL2 transmitted to all facilities in geographic area requested by pt/family on  09/12/11 FL2 transmitted to all facilities within larger geographic area on   Patient informed that his/her managed care company has contracts with or will negotiate with  certain facilities, including the following:     Patient/family informed of bed offers received:   Patient chooses bed at  Physician recommends and patient chooses bed at    Patient to be transferred to  on   Patient to be transferred to facility by   The following physician request were entered in Epic:   Additional Comments: Care F[employer] Pro (system used to complete and submit FL2's) was not working therefore pt has not been faxed out. CSW will attempt to submit his information  09/12/11  Theresia Bough, MSW, Theresia Majors [phone]

## 2011-09-12 NOTE — Progress Notes (Signed)
Occupational Therapy Treatment Patient Details Name: [name] MRN: [id number] DOB: September 08, 1935 Today's Date: 09/12/2011 Time: 4098-1191 OT Time Calculation (min): 23 min  OT Assessment / Plan / Recommendation Comments on Treatment Session Pt. performed toilet transfer with Min A due to vc's needed for safe use of walker. Pt. continued to require vc's due to neglecting walker and leaving it aside while grooming. OT educated pt. on importance and safe use of walker.     Follow Up Recommendations  Supervision/Assistance - 24 hour;Skilled nursing facility    Barriers to Discharge       Equipment Recommendations  Defer to next venue    Recommendations for Other Services    Frequency Min 2X/week   Plan Discharge plan remains appropriate    Precautions / Restrictions Precautions Precautions: Fall Restrictions Weight Bearing Restrictions: No   Pertinent Vitals/Pain No complaints of pain. Vitals Monitored and Stable.    ADL  Grooming: Performed;Brushing hair;Denture care;Minimal assistance (pt. required MinA/verbal cues for safe use of walker) Where Assessed - Grooming: Supported standing Toilet Transfer: Performed;Minimal assistance (verbal cues required for safe use of walker) Toilet Transfer Method: Sit to stand Toilet Transfer Equipment: Raised toilet seat with arms (or 3-in-1 over toilet) Toileting - Clothing Manipulation and Hygiene: Performed;Minimal assistance (needed A fastening snap on pants) Where Assessed - Toileting Clothing Manipulation and Hygiene: Standing ADL Comments: Pt. was ambulating to bathroom with nurse upon entering room. OT proceeded to take pt. to bathroom and pt. performed toilet transfer with Min A due to verbal cues for safe use of walker. Pt. required Min A with clothing management. Pt. performed grooming tasks at sink with no need for assistance, but needed verbal cues for safe use of walker. Pt. pushed walker to side and did not want it in front of  him. OT educated on safety with walker.     OT Diagnosis:    OT Problem List:   OT Treatment Interventions:     OT Goals Acute Rehab OT Goals OT Goal Formulation: With patient Time For Goal Achievement: 09/25/11 Potential to Achieve Goals: Fair ADL Goals Pt Will Perform Grooming: with set-up;Sitting at sink;Supported ADL Goal: Grooming - Progress: Progressing toward goals Pt Will Perform Upper Body Bathing: with set-up;Sitting at sink Pt Will Perform Lower Body Bathing: Sit to stand from chair;with set-up Pt Will Perform Upper Body Dressing: with set-up;Sitting, chair;Supported Pt Will Perform Lower Body Dressing: Sit to stand from chair;with set-up Pt Will Transfer to Toilet: Ambulation;3-in-1;with supervision ADL Goal: Statistician - Progress: Progressing toward goals Pt Will Perform Toileting - Clothing Manipulation: Standing;with supervision ADL Goal: Toileting - Clothing Manipulation - Progress: Progressing toward goals Pt Will Perform Toileting - Hygiene: Leaning right and/or left on 3-in-1/toilet;with modified independence ADL Goal: Toileting - Hygiene - Progress: Met Pt Will Perform Tub/Shower Transfer: with modified independence;Ambulation;Shower seat with back  Visit Information  Last OT Received On: 09/12/11 Assistance Needed: +1    Subjective Data      Prior Functioning       Cognition  Overall Cognitive Status: Appears within functional limits for tasks assessed/performed Arousal/Alertness: Awake/alert Orientation Level: Appears intact for tasks assessed Behavior During Session: [facility] for tasks performed    Mobility Bed Mobility Bed Mobility: Sit to Supine;Scooting to HOB Sit to Supine: 4: Min assist Scooting to [facility]: 4: Min assist Details for Bed Mobility Assistance: Pt. required Min A in helping lift legs on bed. Pt. required vc's in scooting to [facility]. Transfers Transfers: Sit to Stand;Stand to  Sit Sit to Stand: 6: Modified independent (Device/Increase  time);With upper extremity assist;From bed;From toilet Stand to Sit: 6: Modified independent (Device/Increase time);With upper extremity assist;To bed;To toilet   Exercises    Balance    End of Session OT - End of Session Activity Tolerance: Patient tolerated treatment well Patient left: in bed;with bed alarm set;with call bell/phone within reach   StraubMardella Layman 09/12/2011, 10:19 AM

## 2011-09-12 NOTE — Progress Notes (Signed)
Family Medicine Teaching Service [facility] Progress Note  Patient name: [name] Medical record number: [id number] Date of birth: 01-30-1936 Age: 76 y.o. Gender: male    LOS: 2 days   Primary Care Provider: Tobin Chad, MD  Overnight Events:  NAEO. Feeling significantly better than on admission per pt.   Objective: Vital signs in last 24 hours: Temp:  [98 F (36.7 C)-98.9 F (37.2 C)] 98 F (36.7 C) (06/13 0500) Pulse Rate:  [67-76] 70  (06/13 0500) Resp:  [17-20] 20  (06/13 0500) BP: (95-124)/(51-59) 122/56 mmHg (06/13 0500) SpO2:  [95 %-99 %] 95 % (06/13 0500)  Wt Readings from Last 3 Encounters:  09/11/11 173 lb 12.8 oz (78.835 kg)  08/08/11 183 lb (83.008 kg)  06/20/11 180 lb (81.647 kg)     Current Facility-Administered Medications  Medication Dose Route Frequency Provider Last Rate Last Dose  . 0.45 % sodium chloride infusion   Intravenous Continuous Brent Bulla, MD 125 mL/hr at 09/11/11 0313    . 0.9 %  sodium chloride infusion   Intravenous STAT Juliet Rude. Pickering, MD      . acetaminophen (TYLENOL) tablet 650 mg  650 mg Oral Q6H PRN Brent Bulla, MD       Or  . acetaminophen (TYLENOL) suppository 650 mg  650 mg Rectal Q6H PRN Brent Bulla, MD      . aspirin EC tablet 81 mg  81 mg Oral Daily Brent Bulla, MD   81 mg at 09/11/11 0921  . folic acid (FOLVITE) tablet 1 mg  1 mg Oral Daily Brent Bulla, MD   1 mg at 09/11/11 0922  . heparin injection 5,000 Units  5,000 Units Subcutaneous Q8H Brent Bulla, MD   5,000 Units at 09/12/11 [phone]  . lisinopril (PRINIVIL,ZESTRIL) tablet 5 mg  5 mg Oral Daily Brent Bulla, MD   5 mg at 09/11/11 0921  . LORazepam (ATIVAN) tablet 1 mg  1 mg Oral Q6H PRN Brent Bulla, MD       Or  . LORazepam (ATIVAN) injection 1 mg  1 mg Intravenous Q6H PRN Brent Bulla, MD      . magnesium sulfate IVPB 2 g 50 mL  2 g Intravenous Once Ozella Rocks, MD   2 g at 09/11/11 1311  . metoprolol (LOPRESSOR) tablet 50 mg  50 mg Oral BID Brent Bulla, MD   50 mg at 09/11/11 2147  . multivitamin with minerals tablet 1 tablet  1 tablet Oral Daily Brent Bulla, MD   1 tablet at 09/11/11 0921  . nitroGLYCERIN (NITROSTAT) SL tablet 0.4 mg  0.4 mg Sublingual Q5 Min x 3 PRN Brent Bulla, MD      . omega-3 acid ethyl esters (LOVAZA) capsule 1 g  1 g Oral Daily Brent Bulla, MD   1 g at 09/11/11 1191  . ondansetron (ZOFRAN) tablet 8 mg  8 mg Oral Q6H PRN Brent Bulla, MD       Or  . ondansetron (ZOFRAN) 8 mg/NS 50 ml IVPB  8 mg Intravenous Q6H PRN Brent Bulla, MD      . pantoprazole (PROTONIX) EC tablet 40 mg  40 mg Oral Q1200 Brent Bulla, MD   40 mg at 09/11/11 0934  . promethazine (PHENERGAN) injection 12.5-25 mg  12.5-25 mg Intravenous Q6H PRN Brent Bulla, MD      . simvastatin (ZOCOR) tablet 20 mg  20  mg Oral QHS Brent Bulla, MD   20 mg at 09/11/11 2143  . sodium chloride 0.9 % injection 3 mL  3 mL Intravenous Q12H Brent Bulla, MD   3 mL at 09/11/11 2143  . Tamsulosin HCl (FLOMAX) capsule 0.4 mg  0.4 mg Oral Daily Brent Bulla, MD   0.4 mg at 09/11/11 4098  . thiamine (VITAMIN B-1) tablet 100 mg  100 mg Oral Daily Brent Bulla, MD   100 mg at 09/11/11 1191   Or  . thiamine (B-1) injection 100 mg  100 mg Intravenous Daily Brent Bulla, MD      . traMADol Janean Sark) tablet 50 mg  50 mg Oral Q8H PRN Brent Bulla, MD         PE: Gen: NAD, frail appearing, protein calorie malnurished HEENT: MMM  CV: RRR  Res: normal effort  Ext/Musc:2+ pulses, slight tremor w/ hands extended (improved past baseline per pt)  Neuro: CN grossly intact. Movement of extremities symmetrical   Labs/Studies:  VIT-D: 20   Assessment/Plan: [name] is a 76 y.o. year old male presenting with weakness and a history of alcohol abuse, w/ resolving alcohol withdrawal   1. Weakness/protein calorie malnurished: Likely from malnutrition and alcohol withdrawal. Sed rate and CK normal. Improvement in overall functional status today. Taking PO. Pt Feels  considerably stronger today. Low VIt D as ordered by Dr. Sheffield Slider 1. PT/OT to eval and treat 2. Likely to benefit from CIR. Hopeful for transfer today 3. Vit D replacement w/ 1000 IU  2. Alcohol abuse: Pt w/o alcohol for > 6 days now. CIWA protocol in place w/ consistent scores of 2. No ativan given.  1. Ativan PRN 2. Cont Multivitamin, Thimine, Folic acid 3. Continue CIWA  3. Depression: Not currently depressed. No medications given this admission and w/o therapy prior to admission.  1. Recommend outpt f/u  4. Hyperlipidemia:  1. Continue statin  5. HTN/Atrial fibrillation: Currently rate controlled. Not on any anticoagulation presumably due to fall risk.  1. Continue lisinopril, metoprolol 2. Cont ASA 3. INR 1.11  6. FEN/GI: Heart healthy diet.  7. Prophylaxis: Subcutaneous heparin  8. Disposition: Pending improvement, potential transfer to CIR today vs tomorrow  LOS 2  Signed: Shelly Flatten, MD Family Medicine Resident PGY-1 [phone] 09/12/2011 7:51 AM

## 2011-09-12 NOTE — Progress Notes (Signed)
Rehab admissions - Evaluated for possible admission.  I spoke with patient.  He has a niece and nephew who could stay with him for a couple weeks, but they work.  I spoke with insurance carrier.  I will not be able to get precert for inpatient rehab stay per insurance case manager.  Options will be SNF or home with [facility] therapies.  Call me for questions.  #[phone]

## 2011-09-13 MED ORDER — ONDANSETRON HCL 8 MG PO TABS: 8.0000 mg | ORAL_TABLET | Freq: Four times a day (QID) | ORAL | Status: AC | PRN

## 2011-09-13 MED ORDER — VITAMIN D3 25 MCG (1000 UNIT) PO TABS: 1000.0000 [IU] | ORAL_TABLET | Freq: Every day | ORAL | Status: DC

## 2011-09-13 NOTE — Progress Notes (Signed)
Physical Therapy Treatment Patient Details Name: [name] MRN: [id number] DOB: [birth year] Today's Date: 09/13/2011 Time: 1191-4782 PT Time Calculation (min): 28 min  PT Assessment / Plan / Recommendation Comments on Treatment Session  Pt making progress however still needs 24 hour assistance. Pt found sitting in urination, did not call for assist urinate or to be cleaned up demonstrating decreased ability to care for self. Pt having some difficulty getting food to mouth while eating breakfast, reports does not cook or eat well when at home which is what he feels has caused his tremors. Discussed PT recommendation of going to SNF, pt stil desires to go home with HHPT. Pt educated on avoiding uneven terrain unless practiced with HHPT, pt verbalized understanding. Pt's HR elevates to 147 with ambulation, RN aware. Pt with no feelings of rapid pulse, SOB, or chest pain.     Follow Up Recommendations  Supervision/Assistance - 24 hour;Skilled nursing facility    Barriers to Discharge   Decreased caregiver support       Equipment Recommendations  Other (comment) (pt has RW, 3n1 at home. No DME needs anticipated)       Frequency Min 3X/week   Plan Discharge plan remains appropriate;Frequency remains appropriate    Precautions / Restrictions Precautions Precautions: Fall Precaution Comments: Decreased safety awareness Restrictions Weight Bearing Restrictions: No   Pertinent Vitals/Pain No c/o pain    Mobility  Bed Mobility Bed Mobility: Supine to Sit;Sit to Supine Supine to Sit: 6: Modified independent (Device/Increase time);HOB flat Details for Bed Mobility Assistance: Pt requires increased time however no assist needed. Transfers Transfers: Sit to Stand;Stand to Sit Sit to Stand: 5: Supervision;With upper extremity assist;From bed;From chair/3-in-1 Stand to Sit: 5: Supervision;To bed;To chair/3-in-1 Details for Transfer Assistance: supervision for safety verbal cues for safe  UE placement needed. Verbal cues to square up to chair prior to sitting and to bring RW with self as pt tends to leave RW behind. Ambulation/Gait Ambulation/Gait Assistance: 5: Supervision Ambulation Distance (Feet): 160 Feet Assistive device: Rolling walker Ambulation/Gait Assistance Details: Supervision for safety secondary to generalized instability. Verbal cues for proximity of RW. Pt's HR up to 147 bpm during ambulation, RN in hallway and aware reports he does this with ambulation.  Gait Pattern: Decreased step length - right;Decreased step length - left;Trunk flexed (Minimal foot clearance bilaterally) General Gait Details: Fewer tremors noted.   Stairs: No Wheelchair Mobility Wheelchair Mobility: No    Exercises Total Joint Exercises Ankle Circles/Pumps: AROM;Both;20 reps;Seated Quad Sets: AROM;Both;10 reps (5 sec holds) Gluteal Sets: AROM;Both;5 reps;Seated (10 sec holds) Other Exercises Other Exercises: Pt encouraged to continue to perform seated exercises while up in chair to promote improved strength.     PT Goals Acute Rehab PT Goals PT Goal Formulation: With patient Time For Goal Achievement: 09/25/11 Potential to Achieve Goals: Good Pt will go Supine/Side to Sit: Independently PT Goal: Supine/Side to Sit - Progress: Progressing toward goal Pt will go Sit to Supine/Side: Independently;with HOB 0 degrees PT Goal: Sit to Supine/Side - Progress: Progressing toward goal Pt will go Sit to Stand: Independently Pt will go Stand to Sit: Independently PT Goal: Stand to Sit - Progress: Progressing toward goal Pt will Transfer Bed to Chair/Chair to Bed: Independently PT Transfer Goal: Bed to Chair/Chair to Bed - Progress: Progressing toward goal Pt will Ambulate: 51 - 150 feet;with modified independence;with least restrictive assistive device PT Goal: Ambulate - Progress: Progressing toward goal Pt will Perform Home Exercise Program: Independently PT Goal: Perform Home  Exercise  Program - Progress: Progressing toward goal  Visit Information  Last PT Received On: 09/13/11 Assistance Needed: +1    Subjective Data  Subjective: I'm OK Patient Stated Goal: To get back home, be able to walk with walking stick   Cognition  Overall Cognitive Status: Impaired Area of Impairment: Awareness of deficits Arousal/Alertness: Awake/alert Orientation Level: Appears intact for tasks assessed Behavior During Session: [facility] for tasks performed Awareness of Deficits: Pt feels he will be safe to ambulate without RW at home, to ambulate out in grass by self.  Cognition - Other Comments: Pt found lying on very wet pad eating breakfast, pt reports that because of the fluids he has had urgency. Pt did not call for assist to get cleaned up however.       End of Session PT - End of Session Equipment Utilized During Treatment: Gait belt Activity Tolerance: Patient limited by fatigue;Patient tolerated treatment well Patient left: with call bell/phone within reach;in chair Nurse Communication: Mobility status;Other (comment) (elevated HR)    Wilhemina Bonito 09/13/2011, 8:34 AM  Sherie Don) Carleene Mains PT, DPT Acute Rehabilitation [phone]

## 2011-09-13 NOTE — Discharge Summary (Signed)
Seen and examined.  OK to DC home per patient request.  He is medically stable.  He and I are aware that PT recommends SNF for rehab.  He has chosen home with family support and home PT.

## 2011-09-13 NOTE — Progress Notes (Signed)
Late Entry for 09/12/11   CSW spoke with pt nephew Cindi Carbon who confirmed he will have pt stay with him and his wife. Kathlene November reports that his wife stays home and therefore is able to provide 24hr care when Kathlene November is at work. Kathlene November had no additional questions and is agreeable to [facility] services for pt. CSW will defer to [facility] to setup [facility] services. Pt aware and agreeable. No further CSW needs. CSW signing off.  Theresia Bough, MSW, Theresia Majors [phone]

## 2011-09-13 NOTE — Discharge Instructions (Addendum)
You were admitted to the hospital due to alcohol withdrawal and malnurishment. You did great while you were here and wee able to regain much of your strength. It is extremely important that you continue to eat healthy, take a daily vitamin, and do not drink alcohol Please follow up with Dr. Sheffield Slider on 09/19/11 at 8:30am.  Please call the clinic at [phone] if you have any questions.  Have a great day  Alcohol and Nutrition Nutrition serves two purposes. It provides energy. It also maintains body structure and function. Food supplies energy. It also provides the building blocks needed to replace worn or damaged cells. Alcoholics often eat poorly. This limits their supply of essential nutrients. This affects energy supply and structure maintenance. Alcohol also affects the body's nutrients in:  Digestion.   Storage.   Using and getting rid of waste products.  IMPAIRMENT OF NUTRIENT DIGESTION AND UTILIZATION   Once ingested, food must be broken down into small components (digested). Then it is available for energy. It helps maintain body structure and function. Digestion begins in the mouth. It continues in the stomach and intestines, with help from the pancreas. The nutrients from digested food are absorbed from the intestines into the blood. Then they are carried to the liver. The liver prepares nutrients for:   Immediate use.   Storage and future use.   Alcohol inhibits the breakdown of nutrients into usable molecules.   It decreases secretion of digestive enzymes from the pancreas.   Alcohol impairs nutrient absorption by damaging the cells lining the stomach and intestines.   It also interferes with moving some nutrients into the blood.   In addition, nutritional deficiencies themselves may lead to further absorption problems.   For example, folate deficiency changes the cells that line the small intestine. This impairs how water is absorbed. It also affects absorbed nutrients. These  include glucose, sodium, and additional folate.   Even if nutrients are digested and absorbed, alcohol can prevent them from being fully used. It changes their transport, storage, and excretion. Impaired utilization of nutrients by alcoholics is indicated by:   Decreased liver stores of vitamins, such as vitamin A.   Increased excretion of nutrients such as fat.  ALCOHOL AND ENERGY SUPPLY   Three basic nutritional components found in food are:   Carbohydrates.   Proteins.   Fats.   These are used as energy. Some alcoholics take in as much as 50% of their total daily calories from alcohol. They often neglect important foods.   Even when enough food is eaten, alcohol can impair the ways the body controls blood sugar (glucose) levels. It may either increase or decrease blood sugar.   In non-diabetic alcoholics, increased blood sugar (hyperglycemia) is caused by poor insulin secretion. It is usually temporary.   Decreased blood sugar (hypoglycemia) can cause serious injury even if this condition is short-lived. Low blood sugar can happen when a fasting or malnourished person drinks alcohol. When there is no food to supply energy, stored sugar is used up. The products of alcohol inhibit forming glucose from other compounds such as amino acids. As a result, alcohol causes the brain and other body tissue to lack glucose. It is needed for energy and function.   Alcohol is an energy source. But how the body processes and uses the energy from alcohol is complex. Also, when alcohol is substituted for carbohydrates, subjects tend to lose weight. This indicates that they get less energy from alcohol than from food.  ALCOHOL - MAINTAINING CELL STRUCTURE AND FUNCTION  Structure Cells are made mostly of protein. So an adequate protein diet is important for maintaining cell structure. This is especially true if cells are being damaged. Research indicates that alcohol affects protein nutrition by causing  impaired:  Digestion of proteins to amino acids.   Processing of amino acids by the small intestine and liver.   Synthesis of proteins from amino acids.   Protein secretion by the liver.  Function Nutrients are essential for the body to function well. They provide the tools that the body needs to work well:   Proteins.   Vitamins.   Minerals.  Alcohol can disrupt body function. It may cause nutrient deficiencies. And it may interfere with the way nutrients are processed. Vitamins  Vitamins are essential to maintain growth and normal metabolism. They regulate many of the body`s processes. Chronic heavy drinking causes deficiencies in many vitamins. This is caused by eating less. And, in some cases, vitamins may be poorly absorbed. For example, alcohol inhibits fat absorption. It impairs how the vitamins A, E, and D are normally absorbed along with dietary fats. Not enough vitamin A may cause night blindness. Not enough vitamin D may cause softening of the bones.   Some alcoholics lack vitamins A, C, D, E, K, and the B vitamins. These are all involved in wound healing and cell maintenance. In particular, because vitamin K is necessary for blood clotting, lacking that vitamin can cause delayed clotting. The result is excess bleeding. Lacking other vitamins involved in brain function may cause severe neurological damage.  Minerals Deficiencies of minerals such as calcium, magnesium, iron, and zinc are common in alcoholics. The alcohol itself does not seem to affect how these minerals are absorbed. Rather, they seem to occur secondary to other alcohol-related problems, such as:  Less calcium absorbed.   Not enough magnesium.   More urinary excretion.   Vomiting.   Diarrhea.   Not enough iron due to gastrointestinal bleeding.   Not enough zinc or losses related to other nutrient deficiencies.   Mineral deficiencies can cause a variety of medical consequences. These range from  calcium-related bone disease to zinc-related night blindness and skin lesions.  ALCOHOL, MALNUTRITION, AND MEDICAL COMPLICATIONS  Liver Disease   Alcoholic liver damage is caused primarily by alcohol itself. But poor nutrition may increase the risk of alcohol-related liver damage. For example, nutrients normally found in the liver are known to be affected by drinking alcohol. These include carotenoids, which are the major sources of vitamin A, and vitamin E compounds. Decreases in such nutrients may play some role in alcohol-related liver damage.  Pancreatitis  Research suggests that malnutrition may increase the risk of developing alcoholic pancreatitis. Research suggests that a diet lacking in protein may increase alcohol's damaging effect on the pancreas.  Brain  Nutritional deficiencies may have severe effects on brain function. These may be permanent. Specifically, thiamine deficiencies are often seen in alcoholics. They can cause severe neurological problems. These include:   Impaired movement.   Memory loss seen in Wernicke-Korsakoff syndrome.  Pregnancy  Alcohol has toxic effects on fetal development. It causes alcohol-related birth defects. They include fetal alcohol syndrome. Alcohol itself is toxic to the fetus. Also, the nutritional deficiency can affect how the fetus develops. That may compound the risk of developmental damage.   Nutritional needs during pregnancy are 10% to 30% greater than normal. Food intake can increase by as much as 140% to cover the needs of  both mother and fetus. An alcoholic mother`s nutritional problems may adversely affect the nutrition of the fetus. And alcohol itself can also restrict nutrition flow to the fetus.  NUTRITIONAL STATUS OF ALCOHOLICS  Techniques for assessing nutritional status include:  Taking body measurements to estimate fat reserves. They include:   Weight.   Height.   Mass.   Skin fold thickness.   Performing blood analysis  to provide measurements of circulating:   Proteins.   Vitamins.   Minerals.   These techniques tend to be imprecise. For many nutrients, there is no clear "cut-off" point that would allow an accurate definition of deficiency. So assessing the nutritional status of alcoholics is limited by these techniques. Dietary status may provide information about the risk of developing nutritional problems. Dietary status is assessed by:   Taking patients' dietary histories.   Evaluating the amount and types of food they are eating.   It is difficult to determine what exact amount of alcohol begins to have damaging effects on nutrition. In general, moderate drinkers have 2 drinks or less per day. They seem to be at little risk for nutritional problems. Various medical disorders begin to appear at greater levels.   Research indicates that the majority of even the heaviest drinkers have few obvious nutritional deficiencies. Many alcoholics who are hospitalized for medical complications of their disease do have severe malnutrition. Alcoholics tend to eat poorly. Often they eat less than the amounts of food necessary to provide enough:   Carbohydrates.   Protein.   Fat.   Vitamins A and C.   B vitamins.   Minerals like calcium and iron.  Of major concern is alcohol's effect on digesting food and use of nutrients. It may shift a mildly malnourished person toward severe malnutrition. Document Released: 01/10/2005 Document Revised: 03/07/2011 Document Reviewed: 06/26/2005 [facility] Patient Information 2012 [place], [place].    [facility]- RN/PT-  Arranged with [facility]- [phone]

## 2011-09-13 NOTE — Discharge Summary (Signed)
Seen and examined.  Agree with Dr. Konrad Dolores.  OK to DC home despite PT rec for SNF for rehab.  Patient wants home with family and home PT.

## 2011-09-13 NOTE — Discharge Summary (Addendum)
Family Medicine Resident Discharge Summary  Patient ID: [name] [id number] 76 y.o. [birth year]  Admit date: 09/10/2011  Discharge date and time: 09/12/11  Admitting Physician: Sanjuana Letters, MD   Discharge Physician: Doralee Albino, MD  Admission Diagnoses: Alcohol withdrawal [291.81] Altered mental status [780.97] GERD (gastroesophageal reflux disease) [530.81] Weakness [780.79] Weakness  Discharge Diagnoses: Alcohol withdrawal, protein calori malnutrition, global weakness  Admission Condition: fair  Discharged Condition: good  Indication for Admission: Concern for withdrawal, nutritional support due to severe weakness  Hospital Course: DENSON NICCOLI is a 76 y.o. year old male w/ a h/o A. Fib, HTN, HLD, and alcohol abuse presented with weakness and a history of alcohol cessation approximately 3-4 days ago.   Alcohol: Pt w/ h/o alcohol abuse w/ several day h/o abstinance and profound weakness on admission. Pt w/ elevated liver enzymes consistent w/ alcoholic hepatitis. Pt placed on CIWA protocol and had a score of 2 throughout admission requiring no pharmacological intervention for withdrawal. Pt also given Follic acid, multivitamin, and thiamine throughout admission. Pt and care team had several discussions about the need for abstinence.   Weakness: Likely from malnutrition from chronic Alcohol consumptoion and nausea and vomiting from withdrawal. N/V improved w/ antiemetics and hydration. Pt very week and w/ poor mobility on admission. Pt worked w/ PT throughout admission who recommended inpatient rehab who refused to take pt due to no coverage through insurance. PT and OT recommended SNF but pt unable to afford, so pt set up with home health/PT. Pt strength improved significantly while in hospital w/ several days of good nutrition and PT. Pt lives across the street from his nephew and his wife and will be going home to live with them at time of DC.   Depression: Pt  w/ h/o depression at time of admission. No SI and currently depressed. Pt depression improved significantly as overall health improved. Pt to follow up with PCP concerning starting antidepressent  HLD: Pt home statin continued during admission  CV: Pt in and out of afib during admission. On Metoprolol and ASA.  No further anticoagulation due to fall risk. Pt rate remained stable throughout admission  Consults: none  Significant Diagnostic Studies:   Lab 09/11/11 0500 09/10/11 2104  WBC 2.3* 3.5*  HGB 11.0* 13.0  HCT 32.3* 37.9*  PLT 98* 130*    Lab 09/11/11 0830 09/11/11 0500 09/10/11 2104  NA -- 137 138  K -- 3.3* 3.6  CL -- 96 94*  CO2 -- 22 18*  BUN -- 10 8  CREATININE -- 0.73 0.67  CALCIUM -- 7.6* 8.3*  PROT 5.2* -- 6.4  BILITOT 2.6* -- 2.3*  ALKPHOS 63 -- 71  ALT 42 -- 57*  AST 76* -- 119*  GLUCOSE -- 76 76   Urinalysis    Component Value Date/Time   COLORURINE AMBER* 09/10/2011 2144   APPEARANCEUR CLEAR 09/10/2011 2144   LABSPEC 1.015 09/10/2011 2144   PHURINE 6.0 09/10/2011 2144   GLUCOSEU NEGATIVE 09/10/2011 2144   HGBUR NEGATIVE 09/10/2011 2144   BILIRUBINUR SMALL* 09/10/2011 2144   KETONESUR 40* 09/10/2011 2144   PROTEINUR NEGATIVE 09/10/2011 2144   UROBILINOGEN 1.0 09/10/2011 2144   NITRITE NEGATIVE 09/10/2011 2144   LEUKOCYTESUR NEGATIVE 09/10/2011 2144    Ammonia: 45  Sed Rate:  Total CK: 60 Vit D: 20 INR: 1.11 Troponin <30  CXR: Mild cardiomegaly, without acute disease.  EKG: ATRIAL FIBRILLATION, V-RATE 77-132 ~ var'd rate, irreg atrial activity Left bundle branch block BASELINE WANDER  IN LEAD(S) III,aVF Abnormal ECG No significant change since last tracing  Discharge Exam: Gen: NAD, frail appearing  HEENT: MMM  CV: RRR  Res: normal effort  Ext/Musc:2+ pulses, slight tremor w/ hands extended (improved past baseline per pt)  Neuro: CN grossly intact. Movement of extremities symmetrical  Disposition: Home with nephew and wife and with home PT     Patient Instructions:  Medication List  As of 09/13/2011 11:56 AM   TAKE these medications         aspirin EC 81 MG tablet   Take 81 mg by mouth daily.      cholecalciferol 1000 UNITS tablet   Commonly known as: VITAMIN D   Take 1 tablet (1,000 Units total) by mouth daily.      Fish Oil 1200 MG Caps   Take 1 capsule by mouth daily.      lisinopril 5 MG tablet   Commonly known as: PRINIVIL,ZESTRIL   Take 1 tablet (5 mg total) by mouth daily.      metoprolol 50 MG tablet   Commonly known as: LOPRESSOR   Take 75 mg by mouth 2 (two) times daily.      multivitamin with minerals Tabs   Take 1 tablet by mouth daily.      nitroGLYCERIN 0.4 MG SL tablet   Commonly known as: NITROSTAT   Place 0.4 mg under the tongue every 5 (five) minutes x 3 doses as needed. For chest pain.      omeprazole 20 MG capsule   Commonly known as: PRILOSEC   Take 1 capsule (20 mg total) by mouth daily.      ondansetron 8 MG tablet   Commonly known as: ZOFRAN   Take 1 tablet (8 mg total) by mouth every 6 (six) hours as needed for nausea.      simvastatin 20 MG tablet   Commonly known as: ZOCOR   Take 1 tablet (20 mg total) by mouth at bedtime.      Tamsulosin HCl 0.4 MG Caps   Commonly known as: FLOMAX   Take 1 capsule (0.4 mg total) by mouth daily.      traMADol 50 MG tablet   Commonly known as: ULTRAM   Take 50 mg by mouth every 8 (eight) hours as needed. For pain            Activity: activity as tolerated Diet: regular diet Wound Care: none needed  Follow-up Items: 1. Alcohol abuse 2. Malnutrition 3. Home PT 4. Low Vitamin D  Signed: Unita Detamore, MD Family Medicine Resident PGY-1 [phone] 09/13/2011 11:56 AM

## 2011-09-13 NOTE — Progress Notes (Signed)
Pt discharged to home per MD order. Pt and niece received all discharge instructions and medication information including follow-up appointment and prescription information.  Pt alert and oriented at discharge with no complaints of pain. [name]

## 2011-09-15 LAB — VITAMIN D 1,25 DIHYDROXY
Vitamin D2 1, 25 (OH)2: <8 pg/mL
Vitamin D3 1, 25 (OH)2: 54 pg/mL

## 2011-09-19 ENCOUNTER — Encounter: Payer: Self-pay | Admitting: Family Medicine

## 2011-09-19 ENCOUNTER — Ambulatory Visit (INDEPENDENT_AMBULATORY_CARE_PROVIDER_SITE_OTHER): Payer: Medicare Other | Admitting: Family Medicine

## 2011-09-19 VITALS — BP 137/80 | HR 94 | Ht 71.0 in | Wt 181.0 lb

## 2011-09-19 DIAGNOSIS — F1010 Alcohol abuse, uncomplicated: Secondary | ICD-10-CM

## 2011-09-19 DIAGNOSIS — E559 Vitamin D deficiency, unspecified: Secondary | ICD-10-CM

## 2011-09-19 DIAGNOSIS — I10 Essential (primary) hypertension: Secondary | ICD-10-CM

## 2011-09-19 DIAGNOSIS — K219 Gastro-esophageal reflux disease without esophagitis: Secondary | ICD-10-CM

## 2011-09-19 DIAGNOSIS — R5381 Other malaise: Secondary | ICD-10-CM

## 2011-09-19 MED ORDER — VITAMIN D3 25 MCG (1000 UNIT) PO TABS: 2000.0000 [IU] | ORAL_TABLET | Freq: Every day | ORAL | Status: DC

## 2011-09-19 MED ORDER — HYDROCHLOROTHIAZIDE 25 MG PO TABS: 25.0000 mg | ORAL_TABLET | Freq: Every day | ORAL | Status: DC

## 2011-09-19 MED ORDER — METOPROLOL TARTRATE 50 MG PO TABS: 75.0000 mg | ORAL_TABLET | Freq: Two times a day (BID) | ORAL | Status: DC

## 2011-09-19 MED ORDER — OMEPRAZOLE 20 MG PO CPDR: 20.0000 mg | DELAYED_RELEASE_CAPSULE | Freq: Every day | ORAL | Status: DC

## 2011-09-19 NOTE — Patient Instructions (Signed)
Dear [name],   Thank you for coming to clinic today. Please read below regarding the issues that we discussed.   1. Alcohol Abuse - We are glad that you are committed to quitting. We would like to do everything we can to support you.   2. High Blood Pressure - We have added one medication called hydrochlorothiazide or HCTZ for short. Take this once a day.   3. Low Vitamin D - You need to take 2000 units a day for the rest of your life. This will help protect your bones.  4. Exercising - Continue working out with the home therapist. Stand up and sit down from a chair at least 8 times in a row. Practice several times a day. Work your way up to doing it without your arms.   Please follow up in clinic in 2 weeks . Please call earlier if you have any questions or concerns.   Sincerely,   Dr. Clinton Sawyer and Dr. Sheffield Slider

## 2011-09-19 NOTE — Assessment & Plan Note (Signed)
Patient with desire to quit drinking. Plans to do so cold-turkey. Does not plan to attend AA as he has been to "thousands" of meeting and doesn't want to try medication. Will f/u in 2 weeks.

## 2011-09-19 NOTE — Assessment & Plan Note (Signed)
Patient will continue to work with home PT. Also encouraged to practice getting up and down out of a chair 8 times in a row and work up until he can do it without using his arms.

## 2011-09-19 NOTE — Assessment & Plan Note (Signed)
Will increase Vit D to 2000 IU daily.

## 2011-09-19 NOTE — Assessment & Plan Note (Signed)
Continue metoprolol 75 mg BID. Add HCTZ 25 mg daily to help with edema and BP.

## 2011-09-19 NOTE — Progress Notes (Signed)
  Subjective:    Patient ID: [name], male    DOB: 26-Oct-1935, 76 y.o.   MRN: [id number]  HPI  1. Alcohol Abuse - > 50 year history, hospitalized June 2013 for transaminitis, has not had alcohol since discharge on 6/11; previously sporadic beer drinker up to 6 beers a day based upon mood; currently expresses the desire to   2. Weakness - persistent, had a fall in his house and struck his head several months ago, currently uses a straight walking stick   3. Memory Loss - has been concerned about memory loss over time; forgets his grocery list; last mini-mental exam in March 2013 and scored 27/30 so not to be repeated again today   4. Social - Currently living with nephew and nephew's wife who is preparing his meals and medications  Review of Systems Positive for urinary incontinence (urge type) - going to see urologist Dr. Annabell Howells Negative unless stated above     Objective:   Physical Exam BP 137/80  Pulse 94  Ht 5\' 11"  (1.803 m)  Wt 181 lb (82.101 kg)  BMI 25.24 kg/m2 Gen: Elderly white male, pleasant, non ill appearing CV: irregularly irergular Pulm: CTA-B, normal work of breathing Abd: soft, non-edematous, NABS, no ascites  Back: 2+ pitting edema of sacrum Extremities: 3+ pitting edema of the ankles Feet: ingrown toenail of right great toe   Mini Mobility Test Sit to Stand: abnormal, cannot stand with arms crossed Immediate Standing Balance: normal Continued Standing Balance: normal Romberg: normal  Tandem Stance: abnormal  Lean Back: normal Neck Rotation: normal Walking Stance: abnormal, wide-based gait Step Symmetry: abnormal, slight asymetry of right vs left  Step Continuity: normal Path Deviation: normal Trunk Movement: normal Turning: normal Walk Fast: normal Sit Down: abnormal Turning: normal      Assessment & Plan:  76 y.o.WM with history of alcoholism and recent hospitalization for alcoholic transaminitis who demonstrated signs and symptoms of  protein malnutrition and generalized deconditioning.

## 2011-09-25 ENCOUNTER — Other Ambulatory Visit: Payer: Self-pay | Admitting: Family Medicine

## 2011-10-08 ENCOUNTER — Ambulatory Visit (INDEPENDENT_AMBULATORY_CARE_PROVIDER_SITE_OTHER): Payer: Medicare Other | Admitting: Family Medicine

## 2011-10-08 ENCOUNTER — Encounter: Payer: Self-pay | Admitting: Family Medicine

## 2011-10-08 VITALS — BP 154/74 | HR 88 | Ht 71.0 in | Wt 175.9 lb

## 2011-10-08 DIAGNOSIS — M545 Low back pain, unspecified: Secondary | ICD-10-CM

## 2011-10-08 DIAGNOSIS — I10 Essential (primary) hypertension: Secondary | ICD-10-CM

## 2011-10-08 DIAGNOSIS — I4891 Unspecified atrial fibrillation: Secondary | ICD-10-CM

## 2011-10-08 DIAGNOSIS — R5383 Other fatigue: Secondary | ICD-10-CM

## 2011-10-08 DIAGNOSIS — R5381 Other malaise: Secondary | ICD-10-CM

## 2011-10-08 DIAGNOSIS — F1010 Alcohol abuse, uncomplicated: Secondary | ICD-10-CM

## 2011-10-08 NOTE — Assessment & Plan Note (Signed)
I instructed him in doing standing from sitting exercises with a goal of 6 reps not using his arms daily  Malnutrition likely is contributing to his weakness, so I ordered a CMET and encouraged protein intake.

## 2011-10-08 NOTE — Patient Instructions (Addendum)
Take your medicines the day of your office visits so I can see the effects  Do standing up exercises 6 times in a row each day  Call Dr Sheffield Slider if you need the HCTZ refilled.   Please return to see Dr Sheffield Slider in 1 month

## 2011-10-08 NOTE — Assessment & Plan Note (Signed)
Rate controlled 

## 2011-10-08 NOTE — Assessment & Plan Note (Signed)
His tremor suggests that he's drinking more than he admits.

## 2011-10-08 NOTE — Progress Notes (Signed)
  Subjective:    Patient ID: [name], male    DOB: 25-Jan-1936, 76 y.o.   MRN: [id number]  HPI He's moved back to his trailer and is living alone. Admits to a couple beers before bed. No breakfast today. Gatorade for lunch. Chicken salad last night for dinner.   Hypertension - got up at 10 AM and didn't take his morning medication.Thinks he's taking his HCTZ, but it isn't in his bag.   Weakness - still present. Not doing any exercises  AF - No chest pain or palpitations. No dyspnea  Gait - no recent falls, but does feel unsteady and tremulous.  Review of Systems     Objective:   Physical Exam  Cardiovascular: Normal rate.   Murmur heard.      Irregularly irregular  Pulmonary/Chest: Effort normal and breath sounds normal. He has no wheezes. He has no rales.  Abdominal: Soft. He exhibits no distension and no mass. There is no tenderness. There is no rebound.  Musculoskeletal: He exhibits edema.       Limited range of motion of back due to pain  1+ ankle edema  Neurological: He is alert. No cranial nerve deficit.       Very tremulous, but no asterexis Gait wide based, but Romberg is fairly steady  Psychiatric:       Subdued, depressed appearing          Assessment & Plan:

## 2011-10-08 NOTE — Assessment & Plan Note (Signed)
Not well controlled, but didn't take his meds today yet

## 2011-10-08 NOTE — Assessment & Plan Note (Signed)
Taking one Tramadol daily

## 2011-10-22 ENCOUNTER — Other Ambulatory Visit: Payer: Self-pay | Admitting: Family Medicine

## 2011-11-06 ENCOUNTER — Encounter (INDEPENDENT_AMBULATORY_CARE_PROVIDER_SITE_OTHER): Payer: Medicare Other | Admitting: Family Medicine

## 2011-11-06 DIAGNOSIS — I10 Essential (primary) hypertension: Secondary | ICD-10-CM

## 2011-11-06 NOTE — Progress Notes (Signed)
  Subjective:    Patient ID: [name], male    DOB: [birth year], 76 y.o.   MRN: [id number]  HPI    Review of Systems     Objective:   Physical Exam        Assessment & Plan:  485 certification form reconciled, signed and put in mail to [facility]

## 2011-11-12 ENCOUNTER — Emergency Department (HOSPITAL_COMMUNITY): Payer: Medicare Other

## 2011-11-12 ENCOUNTER — Inpatient Hospital Stay (HOSPITAL_COMMUNITY)
Admission: EM | Admit: 2011-11-12 | Discharge: 2011-11-14 | DRG: 641 | Disposition: A | Discharge status: Home / Self Care | Payer: Medicare Other | Attending: Family Medicine | Admitting: Family Medicine

## 2011-11-12 DIAGNOSIS — I252 Old myocardial infarction: Secondary | ICD-10-CM

## 2011-11-12 DIAGNOSIS — I509 Heart failure, unspecified: Secondary | ICD-10-CM | POA: Diagnosis present

## 2011-11-12 DIAGNOSIS — K5900 Constipation, unspecified: Secondary | ICD-10-CM | POA: Diagnosis present

## 2011-11-12 DIAGNOSIS — I10 Essential (primary) hypertension: Secondary | ICD-10-CM | POA: Diagnosis present

## 2011-11-12 DIAGNOSIS — E781 Pure hyperglyceridemia: Secondary | ICD-10-CM

## 2011-11-12 DIAGNOSIS — I5032 Chronic diastolic (congestive) heart failure: Secondary | ICD-10-CM | POA: Diagnosis present

## 2011-11-12 DIAGNOSIS — E871 Hypo-osmolality and hyponatremia: Principal | ICD-10-CM | POA: Diagnosis present

## 2011-11-12 DIAGNOSIS — Z7982 Long term (current) use of aspirin: Secondary | ICD-10-CM

## 2011-11-12 DIAGNOSIS — Z87891 Personal history of nicotine dependence: Secondary | ICD-10-CM

## 2011-11-12 DIAGNOSIS — W19XXXA Unspecified fall, initial encounter: Secondary | ICD-10-CM | POA: Diagnosis present

## 2011-11-12 DIAGNOSIS — F1010 Alcohol abuse, uncomplicated: Secondary | ICD-10-CM | POA: Diagnosis present

## 2011-11-12 DIAGNOSIS — IMO0002 Reserved for concepts with insufficient information to code with codable children: Secondary | ICD-10-CM

## 2011-11-12 DIAGNOSIS — Z8546 Personal history of malignant neoplasm of prostate: Secondary | ICD-10-CM

## 2011-11-12 DIAGNOSIS — F329 Major depressive disorder, single episode, unspecified: Secondary | ICD-10-CM | POA: Diagnosis present

## 2011-11-12 DIAGNOSIS — F3289 Other specified depressive episodes: Secondary | ICD-10-CM | POA: Diagnosis present

## 2011-11-12 DIAGNOSIS — N400 Enlarged prostate without lower urinary tract symptoms: Secondary | ICD-10-CM | POA: Diagnosis present

## 2011-11-12 DIAGNOSIS — I2510 Atherosclerotic heart disease of native coronary artery without angina pectoris: Secondary | ICD-10-CM | POA: Diagnosis present

## 2011-11-12 DIAGNOSIS — I4891 Unspecified atrial fibrillation: Secondary | ICD-10-CM | POA: Diagnosis present

## 2011-11-12 DIAGNOSIS — E876 Hypokalemia: Secondary | ICD-10-CM | POA: Diagnosis present

## 2011-11-12 HISTORY — DX: Essential (primary) hypertension: I10

## 2011-11-12 HISTORY — DX: Anemia, unspecified: D64.9

## 2011-11-12 LAB — CBC WITH DIFFERENTIAL/PLATELET
Basophils Relative: 0 % (ref 0–1)
HCT: 31.3 % — ABNORMAL LOW (ref 39.0–52.0)
Hemoglobin: 11.3 g/dL — ABNORMAL LOW (ref 13.0–17.0)
Lymphocytes Relative: 39 % (ref 12–46)
Lymphs Abs: 1.8 10*3/uL (ref 0.7–4.0)
MCHC: 36.1 g/dL — ABNORMAL HIGH (ref 30.0–36.0)
Monocytes Absolute: 0.4 10*3/uL (ref 0.1–1.0)
Monocytes Relative: 9 % (ref 3–12)
Neutro Abs: 2.3 10*3/uL (ref 1.7–7.7)
Neutrophils Relative %: 50 % (ref 43–77)
RBC: 3.33 MIL/uL — ABNORMAL LOW (ref 4.22–5.81)
WBC: 4.6 10*3/uL (ref 4.0–10.5)

## 2011-11-12 LAB — COMPREHENSIVE METABOLIC PANEL
Albumin: 3.1 g/dL — ABNORMAL LOW (ref 3.5–5.2)
Alkaline Phosphatase: 64 U/L (ref 39–117)
BUN: 10 mg/dL (ref 6–23)
CO2: 23 mEq/L (ref 19–32)
Chloride: 86 mEq/L — ABNORMAL LOW (ref 96–112)
Creatinine, Ser: 0.89 mg/dL (ref 0.50–1.35)
GFR calc non Af Amer: 83 mL/min — ABNORMAL LOW (ref >90)
Glucose, Bld: 89 mg/dL (ref 70–99)
Potassium: 2.9 mEq/L — ABNORMAL LOW (ref 3.5–5.1)
Total Bilirubin: 0.9 mg/dL (ref 0.3–1.2)

## 2011-11-12 LAB — URINALYSIS, ROUTINE W REFLEX MICROSCOPIC
Bilirubin Urine: NEGATIVE
Glucose, UA: NEGATIVE mg/dL
Ketones, ur: NEGATIVE mg/dL
Leukocytes, UA: NEGATIVE
pH: 5.5 (ref 5.0–8.0)

## 2011-11-12 LAB — ETHANOL: Alcohol, Ethyl (B): 302 mg/dL — ABNORMAL HIGH (ref 0–11)

## 2011-11-12 LAB — GLUCOSE, CAPILLARY: Glucose-Capillary: 88 mg/dL (ref 70–99)

## 2011-11-12 MED ORDER — POTASSIUM CHLORIDE CRYS ER 20 MEQ PO TBCR
40.0000 meq | EXTENDED_RELEASE_TABLET | Freq: Two times a day (BID) | ORAL | Status: DC
  Administered 2011-11-12 – 2011-11-14 (×4): 40 meq via ORAL
  Filled 2011-11-12 (×7): qty 2

## 2011-11-12 MED ORDER — ADULT MULTIVITAMIN W/MINERALS CH
1.0000 | ORAL_TABLET | Freq: Every day | ORAL | Status: DC
  Administered 2011-11-13 – 2011-11-14 (×2): 1 via ORAL
  Filled 2011-11-12 (×2): qty 1

## 2011-11-12 MED ORDER — VITAMIN B-1 100 MG PO TABS
100.0000 mg | ORAL_TABLET | Freq: Every day | ORAL | Status: DC
  Administered 2011-11-13 – 2011-11-14 (×2): 100 mg via ORAL
  Filled 2011-11-12 (×2): qty 1

## 2011-11-12 MED ORDER — LORAZEPAM 2 MG/ML IJ SOLN: 0.0000 mg | Freq: Two times a day (BID) | INTRAMUSCULAR | Status: DC

## 2011-11-12 MED ORDER — THIAMINE HCL 100 MG/ML IJ SOLN
100.0000 mg | Freq: Every day | INTRAMUSCULAR | Status: DC
  Filled 2011-11-12 (×2): qty 1

## 2011-11-12 MED ORDER — FOLIC ACID 1 MG PO TABS
1.0000 mg | ORAL_TABLET | Freq: Every day | ORAL | Status: DC
  Administered 2011-11-13 – 2011-11-14 (×2): 1 mg via ORAL
  Filled 2011-11-12 (×2): qty 1

## 2011-11-12 MED ORDER — SODIUM CHLORIDE 0.9 % IV SOLN
Freq: Once | INTRAVENOUS | Status: AC
  Administered 2011-11-12: 21:00:00 via INTRAVENOUS

## 2011-11-12 MED ORDER — LORAZEPAM 2 MG/ML IJ SOLN: 1.0000 mg | Freq: Four times a day (QID) | INTRAMUSCULAR | Status: DC | PRN

## 2011-11-12 MED ORDER — LORAZEPAM 2 MG/ML IJ SOLN: 0.0000 mg | Freq: Four times a day (QID) | INTRAMUSCULAR | Status: DC

## 2011-11-12 MED ORDER — LORAZEPAM 1 MG PO TABS: 1.0000 mg | ORAL_TABLET | Freq: Four times a day (QID) | ORAL | Status: DC | PRN

## 2011-11-12 NOTE — ED Notes (Signed)
Pt reportedly passed out at home while c/o chest pain. Found by EMS near the front doorway. A&Ox4 with ETOH on board. LBB noted on ECG rate between 40s-50s. SBP 90s per EMS. CBG 137. 18L hand.
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
## 2011-11-12 NOTE — ED Notes (Signed)
PT. REMOVED HIS C- COLLAR , URINAL GIVEN TO PT.

## 2011-11-12 NOTE — ED Notes (Signed)
LSB REMOVED WITH ASSISTANCE OF 2 NT. C- COLLAR INTACT.

## 2011-11-12 NOTE — ED Notes (Signed)
REPORT GIVEN TO 3 WEST NURSE , TRANSPORTED IN STABLE CONDITION WITH NO PAIN OR DISCOMFORT , RESPIRATIONS UNLABORED , IV SITE UNREMARKABLE.

## 2011-11-13 ENCOUNTER — Inpatient Hospital Stay (HOSPITAL_COMMUNITY): Payer: Medicare Other

## 2011-11-13 ENCOUNTER — Encounter (HOSPITAL_COMMUNITY): Payer: Self-pay | Admitting: *Deleted

## 2011-11-13 DIAGNOSIS — F1010 Alcohol abuse, uncomplicated: Secondary | ICD-10-CM

## 2011-11-13 DIAGNOSIS — F05 Delirium due to known physiological condition: Secondary | ICD-10-CM

## 2011-11-13 DIAGNOSIS — Z9181 History of falling: Secondary | ICD-10-CM

## 2011-11-13 LAB — CBC
HCT: 33 % — ABNORMAL LOW (ref 39.0–52.0)
Hemoglobin: 11.8 g/dL — ABNORMAL LOW (ref 13.0–17.0)
MCH: 33.5 pg (ref 26.0–34.0)
MCHC: 35.8 g/dL (ref 30.0–36.0)
MCV: 93.8 fL (ref 78.0–100.0)
RDW: 13.2 % (ref 11.5–15.5)

## 2011-11-13 LAB — CARDIAC PANEL(CRET KIN+CKTOT+MB+TROPI)
CK, MB: 2.4 ng/mL (ref 0.3–4.0)
Relative Index: INVALID (ref 0.0–2.5)
Troponin I: <0.3 ng/mL (ref <0.30)
Troponin I: <0.3 ng/mL (ref <0.30)
Troponin I: <0.3 ng/mL (ref <0.30)

## 2011-11-13 LAB — COMPREHENSIVE METABOLIC PANEL
ALT: 15 U/L (ref 0–53)
AST: 34 U/L (ref 0–37)
Albumin: 3.3 g/dL — ABNORMAL LOW (ref 3.5–5.2)
Alkaline Phosphatase: 61 U/L (ref 39–117)
Calcium: 9.1 mg/dL (ref 8.4–10.5)
Glucose, Bld: 74 mg/dL (ref 70–99)
Potassium: 3.7 mEq/L (ref 3.5–5.1)
Sodium: 135 mEq/L (ref 135–145)
Total Protein: 6.2 g/dL (ref 6.0–8.3)

## 2011-11-13 LAB — BASIC METABOLIC PANEL
CO2: 28 mEq/L (ref 19–32)
Calcium: 8.9 mg/dL (ref 8.4–10.5)
GFR calc Af Amer: >90 mL/min (ref >90)
GFR calc non Af Amer: 84 mL/min — ABNORMAL LOW (ref >90)
Sodium: 131 mEq/L — ABNORMAL LOW (ref 135–145)

## 2011-11-13 LAB — MAGNESIUM: Magnesium: 1.5 mg/dL (ref 1.5–2.5)

## 2011-11-13 LAB — PRO B NATRIURETIC PEPTIDE: Pro B Natriuretic peptide (BNP): 754.1 pg/mL — ABNORMAL HIGH (ref 0–125)

## 2011-11-13 MED ORDER — TRAMADOL HCL 50 MG PO TABS
50.0000 mg | ORAL_TABLET | Freq: Two times a day (BID) | ORAL | Status: DC | PRN
  Administered 2011-11-13 – 2011-11-14 (×2): 50 mg via ORAL
  Filled 2011-11-13 (×2): qty 1

## 2011-11-13 MED ORDER — POTASSIUM CHLORIDE CRYS ER 20 MEQ PO TBCR
20.0000 meq | EXTENDED_RELEASE_TABLET | ORAL | Status: AC
  Administered 2011-11-13 (×2): 20 meq via ORAL
  Filled 2011-11-13: qty 1

## 2011-11-13 MED ORDER — ASPIRIN EC 81 MG PO TBEC
81.0000 mg | DELAYED_RELEASE_TABLET | Freq: Every day | ORAL | Status: DC
  Administered 2011-11-13 – 2011-11-14 (×2): 81 mg via ORAL
  Filled 2011-11-13 (×2): qty 1

## 2011-11-13 MED ORDER — ONDANSETRON HCL 4 MG/2ML IJ SOLN: 4.0000 mg | Freq: Four times a day (QID) | INTRAMUSCULAR | Status: DC | PRN

## 2011-11-13 MED ORDER — LISINOPRIL 5 MG PO TABS
5.0000 mg | ORAL_TABLET | Freq: Every day | ORAL | Status: DC
  Administered 2011-11-13 – 2011-11-14 (×2): 5 mg via ORAL
  Filled 2011-11-13 (×2): qty 1

## 2011-11-13 MED ORDER — SIMVASTATIN 20 MG PO TABS
20.0000 mg | ORAL_TABLET | Freq: Every day | ORAL | Status: DC
  Administered 2011-11-13: 20 mg via ORAL
  Filled 2011-11-13 (×2): qty 1

## 2011-11-13 MED ORDER — ONDANSETRON HCL 4 MG PO TABS: 4.0000 mg | ORAL_TABLET | Freq: Four times a day (QID) | ORAL | Status: DC | PRN

## 2011-11-13 MED ORDER — SODIUM CHLORIDE 0.9 % IJ SOLN
3.0000 mL | Freq: Two times a day (BID) | INTRAMUSCULAR | Status: DC
  Administered 2011-11-13 – 2011-11-14 (×3): 3 mL via INTRAVENOUS

## 2011-11-13 MED ORDER — PANTOPRAZOLE SODIUM 40 MG PO TBEC
40.0000 mg | DELAYED_RELEASE_TABLET | Freq: Every day | ORAL | Status: DC
  Administered 2011-11-13 – 2011-11-14 (×2): 40 mg via ORAL
  Filled 2011-11-13 (×2): qty 1

## 2011-11-13 NOTE — Evaluation (Signed)
Physical Therapy Evaluation Patient Details Name: [name] MRN: [id number] DOB: Jul 02, 1935 Today's Date: 11/13/2011 Time: 8413-2440 PT Time Calculation (min): 28 min  PT Assessment / Plan / Recommendation Clinical Impression  pt admitted to ED intoxicated and after supposed fall.  On eval, pt was tremulous as usual, but was weak, mildly ataxic and unstable with gait.  Pt not confident on his feet as before this drinking event and does not feel able to handle himself at home at his specific time, but wants to get home soon.  Will see on acute for gait and balance training    PT Assessment  Patient needs continued PT services    Follow Up Recommendations  Home health PT;Other (comment) (home safety eval and treat if needed)    Barriers to Discharge Decreased caregiver support      Equipment Recommendations  Other (comment) (TBD)    Recommendations for Other Services     Frequency Min 3X/week    Precautions / Restrictions Precautions Precautions: Fall   Pertinent Vitals/Pain       Mobility  Bed Mobility Bed Mobility: Sit to Supine Sit to Supine: 5: Supervision Transfers Transfers: Sit to Stand;Stand to Sit Sit to Stand: 4: Min guard;From chair/3-in-1;With upper extremity assist Stand to Sit: 4: Min guard;With upper extremity assist;To bed;To chair/3-in-1 Details for Transfer Assistance: reinforced safety and hand placement Ambulation/Gait Ambulation/Gait Assistance: 4: Min assist Ambulation Distance (Feet): 60 Feet Assistive device: 1 person hand held assist Ambulation/Gait Assistance Details: gait weak and mildly tremulous and ataxic.  Pt using hand hold assist to steady Gait Pattern: Step-through pattern;Ataxic;Wide base of support Stairs: No Wheelchair Mobility Wheelchair Mobility: No    Exercises     PT Diagnosis: Difficulty walking;Generalized weakness  PT Problem List: Decreased strength;Decreased balance;Decreased mobility;Decreased  coordination;Decreased knowledge of use of DME PT Treatment Interventions: DME instruction;Gait training;Stair training;Functional mobility training;Therapeutic activities;Balance training;Patient/family education   PT Goals Acute Rehab PT Goals PT Goal Formulation: With patient Time For Goal Achievement: 11/13/11 Potential to Achieve Goals: Good Pt will go Supine/Side to Sit: with modified independence;with HOB 0 degrees PT Goal: Supine/Side to Sit - Progress: Goal set today Pt will go Sit to Stand: with modified independence PT Goal: Sit to Stand - Progress: Goal set today Pt will Transfer Bed to Chair/Chair to Bed: with modified independence PT Transfer Goal: Bed to Chair/Chair to Bed - Progress: Goal set today Pt will Ambulate: 51 - 150 feet;with modified independence;with least restrictive assistive device PT Goal: Ambulate - Progress: Goal set today  Visit Information  Last PT Received On: 11/13/11 Assistance Needed: +1    Subjective Data  Subjective: These tremors are typical, but I usually feel confident walking. Patient Stated Goal: Ultimately get home, feel confident when I walk   Prior Functioning  Home Living Lives With: Alone Available Help at Discharge: Other (Comment) (Nephew/his wife can help PRN/pt could stay with them ST) Type of Home: House Home Access: Stairs to enter [employer] of Steps: 1 Entrance Stairs-Rails: None Home Layout: One level Bathroom Shower/Tub: Engineer, manufacturing systems: Standard Home Adaptive Equipment:  (Walking stick) Prior Function Level of Independence: Independent;Independent with assistive device(s) Able to Take Stairs?: Yes Driving: Yes Communication Communication: No difficulties    Cognition  Overall Cognitive Status: Appears within functional limits for tasks assessed/performed Arousal/Alertness: Awake/alert Orientation Level: Appears intact for tasks assessed Behavior During Session: [facility] for tasks  performed    Extremity/Trunk Assessment Right Lower Extremity Assessment RLE ROM/Strength/Tone: [facility] for tasks  assessed;Deficits RLE ROM/Strength/Tone Deficits: generally weak and tremulous with intention Left Lower Extremity Assessment LLE ROM/Strength/Tone: WFL for tasks assessed;Deficits LLE ROM/Strength/Tone Deficits: generally weak and tremulous with intention   Balance Balance Balance Assessed: Yes Static Sitting Balance Static Sitting - Balance Support: No upper extremity supported;Feet supported Static Sitting - Level of Assistance: 5: Stand by assistance Static Standing Balance Static Standing - Balance Support: Bilateral upper extremity supported;During functional activity Static Standing - Level of Assistance: 5: Stand by assistance Static Standing - Comment/# of Minutes: standing for pericare x2  holding to rails and BSC Dynamic Standing Balance Dynamic Standing - Balance Support: Left upper extremity supported;Right upper extremity supported;During functional activity Dynamic Standing - Level of Assistance: 4: Min assist;3: Mod assist  End of Session PT - End of Session Activity Tolerance: Patient tolerated treatment well Patient left: in bed;with call bell/phone within reach Nurse Communication: Mobility status  GP     Cyla Haluska, Eliseo Gum 11/13/2011, 2:12 PM  11/13/2011   Bing, PT [phone] (pager)

## 2011-11-13 NOTE — H&P (Signed)
Seen and examined.  Discussed with Dr. Cristal Ford.  Agree with her management and documentation.  Briefly, 76 yo male with known alcoholism presents with acute intoxication and fall.  Briefly. 1.  He clearly tripped with his fall (plus intoxication).  No evident injury.  Will need PT to evaluate.  Would not do further workup unless PT eval suggests. 2. EtOHism. Regularly drinks beer.  Yesterday, got into the hard stuff.  States he cannot remember the last time he drank liquor.  Seems calm today.  I am uncertain about his risk for withdrawal.  CIWA protocol while inpatient is prudent.  Offer EtOH cessation therapy - he does not seem interested. 3. Uncertain home situation.  The continued drinking and fall suggests less than ideal.  Discuss prior to DC. DC depends on PT eval, lack of emergence of withdrawal symptoms, adequate PO intake and correction of metabolic abnormalities.

## 2011-11-13 NOTE — Clinical Documentation Improvement (Signed)
CHF DOCUMENTATION CLARIFICATION QUERY  THIS DOCUMENT IS NOT A PERMANENT PART OF THE MEDICAL RECORD  TO RESPOND TO THE THIS QUERY, FOLLOW THE INSTRUCTIONS BELOW:  1. If needed, update documentation for the patient's encounter via the notes activity.  2. Access this query again and click edit on the In [employer].  3. After updating, or not, click F2 to complete all highlighted (required) fields concerning your review. Select "additional documentation in the medical record" OR "no additional documentation provided".  4. Click Sign note button.  5. The deficiency will fall out of your In Basket *Please let us know if you are not able to complete this workflow by phone or e-mail (listed below).  Please update your documentation within the medical record to reflect your response to this query.                                                                                    11/13/11  Dear Dr. Pollie Meyer / Associates,  In a better effort to capture your patient's severity of illness, reflect appropriate length of stay and utilization of resources, a review of the patient medical record has revealed the following indicators the diagnosis of Heart Failure.    Possible Clinical Conditions? . mild decompensation Diastolic Congestive Heart Failure . Acute  on Chronic Diastolic Congestive Heart Failure . Chronic Diastolic Congestive Heart Failure . Other Condition . Cannot Clinically Determine  Supporting Information: - Risk Factors: Hx Chronic Diastolic CHF, Chronic alcohol abuse, acute intoxication, "differential diagnosis includes CHF and cirrhosis", CAD s/p MI x2, Afib, HTN - Signs & Symptoms: volume overload, 2+ pitting edema bilateral to upper shins, SOB - BNP: 754 on 8/13 - Radiology: Moderate cardiomegaly - Treatment: NS @ 100cc/hr in ED reduced to KVO, Hold HCTZ, daily weights, I&O, KCL PO x1 in ED then po daily, CXR  Reviewed: additional documentation in the medical  record  Thank You,  Beverley Fiedler RN Clinical Documentation Specialist: Pager: [phone] Health Information Management:  [phone] [facility]

## 2011-11-13 NOTE — Progress Notes (Signed)
Clinical Social Work Department BRIEF PSYCHOSOCIAL ASSESSMENT 11/13/2011  Patient:  [name], [name]     Account Number:  [id number]     Admit date:  11/12/2011  Clinical Social Worker:  Lourdes Sledge  Date/Time:  11/13/2011 10:37 AM  Referred by:  Physician  Date Referred:  11/13/2011 Referred for  Substance Abuse   Other Referral:   Interview type:  Patient Other interview type:    PSYCHOSOCIAL DATA Living Status:  ALONE Admitted from facility:   Level of care:   Primary support name:  Cindi Carbon [phone] Primary support relationship to patient:  FAMILY Degree of support available:   Pt receives support from his nephew Cindi Carbon [phone] who according to pt lives down the street from pt.    CURRENT CONCERNS Current Concerns  Substance Abuse   Other Concerns:   Possible placement    SOCIAL WORK ASSESSMENT / PLAN CSW received a referral for substance abuse.    CSW is familiar with pt who was admitted into the hospital 09/10/11-09/13/11 for weakness and a history of alcohol abuse. CSW assessed pt for substance abuse on last admission however pt was not interested in any resources. Pt dc'ed to his nephews home where he could receive 24hr care and a few weeks later returned to his own home.    CSW re-introduced herself, role and purpose for the visit. CSW explored pt current substance abuse, desire for change and whether pt would be interested in SNF placement if recommended by PT.    Pt stated he did not remember CSW, believed he did have an alocohol problem however was not interested in resources or inpatient treatment. CSW explored what pt drank on the day of admission and pt stated he had "a pint of wisky." Pt stated he drinks beer everyday but on the day of admission he "had the hard stuff" because he an urge. Pt states family has become accustomed to pt drinking and pt aware of risks associated with continuing to drink and unwillingess to seak outside treatment. Pt  declined any resources as he stated he is familiar with the resources in the community and was in a 30 day treatment 5 years agoa t [facility] in [place].    CSW explored whether pt would be open to placement if recommended by PT/OT. Pt stated he would like to return to his home at dc however would consider PT recommendations. CSW informed pt that CSW would return to discuss placement if that was recommended for pt. Pt understood and denied having any questions.    CSW will standby and await PT recomm.   Assessment/plan status:  Psychosocial Support/Ongoing Assessment of Needs Other assessment/ plan:   Information/referral to community resources:   CSW attempted to provide pt with resources to AA, outpatient and inpatient substance abuse treatment. Pt declined all resources, states he is familiar with AA and not interested in any resources. Pt declined CSW to even leave resources in the room. CSw completed SBIRT CSW did however provide pt with her contact information.    PATIENT'S/FAMILY'S RESPONSE TO PLAN OF CARE: Pt laying in bed alert and oriented. Pt pleasant to speak to, and answers questions appropriately. Pt admits to history of substance abuse however declines resources. CSW will standby and await PT recomm.        Theresia Bough, MSW, Theresia Majors [phone]

## 2011-11-13 NOTE — Progress Notes (Signed)
Utilization review completed.  

## 2011-11-13 NOTE — Progress Notes (Signed)
Family Medicine Teaching Service Daily Progress Note Service Page: [phone]  Patient Assessment: [name] is a 76 y.o. year old male presenting with acute alcohol intoxication after a possible fall, found to be hyponatremic and hypokalemic.   Subjective: Says he is feeling well today.  Complains of pain in his neck, but denies pain elsewhere. No shortness of breath or nausea. Ate breakfast well.  Objective: Temp:  [97.8 F (36.6 C)-98.7 F (37.1 C)] 98.7 F (37.1 C) (08/14 0500) Pulse Rate:  [49-68] 68  (08/14 0500) Resp:  [12-15] 15  (08/14 0500) BP: (96-138)/(53-76) 138/70 mmHg (08/14 0500) SpO2:  [94 %-100 %] 95 % (08/14 0500) Weight:  [181 lb (82.101 kg)] 181 lb (82.101 kg) (08/14 0000) Exam: General: NAD, lying in bed Cardiovascular: Afib on monitor, no murmurs auscultated Respiratory: CTAB Abdomen: soft, nontender, nondistended Extremities: SCD's in place, nontender to palpation  I have reviewed the patient's medications, labs, imaging, and diagnostic testing.  Notable results are summarized below.  CBC BMET   Lab 11/13/11 0921 11/12/11 1902  WBC 4.0 4.6  HGB 11.8* 11.3*  HCT 33.0* 31.3*  PLT 108* 130*    Lab 11/13/11 0921 11/13/11 0044 11/12/11 1902  NA 135 131* 125*  K 3.7 3.3* 2.9*  CL 96 91* 86*  CO2 26 28 23   BUN 9 9 10   CREATININE 0.78 0.85 0.89  GLUCOSE 74 76 89  CALCIUM 9.1 8.9 8.8     Imaging/Diagnostic Tests:  CT Cervical Spine:  1. Negative for fracture or other acute bony abnormality.  2. Multilevel degenerative changes as above.  3. Loss of the normal cervical spine lordosis, which may be  secondary to positioning, spasm, or soft tissue injury.  4. Bilateral carotid calcified plaque.  CT Head: 1. Negative for bleed or other acute intracranial process.  2. Atrophy and nonspecific white matter changes  CXR: Stable cardiomegaly. No active lung disease.  CE neg x2 Serum osmolality 311 Urine osmolality  Pro-BNP 754.1 INR  1.06  Assessment & Plan: [name] is a 76 y.o. year old male presenting with acute alcohol intoxication after a possible fall, found to be hyponatremic and hypokalemic.   1. Hyponatremia: Pt appeared fluid overloaded on admission, with lower extremity edema and moist mucous membranes, which made this hypervolemic hyponatremia. Somewhat unclear though, as serum osmolality is slightly elevated at 311 and patient has normal protein and glucose levels. The differential diagnosis includes CHF and cirrhosis, either of which could be possible in this patient who has chronically abused alcohol and has a history of diastolic CHF. Also thiazide and acute alcohol intoxication are likely contributing. Sodium has normalized to 135 today. Lungs remain clear. Pro-BNP 754. Platelets remain low at 108, and INR is 1.06. CXR shows no pulmonary edema. Continue KVO IV for now. Urinalysis normal. Continue to hold HCTZ.  2. Hypokalemia: K was 2.9 in ED. Given PO x2. K is now 3.7. 3. Hypertension: stable; continue home medications, but hold HCTZ due to hyponatremia. Also hold metoprolol due to bradycardia. 4. Chronic Diastolic Congestive Heart Failure - possibly contributing to hyponatremia; management as above. 5. Atrial fibrillation: from looking at records, it appears patient has never been on coumadin. Afib currently rate controlled. CHADS2VASC score is 4, which equates to a 4.0% stroke risk per year. INR is 1.06. May not be good anticoagulation candidate with falls and alcoholism. Given patient's current bradycardia, will likely discharge without metoprolol and have pt follow up in clinic soon. 6. CAD: Pt  has history of CAD, and is s/p two MI's according to EMR. Complained of chest discomfort to ED physician, but did not complain of it upon admission. EKG in ED with nonspecific ST segment changes, and junctional rhythm. Cardiac enzymes negative x2. Continue aspirin, statin.  7. Alcohol abuse: CIWA protocol. Fall  precautions. Has not required prn ativan. 8. FEN/GI: KVO IV. Regular diet 9. Prophylaxis: SCDs, Protonix 10. Disposition: Likely home today if able to ambulate normally, with close follow up. PT eval ordered. 11. Code Status: full code.  Levert Feinstein, MD 11/13/2011, 8:18 AM

## 2011-11-13 NOTE — Progress Notes (Signed)
Family Medicine Teaching Service Attending Note  I discussed patient [name]  with Dr. Pollie Meyer and reviewed their note for today.  I agree with their assessment and plan.

## 2011-11-14 LAB — BASIC METABOLIC PANEL
CO2: 29 mEq/L (ref 19–32)
Calcium: 9.2 mg/dL (ref 8.4–10.5)
Creatinine, Ser: 0.93 mg/dL (ref 0.50–1.35)
Glucose, Bld: 118 mg/dL — ABNORMAL HIGH (ref 70–99)

## 2011-11-14 MED ORDER — METOPROLOL TARTRATE 50 MG PO TABS: 25.0000 mg | ORAL_TABLET | Freq: Two times a day (BID) | ORAL | Status: DC

## 2011-11-14 MED ORDER — METOPROLOL TARTRATE 25 MG PO TABS
25.0000 mg | ORAL_TABLET | Freq: Two times a day (BID) | ORAL | Status: DC
  Administered 2011-11-14: 25 mg via ORAL
  Filled 2011-11-14 (×2): qty 1

## 2011-11-14 NOTE — Progress Notes (Signed)
Physical Therapy Treatment Patient Details Name: [name] MRN: [id number] DOB: 03-18-1936 Today's Date: 11/14/2011 Time: 4098-1191 PT Time Calculation (min): 20 min  PT Assessment / Plan / Recommendation Comments on Treatment Session  Pt states he is feeling stronger today.  Trialed ambulation with SPC vs RW.  Increased steadiness noted with RW.  Pt agreeable to use of RW at home to decrease fall risk.  Ambulation distance limited by increased HR (RN reported HR 120-130's & asked to return to room).      Follow Up Recommendations  Home health PT;Other (comment)    Barriers to Discharge        Equipment Recommendations  Rolling walker with 5" wheels    Recommendations for Other Services    Frequency Min 3X/week   Plan Discharge plan remains appropriate    Precautions / Restrictions Precautions Precautions: Fall     Pertinent Vitals/Pain HR increased 120-130's with ambulation.      Mobility  Bed Mobility Bed Mobility: Not assessed Transfers Transfers: Sit to Stand;Stand to Sit Sit to Stand: 4: Min guard;From bed;From toilet;With upper extremity assist Stand to Sit: 5: Supervision;With armrests;To bed;To chair/3-in-1 Details for Transfer Assistance: Reinforced safety, hand placement, & safe body positioning before sitting.   Ambulation/Gait Ambulation/Gait Assistance: 4: Min guard;4: Min Environmental consultant (Feet): 120 Feet Assistive device: Rolling walker;Straight cane;1 person hand held assist Ambulation/Gait Assistance Details: Trialed ambulation with SPC vs RW.  While using cane pt still using HHA  for steadiness.  When using RW pt with increased steadiness & increased independence.  Pt reports he feels more confident with use of RW conpared to [facility].   Gait Pattern: Step-through pattern;Decreased stride length;Wide base of support Stairs: No Wheelchair Mobility Wheelchair Mobility: No      PT Goals Acute Rehab PT Goals Time For Goal Achievement:  11/13/11 Potential to Achieve Goals: Good PT Goal: Sit to Stand - Progress: Progressing toward goal PT Transfer Goal: Bed to Chair/Chair to Bed - Progress: Progressing toward goal PT Goal: Ambulate - Progress: Progressing toward goal  Visit Information  Last PT Received On: 11/14/11 Assistance Needed: +1    Subjective Data  Subjective: "Im feeling stronger today"   Cognition  Overall Cognitive Status: Appears within functional limits for tasks assessed/performed Arousal/Alertness: Awake/alert Orientation Level: Appears intact for tasks assessed Behavior During Session: [facility] for tasks performed    Balance     End of Session PT - End of Session Equipment Utilized During Treatment: Gait belt Activity Tolerance: Patient tolerated treatment well;Other (comment) (ambulation distance limited by increased HR) Patient left: in chair;with call bell/phone within reach Nurse Communication: Mobility status    Verdell Face, [place] [phone] 11/14/2011

## 2011-11-14 NOTE — Progress Notes (Signed)
Pt ambulated in hallway with PT; noted pts HR to go up into the 120s-130s ST, pt with no c/o dizzyness or lightheadedness; with rest pts HR goes back to the 80s-90s SR; MDs paged and made aware

## 2011-11-14 NOTE — Progress Notes (Signed)
Family Medicine Teaching Service Daily Progress Note Service Page: [phone]  Patient Assessment: [name] is a 76 y.o. year old male presenting with acute alcohol intoxication after a possible fall, found to be hyponatremic and hypokalemic.   Subjective: Planned to d/c home yesterday but PT recommended home health PT. Says he is feeling better and stronger today. Denies having pain anywhere. Okay with getting home health PT. Plans to stay with his nephew for a few days when he leaves the hospital.  Objective: Temp:  [97.8 F (36.6 C)-98.5 F (36.9 C)] 98 F (36.7 C) (08/15 0600) Pulse Rate:  [61-83] 61  (08/15 0600) Resp:  [18] 18  (08/15 0600) BP: (119-158)/(70-96) 146/75 mmHg (08/15 0600) SpO2:  [96 %-99 %] 96 % (08/15 0600) Weight:  [180 lb 14.4 oz (82.056 kg)] 180 lb 14.4 oz (82.056 kg) (08/15 0600) Exam: General: NAD, lying in bed Cardiovascular: no murmurs auscultated Respiratory: CTAB Abdomen: soft, nontender, nondistended Extremities: 1+ edema bilaterally, nontender  I have reviewed the patient's medications, labs, imaging, and diagnostic testing.  Notable results are summarized below.  CBC BMET   Lab 11/13/11 0921 11/12/11 1902  WBC 4.0 4.6  HGB 11.8* 11.3*  HCT 33.0* 31.3*  PLT 108* 130*    Lab 11/13/11 0921 11/13/11 0044 11/12/11 1902  NA 135 131* 125*  K 3.7 3.3* 2.9*  CL 96 91* 86*  CO2 26 28 23   BUN 9 9 10   CREATININE 0.78 0.85 0.89  GLUCOSE 74 76 89  CALCIUM 9.1 8.9 8.8     Imaging/Diagnostic Tests:  CT Cervical Spine:  1. Negative for fracture or other acute bony abnormality.  2. Multilevel degenerative changes as above.  3. Loss of the normal cervical spine lordosis, which may be  secondary to positioning, spasm, or soft tissue injury.  4. Bilateral carotid calcified plaque.  CT Head: 1. Negative for bleed or other acute intracranial process.  2. Atrophy and nonspecific white matter changes  CXR: Stable cardiomegaly. No active lung  disease.  CE neg x3 Serum osmolality 311 Urine osmolality  Pro-BNP 754.1 INR 1.06  Assessment & Plan: [name] is a 76 y.o. year old male presenting with acute alcohol intoxication after a possible fall, found to be hyponatremic and hypokalemic.   1. Hyponatremia: Pt appeared fluid overloaded on admission, with lower extremity edema and moist mucous membranes, which made this hypervolemic hyponatremia. Somewhat unclear though, as serum osmolality was slightly elevated at 311 and patient has normal protein and glucose levels. The differential diagnosis includes CHF and cirrhosis, either of which could be possible in this patient who has chronically abused alcohol and has a history of diastolic CHF. Also thiazide and acute alcohol intoxication are likely contributing. Sodium  normalized to 135 yesterday, BMET for today is pending. Lungs remain clear to auscultation. Continue KVO IV for now. Continue to hold HCTZ for now.  2. Hypokalemia: getting KCl PO BID. Potassium today is pending; had normalized to 3.7 yesterday. 3. Hypertension: stable; continue home medications, but hold HCTZ due to hyponatremia. Also holding metoprolol due to bradycardia. 4. Chronic Diastolic Congestive Heart Failure - possibly contributing to hyponatremia; management as above. 5. Atrial fibrillation: from looking at records, it appears patient has never been on coumadin. Afib currently rate controlled. CHADS2VASC score is 4, which equates to a 4.0% stroke risk per year. INR is 1.06. May not be good anticoagulation candidate with falls and alcoholism. HR was low (<60) yesterday but has since come up and  been above 60 for much of the last 24 hours. Will plan to d/c home on reduced dose of metoprolol and have pt follow up in clinic with PCP Dr. Sheffield Slider soon. 6. CAD: Pt has history of CAD, and is s/p two MI's according to EMR. Complained of chest discomfort to ED physician, but did not complain of it upon admission. EKG in  ED with nonspecific ST segment changes, and junctional rhythm. Cardiac enzymes negative x3. Continue aspirin, statin.  7. Alcohol abuse: CIWA protocol. Fall precautions. Has not required prn ativan. 8. FEN/GI: KVO IV. Regular diet 9. Prophylaxis: SCDs, Protonix 10. Disposition: Home today with home health PT as long as no gross abnormalities on BMET; with close clinic follow up. 11. Code Status: full code.  Levert Feinstein, MD 11/14/2011, 8:18 AM

## 2011-11-14 NOTE — Progress Notes (Signed)
Family Medicine Teaching Service Attending Note  I interviewed and examined patient [name] and reviewed their tests and x-rays.  I discussed with Dr. Pollie Meyer and reviewed their note for today.  I agree with their assessment and plan.     Additionally  Feels well today Ready to go home Has transient black spots in front of R eye - no pain or flashing ligthts.  Did not hit and orbit is not sore PERRL - Difficult fundi exam since pupils are very reactive.  No bleeding or retinal changes noted Acuity intact bilaterally  Ok to discharge.  Long term prognosis is poor given unrepentant ethoh use.

## 2011-11-14 NOTE — Progress Notes (Signed)
Pt c/o "black film" over his right eye, states it just started 10 mins ago, but that he can still see through it; neuro checks WNL; VSS and doc in flowsheets; paging MD to make aware; will continue to monitor

## 2011-11-14 NOTE — Care Management Note (Signed)
    Page 1 of 2   11/14/2011     3:06:51 PM   CARE MANAGEMENT NOTE 11/14/2011  Patient:  [name], [name]   Account Number:  [id number]  Date Initiated:  11/13/2011  Documentation initiated by:  Donn Pierini  Subjective/Objective Assessment:   Pt admitted with hyponatremia, fall, ETOH     Action/Plan:   PTA pt lived at home alone, PT eval   Anticipated DC Date:  11/14/2011   Anticipated DC Plan:  HOME W HOME HEALTH SERVICES      DC Planning Services  CM consult      [facility] Choice  DURABLE MEDICAL EQUIPMENT  HOME HEALTH   Choice offered to / List presented to:  [name]   DME arranged  Levan Hurst      DME agency  [facility]     HH arranged  HH-2 PT      [facility] agency  [facility]   Status of service:  Completed, signed off Medicare Important Message given?   (If response is "NO", the following Medicare IM given date fields will be blank) Date Medicare IM given:   Date Additional Medicare IM given:    Discharge Disposition:  HOME W HOME HEALTH SERVICES  Per UR Regulation:  Reviewed for med. necessity/level of care/duration of stay  If discussed at Long Length of Stay Meetings, dates discussed:    Comments:  11/14/11- 1500- Donn Pierini RN, BSN [phone] Order for [facility] and RW- spoke with pt at bedside- per conversation pt states he plans to go stay with his nephew a few days prior to returning home- pt states that he has used [facility] in past for [facility] services and would like to use them again for both [facility] and DME needs. Referral sent to [facility] via [facility] and call made to [facility] regarding RW need and to [facility] with [facility] for [facility] needs. RW to be delivered to room prior to discharge- [facility] services to begin first of next week when pt returns home.

## 2011-11-15 NOTE — Discharge Summary (Signed)
I have reviewed this discharge summary and agree.    

## 2011-11-15 NOTE — Discharge Summary (Signed)
Family Medicine Teaching [facility] Discharge Summary  Patient name: [name] Medical record number: [id number] Date of birth: [birth year] Age: 76 y.o. Gender: male Date of Admission: 11/12/2011  Date of Discharge: 11/14/2011 Admitting Physician: Sanjuana Letters, MD  Primary Care Provider: Tobin Chad, MD  Indication for Hospitalization: hyponatremia, acute alcohol intoxication  Discharge Diagnoses:  1. Hyponatremia (primary diagnosis) 2. Hypokalemia 3. Alcohol abuse 4. Hypertension 5. Chronic diastolic CHF 6. Atrial fibrillation 7. Bradycardia 8. Coronary artery disease  Consultations: none  Significant Labs and Imaging:  CT Cervical Spine:  1. Negative for fracture or other acute bony abnormality.  2. Multilevel degenerative changes as above.  3. Loss of the normal cervical spine lordosis, which may be  secondary to positioning, spasm, or soft tissue injury.  4. Bilateral carotid calcified plaque.   CT Head:  1. Negative for bleed or other acute intracranial process.  2. Atrophy and nonspecific white matter changes   CXR:  Stable cardiomegaly. No active lung disease.   CE neg x3  Serum osmolality 311  Urine osmolality  Pro-BNP 754.1  INR 1.06  Procedures: none  Brief Hospital Course:   1. Hyponatremia: Pt presented in ED acutely intoxicated after a fall and was found to be hyponatremic with a sodium of 125. He appeared fluid overloaded on admission, with lower extremity edema and moist mucous membranes. Differential diagnosis of hypervolemic hyponatremia included CHF and cirrhosis, but we also included thiazide and acute alcohol intoxication as contributing factors in our differential. Patient was admitted, his HCTZ held, and his sodium gradually normalized to 138 by day of discharge. 2. Hypokalemia: Was 2.9 on admission, received supplementation and was 4.4 on day of discharge. 3. Hypertension: Home medications were continued, but HCTZ was  held due to hyponatremia.We also held his metoprolol due to bradycardia. 4. Chronic Diastolic Congestive Heart Failure - possibly contributed to hyponatremia; management as above. 5. Atrial fibrillation: Pt has hx of atrial fibrillation not on coumadin. INR 1.06 on admission. from looking at records, it appears patient has never been on coumadin. HR was low (<60) initially during hospitalization, so metoprolol was held. Pt was discharged on a reduced dose of 25 mg BID of his metoprolol (had previously been getting 75 mg BID) to avoid symptomatic bradycardia. This will need to be re-evaluated at follow up. 6. CAD: Pt has history of CAD, and is s/p two MI's according to EMR. Complained of chest discomfort to ED physician. EKG in ED with nonspecific ST segment changes, and junctional rhythm. Cardiac enzymes negative x3. Continued aspirin, statin.  7. Alcohol abuse: Was on CIWA protocol throughout admission, with fall precautions. Did not require any prn ativan during admission. 8. Disposition: To home with HHPT; with close clinic follow up.  Discharge Medications:  Medication List  As of 11/15/2011  4:23 PM   TAKE these medications         aspirin EC 81 MG tablet   Take 81 mg by mouth daily.      Fish Oil 1200 MG Caps   Take 1 capsule by mouth daily.      hydrochlorothiazide 25 MG tablet   Commonly known as: HYDRODIURIL   Take 25 mg by mouth daily.      lisinopril 5 MG tablet   Commonly known as: PRINIVIL,ZESTRIL   Take 5 mg by mouth daily.      metoprolol 50 MG tablet   Commonly known as: LOPRESSOR   Take 0.5 tablets (25 mg total) by mouth 2 (  two) times daily.      multivitamin with minerals Tabs   Take 1 tablet by mouth daily.      nitroGLYCERIN 0.4 MG SL tablet   Commonly known as: NITROSTAT   Place 0.4 mg under the tongue every 5 (five) minutes x 3 doses as needed. For chest pain.      omeprazole 20 MG capsule   Commonly known as: PRILOSEC   Take 20 mg by mouth daily.       polyethylene glycol powder powder   Commonly known as: GLYCOLAX/MIRALAX   Take 17 g by mouth daily as needed. For chest pain      simvastatin 20 MG tablet   Commonly known as: ZOCOR   Take 20 mg by mouth at bedtime.      traMADol 50 MG tablet   Commonly known as: ULTRAM   Take 50 mg by mouth every 8 (eight) hours as needed. For pain           Issues for Follow Up:  -Metoprolol dose was reduced from 75mg  BID to 25mg  BID as patient was bradycardic during hospitalization. This will need to be readdressed and potentially increased if pt has stable HR or is tachycardic. -Alcohol abuse  Outstanding Results: none  Discharge Instructions: Please refer to Patient Instructions section of EMR for full details.  Patient was counseled important signs and symptoms that should prompt return to medical care, changes in medications, dietary instructions, activity restrictions, and follow up appointments.    Follow-up Information    Schedule an appointment as soon as possible for a visit with Tobin Chad, MD.   Contact information:   [address] [place] [address] [phone]         Discharge Condition: stable  Levert Feinstein, MD 11/15/2011, 4:23 PM

## 2011-11-19 NOTE — Progress Notes (Signed)
Have collaborated with this note. 11/19/2011  [place] Bing, PT [phone] (pager)

## 2011-11-22 ENCOUNTER — Inpatient Hospital Stay: Payer: Medicare Other | Admitting: Family Medicine

## 2012-01-30 ENCOUNTER — Ambulatory Visit: Payer: Medicare Other

## 2012-02-03 ENCOUNTER — Encounter (INDEPENDENT_AMBULATORY_CARE_PROVIDER_SITE_OTHER): Payer: Medicare Other | Admitting: Family Medicine

## 2012-02-03 DIAGNOSIS — Z0279 Encounter for issue of other medical certificate: Secondary | ICD-10-CM

## 2012-02-03 NOTE — Progress Notes (Signed)
Patient ID: [name], male   DOB: [birth year], 76 y.o.   MRN: [id number] 485 form reviewed and signed.

## 2012-03-12 ENCOUNTER — Ambulatory Visit (INDEPENDENT_AMBULATORY_CARE_PROVIDER_SITE_OTHER): Payer: Medicare Other | Admitting: Family Medicine

## 2012-03-12 ENCOUNTER — Encounter: Payer: Self-pay | Admitting: Family Medicine

## 2012-03-12 VITALS — BP 152/86 | HR 76 | Temp 98.2°F | Ht 71.0 in | Wt 183.0 lb

## 2012-03-12 DIAGNOSIS — I10 Essential (primary) hypertension: Secondary | ICD-10-CM

## 2012-03-12 DIAGNOSIS — F1010 Alcohol abuse, uncomplicated: Secondary | ICD-10-CM

## 2012-03-12 DIAGNOSIS — M79609 Pain in unspecified limb: Secondary | ICD-10-CM

## 2012-03-12 DIAGNOSIS — K219 Gastro-esophageal reflux disease without esophagitis: Secondary | ICD-10-CM

## 2012-03-12 DIAGNOSIS — I4891 Unspecified atrial fibrillation: Secondary | ICD-10-CM

## 2012-03-12 DIAGNOSIS — E559 Vitamin D deficiency, unspecified: Secondary | ICD-10-CM

## 2012-03-12 DIAGNOSIS — D539 Nutritional anemia, unspecified: Secondary | ICD-10-CM

## 2012-03-12 DIAGNOSIS — I2510 Atherosclerotic heart disease of native coronary artery without angina pectoris: Secondary | ICD-10-CM

## 2012-03-12 DIAGNOSIS — R269 Unspecified abnormalities of gait and mobility: Secondary | ICD-10-CM

## 2012-03-12 DIAGNOSIS — M79672 Pain in left foot: Secondary | ICD-10-CM | POA: Insufficient documentation

## 2012-03-12 DIAGNOSIS — Z23 Encounter for immunization: Secondary | ICD-10-CM

## 2012-03-12 LAB — COMPREHENSIVE METABOLIC PANEL
Alkaline Phosphatase: 69 U/L (ref 39–117)
BUN: 10 mg/dL (ref 6–23)
Creat: 0.84 mg/dL (ref 0.50–1.35)
Glucose, Bld: 99 mg/dL (ref 70–99)
Total Bilirubin: 2.5 mg/dL — ABNORMAL HIGH (ref 0.3–1.2)

## 2012-03-12 LAB — CBC
HCT: 34.7 % — ABNORMAL LOW (ref 39.0–52.0)
Hemoglobin: 12 g/dL — ABNORMAL LOW (ref 13.0–17.0)
RDW: 12.8 % (ref 11.5–15.5)
WBC: 6.1 10*3/uL (ref 4.0–10.5)

## 2012-03-12 MED ORDER — NITROGLYCERIN 0.4 MG SL SUBL: 0.4000 mg | SUBLINGUAL_TABLET | SUBLINGUAL | Status: DC | PRN

## 2012-03-12 NOTE — Assessment & Plan Note (Signed)
No recent symptoms

## 2012-03-12 NOTE — Assessment & Plan Note (Signed)
Cbc

## 2012-03-12 NOTE — Assessment & Plan Note (Addendum)
Due to loss of metatarsal arch. Will check for high uric acid causing tophi, but not history of podagra. Consider referral to sports medicine for evaluation

## 2012-03-12 NOTE — Assessment & Plan Note (Addendum)
He remains in denial about the consequences of his illness. He was informed about the potential for liver cirrhosis. CMET done for transaminases and albumin

## 2012-03-12 NOTE — Assessment & Plan Note (Signed)
Clinically in this with controlled rate

## 2012-03-12 NOTE — Progress Notes (Signed)
  Subjective:    Patient ID: [name], male    DOB: [birth year], 77 y.o.   MRN: [id number]  HPI Alcohol - 3-4 beers daily, avoiding hard liquor. No abdominal pain. Eating a :balanced diet" Doesn't recall the August admission for electrolyte abnormalities from an alcohol binge.   Falls - none recently. Walks wide based due to unsteadiness.   Foot pain - on right that he attributes to hammer toes. Worse on the tip of the left 2nd toe.   Back pain - controllable with Tramadol   Review of Systems     Objective:   Physical Exam  Constitutional:       Not pale or thinner  Cardiovascular: Normal rate and regular rhythm.   No murmur heard. Pulmonary/Chest: Effort normal and breath sounds normal.  Abdominal: Soft. He exhibits no distension and no mass. There is no tenderness. There is no rebound.  Musculoskeletal:       Loss of metatarsal arch greater on left than right with minimal hammering of toes. ? 1 mm tophi around left second toe DIP jt. Enlarged left first MP jt, but no hallux valgus.   Neurological: He is alert. Coordination abnormal.  Skin:       Multiple healed laceration scars over his balding head  Psychiatric: He has a normal mood and affect.  Balance and Gait testing Stood without using his arms Romberg steady, tandem stance unsteady.  Gait wide based and asymmetric No path deviation Trunk slightly forward with decreased mobility         Assessment & Plan:

## 2012-03-12 NOTE — Assessment & Plan Note (Signed)
Repeat blood pressure was in normal range. CMET done for electrolytes

## 2012-03-12 NOTE — Patient Instructions (Signed)
Please return to see Dr Sheffield Slider in 3months.  I will call you if any problems with your lab tests  Try to avoid alcohol to prevent pancreatitis, falls, and liver problems.

## 2012-03-13 ENCOUNTER — Telehealth: Payer: Self-pay | Admitting: Family Medicine

## 2012-03-13 ENCOUNTER — Encounter: Payer: Self-pay | Admitting: Family Medicine

## 2012-03-13 DIAGNOSIS — N401 Enlarged prostate with lower urinary tract symptoms: Secondary | ICD-10-CM

## 2012-03-13 LAB — VITAMIN D 25 HYDROXY (VIT D DEFICIENCY, FRACTURES): Vit D, 25-Hydroxy: 37 ng/mL (ref 30–89)

## 2012-03-13 NOTE — Telephone Encounter (Signed)
I informed him of his lab results and for the hyponatremia, I asked him to cut his HCTZ pills in half and to schedule a visit in one month for blood pressure check and possible blood test.

## 2012-04-09 ENCOUNTER — Ambulatory Visit: Payer: Medicare Other

## 2012-04-16 ENCOUNTER — Other Ambulatory Visit: Payer: Self-pay | Admitting: *Deleted

## 2012-04-16 MED ORDER — POLYETHYLENE GLYCOL 3350 17 GM/SCOOP PO POWD: 17.0000 g | Freq: Every day | ORAL | Status: DC | PRN

## 2012-05-21 ENCOUNTER — Ambulatory Visit (INDEPENDENT_AMBULATORY_CARE_PROVIDER_SITE_OTHER): Payer: Medicare Other | Admitting: Family Medicine

## 2012-05-21 ENCOUNTER — Encounter: Payer: Self-pay | Admitting: Family Medicine

## 2012-05-21 VITALS — BP 140/73 | HR 60 | Ht 71.0 in | Wt 182.0 lb

## 2012-05-21 DIAGNOSIS — Z8546 Personal history of malignant neoplasm of prostate: Secondary | ICD-10-CM

## 2012-05-21 DIAGNOSIS — F329 Major depressive disorder, single episode, unspecified: Secondary | ICD-10-CM

## 2012-05-21 DIAGNOSIS — I10 Essential (primary) hypertension: Secondary | ICD-10-CM

## 2012-05-21 DIAGNOSIS — M545 Low back pain: Secondary | ICD-10-CM

## 2012-05-21 DIAGNOSIS — E781 Pure hyperglyceridemia: Secondary | ICD-10-CM

## 2012-05-21 DIAGNOSIS — I4891 Unspecified atrial fibrillation: Secondary | ICD-10-CM

## 2012-05-21 DIAGNOSIS — I2510 Atherosclerotic heart disease of native coronary artery without angina pectoris: Secondary | ICD-10-CM

## 2012-05-21 DIAGNOSIS — F1010 Alcohol abuse, uncomplicated: Secondary | ICD-10-CM

## 2012-05-21 MED ORDER — TRAMADOL HCL 50 MG PO TABS: 50.0000 mg | ORAL_TABLET | Freq: Three times a day (TID) | ORAL | Status: DC | PRN

## 2012-05-21 NOTE — Addendum Note (Signed)
Addended by: Zachery Dauer on: 05/21/2012 04:01 PM   Modules accepted: Orders

## 2012-05-21 NOTE — Assessment & Plan Note (Signed)
well controlled  

## 2012-05-21 NOTE — Assessment & Plan Note (Signed)
No recent symptoms. Needs lipids before next visit

## 2012-05-21 NOTE — Progress Notes (Signed)
  Subjective:    Patient ID: [name], male    DOB: 1935-06-03, 77 y.o.   MRN: [id number]  HPI Alcohol - 2-3 beers daily. Avoids whiskey that was associated with his binges and falls. His nephew and his wife stopped all alcohol.   CHF - No shortness of breath. Only right ankle swells.   Prostate CA - has appointment with Dr [name] in April  Advanced directives - [name], niece. He needs to designate a back-up person.   Back pain - Continues Tramadol twice daily which controls it fairly well. It does limit his activity.    Review of Systems     Objective:   Physical Exam  Cardiovascular: Normal rate and regular rhythm.   Occasional premature beat  Pulmonary/Chest: Effort normal and breath sounds normal. He has no wheezes. He has no rales.  Abdominal: Soft. Bowel sounds are normal. He exhibits no distension and no mass. There is no tenderness. There is no guarding.  Musculoskeletal:  Trace ankle edema  Neurological: He is alert.  Psychiatric: He has a normal mood and affect. His behavior is normal. Thought content normal.          Assessment & Plan:

## 2012-05-21 NOTE — Assessment & Plan Note (Signed)
Stable, I encouraged hamstring stretching

## 2012-05-21 NOTE — Assessment & Plan Note (Signed)
No recent binges

## 2012-05-21 NOTE — Assessment & Plan Note (Signed)
Clinically in NSR

## 2012-05-21 NOTE — Patient Instructions (Signed)
Please return to see Dr Sheffield Slider in 3 months.  Please schedule fasting lab test visit a couple months before.  Continue to avoid whiskey and minimize beer. Cholesterol Cholesterol is a white, waxy, fat-like protein needed by your body in small amounts. The liver makes all the cholesterol you need. It is carried from the liver by the blood through the blood vessels. Deposits (plaque) may build up on blood vessel walls. This makes the arteries narrower and stiffer. Plaque increases the risk for heart attack and stroke. You cannot feel your cholesterol level even if it is very high. The only way to know is by a blood test to check your lipid (fats) levels. Once you know your cholesterol levels, you should keep a record of the test results. Work with your caregiver to to keep your levels in the desired range. WHAT THE RESULTS MEAN:  Total cholesterol is a rough measure of all the cholesterol in your blood.  LDL is the so-called bad cholesterol. This is the type that deposits cholesterol in the walls of the arteries. You want this level to be low.  HDL is the good cholesterol because it cleans the arteries and carries the LDL away. You want this level to be high.  Triglycerides are fat that the body can either burn for energy or store. High levels are closely linked to heart disease. DESIRED LEVELS:  Total cholesterol below 200.  LDL below 100 for people at risk, below 70 for very high risk.  HDL above 50 is good, above 60 is best.  Triglycerides below 150. HOW TO LOWER YOUR CHOLESTEROL:  Diet.  Choose fish or white meat chicken and [place], roasted or baked. Limit fatty cuts of red meat, fried foods, and processed meats, such as sausage and lunch meat.  Eat lots of fresh fruits and vegetables. Choose whole grains, beans, pasta, potatoes and cereals.  Use only small amounts of olive, corn or canola oils. Avoid butter, mayonnaise, shortening or palm kernel oils. Avoid foods with  trans-fats.  Use skim/nonfat milk and low-fat/nonfat yogurt and cheeses. Avoid whole milk, cream, ice cream, egg yolks and cheeses. Healthy desserts include angel food cake, ginger snaps, animal crackers, hard candy, popsicles, and low-fat/nonfat frozen yogurt. Avoid pastries, cakes, pies and cookies.  Exercise.  A regular program helps decrease LDL and raises HDL.  Helps with weight control.  Do things that increase your activity level like gardening, walking, or taking the stairs.  Medication.  May be prescribed by your caregiver to help lowering cholesterol and the risk for heart disease.  You may need medicine even if your levels are normal if you have several risk factors. HOME CARE INSTRUCTIONS   Follow your diet and exercise programs as suggested by your caregiver.  Take medications as directed.  Have blood work done when your caregiver feels it is necessary. MAKE SURE YOU:   Understand these instructions.  Will watch your condition.  Will get help right away if you are not doing well or get worse. Document Released: 12/11/2000 Document Revised: 06/10/2011 Document Reviewed: 06/03/2007 [facility] Patient Information 2013 [place], [place].

## 2012-06-11 IMAGING — CR DG CERVICAL SPINE COMPLETE 4+V
6 series · 6 of 6 positions shown · non-contrast
Comparison: None.

CLINICAL DATA: Fall, neck pain

CERVICAL SPINE - COMPLETE 4+ VIEW

[w c-spine lat]
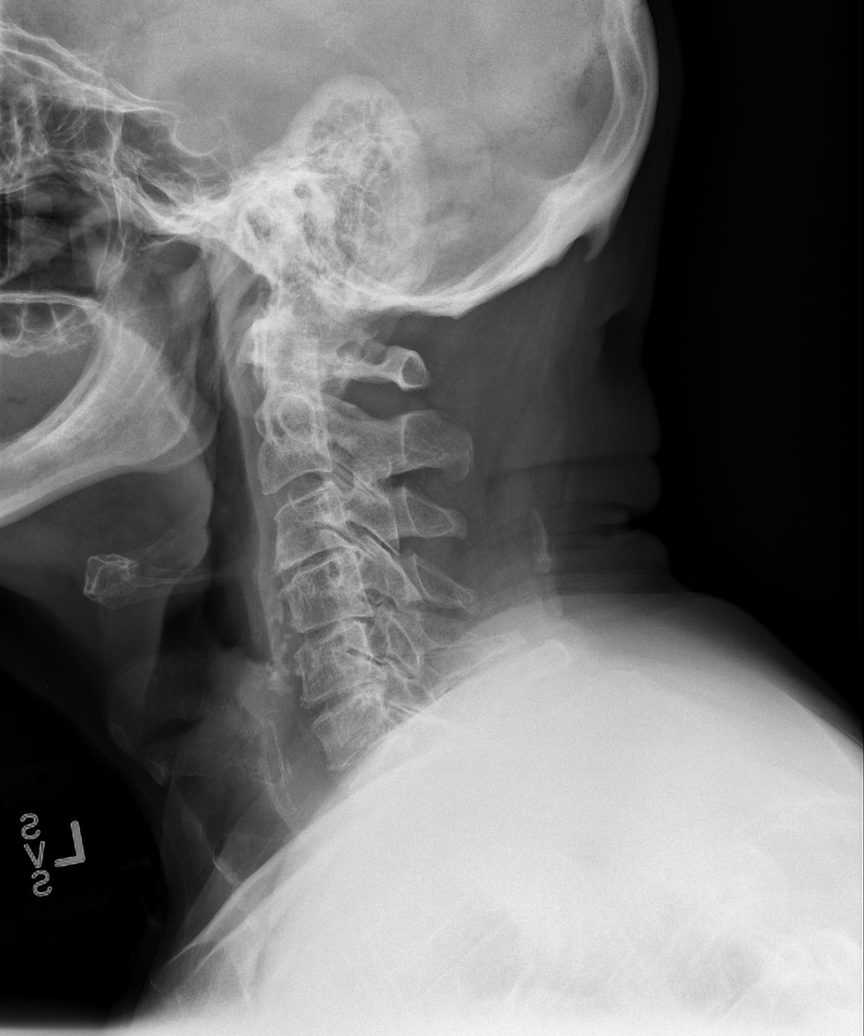

[w c-spine oblique (1 of 2)]
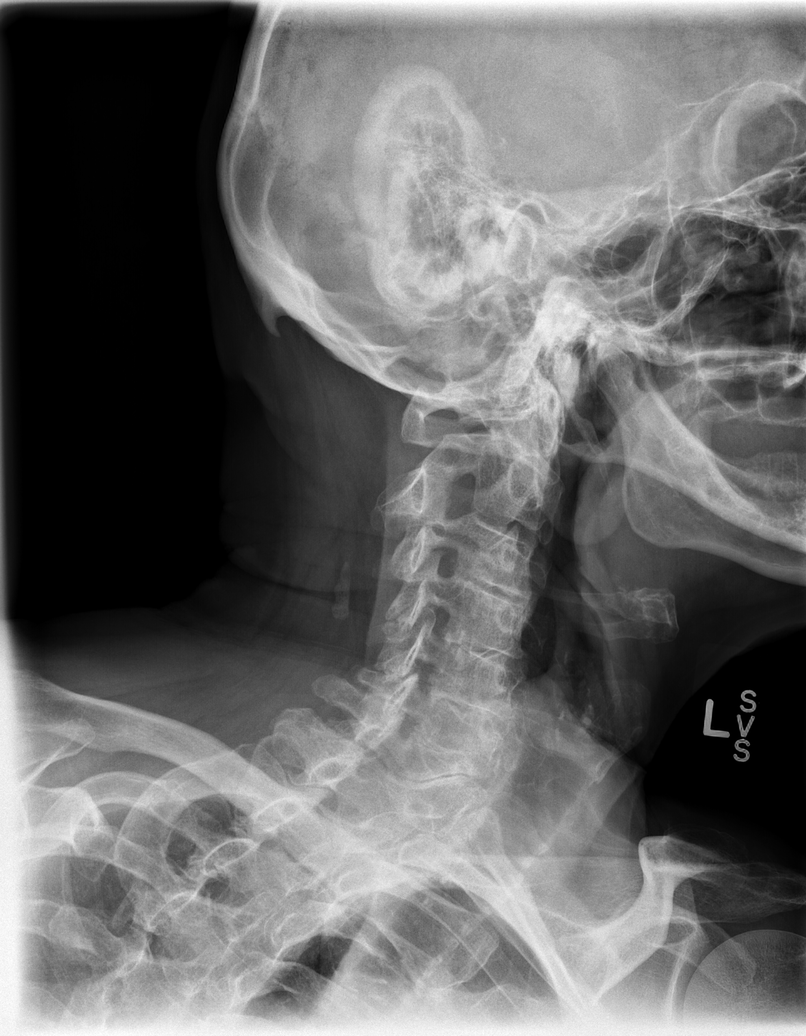

[w c-spine oblique (2 of 2)]
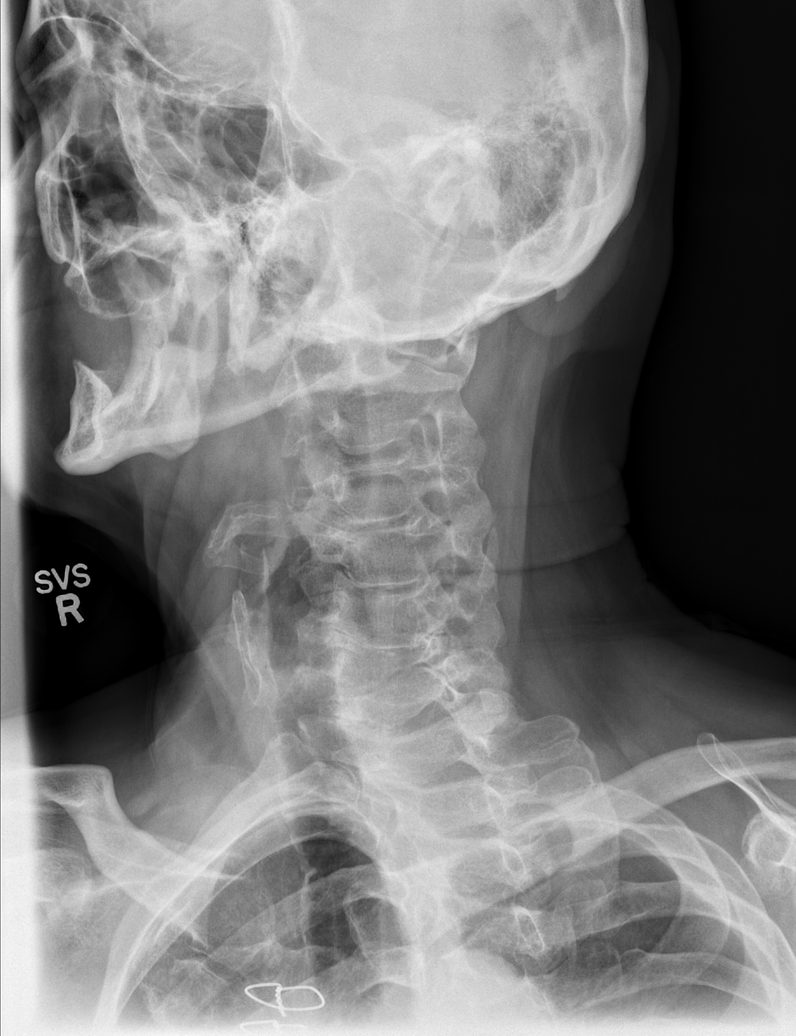

[w c-spine a.p.]
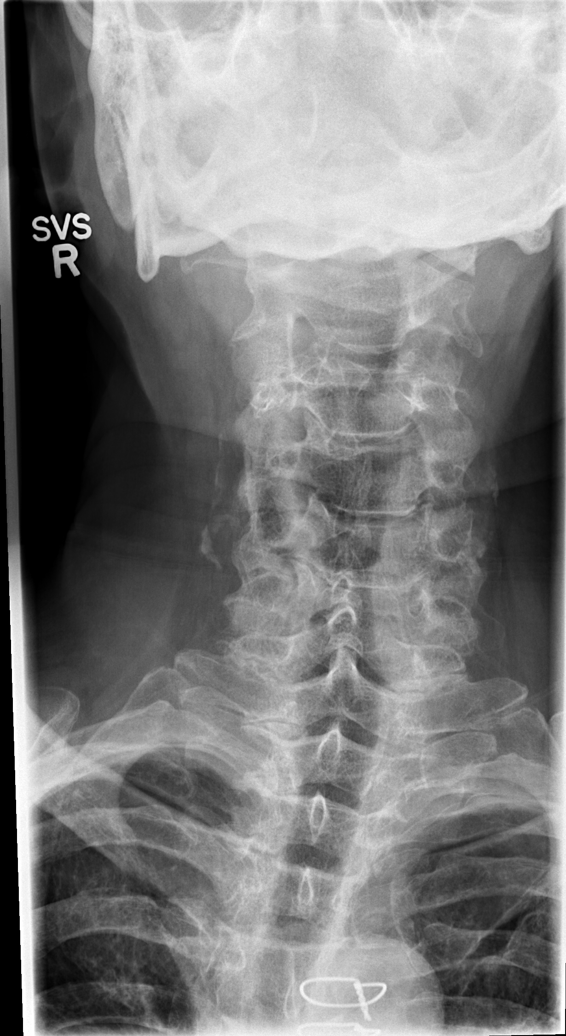

[w c-spine odontoid]
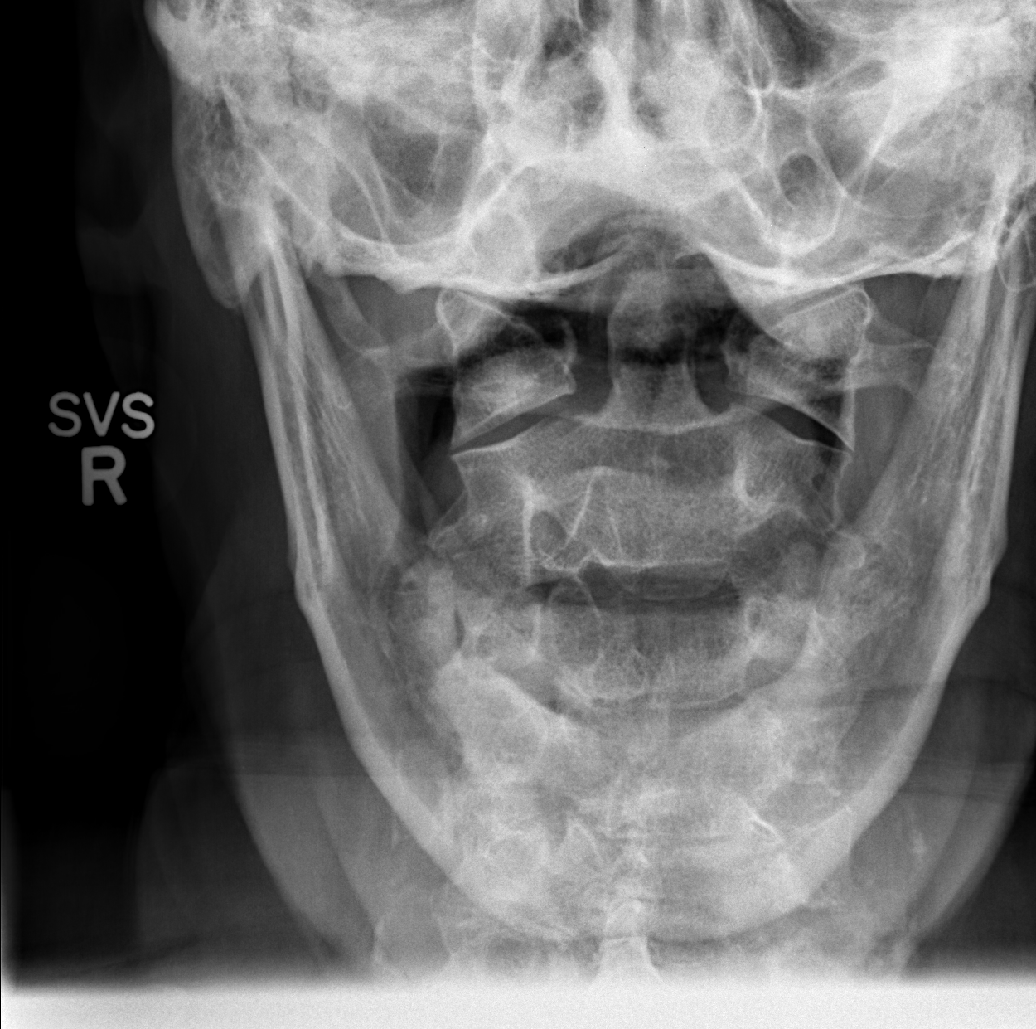

[w swimmers view]
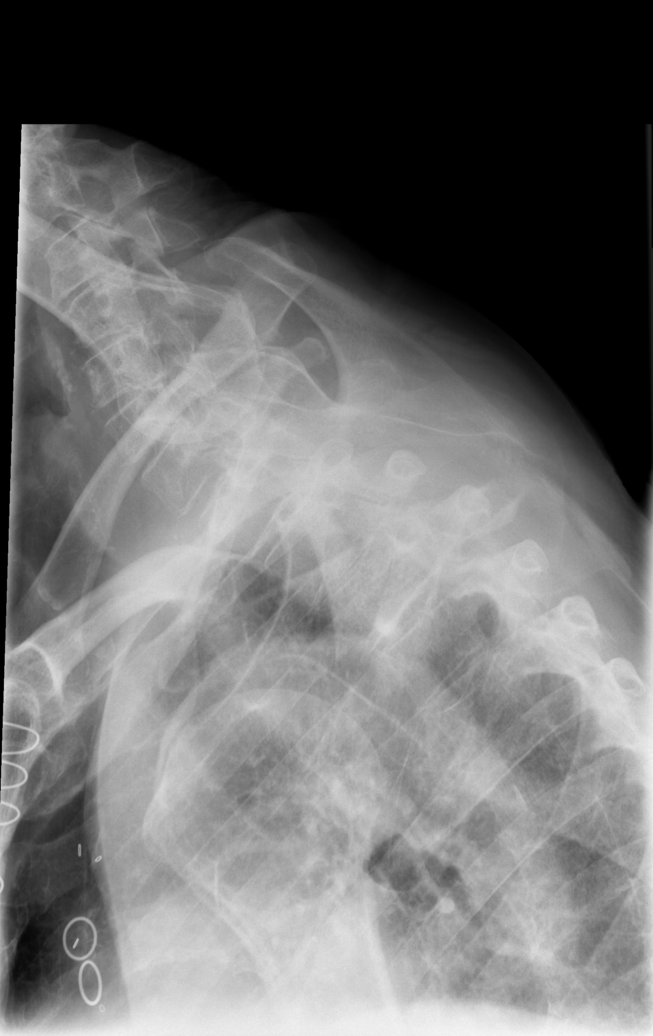

[6 of 6 positions shown; findings below may reference images not displayed]

FINDINGS: There is no evidence of acute fracture.  Anterior
osteophytes are seen extending from C3-C6.  4 mm of retrolisthesis
is seen of C4 on C5.  The cervical thoracic  junction is intact.
No prevertebral soft tissue swelling is seen.  Atherosclerotic
calcifications are seen in the carotid arteries.
IMPRESSION: Multilevel degenerative changes without evidence of acute fracture.
If there is persistent pain or concern for fracture, CT may be of
use for further evaluation.

## 2012-06-19 ENCOUNTER — Other Ambulatory Visit: Payer: Self-pay | Admitting: Family Medicine

## 2012-07-09 ENCOUNTER — Encounter: Payer: Self-pay | Admitting: Family Medicine

## 2012-09-12 ENCOUNTER — Emergency Department (HOSPITAL_COMMUNITY): Payer: Medicare Other

## 2012-09-12 ENCOUNTER — Inpatient Hospital Stay (HOSPITAL_COMMUNITY)
Admission: EM | Admit: 2012-09-12 | Discharge: 2012-09-16 | DRG: 641 | Disposition: A | Discharge status: Home / Self Care | Payer: Medicare Other | Attending: Family Medicine | Admitting: Family Medicine

## 2012-09-12 DIAGNOSIS — G8929 Other chronic pain: Secondary | ICD-10-CM | POA: Diagnosis present

## 2012-09-12 DIAGNOSIS — I509 Heart failure, unspecified: Secondary | ICD-10-CM | POA: Diagnosis present

## 2012-09-12 DIAGNOSIS — N401 Enlarged prostate with lower urinary tract symptoms: Secondary | ICD-10-CM | POA: Diagnosis present

## 2012-09-12 DIAGNOSIS — E781 Pure hyperglyceridemia: Secondary | ICD-10-CM | POA: Diagnosis present

## 2012-09-12 DIAGNOSIS — D72819 Decreased white blood cell count, unspecified: Secondary | ICD-10-CM | POA: Diagnosis present

## 2012-09-12 DIAGNOSIS — I5032 Chronic diastolic (congestive) heart failure: Secondary | ICD-10-CM | POA: Diagnosis present

## 2012-09-12 DIAGNOSIS — I252 Old myocardial infarction: Secondary | ICD-10-CM

## 2012-09-12 DIAGNOSIS — E876 Hypokalemia: Secondary | ICD-10-CM | POA: Diagnosis present

## 2012-09-12 DIAGNOSIS — F411 Generalized anxiety disorder: Secondary | ICD-10-CM | POA: Diagnosis present

## 2012-09-12 DIAGNOSIS — Z8546 Personal history of malignant neoplasm of prostate: Secondary | ICD-10-CM

## 2012-09-12 DIAGNOSIS — E871 Hypo-osmolality and hyponatremia: Principal | ICD-10-CM | POA: Diagnosis present

## 2012-09-12 DIAGNOSIS — F329 Major depressive disorder, single episode, unspecified: Secondary | ICD-10-CM | POA: Diagnosis present

## 2012-09-12 DIAGNOSIS — R531 Weakness: Secondary | ICD-10-CM | POA: Diagnosis present

## 2012-09-12 DIAGNOSIS — I2510 Atherosclerotic heart disease of native coronary artery without angina pectoris: Secondary | ICD-10-CM | POA: Diagnosis present

## 2012-09-12 DIAGNOSIS — I498 Other specified cardiac arrhythmias: Secondary | ICD-10-CM | POA: Diagnosis present

## 2012-09-12 DIAGNOSIS — M549 Dorsalgia, unspecified: Secondary | ICD-10-CM | POA: Diagnosis present

## 2012-09-12 DIAGNOSIS — I4891 Unspecified atrial fibrillation: Secondary | ICD-10-CM | POA: Diagnosis present

## 2012-09-12 DIAGNOSIS — F1010 Alcohol abuse, uncomplicated: Secondary | ICD-10-CM | POA: Diagnosis present

## 2012-09-12 DIAGNOSIS — F10920 Alcohol use, unspecified with intoxication, uncomplicated: Secondary | ICD-10-CM

## 2012-09-12 DIAGNOSIS — F3289 Other specified depressive episodes: Secondary | ICD-10-CM | POA: Diagnosis present

## 2012-09-12 DIAGNOSIS — R4182 Altered mental status, unspecified: Secondary | ICD-10-CM

## 2012-09-12 DIAGNOSIS — I447 Left bundle-branch block, unspecified: Secondary | ICD-10-CM | POA: Diagnosis present

## 2012-09-12 DIAGNOSIS — M47814 Spondylosis without myelopathy or radiculopathy, thoracic region: Secondary | ICD-10-CM | POA: Diagnosis present

## 2012-09-12 DIAGNOSIS — Z951 Presence of aortocoronary bypass graft: Secondary | ICD-10-CM

## 2012-09-12 DIAGNOSIS — M47817 Spondylosis without myelopathy or radiculopathy, lumbosacral region: Secondary | ICD-10-CM | POA: Diagnosis present

## 2012-09-12 DIAGNOSIS — I10 Essential (primary) hypertension: Secondary | ICD-10-CM | POA: Diagnosis present

## 2012-09-12 DIAGNOSIS — Z87891 Personal history of nicotine dependence: Secondary | ICD-10-CM

## 2012-09-12 LAB — URINALYSIS, ROUTINE W REFLEX MICROSCOPIC
Glucose, UA: NEGATIVE mg/dL
Leukocytes, UA: NEGATIVE
Specific Gravity, Urine: 1.01 (ref 1.005–1.030)
pH: 6 (ref 5.0–8.0)

## 2012-09-12 LAB — CBC WITH DIFFERENTIAL/PLATELET
Basophils Absolute: 0 10*3/uL (ref 0.0–0.1)
Eosinophils Absolute: 0.1 10*3/uL (ref 0.0–0.7)
Eosinophils Relative: 2 % (ref 0–5)
Lymphocytes Relative: 30 % (ref 12–46)
MCV: 90.8 fL (ref 78.0–100.0)
Platelets: 131 10*3/uL — ABNORMAL LOW (ref 150–400)
RDW: 12.6 % (ref 11.5–15.5)
WBC: 5.8 10*3/uL (ref 4.0–10.5)

## 2012-09-12 LAB — COMPREHENSIVE METABOLIC PANEL
Albumin: 3.1 g/dL — ABNORMAL LOW (ref 3.5–5.2)
BUN: 10 mg/dL (ref 6–23)
Calcium: 8.6 mg/dL (ref 8.4–10.5)
Creatinine, Ser: 0.67 mg/dL (ref 0.50–1.35)
Potassium: 3.1 mEq/L — ABNORMAL LOW (ref 3.5–5.1)
Total Protein: 6.1 g/dL (ref 6.0–8.3)

## 2012-09-12 LAB — POCT I-STAT, CHEM 8
BUN: 9 mg/dL (ref 6–23)
Chloride: 79 mEq/L — ABNORMAL LOW (ref 96–112)
Sodium: 118 mEq/L — CL (ref 135–145)

## 2012-09-12 LAB — TROPONIN I: Troponin I: <0.3 ng/mL (ref <0.30)

## 2012-09-12 MED ORDER — SODIUM CHLORIDE 0.9 % IV SOLN: INTRAVENOUS | Status: DC

## 2012-09-12 MED ORDER — SODIUM CHLORIDE 0.9 % IV BOLUS (SEPSIS)
500.0000 mL | Freq: Once | INTRAVENOUS | Status: AC
  Administered 2012-09-12: 500 mL via INTRAVENOUS

## 2012-09-12 MED ORDER — SODIUM CHLORIDE 0.9 % IV BOLUS (SEPSIS)
1000.0000 mL | Freq: Once | INTRAVENOUS | Status: AC
  Administered 2012-09-12: 1000 mL via INTRAVENOUS

## 2012-09-12 NOTE — ED Notes (Signed)
Pt present with EMS with c/o SOB, weakness, vertigo for 2 yrs.  Per EMS, pt ETOH with "several beers today."  Pt came from home.  Pt hx MI x2 and prostate CA.  Pt denies SOB at this time.  Pt vitals 140/70 HR 60, SPO2 100 RA.  cbg 121.

## 2012-09-12 NOTE — ED Notes (Signed)
Bed:WA02<BR> Expected date:<BR> Expected time:<BR> Means of arrival:<BR> Comments:<BR>
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
## 2012-09-13 ENCOUNTER — Encounter (HOSPITAL_COMMUNITY): Payer: Self-pay | Admitting: *Deleted

## 2012-09-13 DIAGNOSIS — E876 Hypokalemia: Secondary | ICD-10-CM

## 2012-09-13 DIAGNOSIS — E871 Hypo-osmolality and hyponatremia: Secondary | ICD-10-CM

## 2012-09-13 DIAGNOSIS — F1010 Alcohol abuse, uncomplicated: Secondary | ICD-10-CM

## 2012-09-13 HISTORY — DX: Hypo-osmolality and hyponatremia: E87.1

## 2012-09-13 LAB — BASIC METABOLIC PANEL
BUN: 6 mg/dL (ref 6–23)
BUN: 10 mg/dL (ref 6–23)
BUN: 19 mg/dL (ref 6–23)
CO2: 28 mEq/L (ref 19–32)
CO2: 29 mEq/L (ref 19–32)
CO2: 27 mEq/L (ref 19–32)
Calcium: 9 mg/dL (ref 8.4–10.5)
Calcium: 8.5 mg/dL (ref 8.4–10.5)
Chloride: 87 mEq/L — ABNORMAL LOW (ref 96–112)
Chloride: 91 mEq/L — ABNORMAL LOW (ref 96–112)
Chloride: 93 mEq/L — ABNORMAL LOW (ref 96–112)
Chloride: 104 mEq/L (ref 96–112)
Creatinine, Ser: 0.65 mg/dL (ref 0.50–1.35)
Creatinine, Ser: 0.87 mg/dL (ref 0.50–1.35)
GFR calc Af Amer: >90 mL/min (ref >90)
GFR calc Af Amer: >90 mL/min (ref >90)
GFR calc Af Amer: >90 mL/min (ref >90)
GFR calc non Af Amer: >90 mL/min (ref >90)
GFR calc non Af Amer: 83 mL/min — ABNORMAL LOW (ref >90)
Glucose, Bld: 107 mg/dL — ABNORMAL HIGH (ref 70–99)
Glucose, Bld: 187 mg/dL — ABNORMAL HIGH (ref 70–99)
Glucose, Bld: 197 mg/dL — ABNORMAL HIGH (ref 70–99)
Potassium: 3.5 mEq/L (ref 3.5–5.1)
Potassium: 3.6 mEq/L (ref 3.5–5.1)
Potassium: 3.3 mEq/L — ABNORMAL LOW (ref 3.5–5.1)
Sodium: 126 mEq/L — ABNORMAL LOW (ref 135–145)
Sodium: 128 mEq/L — ABNORMAL LOW (ref 135–145)
Sodium: 139 mEq/L (ref 135–145)

## 2012-09-13 LAB — CBC
HCT: 30.2 % — ABNORMAL LOW (ref 39.0–52.0)
Hemoglobin: 11 g/dL — ABNORMAL LOW (ref 13.0–17.0)
MCH: 33.1 pg (ref 26.0–34.0)
MCHC: 36.4 g/dL — ABNORMAL HIGH (ref 30.0–36.0)
MCV: 91 fL (ref 78.0–100.0)
Platelets: 115 10*3/uL — ABNORMAL LOW (ref 150–400)
RBC: 3.32 MIL/uL — ABNORMAL LOW (ref 4.22–5.81)
RDW: 12.5 % (ref 11.5–15.5)
WBC: 2.9 10*3/uL — ABNORMAL LOW (ref 4.0–10.5)

## 2012-09-13 LAB — PROTIME-INR
INR: 0.99 (ref 0.00–1.49)
Prothrombin Time: 13 seconds (ref 11.6–15.2)

## 2012-09-13 LAB — SODIUM, URINE, RANDOM: Sodium, Ur: <10 mEq/L

## 2012-09-13 LAB — PRO B NATRIURETIC PEPTIDE: Pro B Natriuretic peptide (BNP): 1584 pg/mL — ABNORMAL HIGH (ref 0–125)

## 2012-09-13 LAB — OSMOLALITY, URINE: Osmolality, Ur: 116 mOsm/kg — ABNORMAL LOW (ref 390–1090)

## 2012-09-13 LAB — MRSA PCR SCREENING: MRSA by PCR: NEGATIVE

## 2012-09-13 LAB — OSMOLALITY: Osmolality: 276 mOsm/kg (ref 275–300)

## 2012-09-13 LAB — TROPONIN I: Troponin I: <0.3 ng/mL (ref <0.30)

## 2012-09-13 MED ORDER — POTASSIUM CHLORIDE 10 MEQ/100ML IV SOLN
10.0000 meq | INTRAVENOUS | Status: DC
  Administered 2012-09-13: 10 meq via INTRAVENOUS
  Filled 2012-09-13: qty 100

## 2012-09-13 MED ORDER — ASPIRIN EC 81 MG PO TBEC
81.0000 mg | DELAYED_RELEASE_TABLET | Freq: Every day | ORAL | Status: DC
  Administered 2012-09-13 – 2012-09-16 (×4): 81 mg via ORAL
  Filled 2012-09-13 (×4): qty 1

## 2012-09-13 MED ORDER — ONDANSETRON HCL 4 MG/2ML IJ SOLN: 4.0000 mg | Freq: Four times a day (QID) | INTRAMUSCULAR | Status: DC | PRN

## 2012-09-13 MED ORDER — ADULT MULTIVITAMIN W/MINERALS CH
1.0000 | ORAL_TABLET | Freq: Every day | ORAL | Status: DC
  Administered 2012-09-13 – 2012-09-16 (×4): 1 via ORAL
  Filled 2012-09-13 (×4): qty 1

## 2012-09-13 MED ORDER — SODIUM CHLORIDE 0.9 % IJ SOLN
3.0000 mL | Freq: Two times a day (BID) | INTRAMUSCULAR | Status: DC
  Administered 2012-09-13 – 2012-09-16 (×6): 3 mL via INTRAVENOUS

## 2012-09-13 MED ORDER — POTASSIUM CHLORIDE CRYS ER 20 MEQ PO TBCR
40.0000 meq | EXTENDED_RELEASE_TABLET | Freq: Once | ORAL | Status: AC
  Administered 2012-09-13: 40 meq via ORAL
  Filled 2012-09-13: qty 2

## 2012-09-13 MED ORDER — FOLIC ACID 1 MG PO TABS
1.0000 mg | ORAL_TABLET | Freq: Every day | ORAL | Status: DC
  Administered 2012-09-13 – 2012-09-16 (×4): 1 mg via ORAL
  Filled 2012-09-13 (×4): qty 1

## 2012-09-13 MED ORDER — ONDANSETRON HCL 4 MG PO TABS: 4.0000 mg | ORAL_TABLET | Freq: Four times a day (QID) | ORAL | Status: DC | PRN

## 2012-09-13 MED ORDER — METOPROLOL TARTRATE 25 MG PO TABS
25.0000 mg | ORAL_TABLET | Freq: Two times a day (BID) | ORAL | Status: DC
  Administered 2012-09-13 – 2012-09-14 (×3): 25 mg via ORAL
  Filled 2012-09-13 (×4): qty 1

## 2012-09-13 MED ORDER — LORAZEPAM 0.5 MG PO TABS
1.0000 mg | ORAL_TABLET | Freq: Four times a day (QID) | ORAL | Status: AC | PRN
  Administered 2012-09-15: 1 mg via ORAL
  Filled 2012-09-13: qty 2

## 2012-09-13 MED ORDER — VITAMIN B-1 100 MG PO TABS
100.0000 mg | ORAL_TABLET | Freq: Every day | ORAL | Status: DC
Start: 2012-09-13 — End: 2012-09-16
  Administered 2012-09-13 – 2012-09-16 (×4): 100 mg via ORAL
  Filled 2012-09-13 (×4): qty 1

## 2012-09-13 MED ORDER — OXYBUTYNIN CHLORIDE 5 MG PO TABS
5.0000 mg | ORAL_TABLET | Freq: Every day | ORAL | Status: DC
  Administered 2012-09-13 – 2012-09-15 (×3): 5 mg via ORAL
  Filled 2012-09-13 (×4): qty 1

## 2012-09-13 MED ORDER — POLYETHYLENE GLYCOL 3350 17 G PO PACK
17.0000 g | PACK | Freq: Every day | ORAL | Status: DC | PRN
  Filled 2012-09-13 (×2): qty 1

## 2012-09-13 MED ORDER — PANTOPRAZOLE SODIUM 40 MG PO TBEC
40.0000 mg | DELAYED_RELEASE_TABLET | Freq: Every day | ORAL | Status: DC
  Administered 2012-09-13 – 2012-09-16 (×4): 40 mg via ORAL
  Filled 2012-09-13 (×4): qty 1

## 2012-09-13 MED ORDER — THIAMINE HCL 100 MG/ML IJ SOLN
100.0000 mg | Freq: Every day | INTRAMUSCULAR | Status: DC
  Filled 2012-09-13 (×3): qty 1

## 2012-09-13 MED ORDER — METOPROLOL TARTRATE 50 MG PO TABS
50.0000 mg | ORAL_TABLET | Freq: Two times a day (BID) | ORAL | Status: DC
  Filled 2012-09-13 (×2): qty 1

## 2012-09-13 MED ORDER — LORAZEPAM 2 MG/ML IJ SOLN: 1.0000 mg | Freq: Four times a day (QID) | INTRAMUSCULAR | Status: AC | PRN

## 2012-09-13 MED ORDER — LISINOPRIL 5 MG PO TABS
5.0000 mg | ORAL_TABLET | Freq: Every day | ORAL | Status: DC
  Administered 2012-09-13 – 2012-09-16 (×4): 5 mg via ORAL
  Filled 2012-09-13 (×4): qty 1

## 2012-09-13 MED ORDER — HEPARIN SODIUM (PORCINE) 5000 UNIT/ML IJ SOLN
5000.0000 [IU] | Freq: Three times a day (TID) | INTRAMUSCULAR | Status: DC
  Administered 2012-09-13 – 2012-09-16 (×11): 5000 [IU] via SUBCUTANEOUS
  Filled 2012-09-13 (×13): qty 1

## 2012-09-13 MED ORDER — SODIUM CHLORIDE 0.9 % IV SOLN
INTRAVENOUS | Status: DC
  Administered 2012-09-13: 10 mL/h via INTRAVENOUS

## 2012-09-13 MED ORDER — M.V.I. ADULT IV INJ
INJECTION | Freq: Once | INTRAVENOUS | Status: DC
  Filled 2012-09-13: qty 1000

## 2012-09-13 MED ORDER — SIMVASTATIN 20 MG PO TABS
20.0000 mg | ORAL_TABLET | Freq: Every evening | ORAL | Status: DC
  Administered 2012-09-14 – 2012-09-15 (×2): 20 mg via ORAL
  Filled 2012-09-13 (×4): qty 1

## 2012-09-13 MED ORDER — TRAMADOL HCL 50 MG PO TABS
50.0000 mg | ORAL_TABLET | Freq: Three times a day (TID) | ORAL | Status: DC | PRN
  Administered 2012-09-14 – 2012-09-15 (×2): 50 mg via ORAL
  Filled 2012-09-13 (×3): qty 1

## 2012-09-13 NOTE — Progress Notes (Signed)
Dr Pollie Meyer made aware of pt's discomfort from Potassium bolus infusions.  Patent with good blood return. New orders received.

## 2012-09-13 NOTE — H&P (Signed)
[facility] Attending Admission Note: Nicolette Bang Pager [phone] I  have seen and examined this patient, reviewed their chart. I have discussed this patient with the resident. I agree with the resident's findings, assessment and care plan.  Patient with PMx Afib,CHF,CAD,Alcoholism present to [facility] for hx of SOB and mild chest pain associated with generalized weakness,he denies any cough,no fall or loss of consciousness,he stated this was how he felt when he had his MI in the past hence he decided to call EMS. Currently he denies any SOB,no chest pain,he feels well,eating his breakfast.  Exam; Gen: Not in distress,calm in bed. Neuro: Awake and alert,oriented x 3 Resp: Air entry equal B/L with B/L basal crackles. CV: S1 S2 no murmurs irregular rhythm. Abd: Soft,NT/ND, BS normal. Ext: Trace B/L pedal edema.  A/P: 77 y/o male with 1. Electrolyte imbalance;( hyponatremia and hypokalemia) Low sodium of 116 on admission likely due to beer potomania.     Urine osmolality low.     S/P NS bolus now on fluid restriction,sodium level improved to 126.     Cycle BMET.     Replete potassium.  2. Cardiac: CHF with basal crackles and elevated pro BNP.                    May be contributing to his SOB,currently asymptomatic.                    Lasix held due to hyponatremia.                    Currently on fluid restriction,monitor I&O.                    Monitor O2 Sat.                    Continue B-blocker.                    AFIB/CAD: not on anticoagulant for unknown reason,?? Fall risk in an alcoholics                    ASA 81 mg PO qd.                    Heparin for DVT and PE prophylaxis.                    Continue telemetry.  3.Alcoholism on CIWA protocol,I agree with social worker consult for his alcoholism.  4. HTN: Continue BP medications except diuretic.
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
## 2012-09-13 NOTE — Progress Notes (Signed)
Date of notification:  6/15  Time of notification:  0655  Critical value read back: K2.7  Nurse who received alert:  Tula Nakayama  MD notified (1st page):Dr. Street  Time of first page:  07:00  MD notified (2nd page):  Time of second page:  Responding MD: Dr. Casper Harrison   Time MD responded:  07:05

## 2012-09-13 NOTE — ED Notes (Signed)
Pt reports that the chsst pain was earlier today but resolved prior to ED visit

## 2012-09-13 NOTE — ED Provider Notes (Signed)
Medical screening examination/treatment/procedure(s) were conducted as a shared visit with non-physician practitioner(s) and myself.  I personally evaluated the patient during the encounter  [name] is a 77 y.o. male chronic alcoholic here with vague SOB. Patient unable to give reliable history. Mental status altered. Na 116 likely contributing to AMS, likely from dehydration. ETOH level elevated. Patient given NS 1L bolus and was admitted to step down under Family medicine.   CRITICAL CARE Performed by: Silverio Lay, Nial Hawe   Total critical care time: 30 min   Critical care time was exclusive of separately billable procedures and treating other patients.  Critical care was necessary to treat or prevent imminent or life-threatening deterioration.  Critical care was time spent personally by me on the following activities: development of treatment plan with patient and/or surrogate as well as nursing, discussions with consultants, evaluation of patient's response to treatment, examination of patient, obtaining history from patient or surrogate, ordering and performing treatments and interventions, ordering and review of laboratory studies, ordering and review of radiographic studies, pulse oximetry and re-evaluation of patient's condition.    [name], MD 09/13/12 1455

## 2012-09-13 NOTE — Progress Notes (Signed)
Pt slept throughout shift. Easily arousable for meals.Alert X 4. Good appetite. No c/o anxiety or pain. See flow sheet for strict I & O with improving NA and K trend.

## 2012-09-14 ENCOUNTER — Encounter (HOSPITAL_COMMUNITY): Payer: Self-pay | Admitting: Physician Assistant

## 2012-09-14 DIAGNOSIS — I4891 Unspecified atrial fibrillation: Secondary | ICD-10-CM

## 2012-09-14 LAB — CBC WITH DIFFERENTIAL/PLATELET
Basophils Relative: 1 % (ref 0–1)
Eosinophils Absolute: 0 10*3/uL (ref 0.0–0.7)
Eosinophils Relative: 1 % (ref 0–5)
Hemoglobin: 11.2 g/dL — ABNORMAL LOW (ref 13.0–17.0)
Lymphs Abs: 1.1 10*3/uL (ref 0.7–4.0)
MCH: 32.8 pg (ref 26.0–34.0)
MCHC: 34.7 g/dL (ref 30.0–36.0)
MCV: 94.7 fL (ref 78.0–100.0)
Monocytes Relative: 9 % (ref 3–12)
Platelets: 111 10*3/uL — ABNORMAL LOW (ref 150–400)
RBC: 3.41 MIL/uL — ABNORMAL LOW (ref 4.22–5.81)

## 2012-09-14 LAB — BASIC METABOLIC PANEL
BUN: 11 mg/dL (ref 6–23)
CO2: 30 mEq/L (ref 19–32)
CO2: 27 mEq/L (ref 19–32)
Calcium: 8.7 mg/dL (ref 8.4–10.5)
Calcium: 8.6 mg/dL (ref 8.4–10.5)
Creatinine, Ser: 0.86 mg/dL (ref 0.50–1.35)
GFR calc non Af Amer: 83 mL/min — ABNORMAL LOW (ref >90)
Glucose, Bld: 94 mg/dL (ref 70–99)
Glucose, Bld: 128 mg/dL — ABNORMAL HIGH (ref 70–99)
Sodium: 131 mEq/L — ABNORMAL LOW (ref 135–145)

## 2012-09-14 MED ORDER — POTASSIUM CHLORIDE CRYS ER 20 MEQ PO TBCR
40.0000 meq | EXTENDED_RELEASE_TABLET | Freq: Once | ORAL | Status: AC
  Administered 2012-09-14: 40 meq via ORAL
  Filled 2012-09-14: qty 2

## 2012-09-14 MED ORDER — MAGNESIUM SULFATE 40 MG/ML IJ SOLN
2.0000 g | Freq: Once | INTRAMUSCULAR | Status: AC
  Administered 2012-09-14: 2 g via INTRAVENOUS
  Filled 2012-09-14 (×2): qty 50

## 2012-09-14 MED ORDER — MAGNESIUM SULFATE 40 MG/ML IJ SOLN
2.0000 g | Freq: Once | INTRAMUSCULAR | Status: AC
  Administered 2012-09-14: 2 g via INTRAVENOUS
  Filled 2012-09-14: qty 50

## 2012-09-14 NOTE — Progress Notes (Signed)
Clinical Social Work Department BRIEF PSYCHOSOCIAL ASSESSMENT 09/14/2012  Patient:  [name], [name]     Account Number:  [id number]     Admit date:  09/12/2012  Clinical Social Worker:  Margaree Mackintosh  Date/Time:  09/14/2012 02:43 PM  Referred by:  Physician  Date Referred:  09/14/2012 Referred for  Substance Abuse   Other Referral:   Interview type:  Patient Other interview type:    PSYCHOSOCIAL DATA Living Status:  ALONE Admitted from facility:   Level of care:   Primary support name:  Cindi Carbon: [phone] Primary support relationship to patient:  FAMILY Degree of support available:   Niece/Unknown level of support.    CURRENT CONCERNS Current Concerns  Post-Acute Placement   Other Concerns:    SOCIAL WORK ASSESSMENT / PLAN Clinical Social Worker received referral for Substance Abuse resources.  CSW reviewed chart and met with pt at bedside.  CSW introduced self, explained role, and provided support.  CSW provided active listening.  Pt confirmed interest in Substance Abuse resources.  Pt able to name several resources in the area.  Pt states, "It's easy to get off track but I'm ready to begin looking at these again".  CSW provided support and validated pt's feelings. CSW to sign off at this time.   Assessment/plan status:  Information/Referral to [facility] Other assessment/ plan:   Information/referral to community resources:   Intensive Outpatient Substance Abuse  AA  Residential Substance Abuse    PATIENT'S/FAMILY'S RESPONSE TO PLAN OF CARE: Pt was pleasant and engaged in conversation. Pt thanked CSW for intervention.       Angelia Mould, MSW, [place] [phone]

## 2012-09-14 NOTE — Progress Notes (Addendum)
Family Medicine Teaching Service Daily Progress Note Service Pager: [phone]   Subjective: Pt reports he is feeling well today. Denies chest pain or SOB. Is eating and drinking well. Discussed patient's alcohol use with him today. He says he drinks 6 beers per day and has for the last 50 years. States he has been dishonest with PCP in the past about his alcohol use because he did not like how much it was brought up in every visit. Does not want to quit drinking but is willing to talk to a social worker about potential resources for cutting back. He says he does not see his drinking as a problem and uses it as a way to relax.  Objective: Temp:  [97.8 F (36.6 C)-98.7 F (37.1 C)] 97.8 F (36.6 C) (06/16 0744) Pulse Rate:  [52-76] 57 (06/16 0500) Resp:  [11-21] 14 (06/16 0744) BP: (109-140)/(44-87) 140/61 mmHg (06/16 0744) SpO2:  [96 %-99 %] 99 % (06/16 0744) Weight:  [176 lb 5.9 oz (80 kg)] 176 lb 5.9 oz (80 kg) (06/16 0455) Exam: General: NAD, lying in bed, awake and alert Cardiovascular: irregulary irregular heartbeat, heart sounds distant Respiratory: normal respiratory effort, fine crackles present at the bases Abdomen: soft, nontender to palpation Extremities: no appreciable lower extremity edema bilaterally, calves nontender to palpation  I have reviewed the patient's medications, labs, imaging, and diagnostic testing.  Notable results are summarized below.  Medications:  Scheduled Meds: . aspirin EC  81 mg Oral Daily  . folic acid  1 mg Oral Daily  . heparin  5,000 Units Subcutaneous Q8H  . lisinopril  5 mg Oral Daily  . metoprolol  25 mg Oral BID  . multivitamin with minerals  1 tablet Oral Daily  . oxybutynin  5 mg Oral QHS  . pantoprazole  40 mg Oral Daily  . simvastatin  20 mg Oral QPM  . sodium chloride  3 mL Intravenous Q12H  . thiamine  100 mg Oral Daily   Or  . thiamine  100 mg Intravenous Daily   Continuous Infusions: . sodium chloride 10 mL/hr (09/13/12  0500)   PRN Meds:.LORazepam, LORazepam, ondansetron (ZOFRAN) IV, ondansetron, polyethylene glycol, traMADol  Labs:  CBC  Recent Labs Lab 09/12/12 2113 09/12/12 2222 09/13/12 0555  WBC 5.8  --  2.9*  HGB 10.6* 11.2* 11.0*  HCT 29.5* 33.0* 30.2*  PLT 131*  --  115*     CMET  Recent Labs Lab 09/12/12 2113  09/13/12 1538 09/13/12 2225 09/14/12 0450  NA 116*  < > 129* 139 131*  K 3.1*  < > 3.6 3.3* 3.4*  CL 78*  < > 93* 104 94*  CO2 25  < > 28 27 30   BUN 10  < > 10 19 11   CREATININE 0.67  < > 0.79 0.87 0.99  GLUCOSE 89  < > 187* 197* 94  CALCIUM 8.6  < > 8.9 8.5 8.7  AST 21  --   --   --   --   ALT 13  --   --   --   --   ALKPHOS 56  --   --   --   --   PROT 6.1  --   --   --   --   ALBUMIN 3.1*  --   --   --   --   < > = values in this interval not displayed.    Pro-BNP 1584 INR 0.99 Urine sodium <10 Urine osmoles 116 Serum  osmoles 276  Assessment/Plan:  [name] is a 77 y.o. year old male presenting initially with shortness of breath, acute intoxication, and hyponatremia. PMH significant for CAD and MI s/p CABG, atrial fibrillation with bundle branch block and bradycardia, HTN, HLD, and heavy alcohol use ans similar presentations to the hospital. Generally poor historian, but SOB seemed to have resolved on its own; labs significant for hyponatremia and hypokalemia. CXR negative for acute process, EKG unchanged, VSS.   #Hyponatremia - Most likely secondary to both acute and chronic EtOH use and generally poor PO intake (low albumin, plt 131k). Also possible contribution of CHF and HCTZ use for HTN. S/p NS bolus and maintenance fluids at [facility] ED. Pt does not appear grossly fluid-overloaded.  -continue KVO IV fluids and fluid restriction to 1800 mL/day  -hold HCTZ  - sodium corrected to 131 (had gone to 139 yesterday, but back down, 139 was likely lab error) - urine studies (Na, Osm) appropriate in comparison to serum osmoles  #Hypokalemia - Most likely  related to the above. S/p repletion, K 3.4 today - PO x 1 today - Mg 1.5 yesterday, replete with 2g IV x1 today [ ]  recheck BMET and Mg this PM  # Chest pain - vague chest pain/discomfort prior to presentation - troponins negative x 3  #Atrial fibrillation with bradycardia - not on chronic anticoagulation, rate-controlled with metoprolol  - EKG today remains unchanged (except no further bradycardia) but does show junctional escape rhythm - will consult cardiology today given abnormal rhythm  #CAD, etc - Per pt history; s/p MI x2 and CABG  - continue aspirin 81mg  daily  #HTN - appears relatively well controlled with lisinopril and HCTZ  -continue home meds, ASA, metoprolol, lisinopril, Zocor; holding HCTZ as above  [ ]  telemetry monitoring for now, especially with borderline bradycardia on beta blocker   # Leukopenia: noted on CBC yesterday, remains leukopenic today - likely result of chronic alcohol abuse  #Chronic back pain - at baseline, pt uses tramadol BID PRN, will continue here   #Heavy alcohol use - likely contributing to hyponatremia as above, as well as weakness and general health status  [ ]  CIWA protocol with seizure and fall precautions  [ ]  CSW consult for hx of alcohol abuse/dependence   FEN/GI: heart-healthy diet with 1800 mL fluid restriction, KVO IVF   Prophylaxis: subQ heparin   Disposition: pending cardiology input and social work consult  Code Status: Full   Levert Feinstein, MD [facility] Medicine PGY-1 Service Pager [phone]

## 2012-09-14 NOTE — Progress Notes (Signed)
FMTS Attending Daily Note: Denny Levy MD [phone] pager office [phone] I  have seen and examined this patient, reviewed their chart. I have discussed this patient with the resident. I agree with the resident's findings, assessment and care plan. ADDITIONALLY: I read his EKG as JUNCTIONAL ESCAPE, not a fib. Have asked cardiology to weigh in.

## 2012-09-14 NOTE — Progress Notes (Signed)
FMTS Attending Admission Note: Nelani Schmelzle,MD I  have seen and examined this patient, reviewed their chart. I have discussed this patient with the resident. I agree with the resident's findings, assessment and care plan.  

## 2012-09-14 NOTE — Progress Notes (Signed)
[name], P.A notified of bradycardia 30's-50's unsustained.  Pt asymptomatic, VSS, will continue to monitor and evaluate treatment effectiveness
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
## 2012-09-15 DIAGNOSIS — I2510 Atherosclerotic heart disease of native coronary artery without angina pectoris: Secondary | ICD-10-CM

## 2012-09-15 LAB — COMPREHENSIVE METABOLIC PANEL
BUN: 9 mg/dL (ref 6–23)
CO2: 28 mEq/L (ref 19–32)
Chloride: 94 mEq/L — ABNORMAL LOW (ref 96–112)
Creatinine, Ser: 0.83 mg/dL (ref 0.50–1.35)
GFR calc Af Amer: >90 mL/min (ref >90)
GFR calc non Af Amer: 85 mL/min — ABNORMAL LOW (ref >90)
Glucose, Bld: 96 mg/dL (ref 70–99)
Total Bilirubin: 1.4 mg/dL — ABNORMAL HIGH (ref 0.3–1.2)

## 2012-09-15 LAB — CBC
MCH: 33.1 pg (ref 26.0–34.0)
Platelets: 114 10*3/uL — ABNORMAL LOW (ref 150–400)
RBC: 3.26 MIL/uL — ABNORMAL LOW (ref 4.22–5.81)
WBC: 3.5 10*3/uL — ABNORMAL LOW (ref 4.0–10.5)

## 2012-09-15 MED ORDER — METOPROLOL TARTRATE 12.5 MG HALF TABLET
12.5000 mg | ORAL_TABLET | Freq: Two times a day (BID) | ORAL | Status: DC
  Filled 2012-09-15: qty 1

## 2012-09-15 MED ORDER — METOPROLOL TARTRATE 12.5 MG HALF TABLET
12.5000 mg | ORAL_TABLET | Freq: Two times a day (BID) | ORAL | Status: DC
  Administered 2012-09-15 – 2012-09-16 (×2): 12.5 mg via ORAL
  Filled 2012-09-15 (×3): qty 1

## 2012-09-15 NOTE — Progress Notes (Signed)
Patient ID: [name], male   DOB: 19-May-1935, 77 y.o.   MRN: [id number]    Subjective:  Denies SSCP, palpitations or Dyspnea   Objective:  Filed Vitals:   09/14/12 2000 09/15/12 0000 09/15/12 0359 09/15/12 0734  BP: 130/57 164/79 132/71 151/73  Pulse: 57 89 37 75  Temp:  98.3 F (36.8 C) 98 F (36.7 C) 98.3 F (36.8 C)  TempSrc:  Oral Oral Oral  Resp:  12 13 16   Height:      Weight:   178 lb 9.2 oz (81 kg)   SpO2:  100% 99% 100%    Intake/Output from previous day:  Intake/Output Summary (Last 24 hours) at 09/15/12 0738 Last data filed at 09/15/12 0452  Gross per 24 hour  Intake    960 ml  Output   1501 ml  Net   -541 ml    Physical Exam: Affect appropriate Healthy:  appears stated age HEENT: normal Neck supple with no adenopathy JVP normal no bruits no thyromegaly Lungs clear with no wheezing and good diaphragmatic motion Heart:  S1/S2 no murmur, no rub, gallop or click PMI normal Abdomen: benighn, BS positve, no tenderness, no AAA no bruit.  No HSM or HJR Distal pulses intact with no bruits No edema Neuro non-focal Skin warm and dry No muscular weakness   Lab Results: Basic Metabolic Panel:  Recent Labs  40/98/11 1510 09/15/12 0540  NA 131* 131*  K 4.0 3.9  CL 94* 94*  CO2 27 28  GLUCOSE 128* 96  BUN 10 9  CREATININE 0.86 0.83  CALCIUM 8.6 9.0  MG 1.4* 1.7  PHOS  --  2.7   Liver Function Tests:  Recent Labs  09/12/12 2113 09/15/12 0540  AST 21 18  ALT 13 11  ALKPHOS 56 57  BILITOT 1.6* 1.4*  PROT 6.1 6.1  ALBUMIN 3.1* 3.1*   No results found for this basename: LIPASE, AMYLASE,  in the last 72 hours CBC:  Recent Labs  09/12/12 2113  09/14/12 1000 09/15/12 0540  WBC 5.8  < > 3.4* 3.5*  NEUTROABS 3.2  --  2.0  --   HGB 10.6*  < > 11.2* 10.8*  HCT 29.5*  < > 32.3* 31.4*  MCV 90.8  < > 94.7 96.3  PLT 131*  < > 111* 114*  < > = values in this interval not displayed. Cardiac Enzymes:  Recent Labs  09/12/12 2113  09/13/12 0555 09/13/12 1004  TROPONINI <0.30 <0.30 <0.30    Imaging: No results found.  Cardiac Studies:  ECG:    Telemetry: Afib no long pauses rates this am 60's  Echo: EF 50-55%  Medications:   . aspirin EC  81 mg Oral Daily  . folic acid  1 mg Oral Daily  . heparin  5,000 Units Subcutaneous Q8H  . lisinopril  5 mg Oral Daily  . multivitamin with minerals  1 tablet Oral Daily  . oxybutynin  5 mg Oral QHS  . pantoprazole  40 mg Oral Daily  . simvastatin  20 mg Oral QPM  . sodium chloride  3 mL Intravenous Q12H  . thiamine  100 mg Oral Daily   Or  . thiamine  100 mg Intravenous Daily     . sodium chloride 10 mL/hr (09/13/12 0500)    Assessment/Plan:  Afib:  No anticoagulation per patient.  Beta blocker stopped for bradycardia no indication for pacer Chol:  Continue statin  Charlton Haws 09/15/2012, 7:38 AM

## 2012-09-15 NOTE — Discharge Summary (Signed)
Family [facility] Discharge Summary  Patient name: [name] Medical record number: [id number] Date of birth: [birth year] Age: 77 y.o. Gender: male Date of Admission: 09/12/2012  Date of Discharge: 09/13/2012 Admitting Physician: Janit Pagan, MD  Primary Care Provider: Tobin Chad, MD  Indication for Hospitalization: alcohol intoxication, hyponatremia  Discharge Diagnoses:  Hyponatremia (believed secondary to beer potomania) Hypokalemia Alcohol abuse Atrial fibrillation, left bundle-branch block CAD, HTN, HLD Chronic diastolic heart failure BPH Generalized weakness  Consultations: [facility] (Drs. Jens Som, [place], Hochrein)  Procedures: none  Significant Labs and Imaging:  Admit Na 116, trend to high of 139, stablized by discharge ~131 Admit K 3.1, low of 2.7, stabilized by discharge ~4 CBC generally unremarkable (stable chronic anemia, WBC low end of normal, platelets ~115-130k)  Serum troponins negative x3 Pro-BNP 1584  INR 0.99   Admit blood alcohol 6/14: 241 Urine studies:   UA 6/14: unremarkable  Urine sodium <10 (appropriately low for hyponatremia)  Urine osmoles 116 (appropriately low with serum Osm 276 at the time) Mag 6/17: 1.7 Phos 6/17: 2.7   CXR 6/14: no acute disease; stable cardiomegaly s/p CABG EKG 6/16: no significant change from prior; possible junctional escape rhythm with intermittent atrial fibrillationz  Brief Hospital Course: [name] is a 77 y.o. year old male who presented initially with shortness of breath to [facility] ED. Pt was a generally poor historian and acutely intoxicated on admission, but SOB seemed to have resolved on its own; admit labs significant for hyponatremia and hypokalemia. PMH significant for CAD and MI s/p CABG, atrial fibrillation with bundle branch block and bradycardia, HTN, HLD, and heavy alcohol use with similar presentations to the hospital. CXR was negative for acute process,  and EKG was unchanged (see above). Throughout the hospital course, pt remained afebrile and sodium corrected with only fluid restriction. Pt was covered with CIWA protocol to prevent acute alcohol withdrawal. Cardiology was consulted for concern for bradycardia. See below for details by problem list.  #Hyponatremia - Most likely secondary to both acute and chronic EtOH use and generally poor PO intake (low albumin, plt 131k). Also possible contribution of CHF and HCTZ use for HTN. S/p NS bolus and maintenance fluids at [facility] ED. Pt did not appear grossly fluid-overloaded.  -pt received IV boluses in the ED, but was generally fluid restricted to 1800 mL/day; HCTZ was held -sodium corrected to 131, has persisted there at 131 x several checks now (pt's baseline seems ~130) -urine studies (Na, Osm) appropriate in comparison to serum osmoles  #Hypokalemia - Most likely related to the above. Repleted easily with IV and PO potassium - Mg repleted with 4g total prior to discharge  # Chest pain/SOB - vague chest pain/discomfort prior to presentation - troponins negative x 3 - pt reported no chest pain subsequently during the hospitalization - pt in general did not complain of SOB after initial presentation - pt did have elevated heart rate with some subjective SOB after extensive ambulation prior to discharge, felt to be due to general deconditioning and 2+ days with very little activity in the hospital  #Atrial fibrillation with bradycardia - not on chronic anticoagulation - EKG today remains unchanged on 6/16 but does show junctional escape rhythm - cardiology was consulted 6/16  -consultants recommended d/c of metoprolol due to bradycardia, but stated no indication for pacer  -pt had tachycardia with ambulation to 130's without beta-blocker, so it was started at 12.5 mg BID  -pt ambulated with pulse to 90's-100's  on low-dose metoprolol prior to discharge -pt was felt to be a poor candidate for  anticoagulation other than ASA due to chronic/current alcohol abuse  #CAD, etc - Per pt history; s/p MI x2 and CABG; pt was continued on aspirin 81mg  daily  #HTN - appears relatively well controlled with lisinopril and HCTZ  -pt was continued on home meds, ASA, lisinopril, Zocor -HCTZ was held during the admission and stopped at discharge -metoprolol changes were made as above -pt will need outpt f/u to re-adjust BP meds and/or restart HCTZ  # Leukopenia: noted on CBC's - likely result of chronic alcohol abuse/general poor nutrition; appeared stable/chronic - pt had no indications of infection throughout the hospital admission  #Chronic back pain - at baseline; pt uses tramadol BID PRN, which was continued  #Heavy alcohol use - likely contributing to hyponatremia as above, as well as weakness and general health status  - CSW has already met with pt for hx of alcohol abuse/dependence  - pt was managed with CIWA protocol with seizure and fall precautions - pt did require Ativan once overnight 6/17-6/18 for some confusion/withdrawal symptoms  #Physical conditioning - some SOB/tachycardia with ambulation as above -pt stated he did not desire SNF placement or home health services and felt he could manage himself at home -there was no concern for lack of capacity of pt to make his own decisions, so formal PT eval was requested  Discharge Exam: Temp:  [97.6 F (36.4 C)-99 F (37.2 C)] 98.2 F (36.8 C) (06/18 1100) Pulse Rate:  [47-73] 47 (06/18 1100) Resp:  [11-13] 12 (06/18 1100) BP: (121-151)/(54-72) 121/54 mmHg (06/18 1100) SpO2:  [97 %-99 %] 97 % (06/18 1100) Weight:  [178 lb 9.2 oz (81 kg)] 178 lb 9.2 oz (81 kg) (06/18 0418) Physical Exam:  General: elderly male in NAD, lying in bed; alert/oriented, appropriate to conversation/interview Cardiovascular: heart sounds distant but no definite murmur appreciated Respiratory: CTAB, no wheezes Extremities: no LE edema, calves  nontender  Discharge Medications:    Medication List    STOP taking these medications       hydrochlorothiazide 25 MG tablet  Commonly known as:  HYDRODIURIL      TAKE these medications       aspirin EC 81 MG tablet  Take 81 mg by mouth daily.     Fish Oil 1200 MG Caps  Take 1 capsule by mouth daily.     lisinopril 5 MG tablet  Commonly known as:  PRINIVIL,ZESTRIL  Take 5 mg by mouth daily.     metoprolol tartrate 12.5 mg Tabs  Commonly known as:  LOPRESSOR  Take 0.5 tablets (12.5 mg total) by mouth 2 (two) times daily.     multivitamin with minerals Tabs  Take 1 tablet by mouth daily.     nitroGLYCERIN 0.4 MG SL tablet  Commonly known as:  NITROSTAT  Place 1 tablet (0.4 mg total) under the tongue every 5 (five) minutes x 3 doses as needed. For chest pain.     omeprazole 20 MG capsule  Commonly known as:  PRILOSEC  Take 20 mg by mouth daily.     oxybutynin 5 MG tablet  Commonly known as:  DITROPAN  Take 5 mg by mouth at bedtime.     polyethylene glycol packet  Commonly known as:  MIRALAX / GLYCOLAX  Take 17 g by mouth daily as needed (for constipation).     simvastatin 20 MG tablet  Commonly known as:  ZOCOR  Take  20 mg by mouth every evening.     traMADol 50 MG tablet  Commonly known as:  ULTRAM  Take 1 tablet (50 mg total) by mouth every 8 (eight) hours as needed. For pain       Issues for Follow Up:  1. Alcoholism - Pt uninterested in completely stopping drinking, but met with CSW to discuss cessation resources (AA, intensive outpt detox, residential detox, etc). Pt was appreciative but did not commit to any particular plan--would continue outpt counseling.   2. HTN/Cardiac - Would recommend re-adjustment of hypertension meds as outpt; HCTZ was held indefinitely at discharge and beta blocker reduced as above; pacer is not indicated per cardiologist recommendations. Could consider outpt cardiology referral if needed (seen by Margaret during this  hospitalization).  3. Chronic medications - Beta blocker dose reduced as above; follow up pulse/BP as an outpt. Consider stopping oxybutynin as outpatient due to risk of confusion in elderly pt, especially with known chronic alcohol abuse; could consider Detrol, Vesicare, or Myrbetriq vs f/u with outpt urologist.  Outstanding Results: none  Follow-up Information   Follow up with Felix Pacini, DO On 09/17/2012. (at 3:00pm for hospital follow up)    Contact information:   [address] [place] [address] [phone]      Discharge Condition: Stable  Discharge Disposition: Home  Discharge Instructions: Please refer to Patient Instructions section of EMR for full details.  Patient was counseled important signs and symptoms that should prompt return to medical care, changes in medications, dietary instructions, activity restrictions, and follow up appointments.    Levert Feinstein, MD / Maryjean Ka, MD [facility] Family [facility] PGY-1

## 2012-09-15 NOTE — Progress Notes (Signed)
Family Medicine Teaching Service Daily Progress Note Service Pager: [phone]   Subjective: Overnight patient was confused as to where he was, and was given ativan out of concern for alcohol withdrawal. Denies chest pain or shortness of breath today.  Objective: Temp:  [97.7 F (36.5 C)-98.3 F (36.8 C)] 98.3 F (36.8 C) (06/17 0734) Pulse Rate:  [37-89] 51 (06/17 0800) Resp:  [11-22] 16 (06/17 0800) BP: (108-164)/(39-79) 148/51 mmHg (06/17 0800) SpO2:  [96 %-100 %] 96 % (06/17 0800) Weight:  [178 lb 9.2 oz (81 kg)] 178 lb 9.2 oz (81 kg) (06/17 0359) Exam: General: NAD, sitting on side of bed completely naked, eating breakfast Cardiovascular: heart sounds distant Respiratory: normal respiratory effort, fine crackles remain present at the bases GU: condom cath in place Extremities: no appreciable lower extremity edema bilaterally, calves nontender to palpation  I have reviewed the patient's medications, labs, imaging, and diagnostic testing.  Notable results are summarized below.  Medications:  Scheduled Meds: . aspirin EC  81 mg Oral Daily  . folic acid  1 mg Oral Daily  . heparin  5,000 Units Subcutaneous Q8H  . lisinopril  5 mg Oral Daily  . multivitamin with minerals  1 tablet Oral Daily  . oxybutynin  5 mg Oral QHS  . pantoprazole  40 mg Oral Daily  . simvastatin  20 mg Oral QPM  . sodium chloride  3 mL Intravenous Q12H  . thiamine  100 mg Oral Daily   Or  . thiamine  100 mg Intravenous Daily   Continuous Infusions: . sodium chloride 10 mL/hr (09/13/12 0500)   PRN Meds:.LORazepam, LORazepam, ondansetron (ZOFRAN) IV, ondansetron, polyethylene glycol, traMADol  Labs:  CBC  Recent Labs Lab 09/13/12 0555 09/14/12 1000 09/15/12 0540  WBC 2.9* 3.4* 3.5*  HGB 11.0* 11.2* 10.8*  HCT 30.2* 32.3* 31.4*  PLT 115* 111* 114*     CMET  Recent Labs Lab 09/12/12 2113  09/14/12 0450 09/14/12 1510 09/15/12 0540  NA 116*  < > 131* 131* 131*  K 3.1*  < > 3.4* 4.0  3.9  CL 78*  < > 94* 94* 94*  CO2 25  < > 30 27 28   BUN 10  < > 11 10 9   CREATININE 0.67  < > 0.99 0.86 0.83  GLUCOSE 89  < > 94 128* 96  CALCIUM 8.6  < > 8.7 8.6 9.0  AST 21  --   --   --  18  ALT 13  --   --   --  11  ALKPHOS 56  --   --   --  57  PROT 6.1  --   --   --  6.1  ALBUMIN 3.1*  --   --   --  3.1*  < > = values in this interval not displayed.    Pro-BNP 1584 INR 0.99 Urine sodium <10 Urine osmoles 116 Serum osmoles 276  Mag 1.7 6/17 Phos 2.7 6/17  Assessment/Plan:  [name] is a 77 y.o. year old male presenting initially with shortness of breath, acute intoxication, and hyponatremia. PMH significant for CAD and MI s/p CABG, atrial fibrillation with bundle branch block and bradycardia, HTN, HLD, and heavy alcohol use ans similar presentations to the hospital. Generally poor historian, but SOB seemed to have resolved on its own; labs significant for hyponatremia and hypokalemia. CXR negative for acute process, EKG unchanged, VSS.   #Hyponatremia - Most likely secondary to both acute and chronic EtOH use and  generally poor PO intake (low albumin, plt 131k). Also possible contribution of CHF and HCTZ use for HTN. S/p NS bolus and maintenance fluids at [facility] ED. Pt does not appear grossly fluid-overloaded.  -continue KVO IV fluids and fluid restriction to 1800 mL/day  -hold HCTZ  - sodium corrected to 131, has persisted there at 131 x several checks now - urine studies (Na, Osm) appropriate in comparison to serum osmoles  #Hypokalemia - Most likely related to the above. S/p repletion - Mg repleted with 4g total yesterday  # Chest pain - vague chest pain/discomfort prior to presentation - troponins negative x 3  #Atrial fibrillation with bradycardia - not on chronic anticoagulation - EKG today remains unchanged on 6/16 but does show junctional escape rhythm - cardiology consulted yesterday and d/c'd metoprolol due to bradycardia, rec no indication for  pacer  #CAD, etc - Per pt history; s/p MI x2 and CABG  - continue aspirin 81mg  daily  #HTN - appears relatively well controlled with lisinopril and HCTZ  -continue home meds, ASA, lisinopril, Zocor; holding HCTZ as above  -metoprolol d/c'd by cards on 6/16 secondary to bradycardia - restart HCTZ today now that hyponatremia improved  # Leukopenia: noted on CBC yesterday, remains leukopenic today - likely result of chronic alcohol abuse  #Chronic back pain - at baseline, pt uses tramadol BID PRN, will continue here   #Heavy alcohol use - likely contributing to hyponatremia as above, as well as weakness and general health status  - CSW has already met with pt for hx of alcohol abuse/dependence  [ ]  CIWA protocol with seizure and fall precautions   FEN/GI: heart-healthy diet with 1800 mL fluid restriction, KVO IVF   Prophylaxis: subQ heparin   Disposition: possible d/c today  Code Status: Full   Levert Feinstein, MD [facility] Medicine PGY-1 Service Pager [phone]

## 2012-09-15 NOTE — Progress Notes (Signed)
Pt at 0000 appeared confused, unaware of his surroundings, where he was any why. He was not combative, aggressive or irate. MD on call notified and ordered to give PRN 1mg  ativan.

## 2012-09-16 DIAGNOSIS — Z8546 Personal history of malignant neoplasm of prostate: Secondary | ICD-10-CM

## 2012-09-16 DIAGNOSIS — I5032 Chronic diastolic (congestive) heart failure: Secondary | ICD-10-CM

## 2012-09-16 MED ORDER — METOPROLOL TARTRATE 12.5 MG HALF TABLET: 12.5000 mg | ORAL_TABLET | Freq: Two times a day (BID) | ORAL | Status: DC

## 2012-09-16 NOTE — Progress Notes (Signed)
Patient is discharged to home in stable condition.  Patient's friend, Raiford Noble, happened to visit and offered him a ride.  Discharged instructions and follow-up appointment given to patient. He verbalized understanding.

## 2012-09-16 NOTE — Progress Notes (Signed)
FMTS Attending Daily Note: Blannie Shedlock MD [phone] pager office [phone] I  have seen and examined this patient, reviewed their chart. I have discussed this patient with the resident. I agree with the resident's findings, assessment and care plan. 

## 2012-09-16 NOTE — Progress Notes (Addendum)
Ambulated patient in the hallway, O2 sats maintaining in the high 90's,  Heart rate between 106 to 130's. He started to complaint of shortness of breath.   Assisted him back to bed.  No other complaints made.

## 2012-09-16 NOTE — Progress Notes (Signed)
   SUBJECTIVE:  Denies pain or SOB   PHYSICAL EXAM Filed Vitals:   09/16/12 0418 09/16/12 0700 09/16/12 0901 09/16/12 0902  BP: 138/54 134/62 151/72   Pulse: 62 60  73  Temp: 97.6 F (36.4 C) 99 F (37.2 C)    TempSrc: Oral Oral    Resp: 12 13    Height:  5\' 11"  (1.803 m)    Weight: 178 lb 9.2 oz (81 kg)     SpO2: 97% 97%     General:  No distress Lungs:  Few basilar crackles Heart:  Irregular Abdomen:  Positive bowel sounds, no rebound no guarding Extremities:  No edema  LABS:  No results found for this or any previous visit (from the past 24 hour(s)).  Intake/Output Summary (Last 24 hours) at 09/16/12 0934 Last data filed at 09/16/12 0902  Gross per 24 hour  Intake    483 ml  Output   1450 ml  Net   -967 ml    EKG:    ASSESSMENT AND PLAN:  Bradycardia:   Heart rate seems to be better on lower dose of beta blocker.  No change in therapy.     HYPERTENSION, BENIGN SYSTEMIC:  Blood pressure up slightly this AM.  Increase lisinopril as needed for BP control.    CORONARY ARTERY DISEASE:  No active evidence of ischemia.  No further work.    ATRIAL FIBRILLATION:  Not an anticoagulation candidate with reasons outlined in the consult note.    Fayrene Fearing [facility] 09/16/2012 9:34 AM

## 2012-09-17 ENCOUNTER — Ambulatory Visit: Payer: Medicare Other | Admitting: Family Medicine

## 2012-09-18 NOTE — Discharge Summary (Signed)
Family Medicine Teaching Service  Discharge Note : Attending Sara Neal MD Pager [phone] Office [phone] I have seen and examined this patient, reviewed their chart and discussed discharge planning wit the resident at the time of discharge. I agree with the discharge plan as above.  

## 2012-09-23 ENCOUNTER — Inpatient Hospital Stay (HOSPITAL_COMMUNITY)
Admission: EM | Admit: 2012-09-23 | Discharge: 2012-09-29 | DRG: 897 | Disposition: A | Discharge status: Home / Self Care | Payer: Medicare Other | Attending: Family Medicine | Admitting: Family Medicine

## 2012-09-23 ENCOUNTER — Encounter (HOSPITAL_COMMUNITY): Payer: Self-pay | Admitting: Emergency Medicine

## 2012-09-23 ENCOUNTER — Emergency Department (HOSPITAL_COMMUNITY): Payer: Medicare Other

## 2012-09-23 DIAGNOSIS — I10 Essential (primary) hypertension: Secondary | ICD-10-CM | POA: Diagnosis present

## 2012-09-23 DIAGNOSIS — E785 Hyperlipidemia, unspecified: Secondary | ICD-10-CM | POA: Diagnosis present

## 2012-09-23 DIAGNOSIS — Z8546 Personal history of malignant neoplasm of prostate: Secondary | ICD-10-CM

## 2012-09-23 DIAGNOSIS — F1020 Alcohol dependence, uncomplicated: Principal | ICD-10-CM | POA: Diagnosis present

## 2012-09-23 DIAGNOSIS — I252 Old myocardial infarction: Secondary | ICD-10-CM

## 2012-09-23 DIAGNOSIS — D539 Nutritional anemia, unspecified: Secondary | ICD-10-CM

## 2012-09-23 DIAGNOSIS — Z951 Presence of aortocoronary bypass graft: Secondary | ICD-10-CM

## 2012-09-23 DIAGNOSIS — G8929 Other chronic pain: Secondary | ICD-10-CM | POA: Diagnosis present

## 2012-09-23 DIAGNOSIS — Z9181 History of falling: Secondary | ICD-10-CM

## 2012-09-23 DIAGNOSIS — Z7982 Long term (current) use of aspirin: Secondary | ICD-10-CM

## 2012-09-23 DIAGNOSIS — R079 Chest pain, unspecified: Secondary | ICD-10-CM

## 2012-09-23 DIAGNOSIS — I5032 Chronic diastolic (congestive) heart failure: Secondary | ICD-10-CM

## 2012-09-23 DIAGNOSIS — I447 Left bundle-branch block, unspecified: Secondary | ICD-10-CM

## 2012-09-23 DIAGNOSIS — I2510 Atherosclerotic heart disease of native coronary artery without angina pectoris: Secondary | ICD-10-CM

## 2012-09-23 DIAGNOSIS — M549 Dorsalgia, unspecified: Secondary | ICD-10-CM | POA: Diagnosis present

## 2012-09-23 DIAGNOSIS — E871 Hypo-osmolality and hyponatremia: Secondary | ICD-10-CM

## 2012-09-23 DIAGNOSIS — I498 Other specified cardiac arrhythmias: Secondary | ICD-10-CM | POA: Diagnosis not present

## 2012-09-23 DIAGNOSIS — I4891 Unspecified atrial fibrillation: Secondary | ICD-10-CM

## 2012-09-23 DIAGNOSIS — R45851 Suicidal ideations: Secondary | ICD-10-CM

## 2012-09-23 DIAGNOSIS — F3289 Other specified depressive episodes: Secondary | ICD-10-CM | POA: Diagnosis present

## 2012-09-23 DIAGNOSIS — Z79899 Other long term (current) drug therapy: Secondary | ICD-10-CM

## 2012-09-23 DIAGNOSIS — F329 Major depressive disorder, single episode, unspecified: Secondary | ICD-10-CM | POA: Diagnosis present

## 2012-09-23 DIAGNOSIS — E876 Hypokalemia: Secondary | ICD-10-CM | POA: Diagnosis present

## 2012-09-23 DIAGNOSIS — F1010 Alcohol abuse, uncomplicated: Secondary | ICD-10-CM

## 2012-09-23 LAB — CBC
HCT: 30.3 % — ABNORMAL LOW (ref 39.0–52.0)
MCH: 33.2 pg (ref 26.0–34.0)
Platelets: 181 10*3/uL (ref 150–400)
RDW: 12.2 % (ref 11.5–15.5)
WBC: 5.7 10*3/uL (ref 4.0–10.5)

## 2012-09-23 LAB — COMPREHENSIVE METABOLIC PANEL
ALT: 15 U/L (ref 0–53)
AST: 29 U/L (ref 0–37)
Albumin: 3.2 g/dL — ABNORMAL LOW (ref 3.5–5.2)
Alkaline Phosphatase: 49 U/L (ref 39–117)
BUN: 11 mg/dL (ref 6–23)
Chloride: 82 mEq/L — ABNORMAL LOW (ref 96–112)
Potassium: 3.2 mEq/L — ABNORMAL LOW (ref 3.5–5.1)
Sodium: 122 mEq/L — ABNORMAL LOW (ref 135–145)
Total Bilirubin: 1.7 mg/dL — ABNORMAL HIGH (ref 0.3–1.2)

## 2012-09-23 LAB — BASIC METABOLIC PANEL
Calcium: 8.5 mg/dL (ref 8.4–10.5)
GFR calc non Af Amer: 90 mL/min — ABNORMAL LOW (ref >90)
Sodium: 123 mEq/L — ABNORMAL LOW (ref 135–145)

## 2012-09-23 LAB — TROPONIN I: Troponin I: <0.3 ng/mL (ref <0.30)

## 2012-09-23 LAB — POCT I-STAT TROPONIN I: Troponin i, poc: 0 ng/mL (ref 0.00–0.08)

## 2012-09-23 MED ORDER — ADULT MULTIVITAMIN W/MINERALS CH
1.0000 | ORAL_TABLET | Freq: Every day | ORAL | Status: DC
  Administered 2012-09-24 – 2012-09-29 (×6): 1 via ORAL
  Filled 2012-09-23 (×6): qty 1

## 2012-09-23 MED ORDER — SIMVASTATIN 20 MG PO TABS
20.0000 mg | ORAL_TABLET | Freq: Every evening | ORAL | Status: DC
  Administered 2012-09-24 – 2012-09-28 (×5): 20 mg via ORAL
  Filled 2012-09-23 (×7): qty 1

## 2012-09-23 MED ORDER — SODIUM CHLORIDE 0.9 % IJ SOLN
3.0000 mL | Freq: Two times a day (BID) | INTRAMUSCULAR | Status: DC
  Administered 2012-09-24 – 2012-09-28 (×7): 3 mL via INTRAVENOUS

## 2012-09-23 MED ORDER — FOLIC ACID 1 MG PO TABS
1.0000 mg | ORAL_TABLET | Freq: Every day | ORAL | Status: DC
  Administered 2012-09-24 – 2012-09-29 (×6): 1 mg via ORAL
  Filled 2012-09-23 (×6): qty 1

## 2012-09-23 MED ORDER — MAGNESIUM SULFATE 40 MG/ML IJ SOLN
2.0000 g | Freq: Once | INTRAMUSCULAR | Status: AC
  Administered 2012-09-23: 2 g via INTRAVENOUS
  Filled 2012-09-23: qty 50

## 2012-09-23 MED ORDER — VITAMIN B-1 100 MG PO TABS
100.0000 mg | ORAL_TABLET | Freq: Every day | ORAL | Status: DC
  Administered 2012-09-24 – 2012-09-29 (×6): 100 mg via ORAL
  Filled 2012-09-23 (×6): qty 1

## 2012-09-23 MED ORDER — THIAMINE HCL 100 MG/ML IJ SOLN
100.0000 mg | Freq: Every day | INTRAMUSCULAR | Status: DC
  Filled 2012-09-23 (×5): qty 1

## 2012-09-23 MED ORDER — LORAZEPAM 1 MG PO TABS: 1.0000 mg | ORAL_TABLET | Freq: Four times a day (QID) | ORAL | Status: AC | PRN

## 2012-09-23 MED ORDER — SODIUM CHLORIDE 0.9 % IJ SOLN: 3.0000 mL | INTRAMUSCULAR | Status: DC | PRN

## 2012-09-23 MED ORDER — LORAZEPAM 2 MG/ML IJ SOLN: 1.0000 mg | Freq: Four times a day (QID) | INTRAMUSCULAR | Status: AC | PRN

## 2012-09-23 MED ORDER — METOPROLOL TARTRATE 12.5 MG HALF TABLET
12.5000 mg | ORAL_TABLET | Freq: Every day | ORAL | Status: DC
  Administered 2012-09-24 – 2012-09-25 (×2): 12.5 mg via ORAL
  Filled 2012-09-23 (×2): qty 1

## 2012-09-23 MED ORDER — HEPARIN SODIUM (PORCINE) 5000 UNIT/ML IJ SOLN
5000.0000 [IU] | Freq: Three times a day (TID) | INTRAMUSCULAR | Status: DC
  Administered 2012-09-24 – 2012-09-29 (×17): 5000 [IU] via SUBCUTANEOUS
  Filled 2012-09-23 (×20): qty 1

## 2012-09-23 MED ORDER — ASPIRIN EC 81 MG PO TBEC
81.0000 mg | DELAYED_RELEASE_TABLET | Freq: Every day | ORAL | Status: DC
  Administered 2012-09-24 – 2012-09-29 (×6): 81 mg via ORAL
  Filled 2012-09-23 (×6): qty 1

## 2012-09-23 MED ORDER — POTASSIUM CHLORIDE CRYS ER 20 MEQ PO TBCR
40.0000 meq | EXTENDED_RELEASE_TABLET | Freq: Once | ORAL | Status: AC
  Administered 2012-09-23: 40 meq via ORAL
  Filled 2012-09-23: qty 2

## 2012-09-23 MED ORDER — SODIUM CHLORIDE 0.9 % IV SOLN: 250.0000 mL | INTRAVENOUS | Status: DC | PRN

## 2012-09-23 MED ORDER — SODIUM CHLORIDE 0.9 % IV SOLN
INTRAVENOUS | Status: DC
  Administered 2012-09-23: 20:00:00 via INTRAVENOUS
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
## 2012-09-23 NOTE — ED Notes (Signed)
X-ray at bedside

## 2012-09-23 NOTE — ED Notes (Addendum)
Pt to ED via [facility] for evaluation of chest pain.  Chest pain began at 11am this morning described as pressure, denies radiation of pain.  Pt admits to drinking alcohol today, began drinking before chest pain began, admits to beer and liquor intake today.  Pt a-fib on monitor, hx of same.  Alert and oriented X4,  IV in place.  Pt given 324 ASA and 1 Nitro with EMS, denies any pain upon arrival to ED.
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
## 2012-09-23 NOTE — ED Notes (Signed)
Attempted report 

## 2012-09-24 DIAGNOSIS — F329 Major depressive disorder, single episode, unspecified: Secondary | ICD-10-CM

## 2012-09-24 LAB — BASIC METABOLIC PANEL
BUN: 9 mg/dL (ref 6–23)
BUN: 10 mg/dL (ref 6–23)
CO2: 22 mEq/L (ref 19–32)
Calcium: 8.3 mg/dL — ABNORMAL LOW (ref 8.4–10.5)
Calcium: 8.5 mg/dL (ref 8.4–10.5)
Calcium: 8.7 mg/dL (ref 8.4–10.5)
Chloride: 89 mEq/L — ABNORMAL LOW (ref 96–112)
Chloride: 88 mEq/L — ABNORMAL LOW (ref 96–112)
Creatinine, Ser: 0.69 mg/dL (ref 0.50–1.35)
Creatinine, Ser: 0.7 mg/dL (ref 0.50–1.35)
GFR calc Af Amer: >90 mL/min (ref >90)
GFR calc Af Amer: >90 mL/min (ref >90)
GFR calc Af Amer: >90 mL/min (ref >90)
GFR calc Af Amer: >90 mL/min (ref >90)
GFR calc Af Amer: >90 mL/min (ref >90)
GFR calc non Af Amer: >90 mL/min (ref >90)
GFR calc non Af Amer: >90 mL/min (ref >90)
Potassium: 3.4 mEq/L — ABNORMAL LOW (ref 3.5–5.1)
Potassium: 4.1 mEq/L (ref 3.5–5.1)
Sodium: 126 mEq/L — ABNORMAL LOW (ref 135–145)
Sodium: 128 mEq/L — ABNORMAL LOW (ref 135–145)
Sodium: 129 mEq/L — ABNORMAL LOW (ref 135–145)

## 2012-09-24 LAB — TROPONIN I
Troponin I: <0.3 ng/mL (ref <0.30)
Troponin I: <0.3 ng/mL (ref <0.30)

## 2012-09-24 LAB — CBC
Platelets: 141 10*3/uL — ABNORMAL LOW (ref 150–400)
RBC: 3.32 MIL/uL — ABNORMAL LOW (ref 4.22–5.81)
WBC: 3.6 10*3/uL — ABNORMAL LOW (ref 4.0–10.5)

## 2012-09-24 LAB — MRSA PCR SCREENING: MRSA by PCR: NEGATIVE

## 2012-09-24 LAB — OSMOLALITY, URINE: Osmolality, Ur: 284 mOsm/kg — ABNORMAL LOW (ref 390–1090)

## 2012-09-24 LAB — OSMOLALITY: Osmolality: 288 mOsm/kg (ref 275–300)

## 2012-09-24 MED ORDER — POTASSIUM CHLORIDE CRYS ER 20 MEQ PO TBCR
40.0000 meq | EXTENDED_RELEASE_TABLET | Freq: Once | ORAL | Status: AC
  Administered 2012-09-24: 40 meq via ORAL
  Filled 2012-09-24: qty 2

## 2012-09-24 NOTE — Care Management Note (Addendum)
    Page 1 of 2   09/29/2012     3:56:22 PM   CARE MANAGEMENT NOTE 09/29/2012  Patient:  [name], [name]   Account Number:  [id number]  Date Initiated:  09/24/2012  Documentation initiated by:  Junius Creamer  Subjective/Objective Assessment:   hyponatremia     Action/Plan:   lives alone, pcp dr Chrissie Noa hale   Anticipated DC Date:  09/28/2012   Anticipated DC Plan:  HOME W HOME HEALTH SERVICES  In-house referral  Clinical Social Worker      DC Associate Professor  CM consult      [facility] Choice  HOME HEALTH   Choice offered to / List presented to:  [name]        HH arranged  HH-2 PT  HH-3 OT      [facility] agency  [facility]   Status of service:  Completed, signed off Medicare Important Message given?   (If response is "NO", the following Medicare IM given date fields will be blank) Date Medicare IM given:   Date Additional Medicare IM given:    Discharge Disposition:  HOME W HOME HEALTH SERVICES  Per UR Regulation:  Reviewed for med. necessity/level of care/duration of stay  If discussed at Long Length of Stay Meetings, dates discussed:   09/29/2012    Comments:  09/29/12 Marcus Tejera,RN,BSN [phone] PT FOR DC HOME TODAY.  WILL ADD HHOT TO ORDERS. [facility] AWARE OF DC DATE.  09/27/2012 1020 NCM spoke pt and he had [facility] in the past. Offered choice for [facility]. Pt requesting [facility]. Faxed referral to [facility]. Pt has cane and RW at home. Isidoro Donning RN CCM Case Mgmt phone [phone]  Contact:  Scherrie Bateman [phone]   [phone]                 Edwards,Cindy Niece [phone]

## 2012-09-24 NOTE — Progress Notes (Signed)
Family Medicine Teaching Service Daily Progress Note Service Pager: [phone]   Subjective: pt denies being in pain or feeling SOB this morning. States he would like to stop drinking and would like help with stopping.  Objective: Temp:  [97.9 F (36.6 C)-98.4 F (36.9 C)] 98.4 F (36.9 C) (06/26 0800) Pulse Rate:  [51-68] 68 (06/26 0800) Resp:  [11-18] 18 (06/26 0800) BP: (110-159)/(43-67) 159/66 mmHg (06/26 0800) SpO2:  [95 %-100 %] 97 % (06/26 0800) Weight:  [175 lb 7.8 oz (79.6 kg)] 175 lb 7.8 oz (79.6 kg) (06/26 0420) Exam: General: NAD Cardiovascular: irregularly irregular rhythm Respiratory: CTAB, NWOB Abdomen: soft, nontender to palpation Extremities: calves nontender to palpation Neuro: grossly nonfocal, speech clearer than upon admission  I have reviewed the patient's medications, labs, imaging, and diagnostic testing.  Notable results are summarized below.  Medications:  Scheduled Meds: . aspirin EC  81 mg Oral Daily  . folic acid  1 mg Oral Daily  . heparin  5,000 Units Subcutaneous Q8H  . metoprolol tartrate  12.5 mg Oral Daily  . multivitamin with minerals  1 tablet Oral Daily  . simvastatin  20 mg Oral QPM  . sodium chloride  3 mL Intravenous Q12H  . thiamine  100 mg Oral Daily   Or  . thiamine  100 mg Intravenous Daily   Continuous Infusions:  PRN Meds:.sodium chloride, LORazepam, LORazepam, sodium chloride  Labs:  CBC  Recent Labs Lab 09/23/12 1832 09/24/12 0740  WBC 5.7 3.6*  HGB 11.2* 11.1*  HCT 30.3* 30.1*  PLT 181 141*     BMET  Recent Labs Lab 09/23/12 2305 09/24/12 0350 09/24/12 0740  NA 123* 126* 129*  K 3.7 3.4* 3.8  CL 84* 87* 89*  CO2 24 27 24   BUN 10 9 8   CREATININE 0.72 0.69 0.69  GLUCOSE 59* 78 64*  CALCIUM 8.5 8.4 8.3*        Assessment/Plan:  [name] is a 77 y.o. year old male with hx of CAD and MI s/p CABG, atrial fibrillation with bundle branch block and bradycardia, HTN, HLD, and heavy alcohol use,  presenting with chest pain.   # Chest pain - likely more anxiety related although hx is difficult to obtain. Does have significant cardiac hx. EKG reportedly unchanged per ED physician.  - EKG this morning shows accelerated ventricular rhythm, some signs of heart strain (ST depression) but troponins negative x 2 and pt denies being in pain currently - continue aspirin 81mg  daily  - will defer cardiology consult as in past patient has not wanted pacemaker  # Hyponatremia - Most likely secondary to beer potomania. Pt with clear signs of alcohol intoxication (286 mg/dl). Moderate Hyponatremia (Na 121 without neurologic symptoms) Goal for Na correction in 24h is 4-6 meq/l.  - has corrected to 129 most recently - fluid restrict  - q8h BMETs  # Hypokalemia  - resolved s/p repletion  # Atrial fibrillation with bradycardia - not on chronic anticoagulation due to alcohol use  - continue home metoprolol.  - monitor on telemetry   # HTN - normotensive currently  - continue metoprolol. Lisinopril held due to borderline low BP.   # Chronic back pain  - hold home tramadol while intoxicated   # Heavy alcohol use - likely cause of hyponatremia (beer potomania)  - monitor closely in stepdown unit  - thiamine, folate, MVI  - CIWA protocol with seizure and fall precautions  - consult social work for possible inpatient alcohol  rehab  # Depression/suicidal ideation:  - pt now denies SI/HI  # HLD: continue statin   FEN/GI: heart-healthy diet with 1800 mL fluid restriction, KVO IVF   Prophylaxis: subQ heparin   Disposition: Pending improvement.   Code Status: Full   Marcus Feinstein, MD [facility] PGY-1 Service Pager [phone]

## 2012-09-24 NOTE — H&P (Signed)
Seen and examined.  Discussed with Dr. Aviva Signs.  Agree with the admit and her documentation and management.  Briefly, The history is a little different, better now with the light of morning and Mr. Langsam not being intoxicated.  Today, he denies any current chest pain and minimizes any pain he had yesterday.  The admit is really about a depressed, elderly male who lives alone, has minimal to no social interactions (he does have a cat) and drinks too much.  In my discussions with him this morning, he is not actively suicidal - he just doesn't have much to live for.  He has capacity and states he intends to return home.  He did not show any interest in decreasing his alcohol use.  The one concession I did get was him saying "I probably ought to start going to church regularly."  I am pessimistic that we will be able to change this pattern of behavior.  Still, we should open doors.for alcohol treatment, watch for withdrawal, correct electrolytes and check his functional status with PT and OT referrals.

## 2012-09-24 NOTE — Progress Notes (Signed)
FMTS Attending Daily Note:  Renold Don MD  [phone] pager  Family Practice pager:  [phone] I have discussed this patient with the resident Dr. Pollie Meyer and attending physician Dr. Leveda Anna.  I agree with their findings, assessment, and care plan

## 2012-09-25 ENCOUNTER — Encounter (HOSPITAL_COMMUNITY): Payer: Self-pay | Admitting: Cardiology

## 2012-09-25 DIAGNOSIS — I10 Essential (primary) hypertension: Secondary | ICD-10-CM

## 2012-09-25 DIAGNOSIS — I2510 Atherosclerotic heart disease of native coronary artery without angina pectoris: Secondary | ICD-10-CM

## 2012-09-25 DIAGNOSIS — I4891 Unspecified atrial fibrillation: Secondary | ICD-10-CM

## 2012-09-25 DIAGNOSIS — I498 Other specified cardiac arrhythmias: Secondary | ICD-10-CM

## 2012-09-25 DIAGNOSIS — D539 Nutritional anemia, unspecified: Secondary | ICD-10-CM

## 2012-09-25 LAB — BASIC METABOLIC PANEL
CO2: 27 mEq/L (ref 19–32)
Calcium: 8.5 mg/dL (ref 8.4–10.5)
Chloride: 91 mEq/L — ABNORMAL LOW (ref 96–112)
Potassium: 4.1 mEq/L (ref 3.5–5.1)
Sodium: 130 mEq/L — ABNORMAL LOW (ref 135–145)

## 2012-09-25 LAB — CBC
HCT: 29.9 % — ABNORMAL LOW (ref 39.0–52.0)
Hemoglobin: 10.5 g/dL — ABNORMAL LOW (ref 13.0–17.0)
MCV: 93.4 fL (ref 78.0–100.0)
Platelets: 129 10*3/uL — ABNORMAL LOW (ref 150–400)
RBC: 3.2 MIL/uL — ABNORMAL LOW (ref 4.22–5.81)
WBC: 3.5 10*3/uL — ABNORMAL LOW (ref 4.0–10.5)

## 2012-09-25 MED ORDER — LISINOPRIL 5 MG PO TABS
5.0000 mg | ORAL_TABLET | Freq: Every day | ORAL | Status: DC
  Administered 2012-09-26 – 2012-09-29 (×4): 5 mg via ORAL
  Filled 2012-09-25 (×4): qty 1
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
## 2012-09-25 NOTE — Progress Notes (Signed)
Family Medicine Teaching Service Daily Progress Note Service Pager: [phone]   Subjective: Pt denies chest pain or SOB today. Does have some low back pain from lying in bed. Ate good breakfast this AM. Denies SI/HI, says that was most likely from intoxication. Willing to meet with CSW today to discuss alcohol rehab.  Objective: Temp:  [98.2 F (36.8 C)-98.9 F (37.2 C)] 98.5 F (36.9 C) (06/27 0800) Pulse Rate:  [53-76] 53 (06/27 0515) Resp:  [9-20] 18 (06/27 0515) BP: (110-158)/(42-99) 127/50 mmHg (06/27 0800) SpO2:  [96 %-100 %] 100 % (06/27 0515) Weight:  [173 lb 4.5 oz (78.6 kg)] 173 lb 4.5 oz (78.6 kg) (06/27 0515) Exam: General: NAD Cardiovascular: irregularly irregular rhythm Respiratory: CTAB, NWOB Abdomen: soft, nontender to palpation Extremities: calves nontender to palpation. Bruising prominent on arms. Neuro: grossly nonfocal, speech clear, mentation clear  I have reviewed the patient's medications, labs, imaging, and diagnostic testing.  Notable results are summarized below.  Medications:  Scheduled Meds: . aspirin EC  81 mg Oral Daily  . folic acid  1 mg Oral Daily  . heparin  5,000 Units Subcutaneous Q8H  . metoprolol tartrate  12.5 mg Oral Daily  . multivitamin with minerals  1 tablet Oral Daily  . simvastatin  20 mg Oral QPM  . sodium chloride  3 mL Intravenous Q12H  . thiamine  100 mg Oral Daily   Or  . thiamine  100 mg Intravenous Daily   Continuous Infusions:  PRN Meds:.sodium chloride, LORazepam, LORazepam, sodium chloride  Labs:  CBC  Recent Labs Lab 09/23/12 1832 09/24/12 0740 09/25/12 0229  WBC 5.7 3.6* 3.5*  HGB 11.2* 11.1* 10.5*  HCT 30.3* 30.1* 29.9*  PLT 181 141* 129*     BMET  Recent Labs Lab 09/24/12 1347 09/24/12 1836 09/25/12 0229  NA 130* 129* 130*  K 4.3 4.1 4.1  CL 88* 88* 91*  CO2 26 22 27   BUN 10 10 12   CREATININE 0.70 0.67 0.78  GLUCOSE 76 72 75  CALCIUM 8.7 8.6 8.5        Assessment/Plan:  [name] is a 77 y.o. year old male with hx of CAD and MI s/p CABG, atrial fibrillation with bundle branch block and bradycardia, HTN, HLD, and heavy alcohol use, presenting with chest pain.   # Chest pain - likely more anxiety related although hx is difficult to obtain. Does have significant cardiac hx. EKG reportedly unchanged per ED physician.  - EKG on 6/26 showed accelerated ventricular rhythm, some signs of heart strain (ST depression) but troponins negative x 3 and pt denies being in pain currently - continue aspirin 81mg  daily  - per pt's discussion with Dr. Gwendolyn Grant, would potentially be willing to have a pacemaker placed if it was necessary [ ]  check EKG again today. If still shows accelerated ventricular rhythm, consult cardiology  # Hyponatremia - Most likely secondary to beer potomania. Pt with clear signs of alcohol intoxication (286 mg/dl). Moderate Hyponatremia (Na 121 without neurologic symptoms) Goal for Na correction in 24h is 4-6 meq/l.  - Na now 130 - check BMET daily  # Hypokalemia  - resolved s/p repletion  # Atrial fibrillation with bradycardia - not on chronic anticoagulation due to alcohol use  - continue home metoprolol.  - monitor on telemetry   # HTN - normotensive currently  - continue metoprolol. Lisinopril held due to borderline low BP.   # Chronic back pain  - restart home tramadol today  #  Heavy alcohol use - likely cause of hyponatremia (beer potomania)  - monitor closely in stepdown unit  - thiamine, folate, MVI  - CIWA protocol with seizure and fall precautions, score 0 thus far, no ativan required - consult social work for possible inpatient alcohol rehab  # Depression/suicidal ideation:  - pt now denies SI/HI  # HLD: continue statin   FEN/GI: heart-healthy diet with 1800 mL fluid restriction, KVO IVF   Prophylaxis: subQ heparin (watch platelets since chronically runs low)  Disposition: Pending improvement & CSW consultation, possibly to  inpatient rehab for alcohol abuse - transfer to telemetry [ ]  PT/OT consult today  Code Status: Full   Levert Feinstein, MD [facility] Medicine PGY-1 Service Pager [phone]

## 2012-09-25 NOTE — Progress Notes (Signed)
FMTS Attending Daily Note:  Renold Don MD  [phone] pager  Family Practice pager:  [phone] I have seen and examined this patient and have reviewed their chart. I have discussed this patient with the resident. I agree with the resident's findings, assessment and care plan.  Additionally: - Patient doing well today, having bath at time of my examination.   - Heart irregular rhythm, regular rate.  Regular pulse (70s)  Imp/Plan: 1.  Chronic EtOHism:  - not interested in rehab - IS interested in possible outpt treatment. Has "sworn off alcohol."  Awaiting social to see  2.  Cardiac: - Appreciate cardiology input - EKGs have been read in past as A fib.   - Question for cardiology after manual read of EKGs -- is this truly A fib with slow ventricular response, or an accelerated idioventricular rhythm (wide QRS, inverted T, etc)?  May change management pending diagnosis - Will watch off beta blocker  3.  Dispo: - ok to transfer from [place].  CIWA scores have been zero.    Tobey Grim, MD 09/25/2012 1:22 PM

## 2012-09-25 NOTE — OR Nursing (Signed)
Report called to Angela, RN.

## 2012-09-26 DIAGNOSIS — I2510 Atherosclerotic heart disease of native coronary artery without angina pectoris: Secondary | ICD-10-CM

## 2012-09-26 DIAGNOSIS — I447 Left bundle-branch block, unspecified: Secondary | ICD-10-CM

## 2012-09-26 DIAGNOSIS — Z9181 History of falling: Secondary | ICD-10-CM

## 2012-09-26 DIAGNOSIS — I5032 Chronic diastolic (congestive) heart failure: Secondary | ICD-10-CM

## 2012-09-26 LAB — CBC
HCT: 29.4 % — ABNORMAL LOW (ref 39.0–52.0)
MCV: 93.6 fL (ref 78.0–100.0)
RDW: 12.5 % (ref 11.5–15.5)
WBC: 3.1 10*3/uL — ABNORMAL LOW (ref 4.0–10.5)

## 2012-09-26 LAB — BASIC METABOLIC PANEL
CO2: 27 mEq/L (ref 19–32)
Chloride: 96 mEq/L (ref 96–112)
Creatinine, Ser: 0.89 mg/dL (ref 0.50–1.35)

## 2012-09-26 MED ORDER — POTASSIUM CHLORIDE CRYS ER 20 MEQ PO TBCR
40.0000 meq | EXTENDED_RELEASE_TABLET | Freq: Once | ORAL | Status: AC
  Administered 2012-09-26: 40 meq via ORAL
  Filled 2012-09-26: qty 2

## 2012-09-26 NOTE — Progress Notes (Signed)
Pt took meds with plain water, no indication for "honey thick liquids" found.  MD aware.  Of note, after RN mentioned to cardiologist episode of RVR this morning, Pt admitted to masturbating just prior to event.   Will con't  plan of care.

## 2012-09-26 NOTE — Progress Notes (Signed)
HR 143, pt masturbating.  Denies complaints, NAD.  Will continue to monitor.

## 2012-09-26 NOTE — Progress Notes (Signed)
Family Medicine Teaching Service Daily Progress Note Service Pager: [phone]   Subjective: Pt denies chest pain or SOB today. Talks about wanting to stop drinking. 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 Is planning to go stay with his nephew after discharge.  Objective: Temp:  [98.5 F (36.9 C)-98.8 F (37.1 C)] 98.5 F (36.9 C) (06/28 0435) Pulse Rate:  [56-83] 61 (06/28 0435) Resp:  [12-16] 12 (06/28 0435) BP: (104-145)/(41-72) 145/72 mmHg (06/28 0435) SpO2:  [95 %-99 %] 95 % (06/28 0435) Exam: General: NAD Cardiovascular: irregularly irregular rhythm, normal rate Respiratory: CTAB, NWOB Extremities: calves nontender to palpation. Bruising prominent on arms. Neuro: grossly nonfocal, speech clear, mentation clear  I have reviewed the patient's medications, labs, imaging, and diagnostic testing.  Notable results are summarized below.  Medications:  Scheduled Meds: . aspirin EC  81 mg Oral Daily  . folic acid  1 mg Oral Daily  . heparin  5,000 Units Subcutaneous Q8H  . lisinopril  5 mg Oral Daily  . multivitamin with minerals  1 tablet Oral Daily  . simvastatin  20 mg Oral QPM  . sodium chloride  3 mL Intravenous Q12H  . thiamine  100 mg Oral Daily   Or  . thiamine  100 mg Intravenous Daily   Continuous Infusions:  PRN Meds:.sodium chloride, LORazepam, LORazepam, sodium chloride  Labs:  CBC  Recent Labs Lab 09/24/12 0740 09/25/12 0229 09/26/12 0434  WBC 3.6* 3.5* 3.1*  HGB 11.1* 10.5* 10.3*  HCT 30.1* 29.9* 29.4*  PLT 141* 129* 98*     BMET  Recent Labs Lab 09/24/12 1836 09/25/12 0229 09/26/12 0434  NA 129* 130* 131*  K 4.1 4.1 3.4*  CL 88* 91* 96  CO2 22 27 27   BUN 10 12 13   CREATININE 0.67 0.78 0.89  GLUCOSE 72 75 109*  CALCIUM 8.6 8.5 8.7        Assessment/Plan:  [name] is a 77 y.o. year old male with hx of CAD and MI  s/p CABG, atrial fibrillation with bundle branch block and bradycardia, HTN, HLD, and heavy alcohol use, presenting with chest pain.   # Atrial fibrillation with bradycardia - not on chronic anticoagulation due to alcohol use  - some bradycardia, per cards we are holding metoprolol - monitor on telemetry  - per pt's discussion with Dr. Gwendolyn Grant, would potentially be willing to have a pacemaker placed if it was necessary - cardiology evaluated pt on 6/27 and recommended holding metoprolol and monitoring pt on telemetry - did have an episode of afib with RVR overnight up to 125 bpm  # Chest pain - likely more anxiety related although hx is difficult to obtain. Does have significant cardiac hx.  - EKG on 6/26 showed accelerated ventricular rhythm, some signs of heart strain (ST depression) but troponins negative x 3 and pt denies being in pain currently - continue aspirin 81mg  daily   # Hyponatremia - Most likely secondary to beer potomania. Pt with clear signs of alcohol intoxication (286 mg/dl). Moderate Hyponatremia (Na 121 without neurologic symptoms) Goal for Na correction in 24h is 4-6 meq/l.  - Na now 131 - check BMET daily  # Hypokalemia  - replete PO today  # HTN - normotensive currently  - now holding metoprolol. Lisinopril held due to borderline low BP.  [ ]  f/u BP's  # Chronic back pain  -  restart home tramadol today  # Heavy alcohol use - likely cause of hyponatremia (beer potomania)  - thiamine, folate, MVI  - CIWA protocol with seizure and fall precautions, score 0 thus far, no ativan required - consult social work for available resources  # Depression/suicidal ideation: SI resolved, likely related to intoxication  # HLD: continue statin   FEN/GI: heart-healthy diet with 1800 mL fluid restriction, KVO IVF   Prophylaxis: subQ heparin (watch platelets since chronically runs low)  Disposition: Pending improvement & CSW consultation, possibly to inpatient rehab for  alcohol abuse Floor status with telemetry [ ]  f/u PT/OT recs  Code Status: Full   Levert Feinstein, MD [facility] Medicine PGY-1 Service Pager [phone]

## 2012-09-26 NOTE — Progress Notes (Signed)
FMTS Attending Daily Note:  Renold Don MD  [phone] pager  Family Practice pager:  [phone] I have seen and examined this patient and have reviewed their chart. I have discussed this patient with the resident. I agree with the resident's findings, assessment and care plan.  Additionally:  - Patient doing well, much improved.  No further CP. - Feels somewhat unsteady on his feet, but improving from admission.  PT recommends home health - Had episode of tachycardia last night/this AM off beta blocker.  Pt admits this was secondary to self-stimulation episode - Greatly appreciate cardiology input.  Will watch on tele while off beta blocker for next 24 hours to ensure no further tachycardia.    Tobey Grim, MD 09/26/2012 1:39 PM

## 2012-09-26 NOTE — Evaluation (Signed)
Physical Therapy Evaluation Patient Details Name: EDD REPPERT MRN: [id number] DOB: 08-Feb-1936 Today's Date: 09/26/2012 Time: 9629-5284 PT Time Calculation (min): 22 min  PT Assessment / Plan / Recommendation History of Present Illness  Patient is a 77 yo male admitted with chest pain, Afib, bradycardia, with h/o heavy ETOH abuse.  Clinical Impression  Patient presents with weakness and decreased balance impacting functional mobility.  Will benefit from acute PT to maximize independence prior to discharge.    PT Assessment  Patient needs continued PT services    Follow Up Recommendations  Home health PT    Does the patient have the potential to tolerate intense rehabilitation      Barriers to Discharge Decreased caregiver support Patient lives alone    Equipment Recommendations  None recommended by PT    Recommendations for Other Services     Frequency Min 3X/week    Precautions / Restrictions Precautions Precautions: Fall Restrictions Weight Bearing Restrictions: No   Pertinent Vitals/Pain       Mobility  Bed Mobility Bed Mobility: Supine to Sit;Sit to Supine Supine to Sit: 4: Min guard;With rails;HOB flat Sit to Supine: 4: Min guard;With rail;HOB flat Details for Bed Mobility Assistance: Verbal cues for technique.  Assist for safety only. Transfers Transfers: Sit to Stand;Stand to Sit Sit to Stand: 4: Min guard;With upper extremity assist;From bed Stand to Sit: 4: Min guard;With upper extremity assist;To bed Details for Transfer Assistance: Verbal cues for hand placement. Ambulation/Gait Ambulation/Gait Assistance: 4: Min guard Ambulation Distance (Feet): 200 Feet Assistive device: Rolling walker Ambulation/Gait Assistance Details: Verbal cues for safe use of RW.  Cues to stand upright and stay inside RW.  Patient with flexed posture and unsteadiness with gait.  Dyspnea 2/4 with gait. Gait Pattern: Step-through pattern;Decreased stride length;Trunk  flexed Gait velocity: Slow gait speed    Exercises General Exercises - Lower Extremity Ankle Circles/Pumps: AROM;Both;10 reps;Seated   PT Diagnosis: Difficulty walking;Abnormality of gait;Generalized weakness  PT Problem List: Decreased strength;Decreased activity tolerance;Decreased balance;Decreased mobility;Decreased knowledge of use of DME PT Treatment Interventions: DME instruction;Gait training;Functional mobility training;Balance training;Therapeutic exercise;Patient/family education     PT Goals(Current goals can be found in the care plan section) Acute Rehab PT Goals Patient Stated Goal: To be able to get back to my house. PT Goal Formulation: With patient Time For Goal Achievement: 10/03/12 Potential to Achieve Goals: Good  Visit Information  Last PT Received On: 09/26/12 Assistance Needed: +1 History of Present Illness: Patient is a 77 yo male admitted with chest pain, Afib, bradycardia, with h/o heavy ETOH abuse.       Prior Functioning  Home Living Family/patient expects to be discharged to:: Private residence Living Arrangements: Alone Type of Home: House Home Access: Stairs to enter [employer] of Steps: 1 Entrance Stairs-Rails: None Home Layout: One level Home Equipment: Environmental consultant - 2 wheels;Cane - single point Prior Function Level of Independence: Independent with assistive device(s) (Uses "walking stick" outside for stability) Communication Communication: No difficulties    Cognition  Cognition Arousal/Alertness: Awake/alert Behavior During Therapy: WFL for tasks assessed/performed Overall Cognitive Status: Within Functional Limits for tasks assessed    Extremity/Trunk Assessment Upper Extremity Assessment Upper Extremity Assessment: Overall WFL for tasks assessed Lower Extremity Assessment Lower Extremity Assessment: Generalized weakness   Balance    End of Session PT - End of Session Equipment Utilized During Treatment: Gait  belt Activity Tolerance: Patient limited by fatigue Patient left: in bed;with call bell/phone within reach Nurse Communication: Mobility status  GP  Vena Austria 09/26/2012, 10:36 AM  Durenda Hurt. Renaldo Fiddler, [facility] Acute Rehab Services Pager [phone]

## 2012-09-26 NOTE — Progress Notes (Addendum)
SUBJECTIVE:  No compliants.  Had an episode of afib with RVR this am up to 125bpm.  OBJECTIVE:   Vitals:   Filed Vitals:   09/25/12 1544 09/25/12 1546 09/25/12 2041 09/26/12 0435  BP:   112/60 145/72  Pulse: 69 56 83 61  Temp: 98.7 F (37.1 C)  98.8 F (37.1 C) 98.5 F (36.9 C)  TempSrc: Oral  Oral Oral  Resp: 16  16 12   Height:      Weight:      SpO2: 99%  98% 95%   I&O's:   Intake/Output Summary (Last 24 hours) at 09/26/12 1030 Last data filed at 09/26/12 0700  Gross per 24 hour  Intake    120 ml  Output    700 ml  Net   -580 ml   TELEMETRY: Reviewed telemetry pt in atrial fibrillation currently rate controlled     PHYSICAL EXAM General: Well developed, well nourished, in no acute distress Head: Eyes PERRLA, No xanthomas.   Normal cephalic and atramatic  Lungs:   Clear bilaterally to auscultation and percussion. Heart:   Irregularly irregular S1 S2 Pulses are 2+ & equal. Abdomen: Bowel sounds are positive, abdomen soft and non-tender without masses Extremities:   No clubbing, cyanosis or edema.  DP +1 Neuro: Alert and oriented X 3. Psych:  Good affect, responds appropriately   LABS: Basic Metabolic Panel:  Recent Labs  16/10/96 1956  09/25/12 0229 09/26/12 0434  NA  --   < > 130* 131*  K  --   < > 4.1 3.4*  CL  --   < > 91* 96  CO2  --   < > 27 27  GLUCOSE  --   < > 75 109*  BUN  --   < > 12 13  CREATININE  --   < > 0.78 0.89  CALCIUM  --   < > 8.5 8.7  MG 1.4*  --   --   --   < > = values in this interval not displayed. Liver Function Tests:  Recent Labs  09/23/12 1832  AST 29  ALT 15  ALKPHOS 49  BILITOT 1.7*  PROT 6.0  ALBUMIN 3.2*   No results found for this basename: LIPASE, AMYLASE,  in the last 72 hours CBC:  Recent Labs  09/25/12 0229 09/26/12 0434  WBC 3.5* 3.1*  HGB 10.5* 10.3*  HCT 29.9* 29.4*  MCV 93.4 93.6  PLT 129* 98*   Cardiac Enzymes:  Recent Labs  09/23/12 2305 09/24/12 0350 09/24/12 1010  TROPONINI <0.30  <0.30 <0.30   Coag Panel:   Lab Results  Component Value Date   INR 0.99 09/13/2012   INR 1.06 11/13/2011   INR 1.11 09/11/2011    RADIOLOGY: Dg Chest Port 1 View  09/23/2012   *RADIOLOGY REPORT*  Clinical Data: 77 year old male with chest pain weakness.  PORTABLE CHEST - 1 VIEW  Comparison: 09/12/2012 and earlier.  Findings: Portable AP view at 1932 hours.  Stable lung volumes. Stable cardiac size and mediastinal contours.  Stable sequelae of CABG. Visualized tracheal air column is within normal limits.  No pneumothorax, pulmonary edema, pleural effusion or confluent pulmonary opacity.  Cervical carotid calcified atherosclerosis on the left.  IMPRESSION: No acute cardiopulmonary abnormality.   Original Report Authenticated By: Erskine Speed, M.D.   Dg Chest Portable 1 View  09/12/2012   *RADIOLOGY REPORT*  Clinical Data: Shortness of breath, weakness.  PORTABLE CHEST - 1 VIEW  Comparison: 11/13/2011  Findings: Previous CABG.  Stable mild cardiomegaly.  Lungs clear. No effusion.  Atheromatous aortic arch.  IMPRESSION:  1.  No acute disease post CABG.  Stable mild cardiomegaly.   Original Report Authenticated By: D. Andria Rhein, MD   Assessment/Plan  1 atrial fibrillation- Continue aspirin. Not a Coumadin candidate given history of EtOH abuse.  2 bradycardia-Resolved off beta blocker but now he had an episode of afib with RVR. Difficult situation.  Could try low dose Amio to suppress afib but still may get bradycardic.  Given severe ETOH, Amio may not be a good option due to risk of liver toxicity.  Will continue to monitor off beta blockers.  May benefit from a 24 hr Holter to determine average HR and if under 100bpm would not do anything further unless symptomatic with his episodes of afib with RVR 3 EtOH abuse-management per primary care. Patient counseled on discontinuing.  4 coronary artery disease-continue aspirin and statin.  5 hypertension-continue remaining blood pressure medications.  6  hyponatremia-felt secondary to alcohol. Management per primary care.  7 hypokalemia - replete per primary MD       Quintella Reichert, MD  09/26/2012  10:30 AM

## 2012-09-27 DIAGNOSIS — E871 Hypo-osmolality and hyponatremia: Secondary | ICD-10-CM

## 2012-09-27 LAB — BASIC METABOLIC PANEL
BUN: 12 mg/dL (ref 6–23)
CO2: 25 mEq/L (ref 19–32)
Chloride: 96 mEq/L (ref 96–112)
Creatinine, Ser: 0.8 mg/dL (ref 0.50–1.35)
GFR calc Af Amer: >90 mL/min (ref >90)

## 2012-09-27 LAB — CBC
HCT: 28.9 % — ABNORMAL LOW (ref 39.0–52.0)
MCH: 33.1 pg (ref 26.0–34.0)
MCHC: 35.3 g/dL (ref 30.0–36.0)
MCV: 93.8 fL (ref 78.0–100.0)
RDW: 12.4 % (ref 11.5–15.5)

## 2012-09-27 NOTE — Progress Notes (Signed)
FMTS Attending Daily Note:  Renold Don MD  [phone] pager  Family Practice pager:  [phone] I have seen and examined this patient and have reviewed their chart. I have discussed this patient with the resident. I agree with the resident's findings, assessment and care plan.  Additionally: - Patient completely asymptomatic and without any evidence of tachycardia -- except during 2 discrete episodes of self-stimulation - No further chest pain - Has been on telemetry now for 48 hours without any evidence of either bradycardia or tachycardia.   - Plan to continue to hold beta blocker on discharge with close FU with cardiology and possible outpt Holter monitor.  - As he is doing so well without any events in over 48 hours on telemetry, plan DC home today.    Tobey Grim, MD 09/27/2012 2:49 PM

## 2012-09-27 NOTE — Progress Notes (Signed)
SUBJECTIVE:  Had 2 episodes of tachycardia last PM - nurse found patient masturbating both times OBJECTIVE:   Vitals:   Filed Vitals:   09/26/12 0435 09/26/12 1432 09/26/12 2024 09/27/12 0556  BP: 145/72 108/45 155/89 146/75  Pulse: 61 75 95 73  Temp: 98.5 F (36.9 C) 98.9 F (37.2 C) 97.9 F (36.6 C) 98.1 F (36.7 C)  TempSrc: Oral Oral Oral Oral  Resp: 12 16 17 19   Height:      Weight:      SpO2: 95% 97% 98% 91%   I&O's:   Intake/Output Summary (Last 24 hours) at 09/27/12 0829 Last data filed at 09/27/12 1610  Gross per 24 hour  Intake    120 ml  Output   1050 ml  Net   -930 ml   TELEMETRY: Reviewed telemetry pt in atrial fibrillation     PHYSICAL EXAM General: Well developed, well nourished, in no acute distress Head: Eyes PERRLA, No xanthomas.   Normal cephalic and atramatic  Lungs:   Clear bilaterally to auscultation and percussion. Heart:   HRRR S1 S2 Pulses are 2+ & equal. Abdomen: Bowel sounds are positive, abdomen soft and non-tender without masses Extremities:  No clubbing, cyanosis or edema.  DP +1 Neuro: Alert and oriented X 3. Psych:  Good affect, responds appropriately   LABS: Basic Metabolic Panel:  Recent Labs  96/04/54 0434 09/27/12 0545  NA 131* 129*  K 3.4* 3.5  CL 96 96  CO2 27 25  GLUCOSE 109* 103*  BUN 13 12  CREATININE 0.89 0.80  CALCIUM 8.7 8.9   Liver Function Tests: No results found for this basename: AST, ALT, ALKPHOS, BILITOT, PROT, ALBUMIN,  in the last 72 hours No results found for this basename: LIPASE, AMYLASE,  in the last 72 hours CBC:  Recent Labs  09/26/12 0434 09/27/12 0545  WBC 3.1* 3.8*  HGB 10.3* 10.2*  HCT 29.4* 28.9*  MCV 93.6 93.8  PLT 98* 98*   Cardiac Enzymes:  Recent Labs  09/24/12 1010  TROPONINI <0.30    Coag Panel:   Lab Results  Component Value Date   INR 0.99 09/13/2012   INR 1.06 11/13/2011   INR 1.11 09/11/2011    RADIOLOGY: Dg Chest Port 1 View  09/23/2012   *RADIOLOGY REPORT*   Clinical Data: 77 year old male with chest pain weakness.  PORTABLE CHEST - 1 VIEW  Comparison: 09/12/2012 and earlier.  Findings: Portable AP view at 1932 hours.  Stable lung volumes. Stable cardiac size and mediastinal contours.  Stable sequelae of CABG. Visualized tracheal air column is within normal limits.  No pneumothorax, pulmonary edema, pleural effusion or confluent pulmonary opacity.  Cervical carotid calcified atherosclerosis on the left.  IMPRESSION: No acute cardiopulmonary abnormality.   Original Report Authenticated By: Erskine Speed, M.D.   Dg Chest Portable 1 View  09/12/2012   *RADIOLOGY REPORT*  Clinical Data: Shortness of breath, weakness.  PORTABLE CHEST - 1 VIEW  Comparison: 11/13/2011  Findings: Previous CABG.  Stable mild cardiomegaly.  Lungs clear. No effusion.  Atheromatous aortic arch.  IMPRESSION:  1.  No acute disease post CABG.  Stable mild cardiomegaly.   Original Report Authenticated By: D. Andria Rhein, MD   Assessment/Plan  1 atrial fibrillation- Continue aspirin. Not a Coumadin candidate given history of EtOH abuse.  2 bradycardia-Resolved off beta blocker but  he had 2 episodes of afib with RVR while masturbating in his hospital bed yesterday per the nurse.  Will continue to monitor  off beta blockers. May benefit from a 24 hr Holter to determine average HR and if under 100bpm would not do anything further unless symptomatic with his episodes of afib with RVR  3 EtOH abuse-management per primary care. Patient counseled on discontinuing.  4 coronary artery disease-continue aspirin and statin.  5 hypertension-continue remaining blood pressure medications.  6 hyponatremia-felt secondary to alcohol. Management per primary care.  7 hypokalemia - replete per primary MD        Quintella Reichert, MD  09/27/2012  8:29 AM

## 2012-09-27 NOTE — Progress Notes (Signed)
Clinical Social Work Department BRIEF PSYCHOSOCIAL ASSESSMENT 09/27/2012  Patient:  [name], [name]     Account Number:  [id number]     Admit date:  09/23/2012  Clinical Social Worker:  Dennison Bulla  Date/Time:  09/27/2012 09:40 AM  Referred by:  Physician  Date Referred:  09/27/2012 Referred for  Substance Abuse   Other Referral:   Interview type:  Patient Other interview type:    PSYCHOSOCIAL DATA Living Status:  ALONE Admitted from facility:   Level of care:   Primary support name:  [name] Primary support relationship to patient:  FAMILY Degree of support available:   Lacking    CURRENT CONCERNS Current Concerns  Substance Abuse   Other Concerns:    SOCIAL WORK ASSESSMENT / PLAN CSW received referral to assess for substance abuse. CSW reviewed chart and met with patient at bedside. CSW introduced myself and explained role.    Patient reports that he lives alone and that second wife passed away a few years ago from bone cancer. Patient has three children by his first wife but does not have any contact with them. Patient has supportive sister that assists occasionally. Patient reports that he has not been drinking alcohol since being in the hospital but reports that he would binge drink when at home. CSW and patient spoke about emotionally aspects to drinking alcohol and negative harm effects.    Patient is aware that he needs to quit drinking alcohol and feels confident he can eliminate use on his own. CSW offered resources from inpatient treatment, outpatient therapy, support groups, or AA meetings. Patient feels none of this treatment would be beneficial and reports he wants to join his sister's church and use church support to remain sober. Patient refuses any resources despite CSW explaining the benefit of additional support.    CSW provided patient with resource list of the area per patient request. Patient agreeable to complete SBIRT but still denies treatment. CSW is  signing off but available if further needs arise.   Assessment/plan status:  No Further Intervention Required Other assessment/ plan:   Information/referral to community resources:   [employer]  Patient refused substance abuse treatment referral    PATIENT'S/FAMILY'S RESPONSE TO PLAN OF CARE: Patient alert and oriented. Patient agreeable to assessment and engaged throughout assessment. Patient reports no further needs at this time from CSW.       WEEKEND COVERAGE

## 2012-09-27 NOTE — Progress Notes (Signed)
   CARE MANAGEMENT NOTE 09/27/2012  Patient:  [name], [name]   Account Number:  [id number]  Date Initiated:  09/24/2012  Documentation initiated by:  [name]  Subjective/Objective Assessment:   hyponatremia     Action/Plan:   lives alone, pcp dr [name]   Anticipated DC Date:  09/28/2012   Anticipated DC Plan:  HOME W HOME HEALTH SERVICES  In-house referral  Clinical Social Worker      DC Planning Services  CM consult      [facility] Choice  HOME HEALTH   Choice offered to / List presented to:  [name]        HH arranged  HH-2 PT      HH agency  [facility]   Status of service:  Completed, signed off Medicare Important Message given?   (If response is "NO", the following Medicare IM given date fields will be blank) Date Medicare IM given:   Date Additional Medicare IM given:    Discharge Disposition:  HOME W HOME HEALTH SERVICES  Per UR Regulation:  Reviewed for med. necessity/level of care/duration of stay  If discussed at Long Length of Stay Meetings, dates discussed:    Comments:  09/27/2012 1020 NCM spoke pt and he had [facility] in the past. Offered choice for [facility]. Pt requesting [facility]. Faxed referral to [facility]. Pt has cane and RW at home. Marcus Donning RN CCM Case Mgmt phone [phone]  Contact:  [name] [phone]   [phone]                 [name] Niece [phone]

## 2012-09-27 NOTE — Progress Notes (Signed)
Family Medicine Teaching Service Daily Progress Note Intern Pager: [phone]  Patient name: [name] Medical record number: [id number] Date of birth: Apr 03, 1935 Age: 77 y.o. Gender: male  Primary Care Provider: Tobin Chad, MD Consultants: cardiology Code Status: full  Assessment and Plan: [name] is a 77 y.o. year old male with hx of CAD and MI s/p CABG, atrial fibrillation with bundle branch block and bradycardia, HTN, HLD, and heavy alcohol use, presenting with chest pain.   # Atrial fibrillation with bradycardia - not on chronic anticoagulation due to alcohol use  - metoprolol held per cards due to bradycardia - non sustained run of afib with RVR in 120's-130's while masturbating. Continue monitoring HR per cardiology. Consider halter monitor for average heart rate.  [ ] monitor HR  # Chest pain - resolved, likely more anxiety related although hx is difficult to obtain. Does have significant cardiac hx.  - EKG on 6/26 showed accelerated ventricular rhythm, some signs of heart strain (ST depression) but troponins negative x 3 and pt denies being in pain currently  - continue aspirin 81mg  daily   # Hyponatremia - Most likely secondary to beer potomania. Stable at 129 from 123 on admission  - check BMET daily   # Hypokalemia  - normal today   # HTN - in 150's.  - now holding metoprolol.  - on lisinopril 5mg  daily [ ]  f/u BP's   # Chronic back pain  - restart home tramadol if needed # Heavy alcohol use - likely cause of hyponatremia (beer potomania)  - thiamine, folate, MVI  - CIWA protocol with seizure and fall precautions, score 0 thus far, no ativan required  - social work has seen patient and gave him community resources. He is not interested in referral at this time.  # Depression/suicidal ideation: SI resolved, likely related to intoxication  # HLD: continue statin  FEN/GI: heart-healthy diet with 1800 mL fluid restriction, KVO IVF  Prophylaxis: subQ  heparin (watch platelets since chronically runs low)  Disposition: Pending stable HR Floor status with telemetry  [ ]  f/u PT/OT recs  Code Status: Full  Subjective:  2 episodes of asymptomatic afib with RVR over night associated with patient masturbating Feeling better than on admission. No chest pain, no shortness of breath, ambulating down hall without significant dyspnea No nausea, no vomiting. Tolerating po  Objective: Temp:  [97.9 F (36.6 C)-98.9 F (37.2 C)] 98.1 F (36.7 C) (06/29 0556) Pulse Rate:  [73-95] 73 (06/29 0556) Resp:  [16-19] 19 (06/29 0556) BP: (108-155)/(45-89) 146/75 mmHg (06/29 0556) SpO2:  [91 %-98 %] 91 % (06/29 0556) Physical Exam: General: no acute distress, pleasant, a+ox3, laying in bed Cardiovascular: S1S2, irregularly irregular rhythm, normal rate, no murmur appreciated  Respiratory: normal resp effort, clear to auscultation bilaterally Abdomen: soft, non tender, non distended Extremities: no lower extremity edema  Laboratory:  Recent Labs Lab 09/25/12 0229 09/26/12 0434 09/27/12 0545  WBC 3.5* 3.1* 3.8*  HGB 10.5* 10.3* 10.2*  HCT 29.9* 29.4* 28.9*  PLT 129* 98* 98*    Recent Labs Lab 09/23/12 1832  09/25/12 0229 09/26/12 0434 09/27/12 0545  NA 122*  < > 130* 131* 129*  K 3.2*  < > 4.1 3.4* 3.5  CL 82*  < > 91* 96 96  CO2 25  < > 27 27 25   BUN 11  < > 12 13 12   CREATININE 0.71  < > 0.78 0.89 0.80  CALCIUM 8.4  < > 8.5 8.7 8.9  PROT 6.0  --   --   --   --   BILITOT 1.7*  --   --   --   --   ALKPHOS 49  --   --   --   --   ALT 15  --   --   --   --   AST 29  --   --   --   --   GLUCOSE 72  < > 75 109* 103*  < > = values in this interval not displayed.   Imaging/Diagnostic Tests: DG Chest: 09/23/12 Comparison: 09/12/2012 and earlier.  Findings: Portable AP view at 1932 hours. Stable lung volumes.  Stable cardiac size and mediastinal contours. Stable sequelae of  CABG. Visualized tracheal air column is within normal  limits. No  pneumothorax, pulmonary edema, pleural effusion or confluent  pulmonary opacity. Cervical carotid calcified atherosclerosis on  the left.  IMPRESSION:  No acute cardiopulmonary abnormality.   Lonia Skinner, MD 09/27/2012, 12:02 PM PGY-2, [facility] FPTS Intern pager: [phone] Amion password: [email or web address], text pages welcome

## 2012-09-28 DIAGNOSIS — F1010 Alcohol abuse, uncomplicated: Secondary | ICD-10-CM

## 2012-09-28 LAB — BASIC METABOLIC PANEL
BUN: 13 mg/dL (ref 6–23)
Creatinine, Ser: 0.76 mg/dL (ref 0.50–1.35)
GFR calc Af Amer: >90 mL/min (ref >90)
GFR calc non Af Amer: 88 mL/min — ABNORMAL LOW (ref >90)

## 2012-09-28 LAB — CBC
MCHC: 35.4 g/dL (ref 30.0–36.0)
RDW: 12.4 % (ref 11.5–15.5)

## 2012-09-28 MED ORDER — POTASSIUM CHLORIDE CRYS ER 20 MEQ PO TBCR
40.0000 meq | EXTENDED_RELEASE_TABLET | Freq: Once | ORAL | Status: AC
  Administered 2012-09-28: 40 meq via ORAL
  Filled 2012-09-28: qty 2

## 2012-09-28 MED ORDER — FOLIC ACID 1 MG PO TABS: 1.0000 mg | ORAL_TABLET | Freq: Every day | ORAL | Status: DC

## 2012-09-28 MED ORDER — THIAMINE HCL 100 MG/ML IJ SOLN: 100.0000 mg | Freq: Every day | INTRAMUSCULAR | Status: DC

## 2012-09-28 MED ORDER — ADULT MULTIVITAMIN W/MINERALS CH: 1.0000 | ORAL_TABLET | Freq: Every day | ORAL | Status: DC

## 2012-09-28 MED ORDER — LORAZEPAM 2 MG/ML IJ SOLN: 1.0000 mg | Freq: Four times a day (QID) | INTRAMUSCULAR | Status: DC | PRN

## 2012-09-28 MED ORDER — LORAZEPAM 1 MG PO TABS
1.0000 mg | ORAL_TABLET | Freq: Four times a day (QID) | ORAL | Status: DC | PRN
  Administered 2012-09-29: 1 mg via ORAL
  Filled 2012-09-28: qty 1

## 2012-09-28 MED ORDER — VITAMIN B-1 100 MG PO TABS: 100.0000 mg | ORAL_TABLET | Freq: Every day | ORAL | Status: DC

## 2012-09-28 NOTE — Progress Notes (Signed)
Family Medicine Teaching Service Attending Note  I interviewed and examined patient [name] and reviewed their tests and x-rays.  I discussed with Dr. Pollie Meyer and reviewed their note for today.  I agree with their assessment and plan.     Additionally  Alert calm Or x 3 No tremor Able to stand without assistance Does not plan on drinking alcohol again but does not want rehab If does well on telemetry and does not require benzos overnight ok to discharge

## 2012-09-28 NOTE — Progress Notes (Signed)
Physical Therapy Treatment Patient Details Name: ANEES VANECEK MRN: [id number] DOB: 1936-02-04 Today's Date: 09/28/2012 Time: 4132-4401 PT Time Calculation (min): 19 min  PT Assessment / Plan / Recommendation  PT Comments   Patient progressing with therapy. Patient lives alone and is requiring supervision. Well aware that patient manages his health poorly and does not want assistance. Patient has all his equipment that is needed and we still recommend HHPT if patient is compliant and will agree to have someone in his home  Follow Up Recommendations  Home health PT     Does the patient have the potential to tolerate intense rehabilitation     Barriers to Discharge        Equipment Recommendations  None recommended by PT    Recommendations for Other Services    Frequency Min 3X/week   Progress towards PT Goals Progress towards PT goals: Progressing toward goals  Plan Current plan remains appropriate    Precautions / Restrictions Precautions Precautions: Fall Restrictions Weight Bearing Restrictions: No   Pertinent Vitals/Pain no apparent distress     Mobility  Bed Mobility Bed Mobility: Not assessed Transfers Sit to Stand: 5: Supervision;From chair/3-in-1 Stand to Sit: 5: Supervision;To chair/3-in-1 Details for Transfer Assistance: VCs for safety Ambulation/Gait Ambulation/Gait Assistance: 5: Supervision Ambulation Distance (Feet): 300 Feet Assistive device: Rolling walker Ambulation/Gait Assistance Details: Cues for safety with RW management especially with turns.  Gait Pattern: Step-through pattern;Decreased stride length;Trunk flexed Gait velocity: decreased    Exercises     PT Diagnosis:    PT Problem List:   PT Treatment Interventions:     PT Goals (current goals can now be found in the care plan section)    Visit Information  Last PT Received On: 09/28/12 Assistance Needed: +1 History of Present Illness: Patient is a 77 yo male admitted with chest  pain, Afib, bradycardia, with h/o heavy ETOH abuse.    Subjective Data      Cognition  Cognition Arousal/Alertness: Awake/alert Behavior During Therapy: WFL for tasks assessed/performed Overall Cognitive Status: Within Functional Limits for tasks assessed    Balance     End of Session PT - End of Session Equipment Utilized During Treatment: Gait belt Activity Tolerance: Patient tolerated treatment well Patient left: in chair;with call bell/phone within reach Nurse Communication: Mobility status   GP     Fredrich Birks 09/28/2012, 10:42 AM 09/28/2012 Fredrich Birks PTA [phone] pager [phone] office

## 2012-09-28 NOTE — Progress Notes (Signed)
Occupational Therapy Evaluation Patient Details Name: [name] MRN: [id number] DOB: January 28, 1936 Today's Date: 09/28/2012 Time: 1207-1220 OT Time Calculation (min): 13 min  OT Assessment / Plan / Recommendation History of present illness Patient is a 77 yo male admitted with chest pain, Afib, bradycardia, with h/o heavy ETOH abuse.   Clinical Impression   PTA, pt lived alone and was mod I with mobility and ADL. History of ETOH use. Recommend HHOT as pt is going to D/C home alone. All further OT needs to be addressed by HHOT.    OT Assessment  All further OT needs can be met in the next venue of care    Follow Up Recommendations  Home health OT    Barriers to Discharge  caregiver support    Equipment Recommendations  None recommended by OT    Recommendations for Other Services    Frequency    eval only   Precautions / Restrictions Precautions Precautions: Fall Restrictions Weight Bearing Restrictions: No   Pertinent Vitals/Pain no apparent distress    ADL  Eating/Feeding: Independent Grooming: Modified independent Where Assessed - Grooming: Unsupported standing Upper Body Bathing: Modified independent Where Assessed - Upper Body Bathing: Unsupported sitting Lower Body Bathing: Supervision/safety Where Assessed - Lower Body Bathing: Unsupported sit to stand Upper Body Dressing: Set up Where Assessed - Upper Body Dressing: Unsupported sitting Lower Body Dressing: Supervision/safety Where Assessed - Lower Body Dressing: Unsupported sit to stand Toilet Transfer: Supervision/safety Toileting - Clothing Manipulation and Hygiene: Modified independent Equipment Used: Gait belt Transfers/Ambulation Related to ADLs: S ADL Comments: most likely at baseline    OT Diagnosis: Generalized weakness  OT Problem List: Decreased strength;Decreased activity tolerance OT Treatment Interventions:     OT Goals(Current goals can be found in the care plan section) Acute Rehab OT  Goals Patient Stated Goal: To be able to get back to my house.  Visit Information  Last OT Received On: 09/28/12 Assistance Needed: +1 History of Present Illness: Patient is a 77 yo male admitted with chest pain, Afib, bradycardia, with h/o heavy ETOH abuse.       Prior Functioning     Home Living Family/patient expects to be discharged to:: Private residence Living Arrangements: Alone Type of Home: House Home Access: Stairs to enter [employer] of Steps: 1 Entrance Stairs-Rails: None Home Layout: One level Home Equipment: Environmental consultant - 2 wheels;Cane - single point;Shower seat;Grab bars - tub/shower Prior Function Level of Independence: Independent with assistive device(s) (Uses "walking stick" outside for stability) Communication Communication: No difficulties         Vision/Perception     Cognition  Cognition Arousal/Alertness: Awake/alert Behavior During Therapy: WFL for tasks assessed/performed Overall Cognitive Status: Within Functional Limits for tasks assessed    Extremity/Trunk Assessment Upper Extremity Assessment Upper Extremity Assessment: Overall WFL for tasks assessed Lower Extremity Assessment Lower Extremity Assessment: Defer to PT evaluation Cervical / Trunk Assessment Cervical / Trunk Assessment: Normal     Mobility Bed Mobility Bed Mobility: Not assessed Transfers Transfers: Sit to Stand;Stand to Sit Sit to Stand: With upper extremity assist;5: Supervision;From chair/3-in-1 Stand to Sit: With upper extremity assist;5: Supervision;To chair/3-in-1 Details for Transfer Assistance: VCs for safety     Exercise     Balance  S   End of Session OT - End of Session Activity Tolerance: Patient tolerated treatment well Patient left: in chair;with call bell/phone within reach Nurse Communication: Mobility status  GO     Sherlyne Crownover,HILLARY 09/28/2012, 2:25 PM Syndi Pua, OTR/L  [phone] 09/28/2012 

## 2012-09-28 NOTE — Progress Notes (Addendum)
   SUBJECTIVE: At this time, he denies chest pain, shortness of breath, or any new concerns.  "I just want to get lost in the bottle again".   Marland Kitchen aspirin EC  81 mg Oral Daily  . folic acid  1 mg Oral Daily  . heparin  5,000 Units Subcutaneous Q8H  . lisinopril  5 mg Oral Daily  . multivitamin with minerals  1 tablet Oral Daily  . simvastatin  20 mg Oral QPM  . sodium chloride  3 mL Intravenous Q12H  . thiamine  100 mg Oral Daily   Or  . thiamine  100 mg Intravenous Daily      OBJECTIVE: Physical Exam: Filed Vitals:   09/27/12 0556 09/27/12 1400 09/27/12 2000 09/28/12 0434  BP: 146/75 132/78 127/61 113/65  Pulse: 73 80 77 66  Temp: 98.1 F (36.7 C) 98 F (36.7 C) 98.7 F (37.1 C) 98.2 F (36.8 C)  TempSrc: Oral Oral Oral Oral  Resp: 19 18 18 17   Height:      Weight:      SpO2: 91% 95% 98% 96%    Intake/Output Summary (Last 24 hours) at 09/28/12 0981 Last data filed at 09/27/12 2233  Gross per 24 hour  Intake    240 ml  Output    276 ml  Net    -36 ml    Telemetry reveals afib  GEN- The patient is elderly appearing, alert and oriented x 3 today.   Head- normocephalic, atraumatic Eyes-  Sclera clear, conjunctiva pink Ears- hearing intact Oropharynx- clear Neck- supple,  Lungs- Clear to ausculation bilaterally, normal work of breathing Heart- irregular rate and rhythm, no murmurs, rubs or gallops, PMI not laterally displaced GI- soft, NT, ND, + BS Extremities- no clubbing, cyanosis, or edema Skin- no rash or lesion Psych- flat affect Neuro- strength and sensation are intact, + resting tremor  LABS: Basic Metabolic Panel:  Recent Labs  19/14/78 0545 09/28/12 0426  NA 129* 129*  K 3.5 3.3*  CL 96 97  CO2 25 25  GLUCOSE 103* 96  BUN 12 13  CREATININE 0.80 0.76  CALCIUM 8.9 9.2   Liver Function Tests: No results found for this basename: AST, ALT, ALKPHOS, BILITOT, PROT, ALBUMIN,  in the last 72 hours No results found for this basename: LIPASE,  AMYLASE,  in the last 72 hours CBC:  Recent Labs  09/27/12 0545 09/28/12 0426  WBC 3.8* 3.9*  HGB 10.2* 10.3*  HCT 28.9* 29.1*  MCV 93.8 94.2  PLT 98* 101*    ASSESSMENT AND PLAN:    Assessment/Plan  1 atrial fibrillation- Not a Coumadin candidate given ongoing heavy  EtOH abuse.  2 bradycardia-Resolved off beta blocker  As he has been stable on telemetry, I would not find additional value in a 48 hour monitor at this point.  Would discharge without a monitor.  I can re-evaluate when he returns to the office. 3 EtOH abuse-management per primary care. Patient counseled on discontinuing.  He is not ready to quit 4 coronary artery disease-continue aspirin and statin.  5 hypertension-continue remaining blood pressure medications.  6 hyponatremia-felt secondary to alcohol. Management per primary care.  7 hypokalemia - replete per primary MD    No further inpatient CV recs.  Will see as needed while here. Follow-up with me in 4-6 weeks in the office   Hillis Range, MD 09/28/2012 8:07 AM

## 2012-09-28 NOTE — Progress Notes (Signed)
Family Medicine Teaching Service Daily Progress Note Intern Pager: [phone]  Patient name: [name] Medical record number: [id number] Date of birth: [birth year] Age: 77 y.o. Gender: male  Primary Care Provider: Tobin Chad, MD Consultants: cardiology Code Status: full  Assessment and Plan: [name] is a 76 y.o. year old male with hx of CAD and MI s/p CABG, atrial fibrillation with bundle branch block and bradycardia, HTN, HLD, and heavy alcohol use, presenting initially with chest pain.   # Atrial fibrillation with bradycardia - not on chronic anticoagulation due to alcohol use  - metoprolol held per cards due to bradycardia - has had several nonsustained runs of afib with RVR previously, several with reported masturbation - per tele overnight, has had multiple episodes of RVR up to 140s which were always just a few minutes in duration - Dr. Johney Frame of cardiology saw pt today and recommended d/c home without metoprolol, no need for holter monitor at this time. Will f/u as outpatient. [ ]  monitor HR on telemetry  # Heavy alcohol use - likely cause of hyponatremia (beer potomania)  - thiamine, folate, MVI  - CIWA protocol with seizure and fall precautions, scores 0 in first 48 hours but CIWA order fell off at 48 hrs - social work has seen patient and gave him community resources. He is not interested in referral at this time.  - does appear to be in possible withdrawal now (tremor, hallucinations).  [ ]  Will order CIWA protocol and monitor pt  # Chest pain - resolved, likely more anxiety related although hx is difficult to obtain. Does have significant cardiac hx.  - EKG on 6/26 showed accelerated ventricular rhythm, some signs of heart strain (ST depression) but troponins negative x 3 and pt denies being in pain currently  - continue aspirin 81mg  daily   # Hyponatremia - Most likely secondary to beer potomania. Stable at 129 from 123 on admission  - check BMET daily    # Hypokalemia  - replete PO today  # HTN - normotensive currently - now holding metoprolol due to bradycardia - on lisinopril 5mg  daily [ ]  f/u BP's   # Chronic back pain  - restart home tramadol if needed  # Depression/suicidal ideation: SI resolved, likely related to intoxication   # HLD: continue statin   FEN/GI: heart-healthy diet with 1800 mL fluid restriction, KVO IVF   Prophylaxis: subQ heparin (watch platelets since chronically runs low)   Disposition: Pending stable HR, alcohol withdrawal, floor status with telemetry  - needs HH PT/OT, already set up  - not ready for d/c yet as is actively withdrawing from alcohol and plans to not drink when he gets home  Code Status: Full  Subjective:  Pt somewhat disoriented this morning, reports he left the hospital and went home last night. Denies CP, SOB, palpitations. Does endorse tremor in his hands.  Objective: Temp:  [98 F (36.7 C)-98.7 F (37.1 C)] 98.2 F (36.8 C) (06/30 0434) Pulse Rate:  [66-80] 66 (06/30 0434) Resp:  [17-18] 17 (06/30 0434) BP: (113-132)/(61-78) 113/65 mmHg (06/30 0434) SpO2:  [95 %-98 %] 96 % (06/30 0434) Physical Exam: General: no acute distress, pleasant, oriented to person, place, year, gets date slightly off (despite delusion of having gone home), sitting up in chair Cardiovascular: difficult to auscultate heart sounds, radial pulse is mildly tachycardic with irregularly irregular rhythm Respiratory: normal resp effort, clear to auscultation bilaterally Abdomen: soft, non tender, non distended Extremities: no lower extremity edema, calves  NTTP Neuro: grossly nonfocal, speech intact, does have tremor in hands  Laboratory:  CBC  Recent Labs Lab 09/26/12 0434 09/27/12 0545 09/28/12 0426  WBC 3.1* 3.8* 3.9*  HGB 10.3* 10.2* 10.3*  HCT 29.4* 28.9* 29.1*  PLT 98* 98* 101*     BMET  Recent Labs Lab 09/26/12 0434 09/27/12 0545 09/28/12 0426  NA 131* 129* 129*  K 3.4* 3.5  3.3*  CL 96 96 97  CO2 27 25 25   BUN 13 12 13   CREATININE 0.89 0.80 0.76  GLUCOSE 109* 103* 96  CALCIUM 8.7 8.9 9.2       Imaging/Diagnostic Tests: DG Chest: 09/23/12 Comparison: 09/12/2012 and earlier.  Findings: Portable AP view at 1932 hours. Stable lung volumes.  Stable cardiac size and mediastinal contours. Stable sequelae of  CABG. Visualized tracheal air column is within normal limits. No  pneumothorax, pulmonary edema, pleural effusion or confluent  pulmonary opacity. Cervical carotid calcified atherosclerosis on  the left.  IMPRESSION:  No acute cardiopulmonary abnormality.   Latrelle Dodrill, MD 09/28/2012, 8:34 AM PGY-1, [facility] FPTS Intern pager: [phone]

## 2012-09-28 NOTE — Discharge Summary (Signed)
REFRESH AND UPDATE PRIOR TO SIGNING   Family Medicine Teaching [facility] Discharge Summary  Patient name: [name] Medical record number: [id number] Date of birth: 06-03-1935 Age: 77 y.o. Gender: male Date of Admission: 09/23/2012  Date of Discharge: 09/29/2012  Admitting Physician: Sanjuana Letters, MD  Primary Care Provider: Tobin Chad, MD Consultants: cardiology  Indication for Hospitalization: chest pain, intoxication  Discharge Diagnoses/Problem List:  1. Chest pain 2. Acute intoxication 3. Chronic alcohol abuse 4. Atrial fibrillation with intermittent RVR, also intermittent bradycardia 5. Hyponatremia 6. Hypokalemia 7. Hypertension 8. Chronic back pain 9. Depression/suicidal ideation 10. Hyperlipidemia  Disposition: to home with home health PT/OT  Discharge Condition: stable  Brief Hospital Course:   [name] is a 77 y.o. year old male with hx of CAD and MI s/p CABG, atrial fibrillation with bundle branch block and bradycardia, HTN, HLD, and heavy alcohol use, admitted to the hospital after presenting acutely intoxicated and presenting initially with chest pain.   # Atrial fibrillation with bradycardia - Patient is not on chronic anticoagulation due to hx of heavy alcohol abuse. Initially home metoprolol was continued but patient had sustained bradycardia, with EKGs concerning for accelerated ventricular rhythm. Cardiology was consulted and recommended stopping metoprolol and monitoring patient on telemetry. Patient did have several nonsustained runs of atrial fibrillation with RVR, but a few of these were noted to occur while patient was masturbating. Dr. Johney Frame of cardiology saw patient on 6/30 and recommended outpatient follow up, with no indication for holter monitor at time of discharge. Metoprolol was held at discharge and pt has an appointment scheduled with [facility] (see appt times below).  # Heavy alcohol use - This was likely  the cause of patient's hyponatremia (beer potomania). Pt was given thiamine, folate, and an MVI and monitored on CIWA protocol. Social work met with pt and offered resources, but patient stated he was aware of all resources available to him. On 6/30 he exhibited some behaviors concerning for withdrawal so was kept in the hospital for further monitoring. On 7/1, he had an episode of urinary incontinence but was without signs/symptoms of withdrawal and with CIWA scores 0-3 over the past 24 hours.  # Chest pain - Reported on admission, but was likely more related to anxiety than cardiac chest pain, as pt was acutely intoxicated upon presentation. Troponins were negative x 3. EKG showed some ST depression. Pt did not complain of chest pain after admission. We continued pt's home aspirin 81mg  daily.  # Hyponatremia - Most likely secondary to beer potomania. Was 123 on admission and gradually increased with fluid restriction to 133 on day of discharge.   # Hypokalemia - repleted orally and/or IV as needed  # HTN - metoprolol was held per cardiology recommendations (see above). Home lisinopril was continued.   # Chronic back pain - held home tramadol, restarted upon discharge. Stable during this hospitalization.  # Depression/suicidal ideation: Pt endorsed some SI upon admission, saying he wanted to go to sleep and never wake up. He denied any SI/HI after sobering up.   # HLD: continued home statin   Issues for Follow Up:  - pt will f/u with cardiology as outpatient for consideration of holter monitoring vs. pacer - recommend alcohol cessation - f/u BP's & pulse while off metoprolol  Significant Procedures: none  Significant Labs and Imaging:   Recent Labs Lab 09/26/12 0434 09/27/12 0545 09/28/12 0426  WBC 3.1* 3.8* 3.9*  HGB 10.3* 10.2* 10.3*  HCT  29.4* 28.9* 29.1*  PLT 98* 98* 101*    Recent Labs Lab 09/23/12 1832 09/23/12 1956  09/24/12 1836 09/25/12 0229 09/26/12 0434  09/27/12 0545 09/28/12 0426  NA 122*  --   < > 129* 130* 131* 129* 129*  K 3.2*  --   < > 4.1 4.1 3.4* 3.5 3.3*  CL 82*  --   < > 88* 91* 96 96 97  CO2 25  --   < > 22 27 27 25 25   GLUCOSE 72  --   < > 72 75 109* 103* 96  BUN 11  --   < > 10 12 13 12 13   CREATININE 0.71  --   < > 0.67 0.78 0.89 0.80 0.76  CALCIUM 8.4  --   < > 8.6 8.5 8.7 8.9 9.2  MG  --  1.4*  --   --   --   --   --   --   ALKPHOS 49  --   --   --   --   --   --   --   AST 29  --   --   --   --   --   --   --   ALT 15  --   --   --   --   --   --   --   ALBUMIN 3.2*  --   --   --   --   --   --   --   < > = values in this interval not displayed.   Outstanding Results: none  Discharge Medications:    Medication List    STOP taking these medications       metoprolol tartrate 12.5 mg Tabs  Commonly known as:  LOPRESSOR      TAKE these medications       aspirin EC 81 MG tablet  Take 81 mg by mouth daily.     Fish Oil 1200 MG Caps  Take 1 capsule by mouth daily.     lisinopril 5 MG tablet  Commonly known as:  PRINIVIL,ZESTRIL  Take 5 mg by mouth daily.     multivitamin with minerals Tabs  Take 1 tablet by mouth daily.     nitroGLYCERIN 0.4 MG SL tablet  Commonly known as:  NITROSTAT  Place 1 tablet (0.4 mg total) under the tongue every 5 (five) minutes x 3 doses as needed. For chest pain.     omeprazole 20 MG capsule  Commonly known as:  PRILOSEC  Take 20 mg by mouth daily.     oxybutynin 5 MG tablet  Commonly known as:  DITROPAN  Take 5 mg by mouth at bedtime.     simvastatin 20 MG tablet  Commonly known as:  ZOCOR  Take 20 mg by mouth every evening.     traMADol 50 MG tablet  Commonly known as:  ULTRAM  Take 1 tablet (50 mg total) by mouth every 8 (eight) hours as needed. For pain        Discharge Instructions: Please refer to Patient Instructions section of EMR for full details.  Patient was counseled important signs and symptoms that should prompt return to medical care, changes in  medications, dietary instructions, activity restrictions, and follow up appointments.   Follow-Up Appointments: Follow-up Information   Follow up with [facility]. (Home Health Physical Therapy)    Contact information:   [phone]      Follow up  with Tobin Chad, MD. (Wed and 3:30pm)    Contact information:   [address] [place] [address] [phone]       Follow up with Hillis Range, MD On 10/07/2012. (at 12:00pm)    Contact information:   [address] [place] [address] [phone]      Beverely Low, MD [facility] PGY-1 09/29/2012 1:24 PM

## 2012-09-29 LAB — BASIC METABOLIC PANEL
BUN: 18 mg/dL (ref 6–23)
Calcium: 9.3 mg/dL (ref 8.4–10.5)
Chloride: 100 mEq/L (ref 96–112)
Creatinine, Ser: 0.81 mg/dL (ref 0.50–1.35)
GFR calc Af Amer: >90 mL/min (ref >90)
GFR calc non Af Amer: 85 mL/min — ABNORMAL LOW (ref >90)

## 2012-09-29 LAB — CBC
HCT: 28.9 % — ABNORMAL LOW (ref 39.0–52.0)
MCHC: 34.9 g/dL (ref 30.0–36.0)
Platelets: 97 10*3/uL — ABNORMAL LOW (ref 150–400)
RDW: 12.7 % (ref 11.5–15.5)

## 2012-09-29 MED ORDER — LORAZEPAM 1 MG PO TABS: 1.0000 mg | ORAL_TABLET | Freq: Four times a day (QID) | ORAL | Status: DC | PRN

## 2012-09-29 NOTE — Discharge Summary (Signed)
I have reviewed this discharge summary and agree.    

## 2012-09-29 NOTE — Progress Notes (Signed)
Family Medicine Teaching Service Daily Progress Note Intern Pager: [phone]  Patient name: [name] Medical record number: [id number] Date of birth: [birth year] Age: 77 y.o. Gender: male  Primary Care Provider: Tobin Chad, MD Consultants: Cardiology  Code Status: Full  Pt Overview and Major Events to Date: [name] is a 77 y.o. year old male with hx of CAD and MI s/p CABG, atrial fibrillation with bundle branch block and bradycardia, HTN, HLD, and heavy alcohol use, presenting initially with chest pain.   # Atrial fibrillation with bradycardia - not on chronic anticoagulation due to alcohol use  - metoprolol held per cards due to bradycardia  - has had several nonsustained runs of afib with RVR previously, several with reported masturbation  - per tele overnight, has had 1 episode of RVR up to 180s which was 3-4 minutes in duration  - Dr. Johney Frame of cardiology recommended d/c home without metoprolol, no need for holter monitor at this time. Will f/u as outpatient.   # Heavy alcohol use - likely cause of hyponatremia (beer potomania)  - thiamine, folate, MVI  - CIWA protocol with seizure and fall precautions, scores 0 in first 48 hours but CIWA order fell off at 48 hrs  - social work has seen patient and gave him community resources. He is not interested in referral at this time.  - does not appear to be in withdrawal now - CIWA scores 0-3 in past 24 hours  # Chest pain - resolved, likely more anxiety related although hx is difficult to obtain. Does have significant cardiac hx.  - EKG on 6/26 showed accelerated ventricular rhythm, some signs of heart strain (ST depression) but troponins negative x 3 and pt denies being in pain currently  - continue aspirin 81mg  daily  # Hyponatremia - Most likely secondary to beer potomania. Stable at 133 from 123 on admission   # Hypokalemia  - stable 3.6 now  # HTN - normotensive currently  - now holding metoprolol due to  bradycardia  - on lisinopril 5mg  daily   # Chronic back pain  - not complaining of any pain currently, has tramadol at home   # Depression/suicidal ideation: SI resolved, likely related to intoxication   # HLD: continue statin  FEN/GI: heart-healthy diet with 1800 mL fluid restriction, KVO IVF  Prophylaxis: subQ heparin (watch platelets since chronically runs low)  Disposition:  Code Status: Full   Disposition: Home today - needs HH PT/OT  - cards f/u appt scheduled - CIWA scores 0-3, safe to go home    Subjective: Pt is calm and oriented this morning. Concerned about urinary incontinence episode. Denies CP, SOB, palpitations. Does endorse continued tremor in his hands.    Objective: Temp:  [98.2 F (36.8 C)-98.9 F (37.2 C)] 98.9 F (37.2 C) (07/01 0508) Pulse Rate:  [65-85] 71 (07/01 0508) Resp:  [16-18] 18 (07/01 0508) BP: (127-155)/(67-78) 127/73 mmHg (07/01 0508) SpO2:  [97 %-100 %] 100 % (07/01 0508) Physical Exam: General: no acute distress, pleasant, oriented x3, sitting up in bed, ate all breakfast  Cardiovascular: difficult to auscultate heart sounds, radial pulse is normal with irregularly irregular rhythm  Respiratory: normal resp effort, clear to auscultation bilaterally  Abdomen: soft, non tender, non distended  Extremities: no lower extremity edema, calves NTTP  Neuro: grossly nonfocal, speech intact, does have tremor in hands  Laboratory:  Recent Labs Lab 09/27/12 0545 09/28/12 0426 09/29/12 0420  WBC 3.8* 3.9* 3.5*  HGB 10.2* 10.3* 10.1*  HCT 28.9* 29.1* 28.9*  PLT 98* 101* 97*    Recent Labs Lab 09/23/12 1832  09/27/12 0545 09/28/12 0426 09/29/12 0420  NA 122*  < > 129* 129* 133*  K 3.2*  < > 3.5 3.3* 3.6  CL 82*  < > 96 97 100  CO2 25  < > 25 25 23   BUN 11  < > 12 13 18   CREATININE 0.71  < > 0.80 0.76 0.81  CALCIUM 8.4  < > 8.9 9.2 9.3  PROT 6.0  --   --   --   --   BILITOT 1.7*  --   --   --   --   ALKPHOS 49  --   --   --   --    ALT 15  --   --   --   --   AST 29  --   --   --   --   GLUCOSE 72  < > 103* 96 94  < > = values in this interval not displayed.  Imaging/Diagnostic Tests:  DG Chest: 09/23/12  Comparison: 09/12/2012 and earlier.  Findings: Portable AP view at 1932 hours. Stable lung volumes.  Stable cardiac size and mediastinal contours. Stable sequelae of  CABG. Visualized tracheal air column is within normal limits. No  pneumothorax, pulmonary edema, pleural effusion or confluent  pulmonary opacity. Cervical carotid calcified atherosclerosis on  the left.  IMPRESSION:  No acute cardiopulmonary abnormality.  Beverely Low, MD 09/29/2012, 9:38 AM PGY-1, [facility] Family Medicine FPTS Intern pager: [phone] text pages welcome

## 2012-09-29 NOTE — Progress Notes (Signed)
Family Medicine Teaching Service Attending Note  I interviewed and examined patient [name] and reviewed their tests and x-rays.  I discussed with Dr. Richarda Blade and reviewed their note for today.  I agree with their assessment and plan.     Additionally  Feels well this am Did not remember the tachy episode Ok to discharge His biggest challenge will be sobriety  If he can achieve this would reconsider anticoagulation and rate control

## 2012-09-30 ENCOUNTER — Ambulatory Visit: Payer: Medicare Other | Admitting: Sports Medicine

## 2012-10-04 ENCOUNTER — Observation Stay (HOSPITAL_COMMUNITY)
Admission: EM | Admit: 2012-10-04 | Discharge: 2012-10-05 | Disposition: A | Discharge status: Home / Self Care | Payer: Medicare Other | Attending: Family Medicine | Admitting: Family Medicine

## 2012-10-04 ENCOUNTER — Encounter (HOSPITAL_COMMUNITY): Payer: Self-pay | Admitting: Nurse Practitioner

## 2012-10-04 DIAGNOSIS — R079 Chest pain, unspecified: Secondary | ICD-10-CM | POA: Insufficient documentation

## 2012-10-04 DIAGNOSIS — F329 Major depressive disorder, single episode, unspecified: Secondary | ICD-10-CM | POA: Insufficient documentation

## 2012-10-04 DIAGNOSIS — Z9181 History of falling: Secondary | ICD-10-CM

## 2012-10-04 DIAGNOSIS — F10921 Alcohol use, unspecified with intoxication delirium: Secondary | ICD-10-CM

## 2012-10-04 DIAGNOSIS — Y92009 Unspecified place in unspecified non-institutional (private) residence as the place of occurrence of the external cause: Secondary | ICD-10-CM | POA: Insufficient documentation

## 2012-10-04 DIAGNOSIS — I10 Essential (primary) hypertension: Secondary | ICD-10-CM | POA: Insufficient documentation

## 2012-10-04 DIAGNOSIS — S5010XA Contusion of unspecified forearm, initial encounter: Secondary | ICD-10-CM | POA: Insufficient documentation

## 2012-10-04 DIAGNOSIS — W19XXXA Unspecified fall, initial encounter: Secondary | ICD-10-CM | POA: Insufficient documentation

## 2012-10-04 DIAGNOSIS — E871 Hypo-osmolality and hyponatremia: Secondary | ICD-10-CM | POA: Diagnosis present

## 2012-10-04 DIAGNOSIS — F1010 Alcohol abuse, uncomplicated: Principal | ICD-10-CM | POA: Insufficient documentation

## 2012-10-04 DIAGNOSIS — E876 Hypokalemia: Secondary | ICD-10-CM | POA: Insufficient documentation

## 2012-10-04 DIAGNOSIS — I2510 Atherosclerotic heart disease of native coronary artery without angina pectoris: Secondary | ICD-10-CM | POA: Insufficient documentation

## 2012-10-04 DIAGNOSIS — E785 Hyperlipidemia, unspecified: Secondary | ICD-10-CM | POA: Insufficient documentation

## 2012-10-04 DIAGNOSIS — F3289 Other specified depressive episodes: Secondary | ICD-10-CM | POA: Insufficient documentation

## 2012-10-04 DIAGNOSIS — M549 Dorsalgia, unspecified: Secondary | ICD-10-CM | POA: Insufficient documentation

## 2012-10-04 DIAGNOSIS — G8929 Other chronic pain: Secondary | ICD-10-CM | POA: Insufficient documentation

## 2012-10-04 DIAGNOSIS — I447 Left bundle-branch block, unspecified: Secondary | ICD-10-CM | POA: Diagnosis present

## 2012-10-04 DIAGNOSIS — I4891 Unspecified atrial fibrillation: Secondary | ICD-10-CM | POA: Insufficient documentation

## 2012-10-04 DIAGNOSIS — Z79899 Other long term (current) drug therapy: Secondary | ICD-10-CM | POA: Insufficient documentation

## 2012-10-04 DIAGNOSIS — Z951 Presence of aortocoronary bypass graft: Secondary | ICD-10-CM | POA: Insufficient documentation

## 2012-10-04 LAB — URINALYSIS, ROUTINE W REFLEX MICROSCOPIC
Bilirubin Urine: NEGATIVE
Hgb urine dipstick: NEGATIVE
Protein, ur: NEGATIVE mg/dL
Urobilinogen, UA: 0.2 mg/dL (ref 0.0–1.0)

## 2012-10-04 LAB — CBC
MCH: 32.9 pg (ref 26.0–34.0)
MCH: 33.1 pg (ref 26.0–34.0)
MCV: 91.4 fL (ref 78.0–100.0)
Platelets: 213 10*3/uL (ref 150–400)
Platelets: 198 10*3/uL (ref 150–400)
RBC: 3.5 MIL/uL — ABNORMAL LOW (ref 4.22–5.81)
RDW: 12.4 % (ref 11.5–15.5)

## 2012-10-04 LAB — RAPID URINE DRUG SCREEN, HOSP PERFORMED
Barbiturates: NOT DETECTED
Benzodiazepines: NOT DETECTED
Cocaine: NOT DETECTED
Tetrahydrocannabinol: NOT DETECTED

## 2012-10-04 LAB — COMPREHENSIVE METABOLIC PANEL
ALT: 35 U/L (ref 0–53)
AST: 51 U/L — ABNORMAL HIGH (ref 0–37)
CO2: 24 mEq/L (ref 19–32)
Calcium: 9 mg/dL (ref 8.4–10.5)
Sodium: 128 mEq/L — ABNORMAL LOW (ref 135–145)
Total Protein: 7.1 g/dL (ref 6.0–8.3)

## 2012-10-04 MED ORDER — SIMVASTATIN 20 MG PO TABS
20.0000 mg | ORAL_TABLET | Freq: Every evening | ORAL | Status: DC
  Filled 2012-10-04: qty 1

## 2012-10-04 MED ORDER — FOLIC ACID 1 MG PO TABS
1.0000 mg | ORAL_TABLET | Freq: Every day | ORAL | Status: DC
  Administered 2012-10-05: 1 mg via ORAL
  Filled 2012-10-04: qty 1

## 2012-10-04 MED ORDER — SODIUM CHLORIDE 0.9 % IV BOLUS (SEPSIS)
500.0000 mL | Freq: Once | INTRAVENOUS | Status: AC
  Administered 2012-10-04: 500 mL via INTRAVENOUS

## 2012-10-04 MED ORDER — LISINOPRIL 5 MG PO TABS
5.0000 mg | ORAL_TABLET | Freq: Every day | ORAL | Status: DC
  Administered 2012-10-05: 5 mg via ORAL
  Filled 2012-10-04: qty 1

## 2012-10-04 MED ORDER — ADULT MULTIVITAMIN W/MINERALS CH
1.0000 | ORAL_TABLET | Freq: Every day | ORAL | Status: DC
  Administered 2012-10-05: 1 via ORAL
  Filled 2012-10-04: qty 1

## 2012-10-04 MED ORDER — ASPIRIN EC 81 MG PO TBEC
81.0000 mg | DELAYED_RELEASE_TABLET | Freq: Every day | ORAL | Status: DC
  Administered 2012-10-05: 81 mg via ORAL
  Filled 2012-10-04: qty 1

## 2012-10-04 MED ORDER — POTASSIUM CHLORIDE CRYS ER 20 MEQ PO TBCR
40.0000 meq | EXTENDED_RELEASE_TABLET | Freq: Once | ORAL | Status: AC
  Administered 2012-10-04: 40 meq via ORAL
  Filled 2012-10-04: qty 2

## 2012-10-04 MED ORDER — LORAZEPAM 2 MG/ML IJ SOLN: 0.0000 mg | Freq: Two times a day (BID) | INTRAMUSCULAR | Status: DC

## 2012-10-04 MED ORDER — LORAZEPAM 2 MG/ML IJ SOLN: 0.0000 mg | Freq: Four times a day (QID) | INTRAMUSCULAR | Status: DC

## 2012-10-04 MED ORDER — PANTOPRAZOLE SODIUM 40 MG PO TBEC
40.0000 mg | DELAYED_RELEASE_TABLET | Freq: Every day | ORAL | Status: DC
  Administered 2012-10-05: 40 mg via ORAL
  Filled 2012-10-04: qty 1

## 2012-10-04 MED ORDER — DEXTROSE 50 % IV SOLN
50.0000 mL | Freq: Once | INTRAVENOUS | Status: AC
  Administered 2012-10-04: 50 mL via INTRAVENOUS
  Filled 2012-10-04: qty 50

## 2012-10-04 MED ORDER — SODIUM CHLORIDE 0.9 % IV SOLN: 250.0000 mL | INTRAVENOUS | Status: DC | PRN

## 2012-10-04 MED ORDER — LORAZEPAM 2 MG/ML IJ SOLN: 1.0000 mg | Freq: Four times a day (QID) | INTRAMUSCULAR | Status: DC | PRN

## 2012-10-04 MED ORDER — SODIUM CHLORIDE 0.9 % IJ SOLN: 3.0000 mL | INTRAMUSCULAR | Status: DC | PRN

## 2012-10-04 MED ORDER — LORAZEPAM 1 MG PO TABS
1.0000 mg | ORAL_TABLET | Freq: Four times a day (QID) | ORAL | Status: DC | PRN
  Administered 2012-10-04: 1 mg via ORAL
  Filled 2012-10-04: qty 1

## 2012-10-04 MED ORDER — HEPARIN SODIUM (PORCINE) 5000 UNIT/ML IJ SOLN
5000.0000 [IU] | Freq: Three times a day (TID) | INTRAMUSCULAR | Status: DC
  Administered 2012-10-04 – 2012-10-05 (×2): 5000 [IU] via SUBCUTANEOUS
  Filled 2012-10-04 (×5): qty 1

## 2012-10-04 MED ORDER — VITAMIN B-1 100 MG PO TABS
100.0000 mg | ORAL_TABLET | Freq: Every day | ORAL | Status: DC
  Administered 2012-10-05: 100 mg via ORAL
  Filled 2012-10-04: qty 1

## 2012-10-04 MED ORDER — SODIUM CHLORIDE 0.9 % IJ SOLN
3.0000 mL | Freq: Two times a day (BID) | INTRAMUSCULAR | Status: DC
  Administered 2012-10-04 – 2012-10-05 (×2): 3 mL via INTRAVENOUS

## 2012-10-04 MED ORDER — THIAMINE HCL 100 MG/ML IJ SOLN
100.0000 mg | Freq: Every day | INTRAMUSCULAR | Status: DC
  Filled 2012-10-04: qty 1

## 2012-10-04 MED ORDER — SODIUM CHLORIDE 0.9 % IJ SOLN: 3.0000 mL | Freq: Two times a day (BID) | INTRAMUSCULAR | Status: DC

## 2012-10-04 NOTE — ED Notes (Signed)
Pt unable to void at this time. 

## 2012-10-04 NOTE — ED Notes (Signed)
[facility] Called 

## 2012-10-04 NOTE — ED Notes (Addendum)
Pt urinated- in and out cath unnecessary.  Pt attempting to get out of bed twice and got up once and pulled leads off.  Pt asking for a drink of alcohol multiple times.  RN and NT helped pt back to bed.
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
## 2012-10-04 NOTE — ED Notes (Signed)
MVH:[id number]<GE> Expected date:<BR> Expected time:<BR> Means of arrival:<BR> Comments:<BR> 76-y-old male/ETOH abrasion

## 2012-10-04 NOTE — ED Notes (Signed)
Per Jorja Loa, RN:  Pt called EMS today.  EMS forced entry as pt couldn't come to the door.  Pt was found on the floor, so pt fell.  Pt has multiple bruises on arms; none seen on head.

## 2012-10-04 NOTE — ED Notes (Signed)
Pt aware of the need for a urine sample. Urinal at bedside. 
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
## 2012-10-05 DIAGNOSIS — I4891 Unspecified atrial fibrillation: Secondary | ICD-10-CM

## 2012-10-05 DIAGNOSIS — F1010 Alcohol abuse, uncomplicated: Secondary | ICD-10-CM

## 2012-10-05 DIAGNOSIS — F10231 Alcohol dependence with withdrawal delirium: Secondary | ICD-10-CM

## 2012-10-05 LAB — BASIC METABOLIC PANEL
BUN: 9 mg/dL (ref 6–23)
CO2: 28 mEq/L (ref 19–32)
Chloride: 95 mEq/L — ABNORMAL LOW (ref 96–112)
Glucose, Bld: 52 mg/dL — ABNORMAL LOW (ref 70–99)
Potassium: 4.1 mEq/L (ref 3.5–5.1)
Sodium: 135 mEq/L (ref 135–145)

## 2012-10-05 LAB — CREATININE, SERUM: Creatinine, Ser: 0.67 mg/dL (ref 0.50–1.35)

## 2012-10-05 MED ORDER — FLEET ENEMA 7-19 GM/118ML RE ENEM
1.0000 | ENEMA | Freq: Once | RECTAL | Status: AC
  Administered 2012-10-05: 1 via RECTAL
  Filled 2012-10-05: qty 1

## 2012-10-05 MED ORDER — POLYETHYLENE GLYCOL 3350 17 G PO PACK
17.0000 g | PACK | Freq: Every day | ORAL | Status: DC
  Administered 2012-10-05: 17 g via ORAL
  Filled 2012-10-05: qty 1

## 2012-10-05 MED ORDER — DOCUSATE SODIUM 100 MG PO CAPS
100.0000 mg | ORAL_CAPSULE | Freq: Every day | ORAL | Status: DC
  Administered 2012-10-05: 100 mg via ORAL
  Filled 2012-10-05: qty 1
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
## 2012-10-05 NOTE — Progress Notes (Signed)
FMTS Attending Admission Note: Marcus Spanbauer,MD I  have seen and examined this patient, reviewed their chart. I have discussed this patient with the resident. I agree with the resident's findings, assessment and care plan.  Patient awake and alert,only concern is constipation,he had not moved his bowel for 5 days which causes him some abdominal discomfort. He has been tolerating oral diet well,no N/V. He denies chest pain,no headache.  O/E: Awake and alert,not disoriented. He does has mild fine tremors of his hands. Neuro,Resp,CV otherwise normal.  I recommend stool softener for constipation and increase oral hydration, continue CIWA protocol,agree with IT sales professional for his alcoholism. I also counseled him on his alcoholism. Resident instructed to reassess his mental status and hand tremors before discharge home.

## 2012-10-05 NOTE — Progress Notes (Signed)
NURSING PROGRESS NOTE  [name] [id number] Discharge Data: 10/05/2012 6:37 PM Attending Provider: Carney Living, MD HQI:ONGE,XBMWU Greig Castilla, MD     [name] to be D/C'd Home per MD order.    All IV's discontinued with no bleeding noted.  All belongings returned to patient for patient to take home.   Last Vital Signs:  Blood pressure 152/91, pulse 105, temperature 99.3 F (37.4 C), temperature source Oral, resp. rate 18, height 5\' 11"  (1.803 m), weight 76.1 kg (167 lb 12.3 oz), SpO2 99.00%.  Discharge Medication List   Medication List         aspirin EC 81 MG tablet  Take 81 mg by mouth daily.     Fish Oil 1200 MG Caps  Take 1 capsule by mouth daily.     lisinopril 5 MG tablet  Commonly known as:  PRINIVIL,ZESTRIL  Take 5 mg by mouth daily.     LORazepam 1 MG tablet  Commonly known as:  ATIVAN  Take 1 tablet (1 mg total) by mouth every 6 (six) hours as needed for anxiety.     multivitamin with minerals Tabs  Take 1 tablet by mouth daily.     nitroGLYCERIN 0.4 MG SL tablet  Commonly known as:  NITROSTAT  Place 1 tablet (0.4 mg total) under the tongue every 5 (five) minutes x 3 doses as needed. For chest pain.     omeprazole 20 MG capsule  Commonly known as:  PRILOSEC  Take 20 mg by mouth daily.     oxybutynin 5 MG tablet  Commonly known as:  DITROPAN  Take 5 mg by mouth at bedtime.     simvastatin 20 MG tablet  Commonly known as:  ZOCOR  Take 20 mg by mouth every evening.     traMADol 50 MG tablet  Commonly known as:  ULTRAM  Take 1 tablet (50 mg total) by mouth every 8 (eight) hours as needed. For pain        Madelin Rear, MSN, RN, [facility]

## 2012-10-05 NOTE — Progress Notes (Signed)
Utilization review completed. Marayah Higdon, RN, BSN. 

## 2012-10-05 NOTE — Discharge Summary (Signed)
FMTS Attending Admission Note: Kehinde Eniola,MD I  have seen and examined this patient, reviewed their chart. I have discussed this patient with the resident. I agree with the resident's findings, assessment and care plan.  

## 2012-10-05 NOTE — H&P (Signed)
Family Medicine Teaching Service Attending Note  I interviewed and examined patient [name] and reviewed their tests and x-rays.  I discussed with Dr. Paulina Fusi and reviewed their note for today.  I agree with their assessment and plan.     Additionally  Awake alert feel hung over Complains of constipation - No chest pain or shortness of breath  Discussed the importance of a big change in his life to stop the alcohol.  I recommend in patient rehab  He will consider
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
## 2012-10-07 ENCOUNTER — Encounter: Payer: Medicare Other | Admitting: Internal Medicine

## 2012-10-08 ENCOUNTER — Other Ambulatory Visit: Payer: Self-pay | Admitting: *Deleted

## 2012-10-09 ENCOUNTER — Inpatient Hospital Stay: Payer: Medicare Other | Admitting: Family Medicine

## 2012-10-13 ENCOUNTER — Other Ambulatory Visit: Payer: Self-pay | Admitting: Family Medicine

## 2012-10-14 ENCOUNTER — Encounter: Payer: Medicare Other | Admitting: Internal Medicine

## 2012-10-20 ENCOUNTER — Emergency Department (HOSPITAL_COMMUNITY): Payer: Medicare Other

## 2012-10-20 ENCOUNTER — Encounter (HOSPITAL_COMMUNITY): Payer: Self-pay

## 2012-10-20 ENCOUNTER — Inpatient Hospital Stay (HOSPITAL_COMMUNITY)
Admission: EM | Admit: 2012-10-20 | Discharge: 2012-10-21 | DRG: 313 | Disposition: A | Discharge status: Home / Self Care | Payer: Medicare Other | Attending: Family Medicine | Admitting: Family Medicine

## 2012-10-20 DIAGNOSIS — E876 Hypokalemia: Secondary | ICD-10-CM | POA: Diagnosis present

## 2012-10-20 DIAGNOSIS — F1020 Alcohol dependence, uncomplicated: Secondary | ICD-10-CM | POA: Diagnosis present

## 2012-10-20 DIAGNOSIS — I5032 Chronic diastolic (congestive) heart failure: Secondary | ICD-10-CM | POA: Diagnosis present

## 2012-10-20 DIAGNOSIS — I509 Heart failure, unspecified: Secondary | ICD-10-CM | POA: Diagnosis present

## 2012-10-20 DIAGNOSIS — Z8546 Personal history of malignant neoplasm of prostate: Secondary | ICD-10-CM

## 2012-10-20 DIAGNOSIS — K2100 Gastro-esophageal reflux disease with esophagitis, without bleeding: Secondary | ICD-10-CM | POA: Diagnosis present

## 2012-10-20 DIAGNOSIS — I447 Left bundle-branch block, unspecified: Secondary | ICD-10-CM | POA: Diagnosis present

## 2012-10-20 DIAGNOSIS — F329 Major depressive disorder, single episode, unspecified: Secondary | ICD-10-CM | POA: Diagnosis present

## 2012-10-20 DIAGNOSIS — I4891 Unspecified atrial fibrillation: Secondary | ICD-10-CM | POA: Diagnosis present

## 2012-10-20 DIAGNOSIS — Z87891 Personal history of nicotine dependence: Secondary | ICD-10-CM

## 2012-10-20 DIAGNOSIS — M479 Spondylosis, unspecified: Secondary | ICD-10-CM | POA: Diagnosis present

## 2012-10-20 DIAGNOSIS — I252 Old myocardial infarction: Secondary | ICD-10-CM

## 2012-10-20 DIAGNOSIS — G252 Other specified forms of tremor: Secondary | ICD-10-CM | POA: Diagnosis present

## 2012-10-20 DIAGNOSIS — I2510 Atherosclerotic heart disease of native coronary artery without angina pectoris: Secondary | ICD-10-CM | POA: Diagnosis present

## 2012-10-20 DIAGNOSIS — D539 Nutritional anemia, unspecified: Secondary | ICD-10-CM | POA: Diagnosis present

## 2012-10-20 DIAGNOSIS — Z91199 Patient's noncompliance with other medical treatment and regimen due to unspecified reason: Secondary | ICD-10-CM

## 2012-10-20 DIAGNOSIS — Z951 Presence of aortocoronary bypass graft: Secondary | ICD-10-CM

## 2012-10-20 DIAGNOSIS — K5900 Constipation, unspecified: Secondary | ICD-10-CM | POA: Diagnosis present

## 2012-10-20 DIAGNOSIS — R0789 Other chest pain: Principal | ICD-10-CM | POA: Diagnosis present

## 2012-10-20 DIAGNOSIS — E871 Hypo-osmolality and hyponatremia: Secondary | ICD-10-CM | POA: Diagnosis present

## 2012-10-20 DIAGNOSIS — I10 Essential (primary) hypertension: Secondary | ICD-10-CM | POA: Diagnosis present

## 2012-10-20 DIAGNOSIS — E781 Pure hyperglyceridemia: Secondary | ICD-10-CM | POA: Diagnosis present

## 2012-10-20 DIAGNOSIS — F1010 Alcohol abuse, uncomplicated: Secondary | ICD-10-CM | POA: Diagnosis present

## 2012-10-20 DIAGNOSIS — R079 Chest pain, unspecified: Secondary | ICD-10-CM | POA: Diagnosis present

## 2012-10-20 DIAGNOSIS — F3289 Other specified depressive episodes: Secondary | ICD-10-CM | POA: Diagnosis present

## 2012-10-20 DIAGNOSIS — Z79899 Other long term (current) drug therapy: Secondary | ICD-10-CM

## 2012-10-20 DIAGNOSIS — Z9119 Patient's noncompliance with other medical treatment and regimen: Secondary | ICD-10-CM

## 2012-10-20 LAB — BASIC METABOLIC PANEL
CO2: 31 mEq/L (ref 19–32)
Chloride: 81 mEq/L — ABNORMAL LOW (ref 96–112)
Sodium: 125 mEq/L — ABNORMAL LOW (ref 135–145)

## 2012-10-20 LAB — CBC WITH DIFFERENTIAL/PLATELET
Eosinophils Relative: 2 % (ref 0–5)
HCT: 31.4 % — ABNORMAL LOW (ref 39.0–52.0)
Hemoglobin: 11.5 g/dL — ABNORMAL LOW (ref 13.0–17.0)
Lymphocytes Relative: 36 % (ref 12–46)
Lymphs Abs: 1.5 10*3/uL (ref 0.7–4.0)
MCV: 91.3 fL (ref 78.0–100.0)
Monocytes Absolute: 0.4 10*3/uL (ref 0.1–1.0)
RBC: 3.44 MIL/uL — ABNORMAL LOW (ref 4.22–5.81)
WBC: 4.2 10*3/uL (ref 4.0–10.5)

## 2012-10-20 MED ORDER — POTASSIUM CHLORIDE CRYS ER 20 MEQ PO TBCR
40.0000 meq | EXTENDED_RELEASE_TABLET | Freq: Once | ORAL | Status: AC
  Administered 2012-10-20: 40 meq via ORAL
  Filled 2012-10-20: qty 2

## 2012-10-20 MED ORDER — POTASSIUM CHLORIDE 10 MEQ/100ML IV SOLN
10.0000 meq | Freq: Once | INTRAVENOUS | Status: AC
  Administered 2012-10-20: 10 meq via INTRAVENOUS
  Filled 2012-10-20: qty 100

## 2012-10-20 MED ORDER — ASPIRIN 81 MG PO CHEW
324.0000 mg | CHEWABLE_TABLET | Freq: Once | ORAL | Status: AC
  Administered 2012-10-20: 243 mg via ORAL
  Filled 2012-10-20: qty 4

## 2012-10-20 NOTE — ED Notes (Signed)
EDP Zavitz made aware of pts potassium 2.5

## 2012-10-20 NOTE — ED Notes (Addendum)
Old and new EKG given to Dr Jodi Mourning

## 2012-10-20 NOTE — ED Notes (Addendum)
PER EMS: pt activated his life alert and told EMS "i have had two heart surgeries and I know when something is not right." pt denies CP but told EMS workers he has been drinking alcohol all day. Per ems pt is in a-fib and bundle branch block, unknown hx of such. VS: BP-114/65, HR-78 irregular, RR-18, O2-100  2L [place].

## 2012-10-21 DIAGNOSIS — F1010 Alcohol abuse, uncomplicated: Secondary | ICD-10-CM

## 2012-10-21 DIAGNOSIS — R079 Chest pain, unspecified: Secondary | ICD-10-CM

## 2012-10-21 DIAGNOSIS — I4891 Unspecified atrial fibrillation: Secondary | ICD-10-CM

## 2012-10-21 LAB — BASIC METABOLIC PANEL
Chloride: 87 mEq/L — ABNORMAL LOW (ref 96–112)
GFR calc Af Amer: >90 mL/min (ref >90)
GFR calc non Af Amer: >90 mL/min (ref >90)
Potassium: 2.8 mEq/L — ABNORMAL LOW (ref 3.5–5.1)
Sodium: 128 mEq/L — ABNORMAL LOW (ref 135–145)

## 2012-10-21 LAB — CBC
HCT: 26.8 % — ABNORMAL LOW (ref 39.0–52.0)
Hemoglobin: 9.6 g/dL — ABNORMAL LOW (ref 13.0–17.0)
RBC: 2.93 MIL/uL — ABNORMAL LOW (ref 4.22–5.81)
WBC: 2.5 10*3/uL — ABNORMAL LOW (ref 4.0–10.5)

## 2012-10-21 LAB — TSH: TSH: 1.596 u[IU]/mL (ref 0.350–4.500)

## 2012-10-21 MED ORDER — KCL IN DEXTROSE-NACL 40-5-0.9 MEQ/L-%-% IV SOLN
INTRAVENOUS | Status: DC
  Administered 2012-10-21: 03:00:00 via INTRAVENOUS
  Filled 2012-10-21 (×4): qty 1000

## 2012-10-21 MED ORDER — MAGNESIUM SULFATE 40 MG/ML IJ SOLN
2.0000 g | Freq: Once | INTRAMUSCULAR | Status: AC
  Administered 2012-10-21: 2 g via INTRAVENOUS
  Filled 2012-10-21: qty 50

## 2012-10-21 MED ORDER — SIMVASTATIN 20 MG PO TABS
20.0000 mg | ORAL_TABLET | Freq: Every evening | ORAL | Status: DC
  Filled 2012-10-21: qty 1

## 2012-10-21 MED ORDER — SODIUM CHLORIDE 0.9 % IJ SOLN
3.0000 mL | Freq: Two times a day (BID) | INTRAMUSCULAR | Status: DC
  Administered 2012-10-21: 3 mL via INTRAVENOUS

## 2012-10-21 MED ORDER — HEPARIN SODIUM (PORCINE) 5000 UNIT/ML IJ SOLN
5000.0000 [IU] | Freq: Three times a day (TID) | INTRAMUSCULAR | Status: DC
  Administered 2012-10-21 (×2): 5000 [IU] via SUBCUTANEOUS
  Filled 2012-10-21 (×4): qty 1

## 2012-10-21 MED ORDER — VITAMIN B-1 100 MG PO TABS
100.0000 mg | ORAL_TABLET | Freq: Every day | ORAL | Status: DC
  Administered 2012-10-21: 100 mg via ORAL
  Filled 2012-10-21: qty 1

## 2012-10-21 MED ORDER — THIAMINE HCL 100 MG/ML IJ SOLN
100.0000 mg | Freq: Every day | INTRAMUSCULAR | Status: DC
  Filled 2012-10-21: qty 1

## 2012-10-21 MED ORDER — LORAZEPAM 2 MG/ML IJ SOLN: 1.0000 mg | Freq: Four times a day (QID) | INTRAMUSCULAR | Status: DC | PRN

## 2012-10-21 MED ORDER — OXYBUTYNIN CHLORIDE 5 MG PO TABS
5.0000 mg | ORAL_TABLET | Freq: Every day | ORAL | Status: DC
  Administered 2012-10-21: 5 mg via ORAL
  Filled 2012-10-21 (×2): qty 1

## 2012-10-21 MED ORDER — DOCUSATE SODIUM 100 MG PO CAPS
100.0000 mg | ORAL_CAPSULE | Freq: Two times a day (BID) | ORAL | Status: DC
  Administered 2012-10-21: 100 mg via ORAL
  Filled 2012-10-21 (×2): qty 1

## 2012-10-21 MED ORDER — LORAZEPAM 1 MG PO TABS: 1.0000 mg | ORAL_TABLET | Freq: Four times a day (QID) | ORAL | Status: DC | PRN

## 2012-10-21 MED ORDER — MORPHINE SULFATE 2 MG/ML IJ SOLN: 2.0000 mg | INTRAMUSCULAR | Status: DC | PRN

## 2012-10-21 MED ORDER — POTASSIUM CHLORIDE 10 MEQ/100ML IV SOLN
10.0000 meq | INTRAVENOUS | Status: AC
  Administered 2012-10-21 (×3): 10 meq via INTRAVENOUS
  Filled 2012-10-21 (×6): qty 100

## 2012-10-21 MED ORDER — LISINOPRIL 5 MG PO TABS
5.0000 mg | ORAL_TABLET | Freq: Every day | ORAL | Status: DC
  Administered 2012-10-21: 5 mg via ORAL
  Filled 2012-10-21: qty 1

## 2012-10-21 MED ORDER — POTASSIUM CHLORIDE CRYS ER 20 MEQ PO TBCR
40.0000 meq | EXTENDED_RELEASE_TABLET | Freq: Every day | ORAL | Status: DC
  Administered 2012-10-21: 40 meq via ORAL
  Filled 2012-10-21: qty 2

## 2012-10-21 MED ORDER — ADULT MULTIVITAMIN W/MINERALS CH
1.0000 | ORAL_TABLET | Freq: Every day | ORAL | Status: DC
  Administered 2012-10-21: 1 via ORAL
  Filled 2012-10-21: qty 1

## 2012-10-21 MED ORDER — ONDANSETRON HCL 4 MG/2ML IJ SOLN: 4.0000 mg | Freq: Four times a day (QID) | INTRAMUSCULAR | Status: DC | PRN

## 2012-10-21 MED ORDER — ONDANSETRON HCL 4 MG PO TABS: 4.0000 mg | ORAL_TABLET | Freq: Four times a day (QID) | ORAL | Status: DC | PRN

## 2012-10-21 MED ORDER — FOLIC ACID 1 MG PO TABS
1.0000 mg | ORAL_TABLET | Freq: Every day | ORAL | Status: DC
  Administered 2012-10-21: 1 mg via ORAL
  Filled 2012-10-21: qty 1

## 2012-10-21 MED ORDER — ASPIRIN EC 81 MG PO TBEC
81.0000 mg | DELAYED_RELEASE_TABLET | Freq: Every day | ORAL | Status: DC
Start: 2012-10-21 — End: 2012-10-21
  Administered 2012-10-21: 81 mg via ORAL
  Filled 2012-10-21: qty 1

## 2012-10-21 MED ORDER — OMEGA-3-ACID ETHYL ESTERS 1 G PO CAPS
1.0000 g | ORAL_CAPSULE | Freq: Two times a day (BID) | ORAL | Status: DC
  Administered 2012-10-21: 1 g via ORAL
  Filled 2012-10-21 (×2): qty 1

## 2012-10-21 NOTE — Progress Notes (Signed)
Went over all discharge instructions including meds. No follow up appt scheduled. Pt states he comes to ED when needing to seek medical attention Information on alcohol intoxification given.

## 2012-10-21 NOTE — Progress Notes (Signed)
The patient arrived to [id number].  The patient was oriented to the unit and placed on telemetry.  VS were taken and the patient was assessed.  The call bell was placed within reach and the bed alarm was turned on.  He denies any pain at this time.  He is alert and oriented x4, but makes comments at inappropriate times.

## 2012-10-21 NOTE — Progress Notes (Signed)
Discussed in rounds.  Agree with Dr. Jarvis Newcomer.  Seen today by attending Jennette Kettle.  Given his asymptomatic state and the obvious repetitive nature of admissions, I agree with early DC.

## 2012-10-21 NOTE — Progress Notes (Signed)
I have seen and entered admission orders for this patient, but found out afterward that he is with the [facility] Medicine Teaching Service patient.  I spoke with the resident, gave report, and she will assume care for this nice gentleman.

## 2012-10-21 NOTE — H&P (Signed)
FMTS Attending Admission Note: Deshonda Cryderman MD [phone] pager office [phone] I  have seen and examined this patient, reviewed their chart. I have discussed this patient with the resident. I agree with the resident's findings, assessment and care plan. 

## 2012-10-21 NOTE — Progress Notes (Signed)
Utilization Review Completed Raul Torrance J. Yasuko Lapage, RN, BSN, NCM [phone]  

## 2012-10-21 NOTE — Progress Notes (Signed)
Family Medicine Teaching Service Daily Progress Note Intern Pager: [phone]  Patient name: [name] Medical record number: [id number] Date of birth: [birth year] Age: 77 y.o. Gender: male  Primary Care Provider: Tobin Chad, MD Consultants: None Code Status: Full  Pt Overview and Major Events to Date:   7/22: Patient brought in for observation   Assessment and Plan:  Chest Pain - Patient current does not report chest pain or remember having chest pain at arrival to the ED. This is his 3rd admission in 30 days for chest pain rule out. CXR wnl and troponin neg. Patient has left sided tenderness in mid-clavicular line on exam without any known history of trauma or fall.  - No telemetry events, troponins negative  - Risk stratification labs have been done within the last few weeks, no need to repeat  - ASA 81  - Continue home antihypertensives  - Morphine 2mg  prn pain   Alcohol abuse - No EtOH level done on admission, but patient reports drinking daily   - CIWA protocol  HTN - BP stable today.  - Continue home medications    Electrolyte abnormalities: secondary to alcohol abuse Mg: 1.2 K: up to 2.8 s/p 4 runs. Will continue to replete.   Hyponatremia - Recurrent problem likely secondary to alcohol use. Mild Hyponatremia on admission to Na 125 without neurologic symptoms  - fluid restrict to < 2 L for 12 hrs and then can increase accordingly  - Repeat BMet in AM to reassess.   Hypokalemia - s/p 4 runs of Kcl  - replete PO w/ KDur 40 meq x 1  - f/u BMET in AM   Constipation - Chronic problem for patient  - Miralax and dulcolax daily   Paroxsymal Afib - Known history of a fib, currently rate controlled  - No EKG on chart, will order now.  - Monitor on tele   FEN/GI: Fluid restriction, heart healthy diet  Prophylaxis: Heparin SQ    Disposition: D/C once stable  Subjective: Does not remember why he is here.  No chest pain/SOB  Objective: Temp:  [97.5 F  (36.4 C)-98.8 F (37.1 C)] 98.8 F (37.1 C) (07/23 0956) Pulse Rate:  [41-78] 74 (07/23 0956) Resp:  [14-20] 20 (07/23 0529) BP: (100-144)/(51-72) 144/72 mmHg (07/23 0956) SpO2:  [95 %-100 %] 100 % (07/23 0956) Weight:  [172 lb 4.8 oz (78.155 kg)] 172 lb 4.8 oz (78.155 kg) (07/23 0019) Physical Exam: General: 77 yo male in NAD Cardiovascular: RRR without murmur, 2+ pulses, no JVD, no edema. TTP on L chest Respiratory: nonlabored, CTAB Abdomen: Soft, NT, ND, NABS Extremities: moves all extremities, with scattered ecchymoses  Laboratory:  Recent Labs Lab 10/20/12 2031 10/21/12 0420  WBC 4.2 2.5*  HGB 11.5* 9.6*  HCT 31.4* 26.8*  PLT 141* 100*    Recent Labs Lab 10/20/12 2031 10/21/12 0420  NA 125* 128*  K 2.5* 2.8*  CL 81* 87*  CO2 31 30  BUN 8 7  CREATININE 0.72 0.70  CALCIUM 9.0 8.5  GLUCOSE 89 76   Mg: 1.2  Imaging/Diagnostic Tests:  Hazeline Junker, MD 10/21/2012, 12:15 PM PGY-1, [facility] FPTS Intern pager: [phone], text pages welcome

## 2012-10-21 NOTE — Progress Notes (Signed)
Pt discharged. Pt taken by wheelchair with all personal belongings. Niece will take pt home via private vehicle.

## 2012-10-21 NOTE — Progress Notes (Signed)
EKG completed per order and placed in chart

## 2012-10-21 NOTE — Progress Notes (Signed)
MD called and stated patient could be discharged after IV Magnesium and IV potassium has infused. Made patient aware. Pt states his niece may be able to come and get him. Pt alert and oriented and trying to get in contact with niece

## 2012-10-21 NOTE — Discharge Summary (Signed)
Family Medicine Teaching [facility] Discharge Summary  Patient name: [name] Medical record number: [id number] Date of birth: Jan 09, 1936 Age: 77 y.o. Gender: male Date of Admission: 10/20/2012  Date of Discharge: 10/21/2012 Admitting Physician: Nestor Ramp, MD  Primary Care Provider: Tobin Chad, MD Consultants: None  Indication for Hospitalization: Atypical chest pain  Discharge Diagnoses/Problem List:  Alcohol abuse Congestive heart failure Hypertension Hyponatremia Hypokalemia  Chronic constipation Paroxysmal atrial fibrillation  Disposition: D/C to home   Discharge Condition: Stable  Brief Hospital Course:  [name] is a 77 y.o. year old male presenting with chest pain. PMH is significant for alcohol abuse, HTN, angina, Afib, CHF.  Chest pain [name] was admitted on the evening of 7/22 after complaining of chest pain.  This was his third admission in 30 days for this complaint with multiple negative diagnostic work ups.  He smelled of alcohol in the ED, though no level was drawn, and has a history of alcohol abuse. He did not keep an appointment with [facility] cardiology the previous week to further address his chest pain.  Troponin, ECG, CXR were negative for acute ischemia or other cardiopulmonary process changed from baseline.  His chest pain had subsided by the following morning.  His electrolytes were repleted and he was discharged with the appropriate numbers to call to schedule follow-up appointments.   Issues for Follow Up:  Alcoholism and chronic medical non-compliance are the major barriers to wellness for this patient.   Significant Procedures: None  Significant Labs and Imaging:   Recent Labs Lab 10/20/12 2031 10/21/12 0420  WBC 4.2 2.5*  HGB 11.5* 9.6*  HCT 31.4* 26.8*  PLT 141* 100*    Recent Labs Lab 10/20/12 2031 10/21/12 0420  NA 125* 128*  K 2.5* 2.8*  CL 81* 87*  CO2 31 30  GLUCOSE 89 76  BUN 8 7  CREATININE 0.72  0.70  CALCIUM 9.0 8.5  MG 1.2*  --    CXR:  IMPRESSION:  No active cardiopulmonary disease. Cardiomegaly. ECG:  Atrial fibrillation with intraventricular conduction delay unchanged from previous tracings. No ischemic changes.    Outstanding Results:   Discharge Medications:    Medication List         aspirin EC 81 MG tablet  Take 81 mg by mouth daily.     Fish Oil 1200 MG Caps  Take 1 capsule by mouth daily.     hydrochlorothiazide 25 MG tablet  Commonly known as:  HYDRODIURIL  Take 25 mg by mouth daily.     lisinopril 5 MG tablet  Commonly known as:  PRINIVIL,ZESTRIL  Take 5 mg by mouth daily.     LORazepam 1 MG tablet  Commonly known as:  ATIVAN  Take 1 tablet (1 mg total) by mouth every 6 (six) hours as needed for anxiety.     multivitamin with minerals Tabs  Take 1 tablet by mouth daily.     nitroGLYCERIN 0.4 MG SL tablet  Commonly known as:  NITROSTAT  Place 1 tablet (0.4 mg total) under the tongue every 5 (five) minutes x 3 doses as needed. For chest pain.     oxybutynin 5 MG tablet  Commonly known as:  DITROPAN  Take 5 mg by mouth at bedtime.     simvastatin 20 MG tablet  Commonly known as:  ZOCOR  Take 20 mg by mouth every evening.     traMADol 50 MG tablet  Commonly known as:  ULTRAM  Take 1  tablet (50 mg total) by mouth every 8 (eight) hours as needed. For pain        Discharge Instructions: Please refer to Patient Instructions section of EMR for full details.  Patient was counseled important signs and symptoms that should prompt return to medical care, changes in medications, dietary instructions, activity restrictions, and follow up appointments.   Follow-Up Appointments:  Was given the following information and told to call for follow-up in the next 4 weeks: Dr. Zachery Dauer [address] [place] [address] [phone]   Hazeline Junker, MD 10/21/2012, 6:07 PM PGY-1, [facility] Family [facility]
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
## 2012-10-22 ENCOUNTER — Encounter (HOSPITAL_COMMUNITY): Payer: Self-pay | Admitting: Emergency Medicine

## 2012-10-22 ENCOUNTER — Emergency Department (HOSPITAL_COMMUNITY)
Admission: EM | Admit: 2012-10-22 | Discharge: 2012-10-24 | Disposition: A | Discharge status: Other Acute Inpatient Hospital | Payer: Medicare Other | Attending: Emergency Medicine | Admitting: Emergency Medicine

## 2012-10-22 DIAGNOSIS — Z8546 Personal history of malignant neoplasm of prostate: Secondary | ICD-10-CM | POA: Insufficient documentation

## 2012-10-22 DIAGNOSIS — Z951 Presence of aortocoronary bypass graft: Secondary | ICD-10-CM | POA: Insufficient documentation

## 2012-10-22 DIAGNOSIS — I2510 Atherosclerotic heart disease of native coronary artery without angina pectoris: Secondary | ICD-10-CM | POA: Diagnosis not present

## 2012-10-22 DIAGNOSIS — Z7982 Long term (current) use of aspirin: Secondary | ICD-10-CM | POA: Diagnosis not present

## 2012-10-22 DIAGNOSIS — Z79899 Other long term (current) drug therapy: Secondary | ICD-10-CM | POA: Insufficient documentation

## 2012-10-22 DIAGNOSIS — Z8719 Personal history of other diseases of the digestive system: Secondary | ICD-10-CM | POA: Diagnosis not present

## 2012-10-22 DIAGNOSIS — Z862 Personal history of diseases of the blood and blood-forming organs and certain disorders involving the immune mechanism: Secondary | ICD-10-CM | POA: Insufficient documentation

## 2012-10-22 DIAGNOSIS — Z87891 Personal history of nicotine dependence: Secondary | ICD-10-CM | POA: Diagnosis not present

## 2012-10-22 DIAGNOSIS — M479 Spondylosis, unspecified: Secondary | ICD-10-CM | POA: Insufficient documentation

## 2012-10-22 DIAGNOSIS — F1010 Alcohol abuse, uncomplicated: Secondary | ICD-10-CM | POA: Insufficient documentation

## 2012-10-22 DIAGNOSIS — Z9861 Coronary angioplasty status: Secondary | ICD-10-CM | POA: Insufficient documentation

## 2012-10-22 DIAGNOSIS — Z8669 Personal history of other diseases of the nervous system and sense organs: Secondary | ICD-10-CM | POA: Insufficient documentation

## 2012-10-22 DIAGNOSIS — I10 Essential (primary) hypertension: Secondary | ICD-10-CM | POA: Insufficient documentation

## 2012-10-22 DIAGNOSIS — Z8679 Personal history of other diseases of the circulatory system: Secondary | ICD-10-CM | POA: Diagnosis not present

## 2012-10-22 DIAGNOSIS — I252 Old myocardial infarction: Secondary | ICD-10-CM | POA: Insufficient documentation

## 2012-10-22 DIAGNOSIS — Z008 Encounter for other general examination: Secondary | ICD-10-CM | POA: Diagnosis present

## 2012-10-22 DIAGNOSIS — F10920 Alcohol use, unspecified with intoxication, uncomplicated: Secondary | ICD-10-CM

## 2012-10-22 LAB — CBC WITH DIFFERENTIAL/PLATELET
Basophils Absolute: 0 10*3/uL (ref 0.0–0.1)
Basophils Relative: 1 % (ref 0–1)
Eosinophils Absolute: 0.1 10*3/uL (ref 0.0–0.7)
HCT: 29.6 % — ABNORMAL LOW (ref 39.0–52.0)
Hemoglobin: 10.4 g/dL — ABNORMAL LOW (ref 13.0–17.0)
MCH: 32.7 pg (ref 26.0–34.0)
MCHC: 35.1 g/dL (ref 30.0–36.0)
Monocytes Absolute: 0.4 10*3/uL (ref 0.1–1.0)
Neutro Abs: 1.3 10*3/uL — ABNORMAL LOW (ref 1.7–7.7)
Neutrophils Relative %: 40 % — ABNORMAL LOW (ref 43–77)
RDW: 13.1 % (ref 11.5–15.5)

## 2012-10-22 LAB — COMPREHENSIVE METABOLIC PANEL
Albumin: 3.2 g/dL — ABNORMAL LOW (ref 3.5–5.2)
BUN: 5 mg/dL — ABNORMAL LOW (ref 6–23)
Calcium: 8.7 mg/dL (ref 8.4–10.5)
Creatinine, Ser: 0.69 mg/dL (ref 0.50–1.35)
GFR calc Af Amer: >90 mL/min (ref >90)
Glucose, Bld: 85 mg/dL (ref 70–99)
Total Protein: 6.5 g/dL (ref 6.0–8.3)

## 2012-10-22 LAB — RAPID URINE DRUG SCREEN, HOSP PERFORMED
Amphetamines: NOT DETECTED
Benzodiazepines: NOT DETECTED
Cocaine: NOT DETECTED
Opiates: NOT DETECTED

## 2012-10-22 MED ORDER — SODIUM CHLORIDE 0.9 % IV BOLUS (SEPSIS)
1000.0000 mL | INTRAVENOUS | Status: AC
  Administered 2012-10-22: 1000 mL via INTRAVENOUS

## 2012-10-22 MED ORDER — SODIUM CHLORIDE 0.9 % IV BOLUS (SEPSIS)
1000.0000 mL | INTRAVENOUS | Status: AC
  Administered 2012-10-23: 1000 mL via INTRAVENOUS
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
## 2012-10-22 NOTE — Discharge Summary (Signed)
Reviewed and agree with DC as outlined by Dr. Jarvis Newcomer.

## 2012-10-22 NOTE — ED Notes (Signed)
Pt via ems for etoh and suicidal thought without a plan
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
## 2012-10-22 NOTE — ED Notes (Signed)
AVW:[id number]<XB> Expected date:<BR> Expected time:<BR> Means of arrival:<BR> Comments:<BR> 76 SI/ETOH

## 2012-10-22 NOTE — ED Notes (Signed)
Pt asking SI sitter for a razor, stating, "I would cut my throat if they'd let me". Environment secure.

## 2012-10-23 DIAGNOSIS — F332 Major depressive disorder, recurrent severe without psychotic features: Secondary | ICD-10-CM

## 2012-10-23 DIAGNOSIS — F1010 Alcohol abuse, uncomplicated: Secondary | ICD-10-CM

## 2012-10-23 LAB — POCT I-STAT, CHEM 8
BUN: <3 mg/dL — ABNORMAL LOW (ref 6–23)
Calcium, Ion: 0.99 mmol/L — ABNORMAL LOW (ref 1.13–1.30)
Creatinine, Ser: 1.1 mg/dL (ref 0.50–1.35)
Glucose, Bld: 78 mg/dL (ref 70–99)
Sodium: 129 mEq/L — ABNORMAL LOW (ref 135–145)
TCO2: 23 mmol/L (ref 0–100)

## 2012-10-23 LAB — ETHANOL: Alcohol, Ethyl (B): <11 mg/dL (ref 0–11)

## 2012-10-23 MED ORDER — SIMVASTATIN 20 MG PO TABS
20.0000 mg | ORAL_TABLET | Freq: Every evening | ORAL | Status: DC
  Administered 2012-10-23: 20 mg via ORAL
  Filled 2012-10-23 (×2): qty 1

## 2012-10-23 MED ORDER — VITAMIN B-1 100 MG PO TABS
100.0000 mg | ORAL_TABLET | Freq: Every day | ORAL | Status: DC
  Administered 2012-10-23: 100 mg via ORAL
  Filled 2012-10-23: qty 1

## 2012-10-23 MED ORDER — LORAZEPAM 1 MG PO TABS
0.0000 mg | ORAL_TABLET | Freq: Four times a day (QID) | ORAL | Status: DC
Start: 2012-10-23 — End: 2012-10-24
  Administered 2012-10-23: 1 mg via ORAL
  Filled 2012-10-23: qty 1

## 2012-10-23 MED ORDER — ADULT MULTIVITAMIN W/MINERALS CH
1.0000 | ORAL_TABLET | Freq: Every day | ORAL | Status: DC
  Filled 2012-10-23: qty 1

## 2012-10-23 MED ORDER — IBUPROFEN 200 MG PO TABS: 600.0000 mg | ORAL_TABLET | Freq: Three times a day (TID) | ORAL | Status: DC | PRN

## 2012-10-23 MED ORDER — NICOTINE 21 MG/24HR TD PT24: 21.0000 mg | MEDICATED_PATCH | Freq: Every day | TRANSDERMAL | Status: DC

## 2012-10-23 MED ORDER — NITROGLYCERIN 0.4 MG SL SUBL: 0.4000 mg | SUBLINGUAL_TABLET | SUBLINGUAL | Status: DC | PRN

## 2012-10-23 MED ORDER — LORAZEPAM 1 MG PO TABS
1.0000 mg | ORAL_TABLET | Freq: Once | ORAL | Status: AC
  Administered 2012-10-23: 1 mg via ORAL
  Filled 2012-10-23: qty 1

## 2012-10-23 MED ORDER — THIAMINE HCL 100 MG/ML IJ SOLN: 100.0000 mg | Freq: Every day | INTRAMUSCULAR | Status: DC

## 2012-10-23 MED ORDER — LORAZEPAM 1 MG PO TABS: 1.0000 mg | ORAL_TABLET | Freq: Four times a day (QID) | ORAL | Status: DC | PRN

## 2012-10-23 MED ORDER — HYDROCHLOROTHIAZIDE 25 MG PO TABS
25.0000 mg | ORAL_TABLET | Freq: Every day | ORAL | Status: DC
  Administered 2012-10-23: 25 mg via ORAL
  Filled 2012-10-23 (×2): qty 1

## 2012-10-23 MED ORDER — ZOLPIDEM TARTRATE 5 MG PO TABS: 5.0000 mg | ORAL_TABLET | Freq: Every evening | ORAL | Status: DC | PRN

## 2012-10-23 MED ORDER — LORAZEPAM 1 MG PO TABS: 0.0000 mg | ORAL_TABLET | Freq: Two times a day (BID) | ORAL | Status: DC

## 2012-10-23 MED ORDER — ASPIRIN EC 81 MG PO TBEC
81.0000 mg | DELAYED_RELEASE_TABLET | Freq: Every day | ORAL | Status: DC
  Administered 2012-10-23: 81 mg via ORAL
  Filled 2012-10-23 (×2): qty 1

## 2012-10-23 MED ORDER — LISINOPRIL 5 MG PO TABS
5.0000 mg | ORAL_TABLET | Freq: Every day | ORAL | Status: DC
  Administered 2012-10-23: 5 mg via ORAL
  Filled 2012-10-23 (×2): qty 1

## 2012-10-23 MED ORDER — LORAZEPAM 2 MG/ML IJ SOLN: 1.0000 mg | Freq: Four times a day (QID) | INTRAMUSCULAR | Status: DC | PRN

## 2012-10-23 MED ORDER — TRAMADOL HCL 50 MG PO TABS: 50.0000 mg | ORAL_TABLET | Freq: Three times a day (TID) | ORAL | Status: DC | PRN

## 2012-10-23 MED ORDER — ADULT MULTIVITAMIN W/MINERALS CH
1.0000 | ORAL_TABLET | Freq: Every day | ORAL | Status: DC
  Administered 2012-10-23: 1 via ORAL

## 2012-10-23 MED ORDER — FOLIC ACID 1 MG PO TABS
1.0000 mg | ORAL_TABLET | Freq: Every day | ORAL | Status: DC
  Administered 2012-10-23: 1 mg via ORAL
  Filled 2012-10-23: qty 1

## 2012-10-23 MED ORDER — OXYBUTYNIN CHLORIDE 5 MG PO TABS
5.0000 mg | ORAL_TABLET | Freq: Every day | ORAL | Status: DC
  Administered 2012-10-23: 5 mg via ORAL
  Filled 2012-10-23 (×2): qty 1

## 2012-10-23 MED ORDER — ONDANSETRON HCL 4 MG PO TABS: 4.0000 mg | ORAL_TABLET | Freq: Three times a day (TID) | ORAL | Status: DC | PRN

## 2012-10-23 MED ORDER — ALUM & MAG HYDROXIDE-SIMETH 200-200-20 MG/5ML PO SUSP: 30.0000 mL | ORAL | Status: DC | PRN

## 2012-10-23 NOTE — ED Notes (Addendum)
Joni Reining Pisciotta,PA informed of pt's CIWA score from ACT team.

## 2012-10-23 NOTE — ED Notes (Signed)
Mariane Duval NT and Heeia NT attempted to assist pt in getting OOB.  Per Mariane Duval NT pt was unable to stand up and was unsteady.

## 2012-10-23 NOTE — BHH Counselor (Signed)
Writer informed nurse working with the patient that he has been accepted to [facility].  Nurse informed Clinical research associate that he has been having trouble standing.  Writer contacted Fannie Knee) at [facility] and informed her of the patients issues with standing. Fannie Knee will review the chart and speak to the nurse working with the patient before he is transported to the [facility] unit.

## 2012-10-23 NOTE — ED Provider Notes (Signed)
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 male with past medical history significant for atrial fibrillation, coronary artery disease, hypertension and alcohol abuse presenting with vague suicidal ideation and alcohol intoxication. Patient is found to be mildly hyponatremic at 126. Recheck shows improvement after fluid bolus to 129. Plan is to recheck the patient to evaluate his sobriety and his sincerity at wanting inpatient detox and suicidal ideation.  3:55 AM patient seen and examined at the bedside, he is sleeping comfortably. After waking him he is symptom-free. He denies suicidal ideation, homicidal ideation. He says that he would like to stop drinking but does not want inpatient treatment for her. He said that he is done this in the past and would like outpatient referrals today. I've advised him to consider his options and I will check back with him.  Patient is sober with normal reasoning, steady gait. He states that he is not at all suicidal, but would like inpatient detox.  Patient is medically cleared for psychiatric evaluation and treatment. Home medications ordered, CIWA protocol initiated, cycles and orders placed. Act team consulted.  Wynetta Emery, PA-C 10/25/12 [phone]

## 2012-10-23 NOTE — ED Notes (Signed)
Spoke to Ava ACT, pt is accepted at [facility] and is finishing paperwork.  She reports that pt was assessed at ~0400 this am.

## 2012-10-23 NOTE — ED Provider Notes (Signed)
Pt accepted to [facility] will transfer.  Laray Anger, DO 10/23/12 1429

## 2012-10-23 NOTE — ED Notes (Signed)
Pt would like to take his simvastatin before he goes to bed, held per pt.
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
## 2012-10-23 NOTE — ED Notes (Signed)
Pt is alert and oriented he is from home where he uses a walker to ambulate, he is here for detox from alcohol.  I have walker at bedside and patient has call bell in right hand and he acknowledges he knows how to use it.

## 2012-10-23 NOTE — BHH Counselor (Signed)
Writer informed Dr. working with the patient that he has been accepted to [facility].

## 2012-10-23 NOTE — ED Notes (Signed)
Pt resting comfortably with eyes closed.  Symmetrical rise and fall of chest noted.   

## 2012-10-23 NOTE — Consult Note (Signed)
[facility] Psychiatry Consult   Reason for Consult:  BAL of 345 and making suicidal threats Referring Physician:  ER MD  [name] is an 77 y.o. male.  Assessment: AXIS I:  Alcohol Abuse and Major Depression, Recurrent severe AXIS II:  Deferred AXIS III:   Past Medical History  Diagnosis Date  . Myocardial infarction 1985.1997  . Pancreatitis, alcoholic   . Osteoarthritis of spine 03/2005    thoracic and lumbar by x-ray  . Prostate carcinoma 06/2004    Gleason score 6  . Cerebral atrophy 10/2005    head CT  . Syncope 10/2005    vs seizure  . Bradycardia, sinus 07/2007    temporary pacing, alcohol intox  . Atrial fibrillation with rapid ventricular response 04/2009    new onset, alcoholism  . Coronary artery disease   . Hypertension   . Anemia    AXIS IV:  problems related to social environment and  says he is alone, broke up with his wife 2 yrs ago and has no one in his life AXIS V:  needs detox  Plan:  Recommend psychiatric Inpatient admission when medically cleared. Discussed crisis plan, support from social network, calling 911, coming to the Emergency Department, and calling Suicide Hotline. needs detox and treatment for his depression  Subjective:   [name] is a 77 y.o. male patient admitted with BAL of 345 and suicidal threats.  HPI:  [name] says he has been drinking all his life.  Went to rehab maybe 20 yrs ago but never stopped drinking.  Drinks daily but can't say how much but obviously a lot.  Says he has been depressed for years.  Lives alone, misses his last wife who left two years ago.  He still misses her and feels alone.  He is not close to any family member or friend.  He has had suicidal thinking for a long time and says if he were discharged that would not change.  He is a large risk due to gender, age, substance abuse, no support system.   HPI Elements:   Location:  currently ER. Quality:  heavy drinking and depression to point of SI. Severity:   severe. Timing:  all his life no precipitating incident. Duration:  all his life. Context:  is an alcoholic.  Past Psychiatric History: Past Medical History  Diagnosis Date  . Myocardial infarction 1985.1997  . Pancreatitis, alcoholic   . Osteoarthritis of spine 03/2005    thoracic and lumbar by x-ray  . Prostate carcinoma 06/2004    Gleason score 6  . Cerebral atrophy 10/2005    head CT  . Syncope 10/2005    vs seizure  . Bradycardia, sinus 07/2007    temporary pacing, alcohol intox  . Atrial fibrillation with rapid ventricular response 04/2009    new onset, alcoholism  . Coronary artery disease   . Hypertension   . Anemia     reports that he quit smoking about 24 years ago. His smoking use included Cigarettes. He smoked 0.00 packs per day for 20 years. He does not have any smokeless tobacco history on file. He reports that he drinks about 16.8 ounces of alcohol per week. He reports that he does not use illicit drugs. Family History  Problem Relation Age of Onset  . Cancer Father   . Cancer Sister    Family History Substance Abuse: Yes, Describe: Family Supports: No Living Arrangements: Alone Can pt return to current living arrangement?: Yes Abuse/Neglect [facility])  Physical Abuse: Denies Verbal Abuse: Denies Sexual Abuse: Denies Allergies:  No Known Allergies  Past Psychiatric History: Diagnosis:  Alcohol dependence and Major depression, recurrent, severe, non psychotic  Hospitalizations:  none  Outpatient Care:  none  Substance Abuse Care:  Alcohol only  Self-Mutilation:  None none   Suicidal Attempts:  none  Violent Behaviors:  none   Objective: Blood pressure 125/65, pulse 83, temperature 98.5 F (36.9 C), temperature source Oral, resp. rate 18, SpO2 99.00%.There is no weight on file to calculate BMI. Results for orders placed during the hospital encounter of 10/22/12 (from the past 72 hour(s))  CBC WITH DIFFERENTIAL     Status: Abnormal   Collection Time     10/22/12  7:30 PM      Result Value Range   WBC 3.2 (*) 4.0 - 10.5 K/uL   RBC 3.18 (*) 4.22 - 5.81 MIL/uL   Hemoglobin 10.4 (*) 13.0 - 17.0 g/dL   HCT 78.2 (*) 95.6 - 21.3 %   MCV 93.1  78.0 - 100.0 fL   MCH 32.7  26.0 - 34.0 pg   MCHC 35.1  30.0 - 36.0 g/dL   RDW 08.6  57.8 - 46.9 %   Platelets 118 (*) 150 - 400 K/uL   Comment: SPECIMEN CHECKED FOR CLOTS     REPEATED TO VERIFY   Neutrophils Relative % 40 (*) 43 - 77 %   Lymphocytes Relative 44  12 - 46 %   Monocytes Relative 11  3 - 12 %   Eosinophils Relative 4  0 - 5 %   Basophils Relative 1  0 - 1 %   Neutro Abs 1.3 (*) 1.7 - 7.7 K/uL   Lymphs Abs 1.4  0.7 - 4.0 K/uL   Monocytes Absolute 0.4  0.1 - 1.0 K/uL   Eosinophils Absolute 0.1  0.0 - 0.7 K/uL   Basophils Absolute 0.0  0.0 - 0.1 K/uL   Smear Review LARGE PLATELETS PRESENT    COMPREHENSIVE METABOLIC PANEL     Status: Abnormal   Collection Time    10/22/12  7:30 PM      Result Value Range   Sodium 126 (*) 135 - 145 mEq/L   Potassium 3.1 (*) 3.5 - 5.1 mEq/L   Chloride 88 (*) 96 - 112 mEq/L   CO2 25  19 - 32 mEq/L   Glucose, Bld 85  70 - 99 mg/dL   BUN 5 (*) 6 - 23 mg/dL   Creatinine, Ser 6.29  0.50 - 1.35 mg/dL   Calcium 8.7  8.4 - 52.8 mg/dL   Total Protein 6.5  6.0 - 8.3 g/dL   Albumin 3.2 (*) 3.5 - 5.2 g/dL   AST 25  0 - 37 U/L   ALT 12  0 - 53 U/L   Alkaline Phosphatase 54  39 - 117 U/L   Total Bilirubin 0.9  0.3 - 1.2 mg/dL   GFR calc non Af Amer >90  >90 mL/min   GFR calc Af Amer >90  >90 mL/min   Comment:            The eGFR has been calculated     using the CKD EPI equation.     This calculation has not been     validated in all clinical     situations.     eGFR's persistently     <90 mL/min signify     possible Chronic Kidney Disease.  ETHANOL     Status: Abnormal  Collection Time    10/22/12  7:30 PM      Result Value Range   Alcohol, Ethyl (B) 345 (*) 0 - 11 mg/dL   Comment:            LOWEST DETECTABLE LIMIT FOR     SERUM ALCOHOL IS 11  mg/dL     FOR MEDICAL PURPOSES ONLY  URINE RAPID DRUG SCREEN (HOSP PERFORMED)     Status: None   Collection Time    10/22/12  7:49 PM      Result Value Range   Opiates NONE DETECTED  NONE DETECTED   Cocaine NONE DETECTED  NONE DETECTED   Benzodiazepines NONE DETECTED  NONE DETECTED   Amphetamines NONE DETECTED  NONE DETECTED   Tetrahydrocannabinol NONE DETECTED  NONE DETECTED   Barbiturates NONE DETECTED  NONE DETECTED   Comment:            DRUG SCREEN FOR MEDICAL PURPOSES     ONLY.  IF CONFIRMATION IS NEEDED     FOR ANY PURPOSE, NOTIFY LAB     WITHIN 5 DAYS.                LOWEST DETECTABLE LIMITS     FOR URINE DRUG SCREEN     Drug Class       Cutoff (ng/mL)     Amphetamine      1000     Barbiturate      200     Benzodiazepine   200     Tricyclics       300     Opiates          300     Cocaine          300     THC              50  POCT I-STAT, CHEM 8     Status: Abnormal   Collection Time    10/23/12 12:34 AM      Result Value Range   Sodium 129 (*) 135 - 145 mEq/L   Potassium 3.5  3.5 - 5.1 mEq/L   Chloride 93 (*) 96 - 112 mEq/L   BUN <3 (*) 6 - 23 mg/dL   Creatinine, Ser 9.60  0.50 - 1.35 mg/dL   Glucose, Bld 78  70 - 99 mg/dL   Calcium, Ion 4.54 (*) 1.13 - 1.30 mmol/L   TCO2 23  0 - 100 mmol/L   Hemoglobin 9.5 (*) 13.0 - 17.0 g/dL   HCT 09.8 (*) 11.9 - 14.7 %   Labs are reviewed and are pertinent for alchol intoxication.  Current Facility-Administered Medications  Medication Dose Route Frequency Provider Last Rate Last Dose  . alum & mag hydroxide-simeth (MAALOX/MYLANTA) 200-200-20 MG/5ML suspension 30 mL  30 mL Oral PRN Nicole Pisciotta, PA-C      . aspirin EC tablet 81 mg  81 mg Oral Daily Nicole Pisciotta, PA-C   81 mg at 10/23/12 1011  . folic acid (FOLVITE) tablet 1 mg  1 mg Oral Daily Nicole Pisciotta, PA-C   1 mg at 10/23/12 1008  . hydrochlorothiazide (HYDRODIURIL) tablet 25 mg  25 mg Oral Daily Nicole Pisciotta, PA-C   25 mg at 10/23/12 1011  .  ibuprofen (ADVIL,MOTRIN) tablet 600 mg  600 mg Oral Q8H PRN Nicole Pisciotta, PA-C      . lisinopril (PRINIVIL,ZESTRIL) tablet 5 mg  5 mg Oral Daily Nicole Pisciotta, PA-C   5 mg at 10/23/12  1011  . LORazepam (ATIVAN) tablet 1 mg  1 mg Oral Q6H PRN Wynetta Emery, PA-C       Or  . LORazepam (ATIVAN) injection 1 mg  1 mg Intravenous Q6H PRN [employer], PA-C      . LORazepam (ATIVAN) tablet 0-4 mg  0-4 mg Oral Q6H Nicole Pisciotta, PA-C       Followed by  . [START ON 10/25/2012] LORazepam (ATIVAN) tablet 0-4 mg  0-4 mg Oral Q12H Nicole Pisciotta, PA-C      . LORazepam (ATIVAN) tablet 1 mg  1 mg Oral Q6H PRN Joni Reining Pisciotta, PA-C      . multivitamin with minerals tablet 1 tablet  1 tablet Oral Daily Nicole Pisciotta, PA-C      . multivitamin with minerals tablet 1 tablet  1 tablet Oral Daily Nicole Pisciotta, PA-C   1 tablet at 10/23/12 1007  . nicotine (NICODERM CQ - dosed in mg/24 hours) patch 21 mg  21 mg Transdermal Daily Nicole Pisciotta, PA-C      . nitroGLYCERIN (NITROSTAT) SL tablet 0.4 mg  0.4 mg Sublingual Q5 Min x 3 PRN Nicole Pisciotta, PA-C      . ondansetron (ZOFRAN) tablet 4 mg  4 mg Oral Q8H PRN Nicole Pisciotta, PA-C      . oxybutynin (DITROPAN) tablet 5 mg  5 mg Oral QHS Nicole Pisciotta, PA-C      . simvastatin (ZOCOR) tablet 20 mg  20 mg Oral QPM Nicole Pisciotta, PA-C      . thiamine (VITAMIN B-1) tablet 100 mg  100 mg Oral Daily Nicole Pisciotta, PA-C   100 mg at 10/23/12 1007   Or  . thiamine (B-1) injection 100 mg  100 mg Intravenous Daily Nicole Pisciotta, PA-C      . traMADol (ULTRAM) tablet 50 mg  50 mg Oral Q8H PRN Nicole Pisciotta, PA-C      . zolpidem (AMBIEN) tablet 5 mg  5 mg Oral QHS PRN Wynetta Emery, PA-C       Current Outpatient Prescriptions  Medication Sig Dispense Refill  . aspirin EC 81 MG tablet Take 81 mg by mouth daily.      . hydrochlorothiazide (HYDRODIURIL) 25 MG tablet Take 25 mg by mouth daily.      Marland Kitchen lisinopril (PRINIVIL,ZESTRIL) 5 MG  tablet Take 5 mg by mouth daily.      Marland Kitchen LORazepam (ATIVAN) 1 MG tablet Take 1 tablet (1 mg total) by mouth every 6 (six) hours as needed for anxiety.  5 tablet  0  . Multiple Vitamin (MULITIVITAMIN WITH MINERALS) TABS Take 1 tablet by mouth daily.       . nitroGLYCERIN (NITROSTAT) 0.4 MG SL tablet Place 1 tablet (0.4 mg total) under the tongue every 5 (five) minutes x 3 doses as needed. For chest pain.  20 tablet  11  . Omega-3 Fatty Acids (FISH OIL) 1200 MG CAPS Take 1 capsule by mouth daily.       Marland Kitchen oxybutynin (DITROPAN) 5 MG tablet Take 5 mg by mouth at bedtime.       . simvastatin (ZOCOR) 20 MG tablet Take 20 mg by mouth every evening.      . traMADol (ULTRAM) 50 MG tablet Take 1 tablet (50 mg total) by mouth every 8 (eight) hours as needed. For pain  90 tablet  11    Psychiatric Specialty Exam:     Blood pressure 125/65, pulse 83, temperature 98.5 F (36.9 C), temperature source Oral, resp. rate 18, SpO2  99.00%.There is no weight on file to calculate BMI.  General Appearance: Casual  Eye Contact::  Good  Speech:  Clear and Coherent and Normal Rate  Volume:  Normal  Mood:  Depressed  Affect:  Appropriate  Thought Process:  Negative  Orientation:  Full (Time, Place, and Person)  Thought Content:  Negative  Suicidal Thoughts:  Yes.  without intent/plan  Homicidal Thoughts:  No  Memory:  Immediate;   Good Recent;   Good Remote;   Good  Judgement:  Fair  Insight:  Fair  Psychomotor Activity:  Normal  Concentration:  Good  Recall:  Good  Akathisia:  Negative  Handed:  Right  AIMS (if indicated):     Assets:  Communication Skills Desire for Improvement Financial Resources/Insurance Housing Physical Health Transportation  Sleep:      Treatment Plan Summary: Daily contact with patient to assess and evaluate symptoms and progress in treatment Medication management admit to [facility] for detox and treatment of depression  Keria Widrig D 10/23/2012 11:37 AM

## 2012-10-23 NOTE — ED Provider Notes (Signed)
[name] is a 77 y.o. male Who was brought in by EMS for alcohol intoxication and suicidal ideation. Patient evaluated by me at 00:50. He is alert, communicative, but does not know why. He is here. He does not know where he came from. He denies suicidal ideation. He denies abdominal pain. When asked about bruising on his arms. He, states that he has it all the time. He denies fall.  Exam alert, calm, cooperative. Mild hypotension on initial testing. Abdomen is soft and nontender without masses. Scattered bruising on forearms bilaterally, without deformities.  Assessment: Intoxication with hyponatremia, and mild hypokalemia. He has chronic anemia. He was discharged from the hospital yesterday, after a short visit for chest pain. There is no clear indication for suicidal ideation. I doubt that the patient will need alcohol detoxification.  Plan: Observe patient until sober and then reassess his psychiatric status.  Medical screening examination/treatment/procedure(s) were conducted as a shared visit with non-physician practitioner(s) and myself.  I personally evaluated the patient during the encounter  Flint Melter, MD 10/27/12 1511

## 2012-10-23 NOTE — BHH Counselor (Addendum)
Patient has been accepted to [facility] Bed (301-2).  The accepting doctor is Dub Mikes).  Writer has completed pre-authorization for services from Medicare.    The patient is authorized from 7-25 to 10-26-2012.  Verbal authorization was given by the [facility] staff person.  Writer was informed by the [facility] staff person that the accepting facility will need to contact [place] at [phone] Ext [phone] for the authorization number.   Patient has signed support paperwork for admittance to [facility].  Support paperwork has been faxed to [facility].

## 2012-10-23 NOTE — ED Notes (Signed)
Pt is now wanting to receive in-patient detox help.  Marcus Emery, NP and writer asked pt 3 times to be sure.  Pt is still currently denying SI.

## 2012-10-23 NOTE — ED Notes (Signed)
Pt able to ambulate with a stand-by.

## 2012-10-24 ENCOUNTER — Encounter (HOSPITAL_COMMUNITY): Payer: Self-pay | Admitting: *Deleted

## 2012-10-24 ENCOUNTER — Inpatient Hospital Stay (HOSPITAL_COMMUNITY)
Admission: EM | Admit: 2012-10-24 | Discharge: 2012-10-29 | DRG: 897 | Disposition: A | Discharge status: Home / Self Care | Payer: 59 | Source: Intra-hospital | Attending: Psychiatry | Admitting: Psychiatry

## 2012-10-24 DIAGNOSIS — I5032 Chronic diastolic (congestive) heart failure: Secondary | ICD-10-CM

## 2012-10-24 DIAGNOSIS — E876 Hypokalemia: Secondary | ICD-10-CM | POA: Diagnosis present

## 2012-10-24 DIAGNOSIS — E781 Pure hyperglyceridemia: Secondary | ICD-10-CM | POA: Diagnosis present

## 2012-10-24 DIAGNOSIS — F1020 Alcohol dependence, uncomplicated: Principal | ICD-10-CM

## 2012-10-24 DIAGNOSIS — I447 Left bundle-branch block, unspecified: Secondary | ICD-10-CM | POA: Diagnosis present

## 2012-10-24 DIAGNOSIS — F329 Major depressive disorder, single episode, unspecified: Secondary | ICD-10-CM

## 2012-10-24 DIAGNOSIS — I10 Essential (primary) hypertension: Secondary | ICD-10-CM

## 2012-10-24 DIAGNOSIS — E871 Hypo-osmolality and hyponatremia: Secondary | ICD-10-CM

## 2012-10-24 DIAGNOSIS — F3289 Other specified depressive episodes: Secondary | ICD-10-CM

## 2012-10-24 DIAGNOSIS — F10239 Alcohol dependence with withdrawal, unspecified: Secondary | ICD-10-CM | POA: Diagnosis present

## 2012-10-24 DIAGNOSIS — F10939 Alcohol use, unspecified with withdrawal, unspecified: Secondary | ICD-10-CM

## 2012-10-24 DIAGNOSIS — I4891 Unspecified atrial fibrillation: Secondary | ICD-10-CM

## 2012-10-24 DIAGNOSIS — Z79899 Other long term (current) drug therapy: Secondary | ICD-10-CM

## 2012-10-24 DIAGNOSIS — F1010 Alcohol abuse, uncomplicated: Secondary | ICD-10-CM

## 2012-10-24 DIAGNOSIS — I2510 Atherosclerotic heart disease of native coronary artery without angina pectoris: Secondary | ICD-10-CM | POA: Diagnosis present

## 2012-10-24 MED ORDER — CHLORDIAZEPOXIDE HCL 25 MG PO CAPS: 25.0000 mg | ORAL_CAPSULE | Freq: Three times a day (TID) | ORAL | Status: DC

## 2012-10-24 MED ORDER — CHLORDIAZEPOXIDE HCL 25 MG PO CAPS
25.0000 mg | ORAL_CAPSULE | Freq: Every day | ORAL | Status: AC
  Administered 2012-10-27: 25 mg via ORAL
  Filled 2012-10-24: qty 1

## 2012-10-24 MED ORDER — LOPERAMIDE HCL 2 MG PO CAPS: 2.0000 mg | ORAL_CAPSULE | ORAL | Status: DC | PRN

## 2012-10-24 MED ORDER — CHLORDIAZEPOXIDE HCL 25 MG PO CAPS
25.0000 mg | ORAL_CAPSULE | Freq: Four times a day (QID) | ORAL | Status: AC
  Administered 2012-10-24 (×3): 25 mg via ORAL
  Filled 2012-10-24 (×3): qty 1

## 2012-10-24 MED ORDER — LORAZEPAM 1 MG PO TABS: 0.0000 mg | ORAL_TABLET | Freq: Four times a day (QID) | ORAL | Status: DC

## 2012-10-24 MED ORDER — CHLORDIAZEPOXIDE HCL 25 MG PO CAPS
25.0000 mg | ORAL_CAPSULE | Freq: Three times a day (TID) | ORAL | Status: AC
  Administered 2012-10-25 (×3): 25 mg via ORAL
  Filled 2012-10-24 (×3): qty 1

## 2012-10-24 MED ORDER — ASPIRIN EC 81 MG PO TBEC: 81.0000 mg | DELAYED_RELEASE_TABLET | Freq: Every day | ORAL | Status: DC

## 2012-10-24 MED ORDER — LISINOPRIL 5 MG PO TABS
5.0000 mg | ORAL_TABLET | Freq: Every day | ORAL | Status: DC
  Administered 2012-10-24: 5 mg via ORAL
  Filled 2012-10-24 (×2): qty 1

## 2012-10-24 MED ORDER — MAGNESIUM HYDROXIDE 400 MG/5ML PO SUSP: 30.0000 mL | Freq: Every day | ORAL | Status: DC | PRN

## 2012-10-24 MED ORDER — THIAMINE HCL 100 MG/ML IJ SOLN: 100.0000 mg | Freq: Every day | INTRAMUSCULAR | Status: DC

## 2012-10-24 MED ORDER — LORAZEPAM 1 MG PO TABS: 1.0000 mg | ORAL_TABLET | Freq: Two times a day (BID) | ORAL | Status: DC

## 2012-10-24 MED ORDER — ADULT MULTIVITAMIN W/MINERALS CH
1.0000 | ORAL_TABLET | Freq: Every day | ORAL | Status: DC
  Filled 2012-10-24 (×2): qty 1

## 2012-10-24 MED ORDER — THIAMINE HCL 100 MG/ML IJ SOLN: 100.0000 mg | Freq: Once | INTRAMUSCULAR | Status: DC

## 2012-10-24 MED ORDER — NITROGLYCERIN 0.4 MG SL SUBL: 0.4000 mg | SUBLINGUAL_TABLET | SUBLINGUAL | Status: DC | PRN

## 2012-10-24 MED ORDER — ONDANSETRON 4 MG PO TBDP: 4.0000 mg | ORAL_TABLET | Freq: Four times a day (QID) | ORAL | Status: AC | PRN

## 2012-10-24 MED ORDER — METOPROLOL TARTRATE 12.5 MG HALF TABLET
12.5000 mg | ORAL_TABLET | Freq: Two times a day (BID) | ORAL | Status: DC
  Administered 2012-10-24 – 2012-10-25 (×2): 12.5 mg via ORAL
  Filled 2012-10-24 (×6): qty 1

## 2012-10-24 MED ORDER — FOLIC ACID 1 MG PO TABS
1.0000 mg | ORAL_TABLET | Freq: Every day | ORAL | Status: DC
  Administered 2012-10-24 – 2012-10-29 (×6): 1 mg via ORAL
  Filled 2012-10-24 (×8): qty 1

## 2012-10-24 MED ORDER — LORAZEPAM 1 MG PO TABS: 1.0000 mg | ORAL_TABLET | Freq: Four times a day (QID) | ORAL | Status: DC | PRN

## 2012-10-24 MED ORDER — VITAMIN B-1 100 MG PO TABS
100.0000 mg | ORAL_TABLET | Freq: Every day | ORAL | Status: DC
  Administered 2012-10-25 – 2012-10-29 (×5): 100 mg via ORAL
  Filled 2012-10-24 (×7): qty 1

## 2012-10-24 MED ORDER — CHLORDIAZEPOXIDE HCL 25 MG PO CAPS: 25.0000 mg | ORAL_CAPSULE | ORAL | Status: DC

## 2012-10-24 MED ORDER — ASPIRIN EC 81 MG PO TBEC
81.0000 mg | DELAYED_RELEASE_TABLET | Freq: Every day | ORAL | Status: DC
  Administered 2012-10-24 – 2012-10-29 (×6): 81 mg via ORAL
  Filled 2012-10-24 (×9): qty 1

## 2012-10-24 MED ORDER — ALUM & MAG HYDROXIDE-SIMETH 200-200-20 MG/5ML PO SUSP: 30.0000 mL | ORAL | Status: DC | PRN

## 2012-10-24 MED ORDER — CHLORDIAZEPOXIDE HCL 25 MG PO CAPS: 25.0000 mg | ORAL_CAPSULE | Freq: Four times a day (QID) | ORAL | Status: DC | PRN

## 2012-10-24 MED ORDER — TRAMADOL HCL 50 MG PO TABS: 50.0000 mg | ORAL_TABLET | Freq: Three times a day (TID) | ORAL | Status: DC | PRN

## 2012-10-24 MED ORDER — CHLORDIAZEPOXIDE HCL 25 MG PO CAPS: 25.0000 mg | ORAL_CAPSULE | Freq: Every day | ORAL | Status: DC

## 2012-10-24 MED ORDER — LORAZEPAM 1 MG PO TABS: 1.0000 mg | ORAL_TABLET | ORAL | Status: DC

## 2012-10-24 MED ORDER — ZOLPIDEM TARTRATE 5 MG PO TABS: 5.0000 mg | ORAL_TABLET | Freq: Every evening | ORAL | Status: DC | PRN

## 2012-10-24 MED ORDER — METOPROLOL TARTRATE 50 MG PO TABS
ORAL_TABLET | ORAL | Status: AC
  Filled 2012-10-24: qty 1

## 2012-10-24 MED ORDER — LORAZEPAM 1 MG PO TABS: 0.0000 mg | ORAL_TABLET | Freq: Two times a day (BID) | ORAL | Status: DC

## 2012-10-24 MED ORDER — VITAMIN B-1 100 MG PO TABS
100.0000 mg | ORAL_TABLET | Freq: Every day | ORAL | Status: DC
  Administered 2012-10-24: 100 mg via ORAL
  Filled 2012-10-24 (×2): qty 1

## 2012-10-24 MED ORDER — ACETAMINOPHEN 325 MG PO TABS: 650.0000 mg | ORAL_TABLET | Freq: Four times a day (QID) | ORAL | Status: DC | PRN

## 2012-10-24 MED ORDER — SIMVASTATIN 20 MG PO TABS: 20.0000 mg | ORAL_TABLET | Freq: Every evening | ORAL | Status: DC

## 2012-10-24 MED ORDER — HYDROXYZINE HCL 25 MG PO TABS: 25.0000 mg | ORAL_TABLET | Freq: Four times a day (QID) | ORAL | Status: AC | PRN

## 2012-10-24 MED ORDER — ADULT MULTIVITAMIN W/MINERALS CH: 1.0000 | ORAL_TABLET | Freq: Every day | ORAL | Status: DC

## 2012-10-24 MED ORDER — ONDANSETRON 4 MG PO TBDP: 4.0000 mg | ORAL_TABLET | Freq: Four times a day (QID) | ORAL | Status: DC | PRN

## 2012-10-24 MED ORDER — HYDROXYZINE HCL 25 MG PO TABS: 25.0000 mg | ORAL_TABLET | Freq: Four times a day (QID) | ORAL | Status: DC | PRN

## 2012-10-24 MED ORDER — OXYBUTYNIN CHLORIDE 5 MG PO TABS
5.0000 mg | ORAL_TABLET | Freq: Every day | ORAL | Status: DC
  Administered 2012-10-24 – 2012-10-28 (×5): 5 mg via ORAL
  Filled 2012-10-24 (×7): qty 1

## 2012-10-24 MED ORDER — LORAZEPAM 1 MG PO TABS
1.0000 mg | ORAL_TABLET | Freq: Four times a day (QID) | ORAL | Status: DC
  Filled 2012-10-24: qty 1

## 2012-10-24 MED ORDER — CHLORDIAZEPOXIDE HCL 25 MG PO CAPS: 25.0000 mg | ORAL_CAPSULE | Freq: Four times a day (QID) | ORAL | Status: DC

## 2012-10-24 MED ORDER — CHLORDIAZEPOXIDE HCL 25 MG PO CAPS: 25.0000 mg | ORAL_CAPSULE | Freq: Four times a day (QID) | ORAL | Status: AC | PRN

## 2012-10-24 MED ORDER — LORAZEPAM 1 MG PO TABS: 1.0000 mg | ORAL_TABLET | Freq: Every day | ORAL | Status: DC

## 2012-10-24 MED ORDER — LORAZEPAM 1 MG PO TABS
1.0000 mg | ORAL_TABLET | Freq: Four times a day (QID) | ORAL | Status: DC | PRN
  Filled 2012-10-24: qty 1

## 2012-10-24 MED ORDER — ADULT MULTIVITAMIN W/MINERALS CH
1.0000 | ORAL_TABLET | Freq: Every day | ORAL | Status: DC
  Administered 2012-10-25 – 2012-10-29 (×5): 1 via ORAL
  Filled 2012-10-24 (×7): qty 1

## 2012-10-24 MED ORDER — CHLORDIAZEPOXIDE HCL 25 MG PO CAPS
25.0000 mg | ORAL_CAPSULE | ORAL | Status: AC
  Administered 2012-10-26 (×2): 25 mg via ORAL
  Filled 2012-10-24 (×2): qty 1

## 2012-10-24 MED ORDER — HYDROCHLOROTHIAZIDE 25 MG PO TABS
25.0000 mg | ORAL_TABLET | Freq: Every day | ORAL | Status: DC
  Administered 2012-10-24: 25 mg via ORAL
  Filled 2012-10-24 (×3): qty 1

## 2012-10-24 MED ORDER — ONDANSETRON 4 MG PO TBDP: 4.0000 mg | ORAL_TABLET | Freq: Three times a day (TID) | ORAL | Status: DC | PRN

## 2012-10-24 MED ORDER — SIMVASTATIN 20 MG PO TABS
20.0000 mg | ORAL_TABLET | Freq: Every evening | ORAL | Status: DC
  Administered 2012-10-24 – 2012-10-28 (×5): 20 mg via ORAL
  Filled 2012-10-24 (×7): qty 1

## 2012-10-24 MED ORDER — LORAZEPAM 2 MG/ML IJ SOLN: 1.0000 mg | Freq: Four times a day (QID) | INTRAMUSCULAR | Status: DC | PRN

## 2012-10-24 MED ORDER — CHLORDIAZEPOXIDE HCL 25 MG PO CAPS: 25.0000 mg | ORAL_CAPSULE | Freq: Once | ORAL | Status: DC

## 2012-10-24 MED ORDER — LORAZEPAM 1 MG PO TABS
2.0000 mg | ORAL_TABLET | Freq: Once | ORAL | Status: DC
  Filled 2012-10-24: qty 2

## 2012-10-24 MED ORDER — LOPERAMIDE HCL 2 MG PO CAPS: 2.0000 mg | ORAL_CAPSULE | ORAL | Status: AC | PRN

## 2012-10-24 MED ORDER — ADULT MULTIVITAMIN W/MINERALS CH
1.0000 | ORAL_TABLET | Freq: Every day | ORAL | Status: DC
  Administered 2012-10-24: 1 via ORAL
  Filled 2012-10-24 (×2): qty 1

## 2012-10-24 MED ORDER — OXYBUTYNIN CHLORIDE 5 MG PO TABS: 5.0000 mg | ORAL_TABLET | Freq: Every day | ORAL | Status: DC

## 2012-10-24 MED ORDER — NICOTINE 21 MG/24HR TD PT24
21.0000 mg | MEDICATED_PATCH | Freq: Every day | TRANSDERMAL | Status: DC
  Filled 2012-10-24 (×4): qty 1

## 2012-10-24 NOTE — [facility] Group Notes (Signed)
[facility] LCSW Group Therapy  10/24/2012 10:00 AM  Type of Therapy:  Group Therapy  Participation Level:  Did Not Attend   [name] 10/24/2012, 11:05 AM

## 2012-10-24 NOTE — Progress Notes (Signed)
Nursing Admit Note. Patient presents to [facility] Adult unit for admission for detox from alcohol. Patient reports drinking daily, a pint of vodka and around 9 beers daily. Patient presents disheveled, with anxious depressed mood.Affect appropriate to situation. Patient reports feeling depressed due to long abuse of alcohol and prior to admission reported vague , fleeting suicidal ideation to '' cut self with razor '' but upon admission denies any SI plan and is able to contract for safety verbally. Patient reports struggling with alcohol addiction for years, and his longest period sober was 14 months many years ago. Patient denies any HI/A/V Hallucinations. Patient oriented to unit. HIGH FALL RISK -uses walker to ambulate. CIWA protocol initiated. Patient currently calm and cooperative. Will continue to monitor q 15 minutes for safety.

## 2012-10-24 NOTE — Progress Notes (Signed)
Adult Psychoeducational Group Note  Date:  10/24/2012 Time:  6:57 PM  Group Topic/Focus:  Healthy Communication:   The focus of this group is to discuss communication, barriers to communication, as well as healthy ways to communicate with others.  Participation Level:  Did Not Attend   Modena Nunnery 10/24/2012, 6:57 PM

## 2012-10-24 NOTE — Tx Team (Signed)
Initial Interdisciplinary Treatment Plan  PATIENT STRENGTHS: (choose at least two) Ability for insight Capable of independent living General fund of knowledge  PATIENT STRESSORS: Substance abuse   PROBLEM LIST: Problem List/Patient Goals Date to be addressed Date deferred Reason deferred Estimated date of resolution  Polysubstance dep 10-24-12   D/c        Passive suicidal ideation 10-24-12   dc                                       DISCHARGE CRITERIA:  Ability to meet basic life and health needs Improved stabilization in mood, thinking, and/or behavior Verbal commitment to aftercare and medication compliance Withdrawal symptoms are absent or subacute and managed without 24-hour nursing intervention  PRELIMINARY DISCHARGE PLAN: Attend 12-step recovery group Return to previous living arrangement  PATIENT/FAMIILY INVOLVEMENT: This treatment plan has been presented to and reviewed with the patient, [name], and/or family member, .  The patient and family have been given the opportunity to ask questions and make suggestions.  Malva Limes 10/24/2012, 1:12 PM

## 2012-10-24 NOTE — BH Assessment (Addendum)
[facility] Assessment Progress Note      10/24/12. 0915.  Call from [facility] saying that pt was accepted to [facility] but they have now been told that pt cannot walk and would like other placement found. Daleen Squibb, LCSWA  10/24/12 0945.  [facility] nursing staff was preparing to send pt to [facility].  SW spoke with them re: walking issues.  They got pt out of bed and had him walk around the ED.  Pt was somewhat unsteady and kept a hand on RN should or railing for balance but is walking.  This was passed on to [place] at [facility], who said pt can come to [facility] for admit. Daleen Squibb, LCSWA

## 2012-10-24 NOTE — Progress Notes (Signed)
Pt resting in bed this evening, refused AA. Ambulating fairly well with walker though encouraged to call for staff assist. This writer helped pt contact family to notify of his admit, code number. CIWA is a 4, VSS. Supported, reassured. Medicated per orders. Fall precautions reviewed and strongly encouraged. Denies SI/HI/AVH and remains safe. Some confusion noted. "The doctor told me today that I could leave tonight and come back in the morning."

## 2012-10-24 NOTE — Progress Notes (Signed)
Patient did not attend the evening speaker AA meeting.  Pt said he would say the serenity prayer and then go to sleep.

## 2012-10-24 NOTE — [facility] Group Notes (Signed)
[facility] Group Notes:  (Nursing/MHT/Case Management/Adjunct)  Date: 10/24/2012  Time: 10:07 AM  Type of Therapy: self inventory review  Participation Level: Did Not Attend  Participation Quality: na  Affect: na  Cognitive: na  Insight: None  Engagement in Group: na  Modes of Intervention: na  Summary of Progress/Problems: self inventory review with RN did not attend  Rayley Gao 10/24/2012, 12:07 PM 
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
## 2012-10-25 DIAGNOSIS — F1020 Alcohol dependence, uncomplicated: Secondary | ICD-10-CM

## 2012-10-25 DIAGNOSIS — E871 Hypo-osmolality and hyponatremia: Secondary | ICD-10-CM

## 2012-10-25 DIAGNOSIS — I10 Essential (primary) hypertension: Secondary | ICD-10-CM

## 2012-10-25 DIAGNOSIS — F1994 Other psychoactive substance use, unspecified with psychoactive substance-induced mood disorder: Secondary | ICD-10-CM

## 2012-10-25 DIAGNOSIS — I5032 Chronic diastolic (congestive) heart failure: Secondary | ICD-10-CM

## 2012-10-25 HISTORY — DX: Alcohol dependence, uncomplicated: F10.20

## 2012-10-25 MED ORDER — METOPROLOL TARTRATE 12.5 MG HALF TABLET
12.5000 mg | ORAL_TABLET | Freq: Two times a day (BID) | ORAL | Status: DC
  Administered 2012-10-25 – 2012-10-29 (×8): 12.5 mg via ORAL
  Filled 2012-10-25 (×6): qty 1
  Filled 2012-10-25: qty 8
  Filled 2012-10-25 (×4): qty 1
  Filled 2012-10-25: qty 8

## 2012-10-25 NOTE — ED Provider Notes (Signed)
Medical screening examination/treatment/procedure(s) were performed by non-physician practitioner and as supervising physician I was immediately available for consultation/collaboration.  Jones Skene, M.D.  Jones Skene, MD 10/25/12 [id number]

## 2012-10-25 NOTE — [facility] Group Notes (Signed)
[facility] LCSW Group Therapy  10/25/2012   10:00 AM   Type of Therapy:  Group Therapy  Participation Level:  Did Not Attend  [name], LCSWA 10/25/2012 11:00 AM

## 2012-10-25 NOTE — H&P (Signed)
Psychiatric Admission Assessment Adult  Patient Identification:  [name]  Date of Evaluation:  10/25/2012  Chief Complaint:  Alcohol dependence 303.90  History of Present Illness: This is a 77 year old Caucasian male. Admitted to [facility] from the [facility] with complaints of increased alcohol consumption and suicidal ideation with plans to cut himself with a razor. Patient reports, "I don't remember much of anything to tell you the truth. I know I drink a lot of alcohol. I cannot tell you the amount of alcohol that I drink. I really don't go by that at all. I drink as much as I can, have done so all my life. I drink everyday. I live by myself. As a result, I drink whenever I want to and when I can. I have other health problems, and I'm on disability because of all my health problems. I don't normally go through the withdrawal symptoms because I don't let alcohol run low in my system. This time around, I drank so much that I became afraid that I might fall. So I called 911. I need help. I cannot continue like I have doing. I'm not depressed. Rather I have some anxiousness going on".  Elements:  Location:  [facility] adult unit. Quality:  Excessive alcohol consumption that led to suicidal ideation with plans to cut myself with a razor". Severity:  "Severe, I was drinking myself to death". Timing:  "My drinking problem is worsening by the day". Duration:  "I have been drinking since I was teenager". Context:  "I was not happy with my self and what I was doing to my health by drinking too much".  Associated Signs/Synptoms:  Depression Symptoms:  feelings of worthlessness/guilt, difficulty concentrating, hopelessness, suicidal thoughts with specific plan, anxiety, insomnia,  (Hypo) Manic Symptoms:  Impulsivity,  Anxiety Symptoms:  Excessive Worry,  Psychotic Symptoms:  Hallucinations: Denies  PTSD Symptoms: Had a traumatic exposure:  Denies  Psychiatric Specialty Exam: Physical  Exam  Constitutional: He appears well-developed.  HENT:  Head: Normocephalic.  Eyes: Pupils are equal, round, and reactive to light.  Neck: Normal range of motion.  Cardiovascular: Normal rate.   Respiratory: Effort normal.  GI: Soft.  Musculoskeletal: He exhibits tenderness (to joint areas).  Neurological: He is alert.  Alert and oriented x 2 (Self and place)  Skin: Skin is warm and dry.  Numerous bruising to arm areas  Psychiatric: His speech is normal. Judgment and thought content normal. His mood appears anxious (Rated at #3). He is slowed and withdrawn. Cognition and memory are impaired. He exhibits a depressed mood (Denies being and or feeling depressed). He exhibits abnormal recent memory.    Review of Systems  Constitutional: Negative.   HENT:       Hard of hearing   Eyes: Negative.   Respiratory: Negative.   Cardiovascular: Negative.        Hx. CAD  Gastrointestinal: Negative.   Genitourinary: Positive for urgency and frequency.  Musculoskeletal: Positive for myalgias.  Skin:       Large bruising to arm areas  Neurological: Negative.   Endo/Heme/Allergies: Bruises/bleeds easily.  Psychiatric/Behavioral: Positive for substance abuse (Alcoholism). Negative for depression, suicidal ideas, hallucinations and memory loss. The patient is nervous/anxious (Rated at #3) and has insomnia.     Blood pressure 116/70, pulse 79, temperature 98 F (36.7 C), temperature source Oral, resp. rate 22, height 5\' 9"  (1.753 m), weight 77.111 kg (170 lb).Body mass index is 25.09 kg/(m^2).  General Appearance: [employer]  Contact::  Fair  Speech:  Clear and Coherent  Volume:  Normal  Mood:  Anxious and denies being and or feeling depressed  Affect:  Flat  Thought Process:  Coherent, Goal Directed and Intact  Orientation:  Full (Time, Place, and Person)  Thought Content:  Rumination  Suicidal Thoughts:  No, but present on admission  Homicidal Thoughts:  No  Memory:  Immediate;    Good Recent;   Good Remote;   Fair  Judgement:  Impaired  Insight:  Fair  Psychomotor Activity:  Anxiousness  Concentration:  Fair  Recall:  Fair  Akathisia:  No  Handed:  Right  AIMS (if indicated):     Assets:  Desire for Improvement  Sleep:  Number of Hours: 4.25    Past Psychiatric History: Diagnosis: Alcohol dependence  Hospitalizations: [facility]  Outpatient Care: With Dr. Dimitri Ped  Substance Abuse Care: "[facility] a long time ago"  Self-Mutilation: Denies  Suicidal Attempts: Denies attempts and or thoughts  Violent Behaviors: None reported   Past Medical History:   Past Medical History  Diagnosis Date  . Myocardial infarction 1985.1997  . Pancreatitis, alcoholic   . Osteoarthritis of spine 03/2005    thoracic and lumbar by x-ray  . Prostate carcinoma 06/2004    Gleason score 6  . Cerebral atrophy 10/2005    head CT  . Syncope 10/2005    vs seizure  . Bradycardia, sinus 07/2007    temporary pacing, alcohol intox  . Atrial fibrillation with rapid ventricular response 04/2009    new onset, alcoholism  . Coronary artery disease   . Hypertension   . Anemia    Cardiac History:  CAD, HTN  Allergies:  No Known Allergies  PTA Medications: Prescriptions prior to admission  Medication Sig Dispense Refill  . aspirin EC 81 MG tablet Take 81 mg by mouth daily.      . hydrochlorothiazide (HYDRODIURIL) 25 MG tablet Take 25 mg by mouth daily.      Marland Kitchen lisinopril (PRINIVIL,ZESTRIL) 5 MG tablet Take 5 mg by mouth daily.      Marland Kitchen LORazepam (ATIVAN) 1 MG tablet Take 1 tablet (1 mg total) by mouth every 6 (six) hours as needed for anxiety.  5 tablet  0  . Multiple Vitamin (MULITIVITAMIN WITH MINERALS) TABS Take 1 tablet by mouth daily.       . Omega-3 Fatty Acids (FISH OIL) 1200 MG CAPS Take 1 capsule by mouth daily.       Marland Kitchen oxybutynin (DITROPAN) 5 MG tablet Take 5 mg by mouth at bedtime.       . simvastatin (ZOCOR) 20 MG tablet Take 20 mg by mouth every evening.      . traMADol  (ULTRAM) 50 MG tablet Take 1 tablet (50 mg total) by mouth every 8 (eight) hours as needed. For pain  90 tablet  11  . nitroGLYCERIN (NITROSTAT) 0.4 MG SL tablet Place 1 tablet (0.4 mg total) under the tongue every 5 (five) minutes x 3 doses as needed. For chest pain.  20 tablet  11    Previous Psychotropic Medications:  Medication/Dose    See medication lists             Substance Abuse History in the last 12 months:  yes  Consequences of Substance Abuse: Medical Consequences:  Liver damage, Possible death by overdose Legal Consequences:  Arrests, jail time, Loss of driving privilege. Family Consequences:  Family discord, divorce and or separation.  Social History:  reports that  he quit smoking about 24 years ago. His smoking use included Cigarettes. He smoked 0.00 packs per day for 20 years. He does not have any smokeless tobacco history on file. He reports that he drinks about 16.8 ounces of alcohol per week. He reports that he does not use illicit drugs. Additional Social History: Pain Medications: ultram  Prescriptions: ativan, nitro, aspirin, hctz, lisinopril, mv, omegta fish oil, oxybutnin, zocar  History of alcohol / drug use?: Yes Longest period of sobriety (when/how long): 14 months many yrs agoe Negative Consequences of Use: Financial;Personal relationships Withdrawal Symptoms: Agitation;Diarrhea;Irritability;Nausea / Vomiting;Tremors;Sweats;Weakness Name of Substance 1: etoh  1 - Age of First Use: teenagber 1 - Amount (size/oz): 6-9 beers daily and 1 pint of vodka daily  1 - Frequency: daily  1 - Duration: since late teens 1 - Last Use / Amount: prior to coming in the hopital  Current Place of Residence: [place], [place]  Place of Birth: [place], [place]  Family Members: "I have 3 grown children"  Marital Status:  Single  Children: 3  Sons: 2  Daughters: 1  Relationships: Single  Education:  HS Financial planner Problems/Performance: Completed High  school  Religious Beliefs/Practices: NA  History of Abuse (Emotional/Phsycial/Sexual): None reported  Occupational Experiences: Disabled  Hotel manager History:  Research scientist (physical sciences) History: None pending  Hobbies/Interests: NA  Family History:   Family History  Problem Relation Age of Onset  . Cancer Father   . Cancer Sister     Results for orders placed during the hospital encounter of 10/22/12 (from the past 72 hour(s))  CBC WITH DIFFERENTIAL     Status: Abnormal   Collection Time    10/22/12  7:30 PM      Result Value Range   WBC 3.2 (*) 4.0 - 10.5 K/uL   RBC 3.18 (*) 4.22 - 5.81 MIL/uL   Hemoglobin 10.4 (*) 13.0 - 17.0 g/dL   HCT 16.1 (*) 09.6 - 04.5 %   MCV 93.1  78.0 - 100.0 fL   MCH 32.7  26.0 - 34.0 pg   MCHC 35.1  30.0 - 36.0 g/dL   RDW 40.9  81.1 - 91.4 %   Platelets 118 (*) 150 - 400 K/uL   Comment: SPECIMEN CHECKED FOR CLOTS     REPEATED TO VERIFY   Neutrophils Relative % 40 (*) 43 - 77 %   Lymphocytes Relative 44  12 - 46 %   Monocytes Relative 11  3 - 12 %   Eosinophils Relative 4  0 - 5 %   Basophils Relative 1  0 - 1 %   Neutro Abs 1.3 (*) 1.7 - 7.7 K/uL   Lymphs Abs 1.4  0.7 - 4.0 K/uL   Monocytes Absolute 0.4  0.1 - 1.0 K/uL   Eosinophils Absolute 0.1  0.0 - 0.7 K/uL   Basophils Absolute 0.0  0.0 - 0.1 K/uL   Smear Review LARGE PLATELETS PRESENT    COMPREHENSIVE METABOLIC PANEL     Status: Abnormal   Collection Time    10/22/12  7:30 PM      Result Value Range   Sodium 126 (*) 135 - 145 mEq/L   Potassium 3.1 (*) 3.5 - 5.1 mEq/L   Chloride 88 (*) 96 - 112 mEq/L   CO2 25  19 - 32 mEq/L   Glucose, Bld 85  70 - 99 mg/dL   BUN 5 (*) 6 - 23 mg/dL   Creatinine, Ser 7.82  0.50 - 1.35 mg/dL  Calcium 8.7  8.4 - 10.5 mg/dL   Total Protein 6.5  6.0 - 8.3 g/dL   Albumin 3.2 (*) 3.5 - 5.2 g/dL   AST 25  0 - 37 U/L   ALT 12  0 - 53 U/L   Alkaline Phosphatase 54  39 - 117 U/L   Total Bilirubin 0.9  0.3 - 1.2 mg/dL   GFR calc non Af Amer >90  >90 mL/min    GFR calc Af Amer >90  >90 mL/min   Comment:            The eGFR has been calculated     using the CKD EPI equation.     This calculation has not been     validated in all clinical     situations.     eGFR's persistently     <90 mL/min signify     possible Chronic Kidney Disease.  ETHANOL     Status: Abnormal   Collection Time    10/22/12  7:30 PM      Result Value Range   Alcohol, Ethyl (B) 345 (*) 0 - 11 mg/dL   Comment:            LOWEST DETECTABLE LIMIT FOR     SERUM ALCOHOL IS 11 mg/dL     FOR MEDICAL PURPOSES ONLY  URINE RAPID DRUG SCREEN (HOSP PERFORMED)     Status: None   Collection Time    10/22/12  7:49 PM      Result Value Range   Opiates NONE DETECTED  NONE DETECTED   Cocaine NONE DETECTED  NONE DETECTED   Benzodiazepines NONE DETECTED  NONE DETECTED   Amphetamines NONE DETECTED  NONE DETECTED   Tetrahydrocannabinol NONE DETECTED  NONE DETECTED   Barbiturates NONE DETECTED  NONE DETECTED   Comment:            DRUG SCREEN FOR MEDICAL PURPOSES     ONLY.  IF CONFIRMATION IS NEEDED     FOR ANY PURPOSE, NOTIFY LAB     WITHIN 5 DAYS.                LOWEST DETECTABLE LIMITS     FOR URINE DRUG SCREEN     Drug Class       Cutoff (ng/mL)     Amphetamine      1000     Barbiturate      200     Benzodiazepine   200     Tricyclics       300     Opiates          300     Cocaine          300     THC              50  POCT I-STAT, CHEM 8     Status: Abnormal   Collection Time    10/23/12 12:34 AM      Result Value Range   Sodium 129 (*) 135 - 145 mEq/L   Potassium 3.5  3.5 - 5.1 mEq/L   Chloride 93 (*) 96 - 112 mEq/L   BUN <3 (*) 6 - 23 mg/dL   Creatinine, Ser 9.52  0.50 - 1.35 mg/dL   Glucose, Bld 78  70 - 99 mg/dL   Calcium, Ion 8.41 (*) 1.13 - 1.30 mmol/L   TCO2 23  0 - 100 mmol/L   Hemoglobin 9.5 (*) 13.0 - 17.0 g/dL  HCT 28.0 (*) 39.0 - 52.0 %  ETHANOL     Status: None   Collection Time    10/23/12  2:40 PM      Result Value Range   Alcohol, Ethyl (B)  <11  0 - 11 mg/dL   Comment:            LOWEST DETECTABLE LIMIT FOR     SERUM ALCOHOL IS 11 mg/dL     FOR MEDICAL PURPOSES ONLY   Psychological Evaluations:  Assessment:   AXIS I:  Substance Induced Mood Disorder and Alcohol dependence AXIS II:  Deferred AXIS III:   Past Medical History  Diagnosis Date  . Myocardial infarction 1985.1997  . Pancreatitis, alcoholic   . Osteoarthritis of spine 03/2005    thoracic and lumbar by x-ray  . Prostate carcinoma 06/2004    Gleason score 6  . Cerebral atrophy 10/2005    head CT  . Syncope 10/2005    vs seizure  . Bradycardia, sinus 07/2007    temporary pacing, alcohol intox  . Atrial fibrillation with rapid ventricular response 04/2009    new onset, alcoholism  . Coronary artery disease   . Hypertension   . Anemia    AXIS IV:  problems with primary support group and Alcoholism AXIS V:  1-10 persistent dangerousness to self and others present  Treatment Plan/Recommendations: 1. Admit for crisis management and stabilization, estimated length of stay 3-5 days.  2. Medication management to reduce current symptoms to base line and improve the patient's overall level of functioning  3. Treat health problems as indicated.  4. Develop treatment plan to decrease risk of relapse upon discharge and the need for readmission.  5. Psycho-social education regarding relapse prevention and self care.  6. Health care follow up as needed for medical problems.  7. Review, reconcile, and reinstate any pertinent home medications for other health issues where appropriate. 8. Call for consults with hospitalist for any additional specialty patient care services as needed.  Treatment Plan Summary: Daily contact with patient to assess and evaluate symptoms and progress in treatment Medication management  Current Medications:  Current Facility-Administered Medications  Medication Dose Route Frequency Provider Last Rate Last Dose  . acetaminophen (TYLENOL)  tablet 650 mg  650 mg Oral Q6H PRN Earney Navy, NP      . alum & mag hydroxide-simeth (MAALOX/MYLANTA) 200-200-20 MG/5ML suspension 30 mL  30 mL Oral Q4H PRN Earney Navy, NP      . aspirin EC tablet 81 mg  81 mg Oral Daily Earney Navy, NP   81 mg at 10/25/12 0844  . chlordiazePOXIDE (LIBRIUM) capsule 25 mg  25 mg Oral Q6H PRN Rachael Fee, MD      . chlordiazePOXIDE (LIBRIUM) capsule 25 mg  25 mg Oral TID Rachael Fee, MD   25 mg at 10/25/12 0845   Followed by  . [START ON 10/26/2012] chlordiazePOXIDE (LIBRIUM) capsule 25 mg  25 mg Oral BH-qamhs Rachael Fee, MD       Followed by  . [START ON 10/27/2012] chlordiazePOXIDE (LIBRIUM) capsule 25 mg  25 mg Oral Daily Rachael Fee, MD      . folic acid (FOLVITE) tablet 1 mg  1 mg Oral Daily Earney Navy, NP   1 mg at 10/25/12 0845  . hydrOXYzine (ATARAX/VISTARIL) tablet 25 mg  25 mg Oral Q6H PRN Rachael Fee, MD      . loperamide (IMODIUM) capsule 2-4 mg  2-4 mg Oral PRN Rachael Fee, MD      . magnesium hydroxide (MILK OF MAGNESIA) suspension 30 mL  30 mL Oral Daily PRN Earney Navy, NP      . metoprolol tartrate (LOPRESSOR) tablet 12.5 mg  12.5 mg Oral BID Rachael Fee, MD   12.5 mg at 10/25/12 0849  . multivitamin with minerals tablet 1 tablet  1 tablet Oral Daily Marinda Elk, MD   1 tablet at 10/25/12 0845  . nicotine (NICODERM CQ - dosed in mg/24 hours) patch 21 mg  21 mg Transdermal Q0600 Earney Navy, NP      . nitroGLYCERIN (NITROSTAT) SL tablet 0.4 mg  0.4 mg Sublingual Q5 Min x 3 PRN Earney Navy, NP      . ondansetron (ZOFRAN-ODT) disintegrating tablet 4 mg  4 mg Oral Q6H PRN Rachael Fee, MD      . oxybutynin (DITROPAN) tablet 5 mg  5 mg Oral QHS Earney Navy, NP   5 mg at 10/24/12 2130  . simvastatin (ZOCOR) tablet 20 mg  20 mg Oral QPM Earney Navy, NP   20 mg at 10/24/12 1734  . thiamine (B-1) injection 100 mg  100 mg Intramuscular Once Marinda Elk, MD      .  thiamine (VITAMIN B-1) tablet 100 mg  100 mg Oral Daily Marinda Elk, MD   100 mg at 10/25/12 0845  . traMADol (ULTRAM) tablet 50 mg  50 mg Oral Q8H PRN Earney Navy, NP      . zolpidem (AMBIEN) tablet 5 mg  5 mg Oral QHS PRN Earney Navy, NP        Observation Level/Precautions:  15 minute checks  Laboratory:  Reviewed ED lab findings on file  Psychotherapy:  Group sessions  Medications:  See medication lists  Consultations: As needed   Discharge Concerns:  Safety  Estimated LOS: 3-5 days   Other:     I certify that inpatient services furnished can reasonably be expected to improve the patient's condition.   Sanjuana Kava, PMHNP-BC 7/27/20149:02 AM  Patient is seen and evaluated. Case discussed with physician extender and developed treatment plan. Reviewed the information documented and agree with the treatment plan.   Angelle Isais,JANARDHAHA R. 10/25/2012 7:50 PM

## 2012-10-25 NOTE — Progress Notes (Signed)
Patient ID: CEDARIUS KERSH, male   DOB: 1935/04/24, 77 y.o.   MRN: [id number] 10-25-12 nursing shift note: D: pt has been in the bed most of the day. He has not voiced any complaints, nor has he had any complaints of pain. Staff has assisted him to the bathroom and back and forth to his bed, since he is a high fall risk. He has not requested any type of prn. A: staff has made themselves available to the pt throughout the day. He indicated that he like to be independent. R: staff reminded him that our help is for safety reasons. He didn't fill out his inventory sheet. He denied any SI/hi/av. RN will continue to monitor and Q 15 min ck's continue.

## 2012-10-25 NOTE — [facility] Counselor (Signed)
Adult Comprehensive Assessment  Patient ID: [name], male   DOB: 08-29-1935, 77 y.o.   MRN: [id number]  Information Source: Information source: Patient  Current Stressors:  Educational / Learning stressors: N/A Employment / Job issues: on disability Family Relationships: N/A Surveyor, quantity / Lack of resources (include bankruptcy): on fixed income Housing / Lack of housing: N/A Physical health (include injuries & life threatening diseases): heart surgery, prostate cancer in the past Social relationships: N/A Substance abuse: Alcohol abuse Bereavement / Loss: N/A  Living/Environment/Situation:  Living Arrangements: Alone Living conditions (as described by patient or guardian): Pt states that he lives alone in [place].  Pt states that this is a good environment.  How long has patient lived in current situation?: since 1950 What is atmosphere in current home: Supportive;Loving;Comfortable  Family History:  Marital status: Divorced Divorced, when?: married 3 times, lost last wife to bone cancer 6 years ago What types of issues is patient dealing with in the relationship?: none reported Additional relationship information: N/A Does patient have children?: Yes How many children?: 3 How is patient's relationship with their children?: Pt states that he doesn't talk to any of them and blames his ex wife  Childhood History:  By whom was/is the patient raised?: Both parents Additional childhood history information: Pt states that he had a decent childhood.   Description of patient's relationship with caregiver when they were a child: Pt states that he got along well with parents growing up.  Patient's description of current relationship with people who raised him/her: Both parents deceased.  Does patient have siblings?: Yes Number of Siblings: 3 Description of patient's current relationship with siblings: Pt states that he had a twin sister.  pt states brother passed away, older  sister is in a rest home. Twin sister in [place] and is close to her.  Did patient suffer any verbal/emotional/physical/sexual abuse as a child?: No Did patient suffer from severe childhood neglect?: No Has patient ever been sexually abused/assaulted/raped as an adolescent or adult?: No Was the patient ever a victim of a crime or a disaster?: No Witnessed domestic violence?: No Has patient been effected by domestic violence as an adult?: No  Education:  Highest grade of school patient has completed: graduated high school Currently a Consulting civil engineer?: No Learning disability?: No  Employment/Work Situation:   Employment situation: On disability Why is patient on disability: medical issues How long has patient been on disability: 4-5 years Patient's job has been impacted by current illness: No What is the longest time patient has a held a job?: whole life Where was the patient employed at that time?: retail Has patient ever been in the [employer]?: Yes (Describe in comment) Administrator, sports - 4 years) Has patient ever served in Buyer, retail?: No  Financial Resources:   Surveyor, quantity resources: Insurance underwriter Does patient have a Lawyer or guardian?: No  Alcohol/Substance Abuse:   What has been your use of drugs/alcohol within the last 12 months?: Alcohol - pt states that he doesn't know but had whisky and beer in the house at all times and lost track how much.  Pt states that he has drank his whole life, first age of use teenager, heavier when in the [employer] If attempted suicide, did drugs/alcohol play a role in this?: No Alcohol/Substance Abuse Treatment Hx: Denies past history If yes, describe treatment: N/A Has alcohol/substance abuse ever caused legal problems?: Yes (pt reports several DWI's)  Social Support System:   Conservation officer, nature Support System: Fair Museum/gallery exhibitions officer System:  Pt states that everyone passed away but then names his twin sister, niece and nephew as  supportive Type of faith/religion: Methodist How does patient's faith help to cope with current illness?: prayer  Leisure/Recreation:   Leisure and Hobbies: watch TV and drink  Strengths/Needs:   What things does the patient do well?: pt states that he is good at "plotting how to get more to drink". In what areas does patient struggle / problems for patient: Alcohol detox  Discharge Plan:   Does patient have access to transportation?: Yes Will patient be returning to same living situation after discharge?: Yes Currently receiving community mental health services: No If no, would patient like referral for services when discharged?: Yes (What county?) [facility]) Does patient have financial barriers related to discharge medications?: No  Summary/Recommendations:     Patient is a 77 year old Caucasian Male with a diagnosis of Alcohol Dependence and Depressive Disorder.  Patient lives in [place] alone.  Pt states that he came here for alcohol detox.  Pt states that he wants to stop drinking because he is sick.  Patient will benefit from crisis stabilization, medication evaluation, group therapy and psycho education in addition to case management for discharge planning.    Horton, Salome Arnt. 10/25/2012

## 2012-10-25 NOTE — Progress Notes (Signed)
Adult Psychoeducational Group Note  Date:  10/25/2012 Time:  7:16 PM  Group Topic/Focus:  Emotional Education:   The focus of this group is to discuss what feelings/emotions are, and how they are experienced.  Participation Level:  Did Not Attend  [name] 10/25/2012, 7:16 PM

## 2012-10-25 NOTE — [facility] Suicide Risk Assessment (Signed)
Suicide Risk Assessment  Admission Assessment     Nursing information obtained from:  Patient Demographic factors:  Male;Age 77 or older;Divorced or widowed;Caucasian;Unemployed;Access to firearms Current Mental Status:  NA (denies any si/hi/av. pt make a suicidal stmt on adm.) Loss Factors:  Decline in physical health Historical Factors:  Family history of mental illness or substance abuse Risk Reduction Factors:  Sense of responsibility to family;Religious beliefs about death;Positive social support  CLINICAL FACTORS:   Severe Anxiety and/or Agitation Depression:   Anhedonia Comorbid alcohol abuse/dependence Hopelessness Impulsivity Insomnia Recent sense of peace/wellbeing Severe Alcohol/Substance Abuse/Dependencies Previous Psychiatric Diagnoses and Treatments Medical Diagnoses and Treatments/Surgeries  COGNITIVE FEATURES THAT CONTRIBUTE TO RISK:  Closed-mindedness Loss of executive function Polarized thinking    SUICIDE RISK:   Minimal: No identifiable suicidal ideation.  Patients presenting with no risk factors but with morbid ruminations; may be classified as minimal risk based on the severity of the depressive symptoms  PLAN OF CARE: Admit for alcohol detox treatment.  I certify that inpatient services furnished can reasonably be expected to improve the patient's condition.   Zahriah Roes,JANARDHAHA R. 10/25/2012, 12:48 PM

## 2012-10-25 NOTE — Progress Notes (Signed)
[facility] PROGRESS NOTE  JEWELL RYANS OZH:[id number] DOB: February 22, 1936 DOA: 10/24/2012 PCP: Tobin Chad, MD  Assessment/Plan:  1-Atrial fibrillation:  Continue with Metoprolol, HR decrease to 60- 80. Good controlled.  Continue with aspirin. He is at high risk for others anticoagulation due to ongoing alcohol abuse.   2-Hyponatremia/ Hypokalemia:  - Hold hydrochlorothiazide and lisinopril. Until his euvolemic. Repeat Bmet tomorrow to follow sodium level.   3-Pancytopenia:  Likely secondary to alcohol use. Monitor.   4-Alcohol abuse/Alcohol with drawl:  Per Psych. Continue with librium.  5-HYPERTRIGLYCERIDEMIA:  Continue with simvastatin. LFT normal.   6-HYPERTENSION, BENIGN SYSTEMIC  -will hold lisinopril in the HCTZ, patient appears to be dehydrated. Also SBP in the 100 to 120 range.   7-Old LBBB/ Chronic diastolic heart failure  - Start him on metoprolol low dose. We'll hold his ACE. S. he seems to be intravascularly depleted. We'll also hold his hydrochlorothiazide for the same reason.    Procedures:  none  Antibiotics:  None  HPI/Subjective: Feeling well no complaints. No chest pain or dyspnea.   Objective: Filed Vitals:   10/24/12 2006 10/25/12 0700 10/25/12 0701 10/25/12 1200  BP: 130/68 121/77 116/70 123/76  Pulse: 90 85 79 61  Temp:  98 F (36.7 C)    TempSrc:      Resp:  19    Height:      Weight:       No intake or output data in the 24 hours ending 10/25/12 1313 Filed Weights   10/24/12 1045  Weight: 77.111 kg (170 lb)    Exam:   General:  No distress, eating lunch.   Cardiovascular: S 1, S 2 RRR  Respiratory: CTA  Abdomen: BS present, soft, NT.   Musculoskeletal: No edema.   Data Reviewed: Basic Metabolic Panel:  Recent Labs Lab 10/20/12 2031 10/21/12 0420 10/22/12 1930 10/23/12 0034  NA 125* 128* 126* 129*  K 2.5* 2.8* 3.1* 3.5  CL 81* 87* 88* 93*  CO2 31 30 25   --   GLUCOSE 89 76 85 78  BUN 8 7 5* <3*   CREATININE 0.72 0.70 0.69 1.10  CALCIUM 9.0 8.5 8.7  --   MG 1.2*  --   --   --    Liver Function Tests:  Recent Labs Lab 10/22/12 1930  AST 25  ALT 12  ALKPHOS 54  BILITOT 0.9  PROT 6.5  ALBUMIN 3.2*   No results found for this basename: LIPASE, AMYLASE,  in the last 168 hours No results found for this basename: AMMONIA,  in the last 168 hours CBC:  Recent Labs Lab 10/20/12 2031 10/21/12 0420 10/22/12 1930 10/23/12 0034  WBC 4.2 2.5* 3.2*  --   NEUTROABS 2.1  --  1.3*  --   HGB 11.5* 9.6* 10.4* 9.5*  HCT 31.4* 26.8* 29.6* 28.0*  MCV 91.3 91.5 93.1  --   PLT 141* 100* 118*  --    Cardiac Enzymes:  Recent Labs Lab 10/20/12 2037  TROPONINI <0.30   BNP (last 3 results)  Recent Labs  11/12/11 2346 09/13/12 0555  PROBNP 754.1* 1584.0*   CBG: No results found for this basename: GLUCAP,  in the last 168 hours  No results found for this or any previous visit (from the past 240 hour(s)).   Studies: No results found.  Scheduled Meds: . aspirin EC  81 mg Oral Daily  . chlordiazePOXIDE  25 mg Oral TID   Followed by  . [START ON 10/26/2012] chlordiazePOXIDE  25 mg Oral BH-qamhs   Followed by  . [START ON 10/27/2012] chlordiazePOXIDE  25 mg Oral Daily  . folic acid  1 mg Oral Daily  . metoprolol tartrate  12.5 mg Oral BID  . multivitamin with minerals  1 tablet Oral Daily  . nicotine  21 mg Transdermal Q0600  . oxybutynin  5 mg Oral QHS  . simvastatin  20 mg Oral QPM  . thiamine  100 mg Intramuscular Once  . thiamine  100 mg Oral Daily   Continuous Infusions:   Principal Problem:   Atrial fibrillation Active Problems:   HYPERTRIGLYCERIDEMIA   Alcohol abuse   HYPERTENSION, BENIGN SYSTEMIC   LBBB   Chronic diastolic heart failure   Hyponatremia   Hypokalemia   Alcohol withdrawal    Time spent: 25 minutes.     Vamsi Apfel  [facility] Pager [phone]. If 7PM-7AM, please contact night-coverage at [email or web address], password  [facility] 10/25/2012, 1:13 PM  LOS: 1 day

## 2012-10-26 DIAGNOSIS — F1020 Alcohol dependence, uncomplicated: Principal | ICD-10-CM

## 2012-10-26 DIAGNOSIS — F329 Major depressive disorder, single episode, unspecified: Secondary | ICD-10-CM

## 2012-10-26 LAB — BASIC METABOLIC PANEL
BUN: 14 mg/dL (ref 6–23)
Creatinine, Ser: 0.99 mg/dL (ref 0.50–1.35)
GFR calc Af Amer: >90 mL/min (ref >90)
GFR calc non Af Amer: 79 mL/min — ABNORMAL LOW (ref >90)

## 2012-10-26 MED ORDER — POTASSIUM CHLORIDE CRYS ER 20 MEQ PO TBCR
40.0000 meq | EXTENDED_RELEASE_TABLET | Freq: Once | ORAL | Status: AC
  Administered 2012-10-26: 40 meq via ORAL
  Filled 2012-10-26: qty 2

## 2012-10-26 MED ORDER — FERROUS SULFATE 325 (65 FE) MG PO TABS
325.0000 mg | ORAL_TABLET | Freq: Two times a day (BID) | ORAL | Status: DC
  Administered 2012-10-26 – 2012-10-29 (×6): 325 mg via ORAL
  Filled 2012-10-26 (×2): qty 1
  Filled 2012-10-26: qty 8
  Filled 2012-10-26: qty 1
  Filled 2012-10-26: qty 8
  Filled 2012-10-26 (×5): qty 1

## 2012-10-26 MED ORDER — POLYETHYLENE GLYCOL 3350 17 G PO PACK
17.0000 g | PACK | Freq: Every day | ORAL | Status: DC
  Administered 2012-10-26 – 2012-10-29 (×3): 17 g via ORAL
  Filled 2012-10-26 (×5): qty 1
  Filled 2012-10-26: qty 4

## 2012-10-26 MED ORDER — LISINOPRIL 5 MG PO TABS
5.0000 mg | ORAL_TABLET | Freq: Every day | ORAL | Status: DC
  Administered 2012-10-26 – 2012-10-29 (×4): 5 mg via ORAL
  Filled 2012-10-26 (×6): qty 1

## 2012-10-26 MED ORDER — HYDROCHLOROTHIAZIDE 25 MG PO TABS
25.0000 mg | ORAL_TABLET | Freq: Every day | ORAL | Status: DC
  Administered 2012-10-26 – 2012-10-29 (×4): 25 mg via ORAL
  Filled 2012-10-26 (×6): qty 1

## 2012-10-26 NOTE — Progress Notes (Signed)
Psychoeducational Group Note  Date:  10/26/2012 Time:  1100  Group Topic/Focus:  Self Care:   The focus of this group is to help patients understand the importance of self-care in order to improve or restore emotional, physical, spiritual, interpersonal, and financial health.  Participation Level: Did Not Attend  Participation Quality:  Not Applicable  Affect:  Not Applicable  Cognitive:  Not Applicable  Insight:  Not Applicable  Engagement in Group: Not Applicable  Additional Comments:  Pt did not attend and remained resting in group.  Sharyn Lull 10/26/2012, 3:16 PM

## 2012-10-26 NOTE — Progress Notes (Signed)
Psychoeducational Group Note  Date:  10/26/2012 Time:  1000  Group Topic/Focus:  Therapeutic Activity  Participation Level: Did Not Attend  Participation Quality:  Not Applicable  Affect:  Not Applicable  Cognitive:  Not Applicable  Insight:  Not Applicable  Engagement in Group: Not Applicable  Additional Comments:  Patient did not attend group, patient remained in bed.  Karleen Hampshire Brittini 10/26/2012, 11:02 AM

## 2012-10-26 NOTE — [facility] Group Notes (Signed)
[facility] LCSW Group Therapy  10/26/2012 3:43 PM  Type of Therapy:  Group Therapy  Participation Level:  Did Not Attend   Smart, Oleva Koo 10/26/2012, 3:43 PM  

## 2012-10-26 NOTE — Progress Notes (Signed)
Pt has been up and active in the milieu today.  He denies any depression, hopelessness or anxiety.  He denies any S/H ideation or A/V hallucinations.  His gait is unsteady and he stated,"I  Have a cane at home that works better than this thing" which was a walker he would hold the walker up and walk.  Spoke with Aggie NP, and she wrote an order for PT to evaluate and treat.  The PT worked with pt and he now has a cane.  Pt reminded to let staff know if his needs anything.  He did voice understanding.

## 2012-10-26 NOTE — Tx Team (Signed)
Interdisciplinary Treatment Plan Update (Adult)  Date: 10/26/2012   Time Reviewed: 12:29 PM  Progress in Treatment:  Attending groups: Yes Participating in groups:  Yes Taking medication as prescribed: Yes  Tolerating medication: Yes  Family/Significant othe contact made: Not yet. SPE required for this pt.  Patient understands diagnosis: Yes, AEB seeking treatment for ETOH detox, depression, and SI. Discussing patient identified problems/goals with staff: Yes  Medical problems stabilized or resolved: Yes  Denies suicidal/homicidal ideation: Yes, self report.  Patient has not harmed self or Others: Yes  New problem(s) identified: n/a  Discharge Plan or Barriers: Pt sees therapist in [place] but cannot remember her name. He is in need of psychiatrist. Pt plans to return home after d/c.  Additional comments: Patient is a 77 year old white male requesting detox from alcohol. Patient has a BAL of 345. Patient reports that he drinks every day. Patient reports that he is not sure how much he drinks daily. Patient reports that he has been drinking all of his life because his father sold, "moon shine". Patient reports prior treatment at the [facility] in [place] but he is unable to remember the year in which he received services. Patient reports withdrawal symptoms and a history of blackouts. Patient denied seizures. Patient denies using any other drugs. Patients UDS was negative. Patients BAl is 345. Patient reports that he is tired of living like this and wants to receive help. Patient reports depressive episodes. Patient acknowledges stating that he wanted to kill himself with a razor due to feelings of depression and frustration due to his inability to stop drinking. Patient now denies SI. Patient denies HI. Patient denies psychosis. Patient denies prior psychiatric hospitalizations. Patient denies previous mental health treatment for his depression. Reason for Continuation of  Hospitalization: Librium taper-withdrawals Medication management Mood stabilization Estimated length of stay: 2-3 days For review of initial/current patient goals, please see plan of care.  Attendees:  Patient:    Family:    Physician: Geoffery Lyons MD 10/26/2012 12:32 PM   Nursing: Lupita Leash RN 10/26/2012 12:32 PM   Clinical Social Worker Magda Muise Smart, LCSWA  10/26/2012 12:32 PM   Other: Maureen Ralphs RN 10/26/2012 12:32 PM   Other: Aggie PA 10/26/2012 12:32 PM   Other: Darden Dates Nurse CM 10/26/2012 12:32 PM   Other:    Scribe for Treatment Team:  Trula Slade LCSWA 10/26/2012 12:32 PM

## 2012-10-26 NOTE — Progress Notes (Signed)
Patient did not attend the evening speaker AA meeting. Pt remained in bed sleeping.   

## 2012-10-26 NOTE — Evaluation (Addendum)
Physical Therapy Evaluation Patient Details Name: [name] MRN: [id number] DOB: [birth year] Today's Date: 10/26/2012 Time: 4098-1191 PT Time Calculation (min): 30 min  PT Assessment / Plan / Recommendation History of Present Illness    This is a 77 year old Caucasian male. Admitted to [facility] from the [facility] with complaints of increased alcohol consumption and suicidal ideation    Clinical Impression  Pt has generalized weakness with balance and gait impairment.  He will benefit from follow up PT while an inpatient for strengthening and high level gait and balance activities.    PT Assessment  Patient needs continued PT services    Follow Up Recommendations  No PT follow up    Does the patient have the potential to tolerate intense rehabilitation      Barriers to Discharge        Equipment Recommendations       Recommendations for Other Services     Frequency Min 3X/week    Precautions / Restrictions     Pertinent Vitals/Pain No c/o pain      Mobility  Bed Mobility Bed Mobility: Rolling Right;Rolling Left;Supine to Sit;Sit to Supine Rolling Right: 7: Independent Rolling Left: 7: Independent Supine to Sit: 7: Independent Sit to Supine: 7: Independent Transfers Transfers: Sit to Stand;Stand to Sit Sit to Stand: 6: Modified independent (Device/Increase time) Stand to Sit: 6: Modified independent (Device/Increase time) Details for Transfer Assistance: uses arms to push up as he states his legs feel too weak to stand up without this assist Ambulation/Gait Ambulation/Gait Assistance: 6: Modified independent (Device/Increase time) Ambulation Distance (Feet): 200 Feet Assistive device: Straight cane Ambulation/Gait Assistance Details: Pt needs balance assist and does well with single point cane.  He needs consistent verbal cues for posture Gait Pattern: Step-through pattern;Trunk flexed;Wide base of support;Shuffle Gait velocity: pt has difficulty with  changing speeds  General Gait Details: Pt maintains slightly flexed rigid trunk posture, but does have reciprocal arm swing.  He maintains slight flexion at hips and knees. Stairs: No Wheelchair Mobility Wheelchair Mobility: No    Exercises General Exercises - Lower Extremity Ankle Circles/Pumps: AROM;5 reps;Standing;Both Quad Sets: AROM;Both;5 reps;Standing Gluteal Sets: AROM;5 reps;Standing;Both Hip ABduction/ADduction: AROM;Both;5 reps;Sidelying Straight Leg Raises: AROM;Both;5 reps;Supine Hip Flexion/Marching: AROM;Both;5 reps;Supine Heel Raises: AROM;Both;5 reps;Standing Other Exercises Other Exercises: pt given written exercise program including above LE ex and UE rom to activate core   PT Diagnosis: Generalized weakness;Difficulty walking;Abnormality of gait  PT Problem List: Decreased strength;Decreased activity tolerance;Decreased balance;Decreased knowledge of use of DME PT Treatment Interventions: DME instruction;Gait training;Functional mobility training;Therapeutic activities;Therapeutic exercise;Balance training;Patient/family education     PT Goals(Current goals can be found in the care plan section) Acute Rehab PT Goals Patient Stated Goal: to walk with a cane and not fall PT Goal Formulation: With patient Time For Goal Achievement: 11/09/12 Potential to Achieve Goals: Good  Visit Information          Prior Functioning  Home Living Family/patient expects to be discharged to:: Private residence Living Arrangements: Alone Type of Home: House Home Access: Stairs to enter [employer] of Steps: 1 Entrance Stairs-Rails: None Home Layout: One level Home Equipment: Environmental consultant - 2 wheels;Cane - single point;Shower seat;Grab bars - tub/shower Prior Function Level of Independence: Independent with assistive device(s) Communication Communication: No difficulties    Cognition  Cognition Arousal/Alertness: Awake/alert Behavior During Therapy: WFL for tasks  assessed/performed Overall Cognitive Status: Within Functional Limits for tasks assessed    Extremity/Trunk Assessment Lower Extremity Assessment Lower Extremity  Assessment: Generalized weakness;RLE deficits/detail;LLE deficits/detail RLE Deficits / Details: Pt has proximal weakness with decreased hip and knee extension in standing with tendency to forward flex trunk.  He is able to self correct posture to command, but has difficulty maintaining it.  LLE Deficits / Details: Pt has proximal weakness with decreased hip and knee extension in standing with tendency to forward flex trunk.  He is able to self correct posture to command, but has difficulty maintaining it.  Cervical / Trunk Assessment Cervical / Trunk Assessment: Other exceptions Cervical / Trunk Exceptions: generalized muscle atrophy   Balance Balance Balance Assessed: Yes Static Sitting Balance Static Sitting - Balance Support: No upper extremity supported;Feet supported Static Sitting - Level of Assistance: 7: Independent Static Standing Balance Static Standing - Balance Support: No upper extremity supported Static Standing - Level of Assistance: 7: Independent Standardized Balance Assessment Standardized Balance Assessment: Berg Balance Test Berg Balance Test Sit to Stand: Able to stand  independently using hands Standing Unsupported: Able to stand safely 2 minutes Sitting with Back Unsupported but Feet Supported on Floor or Stool: Able to sit safely and securely 2 minutes Stand to Sit: Controls descent by using hands Transfers: Able to transfer safely, definite need of hands Standing Unsupported with Eyes Closed: Able to stand 10 seconds safely Standing Ubsupported with Feet Together: Able to place feet together independently and stand 1 minute safely From Standing, Reach Forward with Outstretched Arm: Can reach forward >5 cm safely (2") From Standing Position, Pick up Object from Floor: Unable to try/needs assist to keep  balance From Standing Position, Turn to Look Behind Over each Shoulder: Turn sideways only but maintains balance Turn 360 Degrees: Able to turn 360 degrees safely one side only in 4 seconds or less Standing Unsupported, Alternately Place Feet on Step/Stool: Needs assistance to keep from falling or unable to try Standing Unsupported, One Foot in Front: Loses balance while stepping or standing (unwilling to try...does not think it would be safe) Standing on One Leg: Unable to try or needs assist to prevent fall Total Score: 32 High Level Balance High Level Balance Activites: Side stepping;Backward walking;Direction changes  End of Session PT - End of Session Activity Tolerance: Patient tolerated treatment well Patient left: in chair Nurse Communication: Mobility status copy of exercise program left in room with chart copy in shadow chart  GP    Rosey Bath K. [place], [place] [phone] 10/26/2012, 5:05 PM

## 2012-10-26 NOTE — Progress Notes (Signed)
[facility] MD Progress Note  10/26/2012 6:07 PM [name]  MRN:  [id number] Subjective:  [name] states he knows he got to drinking too much and that he plans to abstain. He shares the multiple medical problems he has had trough the years. She has survived multiple illnesses. Still feeling weak, starting to have a better appetite.  Diagnosis:  Alcohol Dependence, Major Depression  ADL's:  Intact  Sleep: Fair  Appetite:  Fair  Suicidal Ideation:  Plan:  denies Intent:  denies Means:  denies Homicidal Ideation:  Plan:  denies Intent:  denies Means:  denies AEB (as evidenced by):  Psychiatric Specialty Exam: Review of Systems  Constitutional: Positive for malaise/fatigue.  HENT: Negative.   Eyes: Negative.   Respiratory: Negative.   Cardiovascular: Negative.   Gastrointestinal: Negative.   Genitourinary: Negative.   Skin: Negative.   Neurological: Positive for dizziness and weakness.  Endo/Heme/Allergies: Negative.   Psychiatric/Behavioral: Positive for depression and substance abuse. The patient is nervous/anxious and has insomnia.     Blood pressure 124/71, pulse 95, temperature 97 F (36.1 C), temperature source Oral, resp. rate 18, height 5\' 9"  (1.753 m), weight 77.111 kg (170 lb).Body mass index is 25.09 kg/(m^2).  General Appearance: Disheveled  Eye Solicitor::  Fair  Speech:  Clear and Coherent, Slow and not spontaneous  Volume:  Decreased  Mood:  Anxious, Depressed and worried  Affect:  Restricted  Thought Process:  Coherent and Goal Directed  Orientation:  Full (Time, Place, and Person)  Thought Content:  worries, concerns  Suicidal Thoughts:  No  Homicidal Thoughts:  No  Memory:  Immediate;   Poor Recent;   Poor Remote;   Fair  Judgement:  Fair  Insight:  Present  Psychomotor Activity:  Decreased  Concentration:  Poor  Recall:  Poor  Akathisia:  No  Handed:  Right  AIMS (if indicated):     Assets:  Desire for Improvement Housing  Sleep:  Number of  Hours: 5.75   Current Medications: Current Facility-Administered Medications  Medication Dose Route Frequency Provider Last Rate Last Dose  . acetaminophen (TYLENOL) tablet 650 mg  650 mg Oral Q6H PRN Earney Navy, NP      . alum & mag hydroxide-simeth (MAALOX/MYLANTA) 200-200-20 MG/5ML suspension 30 mL  30 mL Oral Q4H PRN Earney Navy, NP      . aspirin EC tablet 81 mg  81 mg Oral Daily Earney Navy, NP   81 mg at 10/26/12 0854  . chlordiazePOXIDE (LIBRIUM) capsule 25 mg  25 mg Oral Q6H PRN Rachael Fee, MD      . chlordiazePOXIDE (LIBRIUM) capsule 25 mg  25 mg Oral BH-qamhs Rachael Fee, MD   25 mg at 10/26/12 0855   Followed by  . [START ON 10/27/2012] chlordiazePOXIDE (LIBRIUM) capsule 25 mg  25 mg Oral Daily Rachael Fee, MD      . ferrous sulfate tablet 325 mg  325 mg Oral BID WC Sanjuana Kava, NP   325 mg at 10/26/12 1722  . folic acid (FOLVITE) tablet 1 mg  1 mg Oral Daily Earney Navy, NP   1 mg at 10/26/12 0854  . hydrochlorothiazide (HYDRODIURIL) tablet 25 mg  25 mg Oral Daily Belkys A Regalado, MD   25 mg at 10/26/12 1201  . hydrOXYzine (ATARAX/VISTARIL) tablet 25 mg  25 mg Oral Q6H PRN Rachael Fee, MD      . lisinopril (PRINIVIL,ZESTRIL) tablet 5 mg  5 mg  Oral Daily Belkys A Regalado, MD   5 mg at 10/26/12 1201  . loperamide (IMODIUM) capsule 2-4 mg  2-4 mg Oral PRN Rachael Fee, MD      . magnesium hydroxide (MILK OF MAGNESIA) suspension 30 mL  30 mL Oral Daily PRN Earney Navy, NP      . metoprolol tartrate (LOPRESSOR) tablet 12.5 mg  12.5 mg Oral BID Rachael Fee, MD   12.5 mg at 10/26/12 1722  . multivitamin with minerals tablet 1 tablet  1 tablet Oral Daily Marinda Elk, MD   1 tablet at 10/26/12 0855  . nitroGLYCERIN (NITROSTAT) SL tablet 0.4 mg  0.4 mg Sublingual Q5 Min x 3 PRN Earney Navy, NP      . ondansetron (ZOFRAN-ODT) disintegrating tablet 4 mg  4 mg Oral Q6H PRN Rachael Fee, MD      . oxybutynin (DITROPAN) tablet 5  mg  5 mg Oral QHS Earney Navy, NP   5 mg at 10/25/12 2129  . polyethylene glycol (MIRALAX / GLYCOLAX) packet 17 g  17 g Oral Daily Sanjuana Kava, NP   17 g at 10/26/12 1200  . simvastatin (ZOCOR) tablet 20 mg  20 mg Oral QPM Earney Navy, NP   20 mg at 10/26/12 1722  . thiamine (B-1) injection 100 mg  100 mg Intramuscular Once Marinda Elk, MD      . thiamine (VITAMIN B-1) tablet 100 mg  100 mg Oral Daily Marinda Elk, MD   100 mg at 10/26/12 0855  . traMADol (ULTRAM) tablet 50 mg  50 mg Oral Q8H PRN Earney Navy, NP      . zolpidem (AMBIEN) tablet 5 mg  5 mg Oral QHS PRN Earney Navy, NP        Lab Results:  Results for orders placed during the hospital encounter of 10/24/12 (from the past 48 hour(s))  BASIC METABOLIC PANEL     Status: Abnormal   Collection Time    10/26/12  6:20 AM      Result Value Range   Sodium 136  135 - 145 mEq/L   Potassium 3.0 (*) 3.5 - 5.1 mEq/L   Chloride 98  96 - 112 mEq/L   CO2 29  19 - 32 mEq/L   Glucose, Bld 97  70 - 99 mg/dL   BUN 14  6 - 23 mg/dL   Creatinine, Ser 1.61  0.50 - 1.35 mg/dL   Calcium 9.3  8.4 - 09.6 mg/dL   GFR calc non Af Amer 79 (*) >90 mL/min   GFR calc Af Amer >90  >90 mL/min   Comment:            The eGFR has been calculated     using the CKD EPI equation.     This calculation has not been     validated in all clinical     situations.     eGFR's persistently     <90 mL/min signify     possible Chronic Kidney Disease.    Physical Findings: AIMS: Facial and Oral Movements Muscles of Facial Expression: None, normal Lips and Perioral Area: None, normal Jaw: None, normal Tongue: None, normal,Extremity Movements Upper (arms, wrists, hands, fingers): None, normal Lower (legs, knees, ankles, toes): None, normal, Trunk Movements Neck, shoulders, hips: None, normal, Overall Severity Severity of abnormal movements (highest score from questions above): None, normal Incapacitation due to  abnormal movements: None, normal Patient's awareness  of abnormal movements (rate only patient's report): No Awareness, Dental Status Current problems with teeth and/or dentures?: No Does patient usually wear dentures?: Yes (upper and lower )  CIWA:  CIWA-Ar Total: 1 COWS:     Treatment Plan Summary: Daily contact with patient to assess and evaluate symptoms and progress in treatment Medication management  Plan: Supportive approach/coping skills/relapse prevention           Continue the detox            Reassess and address the co morbidities  Medical Decision Making Problem Points:  Review of psycho-social stressors (1) Data Points:  Review of medication regiment & side effects (2)  I certify that inpatient services furnished can reasonably be expected to improve the patient's condition.   [name] A 10/26/2012, 6:07 PM

## 2012-10-26 NOTE — Progress Notes (Signed)
Pt has been in bed all evening. No groups today though pt states he wants to be more independent. "I feel like I could go climb a tree" however pt still slow moving and weak. CIWA is a 2, VSS. No prn's requested or required. Continues to ambulate with walker. No episodes of incontinence this evening. Supported, encouraged. High fall risk precautions in place. Medicated per orders. Denies SI/HI/AVH and remains safe. [name]

## 2012-10-26 NOTE — Progress Notes (Signed)
NUTRITION ASSESSMENT Patient meets criteria for moderate malnutrition related to social/environmental reasons AEB weight loss of 9% in the past 4 months and intake <75% for greater than 1 month.   Pt identified as at risk on the Malnutrition Screen Tool  INTERVENTION: 1. Educated patient on the importance of nutrition and encouraged intake of food and beverages. 2. Discussed weight goals. 3. Supplements: Continue MVI and thiamine daily  NUTRITION DIAGNOSIS: Unintentional weight loss related to sub-optimal intake as evidenced by pt report.   Goal: Pt to meet >/= 90% of their estimated nutrition needs.  Monitor:  PO intake  Assessment:  Patient admitted with ETOH abuse.  Reports good po intake currently but poor prior to admit with poor intake.  "Too lazy to fix anything." Decreased appetite when he drinks.  UBW of 185 lbs approximately 4 months ago.  9% weight loss in the past 4 months.  Patient meets criteria for moderate malnutrition related to social/environmental reasons AEB weight loss of 9% in the past 4 months and intake <75% for greater than 1 month.  77 y.o. male  Height: Ht Readings from Last 1 Encounters:  10/24/12 5\' 9"  (1.753 m)    Weight: Wt Readings from Last 1 Encounters:  10/24/12 170 lb (77.111 kg)    Weight Hx: Wt Readings from Last 10 Encounters:  10/24/12 170 lb (77.111 kg)  10/21/12 172 lb 4.8 oz (78.155 kg)  10/04/12 167 lb 12.3 oz (76.1 kg)  09/25/12 173 lb 4.5 oz (78.6 kg)  09/16/12 178 lb 9.2 oz (81 kg)  05/21/12 182 lb (82.555 kg)  03/12/12 183 lb (83.008 kg)  11/14/11 180 lb 14.4 oz (82.056 kg)  10/08/11 175 lb 14.4 oz (79.788 kg)  09/19/11 181 lb (82.101 kg)    BMI:  Body mass index is 25.09 kg/(m^2). Pt meets criteria for overweight based on current BMI.  Estimated Nutritional Needs: Kcal: 25-30 kcal/kg Protein: > 1 gram protein/kg Fluid: 1 ml/kcal  Diet Order: General Pt is also offered choice of unit snacks mid-morning and  mid-afternoon.  Pt is eating as desired.   Lab results and medications reviewed.   Oran Rein, RD, LDN Clinical Inpatient Dietitian Pager:  [phone] Weekend and after hours pager:  [phone]

## 2012-10-26 NOTE — [facility] Group Notes (Signed)
[facility] LCSW Aftercare Discharge Planning Group Note   10/26/2012 9:40 AM  Participation Quality:  Appropriate   Mood/Affect:  Appropriate  Depression Rating:  3  Anxiety Rating:  3  Thoughts of Suicide:  No Will you contract for safety?   NA  Current AVH:  No  Plan for Discharge/Comments:  Pt reports that he lives in his own home with his cat. He stated that he got drunk and fell recently. He sees a therapist (male) in the [place] area but cannot remember her name at this time. He does not have a psychiatrist but sees PCP for multiple medical problems. He needs referral to psychiatrist and followup with therapist.  Transportation Means: "relatives'  Supports: relatives/family  Smart, Research scientist (physical sciences)

## 2012-10-26 NOTE — Progress Notes (Signed)
[facility] PROGRESS NOTE  [name] ZOX:[id number] DOB: May 05, 1935 DOA: 10/24/2012 PCP: Tobin Chad, MD  Assessment/Plan:  Thank you for the consult. Will sign off. Please call with questions.   1-Atrial fibrillation:  Continue with Metoprolol, HR decrease to 60- 80. Good controlled.  Continue with aspirin. He is at high risk for others anticoagulation due to ongoing alcohol abuse.   2-Hyponatremia/ Hypokalemia: Secondary to potomania and dehydration.  -Resolved.  -Resume hydrochlorothiazide and lisinopril. his euvolemic.   3-Pancytopenia:  Likely secondary to alcohol use. Monitor.   4-Alcohol abuse/Alcohol with drawl:  Per Psych. Continue with librium.  5-HYPERTRIGLYCERIDEMIA:  Continue with simvastatin. LFT normal.   6-HYPERTENSION, BENIGN SYSTEMIC  -will resume lisinopril in the HCTZ.   7-Old LBBB/ Chronic diastolic heart failure  - Continue with  metoprolol low dose.   Procedures:  none  Antibiotics:  None  HPI/Subjective: Feeling well no complaints. No chest pain or dyspnea.   Objective: Filed Vitals:   10/26/12 0730 10/26/12 0731 10/26/12 1130 10/26/12 1159  BP: 145/88 145/88 128/78 130/76  Pulse: 89 92 88 81  Temp: 98 F (36.7 C)  97.7 F (36.5 C)   TempSrc: Oral  Oral   Resp: 20     Height:      Weight:       No intake or output data in the 24 hours ending 10/26/12 1201 Filed Weights   10/24/12 1045  Weight: 77.111 kg (170 lb)    Exam:   General:  No distress, eating lunch.   Cardiovascular: S 1, S 2 RRR  Respiratory: CTA  Abdomen: BS present, soft, NT.   Musculoskeletal: No edema.   Data Reviewed: Basic Metabolic Panel:  Recent Labs Lab 10/20/12 2031 10/21/12 0420 10/22/12 1930 10/23/12 0034 10/26/12 0620  NA 125* 128* 126* 129* 136  K 2.5* 2.8* 3.1* 3.5 3.0*  CL 81* 87* 88* 93* 98  CO2 31 30 25   --  29  GLUCOSE 89 76 85 78 97  BUN 8 7 5* <3* 14  CREATININE 0.72 0.70 0.69 1.10 0.99  CALCIUM 9.0 8.5  8.7  --  9.3  MG 1.2*  --   --   --   --    Liver Function Tests:  Recent Labs Lab 10/22/12 1930  AST 25  ALT 12  ALKPHOS 54  BILITOT 0.9  PROT 6.5  ALBUMIN 3.2*   No results found for this basename: LIPASE, AMYLASE,  in the last 168 hours No results found for this basename: AMMONIA,  in the last 168 hours CBC:  Recent Labs Lab 10/20/12 2031 10/21/12 0420 10/22/12 1930 10/23/12 0034  WBC 4.2 2.5* 3.2*  --   NEUTROABS 2.1  --  1.3*  --   HGB 11.5* 9.6* 10.4* 9.5*  HCT 31.4* 26.8* 29.6* 28.0*  MCV 91.3 91.5 93.1  --   PLT 141* 100* 118*  --    Cardiac Enzymes:  Recent Labs Lab 10/20/12 2037  TROPONINI <0.30   BNP (last 3 results)  Recent Labs  11/12/11 2346 09/13/12 0555  PROBNP 754.1* 1584.0*   CBG: No results found for this basename: GLUCAP,  in the last 168 hours  No results found for this or any previous visit (from the past 240 hour(s)).   Studies: No results found.  Scheduled Meds: . aspirin EC  81 mg Oral Daily  . chlordiazePOXIDE  25 mg Oral BH-qamhs   Followed by  . [START ON 10/27/2012] chlordiazePOXIDE  25 mg Oral Daily  .  ferrous sulfate  325 mg Oral BID WC  . folic acid  1 mg Oral Daily  . hydrochlorothiazide  25 mg Oral Daily  . lisinopril  5 mg Oral Daily  . metoprolol tartrate  12.5 mg Oral BID  . multivitamin with minerals  1 tablet Oral Daily  . oxybutynin  5 mg Oral QHS  . polyethylene glycol  17 g Oral Daily  . potassium chloride  40 mEq Oral Once  . simvastatin  20 mg Oral QPM  . thiamine  100 mg Intramuscular Once  . thiamine  100 mg Oral Daily   Continuous Infusions:   Principal Problem:   Alcohol dependence Active Problems:   HYPERTRIGLYCERIDEMIA   Alcohol abuse   HYPERTENSION, BENIGN SYSTEMIC   LBBB   Atrial fibrillation   Chronic diastolic heart failure   Hyponatremia   Hypokalemia   Alcohol withdrawal    Time spent: 25 minutes.     [name]  [facility] Pager [phone]. If 7PM-7AM,  please contact night-coverage at [email or web address], password [facility] 10/26/2012, 12:01 PM  LOS: 2 days

## 2012-10-27 MED ORDER — CHLORDIAZEPOXIDE HCL 25 MG PO CAPS
25.0000 mg | ORAL_CAPSULE | Freq: Once | ORAL | Status: AC
  Administered 2012-10-27: 25 mg via ORAL

## 2012-10-27 MED ORDER — CHLORDIAZEPOXIDE HCL 25 MG PO CAPS
ORAL_CAPSULE | ORAL | Status: AC
  Filled 2012-10-27: qty 1

## 2012-10-27 NOTE — [facility] Group Notes (Signed)
[facility] LCSW Group Therapy  10/27/2012 3:06 PM  Type of Therapy:  Group Therapy  Participation Level:  Active  Participation Quality:  Attentive  Affect:  Appropriate  Cognitive:  Alert  Insight:  Improving  Engagement in Therapy:  Improving  Modes of Intervention:  Discussion, Education, Exploration, Socialization and Support  Summary of Progress/Problems: MHA Speaker came to talk about his personal journey with substance abuse and addiction. The pt processed ways by which to relate to the speaker. MHA speaker provided handouts and educational information pertaining to groups and services offered by the [facility]. [place] was engaged and attentive throughout group. He sat quietly as the speaker talked about his personal struggle with MI and SA. Charvis did not ask questions but made a comment that "to change, you must WANT to change. Want is the key word." Darrly thanked the speaker for his time after group ended.    Smart, Tamyrah Burbage 10/27/2012, 3:06 PM

## 2012-10-27 NOTE — Progress Notes (Signed)
Recreation Therapy Notes  Date: 07.29.2014 Time: 2:30pm Location: [address] Dayroom  Group Topic: Animal Assisted Activities  Goal Area(s) Addresses:  Patient will interact appropriately with dog team.    Behavioral Response: Did not attend  [employer], LRT/CTRS  Jaceion Aday L 10/27/2012 4:32 PM

## 2012-10-27 NOTE — Progress Notes (Signed)
Patient ID: [name], male   DOB: 15-Oct-1935, 77 y.o.   MRN: [id number] [facility] MD Progress Note  10/27/2012 2:29 PM KERRIGAN GLENDENING  MRN:  [id number]  Subjective:  [name] states that he is 77 and an alcoholic. He says he knows everything that is being taught in group meetings because he has lived long enough to experience them first hand. He adds, because old habit is just hard to break, that is why he still drinks. He says he is not depressed just a little anxious. He complains of cough, non-productive. Says it keeps him up at night. Denies any SIHI.  Diagnosis:  Alcohol Dependence, Major Depression  ADL's:  Intact  Sleep: Fair  Appetite:  Fair  Suicidal Ideation:  Plan:  denies Intent:  denies Means:  denies Homicidal Ideation:  Plan:  denies Intent:  denies Means:  denies AEB (as evidenced by):  Psychiatric Specialty Exam: Review of Systems  Constitutional: Positive for malaise/fatigue.  HENT: Negative.   Eyes: Negative.   Respiratory: Negative.   Cardiovascular: Negative.   Gastrointestinal: Negative.   Genitourinary: Negative.   Skin: Negative.   Neurological: Positive for dizziness and weakness.  Endo/Heme/Allergies: Negative.   Psychiatric/Behavioral: Positive for depression and substance abuse. The patient is nervous/anxious and has insomnia.     Blood pressure 128/73, pulse 83, temperature 98.7 F (37.1 C), temperature source Oral, resp. rate 18, height 5\' 9"  (1.753 m), weight 77.111 kg (170 lb).Body mass index is 25.09 kg/(m^2).  General Appearance: Disheveled,  Eye Contact::  Fair  Speech:  Clear and Coherent, Slow and not spontaneous  Volume:  Decreased  Mood:  Anxious and worried  Affect:  Restricted  Thought Process:  Coherent and Goal Directed  Orientation:  Full (Time, Place, and Person)  Thought Content:  worries, concerns  Suicidal Thoughts:  No  Homicidal Thoughts:  No  Memory:  Immediate;   Poor Recent;   Poor Remote;   Fair  Judgement:   Fair  Insight:  Present  Psychomotor Activity:  Decreased  Concentration:  Poor  Recall:  Poor  Akathisia:  No  Handed:  Right  AIMS (if indicated):     Assets:  Desire for Improvement Housing  Sleep:  Number of Hours: 5.75   Current Medications: Current Facility-Administered Medications  Medication Dose Route Frequency Provider Last Rate Last Dose  . acetaminophen (TYLENOL) tablet 650 mg  650 mg Oral Q6H PRN Earney Navy, NP      . alum & mag hydroxide-simeth (MAALOX/MYLANTA) 200-200-20 MG/5ML suspension 30 mL  30 mL Oral Q4H PRN Earney Navy, NP      . aspirin EC tablet 81 mg  81 mg Oral Daily Earney Navy, NP   81 mg at 10/27/12 0810  . ferrous sulfate tablet 325 mg  325 mg Oral BID WC Sanjuana Kava, NP   325 mg at 10/27/12 0811  . folic acid (FOLVITE) tablet 1 mg  1 mg Oral Daily Earney Navy, NP   1 mg at 10/27/12 0811  . hydrochlorothiazide (HYDRODIURIL) tablet 25 mg  25 mg Oral Daily Belkys A Regalado, MD   25 mg at 10/27/12 0811  . lisinopril (PRINIVIL,ZESTRIL) tablet 5 mg  5 mg Oral Daily Belkys A Regalado, MD   5 mg at 10/27/12 0811  . magnesium hydroxide (MILK OF MAGNESIA) suspension 30 mL  30 mL Oral Daily PRN Earney Navy, NP      . metoprolol tartrate (LOPRESSOR) tablet 12.5 mg  12.5 mg Oral BID Rachael Fee, MD   12.5 mg at 10/27/12 1610  . multivitamin with minerals tablet 1 tablet  1 tablet Oral Daily Marinda Elk, MD   1 tablet at 10/27/12 [phone]  . nitroGLYCERIN (NITROSTAT) SL tablet 0.4 mg  0.4 mg Sublingual Q5 Min x 3 PRN Earney Navy, NP      . oxybutynin (DITROPAN) tablet 5 mg  5 mg Oral QHS Earney Navy, NP   5 mg at 10/26/12 2141  . polyethylene glycol (MIRALAX / GLYCOLAX) packet 17 g  17 g Oral Daily Sanjuana Kava, NP   17 g at 10/27/12 0810  . simvastatin (ZOCOR) tablet 20 mg  20 mg Oral QPM Earney Navy, NP   20 mg at 10/26/12 1722  . thiamine (B-1) injection 100 mg  100 mg Intramuscular Once Marinda Elk, MD      . thiamine (VITAMIN B-1) tablet 100 mg  100 mg Oral Daily Marinda Elk, MD   100 mg at 10/27/12 0811  . traMADol (ULTRAM) tablet 50 mg  50 mg Oral Q8H PRN Earney Navy, NP      . zolpidem (AMBIEN) tablet 5 mg  5 mg Oral QHS PRN Earney Navy, NP        Lab Results:  Results for orders placed during the hospital encounter of 10/24/12 (from the past 48 hour(s))  BASIC METABOLIC PANEL     Status: Abnormal   Collection Time    10/26/12  6:20 AM      Result Value Range   Sodium 136  135 - 145 mEq/L   Potassium 3.0 (*) 3.5 - 5.1 mEq/L   Chloride 98  96 - 112 mEq/L   CO2 29  19 - 32 mEq/L   Glucose, Bld 97  70 - 99 mg/dL   BUN 14  6 - 23 mg/dL   Creatinine, Ser 5.40  0.50 - 1.35 mg/dL   Calcium 9.3  8.4 - 98.1 mg/dL   GFR calc non Af Amer 79 (*) >90 mL/min   GFR calc Af Amer >90  >90 mL/min   Comment:            The eGFR has been calculated     using the CKD EPI equation.     This calculation has not been     validated in all clinical     situations.     eGFR's persistently     <90 mL/min signify     possible Chronic Kidney Disease.    Physical Findings: AIMS: Facial and Oral Movements Muscles of Facial Expression: None, normal Lips and Perioral Area: None, normal Jaw: None, normal Tongue: None, normal,Extremity Movements Upper (arms, wrists, hands, fingers): None, normal Lower (legs, knees, ankles, toes): None, normal, Trunk Movements Neck, shoulders, hips: None, normal, Overall Severity Severity of abnormal movements (highest score from questions above): None, normal Incapacitation due to abnormal movements: None, normal Patient's awareness of abnormal movements (rate only patient's report): No Awareness, Dental Status Current problems with teeth and/or dentures?: No Does patient usually wear dentures?: Yes (upper and lower )  CIWA:  CIWA-Ar Total: 0 COWS:     Treatment Plan Summary: Daily contact with patient to assess and evaluate  symptoms and progress in treatment Medication management  Plan: Supportive approach/coping skills/relapse prevention. Robitussin cough solution for frequent coughing. Continue the detoxification treatment to combat alcohol withdrawal symptoms. Reassess and address the co morbidities. Discharge plan in progress.  Continue current treatment plan.  Medical Decision Making Problem Points:  Review of psycho-social stressors (1) Data Points:  Review of medication regiment & side effects (2)  I certify that inpatient services furnished can reasonably be expected to improve the patient's condition.   Armandina Stammer I, PMHNP-BC 10/27/2012, 2:29 PM

## 2012-10-27 NOTE — Progress Notes (Signed)
D: Patient denies SI/HI/AVH. Patient rates hopelessness as 1,  depression as 1, and anxiety as 9.  Patient affect and mood are anxious.  Pt states, "I am worried about my bills getting paid on time.  I get my check on the first and pay my bills, by phone, on the 3rd.  I don't know how I'm going to get the number."  Patient did NOT attend evening group. Patient visible on the milieu. A: Support and encouragement offered. I gave pt contact numbers for [employer] Hatch and [employer] for his bill pay.  Scheduled medications given to pt. Q 15 min checks continued for patient safety. R: Patient receptive. Pt stated that his anxiety had been relieved with the receipt of the contact information.  Patient remains safe on the unit.

## 2012-10-27 NOTE — Progress Notes (Signed)
The focus of this group is to educate the patient on the purpose and policies of crisis stabilization and provide a format to answer questions about their admission.  The group details unit policies and expectations of patients while admitted. ° °Pt did not attend. °

## 2012-10-27 NOTE — Progress Notes (Signed)
Recreation Therapy Notes  Date: 07.29.2014 Time: 3:00pm Location: [address] Dayroom  Group Topic: Communication, Team Building, Problem Solving  Goal Area(s) Addresses:  Patient will be able to recognize use of communication, team building and problem solving during course of group activity. Patient will verbalize need for communication, team building and problem solving to make group activity successful.  Patient will verbalize benefit of communication, team building and problem solving to post d/c goals.   Behavioral Response: Did not attend   Jearl Klinefelter, LRT/CTRS  Jearl Klinefelter 10/27/2012 4:11 PM

## 2012-10-27 NOTE — Progress Notes (Signed)
Adult Psychoeducational Group Note  Date:  10/27/2012 Time:  3:10 PM  Group Topic/Focus:  Recovery Goals:   The focus of this group is to identify appropriate goals for recovery and establish a plan to achieve them.  Participation Level:  Minimal  Participation Quality:  Appropriate and Attentive  Affect:  Depressed  Cognitive:  Alert and Appropriate  Insight: Good  Engagement in Group:  Engaged  Modes of Intervention:  Discussion, Socialization and Support  Additional Comments:  Pt came to group and stated that his drinking and lack of social support is standing between him and recovery. Pt plans on changing this by joining a church program and making more friends and reaching back out to his family.   Cathlean Cower 10/27/2012, 3:10 PM

## 2012-10-28 ENCOUNTER — Encounter: Payer: Self-pay | Admitting: Internal Medicine

## 2012-10-28 NOTE — Clinical Social Work Note (Signed)
Referral for CST sent to [facility]. Called to check status today at 12:05PM. Voicemail left for [name].

## 2012-10-28 NOTE — Progress Notes (Signed)
Pt attended NA meeting this evening.  

## 2012-10-28 NOTE — Progress Notes (Signed)
Patient ID: [name], male   DOB: 12-Apr-1935, 77 y.o.   MRN: [id number] Pt did not attend group.

## 2012-10-28 NOTE — Progress Notes (Signed)
Patient ID: [name], male   DOB: 05-28-1935, 77 y.o.   MRN: [id number]  D: Patient lying bed since undersigned first came in tonight. Tech reports up x1 to the bathroomthen back to bed. Did not attend 8pm group tonight. Patient had to be woken up for his HS med. Patient reports feeling ok at present. Had earlier c/o cravings and given a one time dose of Librium. Drowsy at this time. A: Staff will monitor on q 15 minute checks, follow treatment plan, and give meds as ordered. R: Took med and went back to bed.

## 2012-10-28 NOTE — [facility] Group Notes (Signed)
[facility] LCSW Aftercare Discharge Planning Group Note   10/28/2012 9:26 AM  Participation Quality:  Appropriate   Mood/Affect:  Appropriate  Depression Rating:  3-4  Anxiety Rating:  3-4  Thoughts of Suicide:  No Will you contract for safety?   NA  Current AVH:  No  Plan for Discharge/Comments:  Pt informed that CSW called [facility] and made referral for CST services. Pt plans to return home after d/c, where he lives alone.   Transportation Means: unknown at this time.   Supports: some family  Smart, Research scientist (physical sciences)

## 2012-10-28 NOTE — Progress Notes (Signed)
D:  Per pt self inventory pt reports sleeping well, appetite good, energy level low, ability to pay attention good, rates depression at a 4 out of 10 and hopelessness at a 5 out of 10, denies SI/HI/AVH, c/o cravings at times, pt states that he is "feeling wonderful" today, denies pain.     A:  MD notified, Emotional support provided, Encouraged pt to continue with treatment plan and attend all group activities, q15 min checks maintained for safety.  R:  Pt is not attending any groups, pt has been pleasant and cooperative otherwise.

## 2012-10-28 NOTE — Progress Notes (Signed)
Patient ID: ORIE BAXENDALE, male   DOB: 1936-02-18, 77 y.o.   MRN: [id number] Patient ID: QUINTO TIPPY, male   DOB: 1935/10/19, 77 y.o.   MRN: [id number] [facility] MD Progress Note  10/28/2012 3:45 PM PURCELL JUNGBLUTH  MRN:  [id number]  Subjective:  Mr. Sheckler reports that he is doing fairly well today. Says he feels he is getting stable, rather can't seem to fight off the cravings for alcohol. Says he does not need any excuse to drink at all, rather find a way to find away to fight the urge coming from an old habit.  He adds that the 3 girl-friends he has are all drunks, may try to find another woman that does not like alcohol. He continues to worry about relapse.  Diagnosis:  Alcohol Dependence, Major Depression  ADL's:  Intact  Sleep: Fair  Appetite:  Fair  Suicidal Ideation:  Plan:  denies Intent:  denies Means:  denies Homicidal Ideation:  Plan:  denies Intent:  denies Means:  denies AEB (as evidenced by):  Psychiatric Specialty Exam: Review of Systems  Constitutional: Positive for malaise/fatigue.  HENT: Negative.   Eyes: Negative.   Respiratory: Negative.   Cardiovascular: Negative.   Gastrointestinal: Negative.   Genitourinary: Negative.   Skin: Negative.   Neurological: Positive for dizziness and weakness.  Endo/Heme/Allergies: Negative.   Psychiatric/Behavioral: Positive for depression and substance abuse. The patient is nervous/anxious and has insomnia.     Blood pressure 112/64, pulse 74, temperature 98.6 F (37 C), temperature source Oral, resp. rate 14, height 5\' 9"  (1.753 m), weight 77.111 kg (170 lb).Body mass index is 25.09 kg/(m^2).  General Appearance: Disheveled,  Eye Contact::  Fair  Speech:  Clear and Coherent, Slow and not spontaneous  Volume:  Decreased  Mood:  Anxious and worried  Affect:  Restricted  Thought Process:  Coherent and Goal Directed  Orientation:  Full (Time, Place, and Person)  Thought Content:  worries, concerns  Suicidal Thoughts:  No   Homicidal Thoughts:  No  Memory:  Immediate;   Poor Recent;   Poor Remote;   Fair  Judgement:  Fair  Insight:  Present  Psychomotor Activity:  Decreased  Concentration:  Poor  Recall:  Poor  Akathisia:  No  Handed:  Right  AIMS (if indicated):     Assets:  Desire for Improvement Housing  Sleep:  Number of Hours: 6.5   Current Medications: Current Facility-Administered Medications  Medication Dose Route Frequency Provider Last Rate Last Dose  . acetaminophen (TYLENOL) tablet 650 mg  650 mg Oral Q6H PRN Earney Navy, NP      . alum & mag hydroxide-simeth (MAALOX/MYLANTA) 200-200-20 MG/5ML suspension 30 mL  30 mL Oral Q4H PRN Earney Navy, NP      . aspirin EC tablet 81 mg  81 mg Oral Daily Earney Navy, NP   81 mg at 10/28/12 0817  . ferrous sulfate tablet 325 mg  325 mg Oral BID WC Sanjuana Kava, NP   325 mg at 10/28/12 0817  . folic acid (FOLVITE) tablet 1 mg  1 mg Oral Daily Earney Navy, NP   1 mg at 10/28/12 0817  . hydrochlorothiazide (HYDRODIURIL) tablet 25 mg  25 mg Oral Daily Belkys A Regalado, MD   25 mg at 10/28/12 0817  . lisinopril (PRINIVIL,ZESTRIL) tablet 5 mg  5 mg Oral Daily Belkys A Regalado, MD   5 mg at 10/28/12 0817  . magnesium hydroxide (MILK OF MAGNESIA)  suspension 30 mL  30 mL Oral Daily PRN Earney Navy, NP      . metoprolol tartrate (LOPRESSOR) tablet 12.5 mg  12.5 mg Oral BID Rachael Fee, MD   12.5 mg at 10/28/12 9147  . multivitamin with minerals tablet 1 tablet  1 tablet Oral Daily Marinda Elk, MD   1 tablet at 10/28/12 0817  . nitroGLYCERIN (NITROSTAT) SL tablet 0.4 mg  0.4 mg Sublingual Q5 Min x 3 PRN Earney Navy, NP      . oxybutynin (DITROPAN) tablet 5 mg  5 mg Oral QHS Earney Navy, NP   5 mg at 10/27/12 2137  . polyethylene glycol (MIRALAX / GLYCOLAX) packet 17 g  17 g Oral Daily Sanjuana Kava, NP   17 g at 10/27/12 0810  . simvastatin (ZOCOR) tablet 20 mg  20 mg Oral QPM Earney Navy, NP    20 mg at 10/27/12 1722  . thiamine (B-1) injection 100 mg  100 mg Intramuscular Once Marinda Elk, MD      . thiamine (VITAMIN B-1) tablet 100 mg  100 mg Oral Daily Marinda Elk, MD   100 mg at 10/28/12 0817  . traMADol (ULTRAM) tablet 50 mg  50 mg Oral Q8H PRN Earney Navy, NP      . zolpidem (AMBIEN) tablet 5 mg  5 mg Oral QHS PRN Earney Navy, NP        Lab Results:  No results found for this or any previous visit (from the past 48 hour(s)).  Physical Findings: AIMS: Facial and Oral Movements Muscles of Facial Expression: None, normal Lips and Perioral Area: None, normal Jaw: None, normal Tongue: None, normal,Extremity Movements Upper (arms, wrists, hands, fingers): None, normal Lower (legs, knees, ankles, toes): None, normal, Trunk Movements Neck, shoulders, hips: None, normal, Overall Severity Severity of abnormal movements (highest score from questions above): None, normal Incapacitation due to abnormal movements: None, normal Patient's awareness of abnormal movements (rate only patient's report): No Awareness, Dental Status Current problems with teeth and/or dentures?: No Does patient usually wear dentures?: Yes (upper and lower )  CIWA:  CIWA-Ar Total: 0 COWS:     Treatment Plan Summary: Daily contact with patient to assess and evaluate symptoms and progress in treatment Medication management  Plan: Supportive approach/coping skills/relapse prevention. Encouraged out of room, participation in group sessions and application of coping skills when distressed. Will continue to monitor response to/adverse effects of medications in use to assure effectiveness. Continue to monitor mood, behavior and interaction with staff and other patients. Continue current plan of care.  Medical Decision Making Problem Points:  Review of psycho-social stressors (1) Data Points:  Review of medication regiment & side effects (2)  I certify that inpatient services  furnished can reasonably be expected to improve the patient's condition.   Armandina Stammer I, PMHNP-BC 10/28/2012, 3:45 PM

## 2012-10-28 NOTE — Progress Notes (Signed)
Patient ID: [name], male   DOB: 08-Dec-1935, 77 y.o.   MRN: [id number] D: Patient stated he is "doing well". Pt mood is depressed with flat affect. Pt denies SI/HI/AVH and pain. Pt denies s/s of withdrawals. Pt has tremors in hands, slow and a high fall risk.  Pt attended evening NA group and engaged in discussion. Pt denies any needs or concerns. Cooperative with assessment. No acute distressed noted at this time.   A: Met with pt 1:1. Medications administered as prescribed. Writer encouraged pt to discuss feelings. Pt encouraged to come to staff with any question or concerns. 15 minutes checks for safety.   R: Patient remains safe. He is complaint with medications and denies any adverse reaction. Continue current POC.

## 2012-10-28 NOTE — [facility] Group Notes (Signed)
[facility] LCSW Group Therapy  10/28/2012 4:22 PM  Type of Therapy:  Group Therapy  Participation Level:  Did Not Attend  Smart, Lunell Robart 10/28/2012, 4:22 PM  

## 2012-10-29 MED ORDER — TRAMADOL HCL 50 MG PO TABS: 50.0000 mg | ORAL_TABLET | Freq: Three times a day (TID) | ORAL | Status: DC | PRN

## 2012-10-29 MED ORDER — POLYETHYLENE GLYCOL 3350 17 G PO PACK: 17.0000 g | PACK | Freq: Every day | ORAL | Status: DC

## 2012-10-29 MED ORDER — METOPROLOL TARTRATE 12.5 MG HALF TABLET: 12.5000 mg | ORAL_TABLET | Freq: Two times a day (BID) | ORAL | Status: DC

## 2012-10-29 MED ORDER — ASPIRIN EC 81 MG PO TBEC: 81.0000 mg | DELAYED_RELEASE_TABLET | Freq: Every day | ORAL | Status: DC

## 2012-10-29 MED ORDER — OXYBUTYNIN CHLORIDE 5 MG PO TABS: 5.0000 mg | ORAL_TABLET | Freq: Every day | ORAL | Status: DC

## 2012-10-29 MED ORDER — FISH OIL 1200 MG PO CAPS: 1.0000 | ORAL_CAPSULE | Freq: Every day | ORAL | Status: DC

## 2012-10-29 MED ORDER — HYDROCHLOROTHIAZIDE 25 MG PO TABS: 25.0000 mg | ORAL_TABLET | Freq: Every day | ORAL | Status: DC

## 2012-10-29 MED ORDER — LISINOPRIL 5 MG PO TABS: 5.0000 mg | ORAL_TABLET | Freq: Every day | ORAL | Status: DC

## 2012-10-29 MED ORDER — SIMVASTATIN 20 MG PO TABS: 20.0000 mg | ORAL_TABLET | Freq: Every evening | ORAL | Status: DC

## 2012-10-29 MED ORDER — ADULT MULTIVITAMIN W/MINERALS CH: 1.0000 | ORAL_TABLET | Freq: Every day | ORAL | Status: DC

## 2012-10-29 MED ORDER — FERROUS SULFATE 325 (65 FE) MG PO TABS: 325.0000 mg | ORAL_TABLET | Freq: Two times a day (BID) | ORAL | Status: DC

## 2012-10-29 MED ORDER — NITROGLYCERIN 0.4 MG SL SUBL: 0.4000 mg | SUBLINGUAL_TABLET | SUBLINGUAL | Status: DC | PRN

## 2012-10-29 MED ORDER — ZOLPIDEM TARTRATE 5 MG PO TABS: 5.0000 mg | ORAL_TABLET | Freq: Every evening | ORAL | Status: DC | PRN

## 2012-10-29 NOTE — Progress Notes (Signed)
Psychoeducational Group Note  Date:  10/29/2012 Time: 08:45  Group Topic/Focus:  Orientation:   The focus of this group is to educate the patient on the purpose and policies of crisis stabilization and provide a format to answer questions about their admission.  The group details unit policies and expectations of patients while admitted.  Participation Level: Did Not Attend  Participation Quality:  Not Applicable  Affect:  Not Applicable  Cognitive:  Not Applicable  Insight:  Not Applicable  Engagement in Group: Not Applicable  Additional Comments:  Did not attend.  Oliva Bustard 10/29/2012, 3:28 PM

## 2012-10-29 NOTE — Progress Notes (Signed)
Patient ID: [name], male   DOB: 09/04/1935, 77 y.o.   MRN: [id number] Patient discharged home per MD order.  Patient received all personal belongings, discharge instructions, prescriptions and medication samples.  He denies SI/HI/AVH.  He left ambulatory with his friend.  He will follow up with [facility].

## 2012-10-29 NOTE — Progress Notes (Signed)
[facility] Adult Case Management Discharge Plan :  Will you be returning to the same living situation after discharge: Yes,  home At discharge, do you have transportation home?: Yes. niece Do you have the ability to pay for your medications:Yes,  medicare/[employer]  Release of information consent forms completed and in the chart;  Patient's signature needed at discharge.  Patient to Follow up at: Follow-up Information   Follow up with [facility]. (Delores will call you to schedule time for visit/assessment. If you do not get a phone call by Monday, call Delores at [phone].)    Contact information:   [address] [place], [place] [address] phone: [phone] fax: [phone]      Patient denies SI/HI:   Yes,  in group/self report    Safety Planning and Suicide Prevention discussed:  Yes,  contact attempts with pt's neice. Voicemail left. SPE completed with pt and he was encouraged to ask questions/voice concerns relating to SPE.  Smart, Corvette Orser 10/29/2012, 10:32 AM

## 2012-10-29 NOTE — [facility] Suicide Risk Assessment (Signed)
[facility] INPATIENT:  Family/Significant Other Suicide Prevention Education  Suicide Prevention Education:  Contact Attempts: [name] (niece) has been identified by the patient as the family member/significant other with whom the patient will be residing, and identified as the person(s) who will aid the patient in the event of a mental health crisis.  With written consent from the patient, two attempts were made to provide suicide prevention education, prior to and/or following the patient's discharge.  We were unsuccessful in providing suicide prevention education.  A suicide education pamphlet was given to the patient to share with family/significant other.  Date and time of first attempt: 10/28/12 1:00PM Date and time of second attempt:10/29/12 10:30AM VM left  [name], [name] 10/29/2012, 10:28 AM

## 2012-10-29 NOTE — Discharge Summary (Signed)
Physician Discharge Summary Note  Patient:  [name] is an 77 y.o., male MRN:  [id number] DOB:  Sep 23, 1935 Patient phone:  [phone] (home)  Patient address:   [address] [place] [address],   Date of Admission:  10/24/2012 Date of Discharge: 10/29/12  Reason for Admission:  Alcohol detox  Discharge Diagnoses: Principal Problem:   Alcohol dependence Active Problems:   HYPERTRIGLYCERIDEMIA   Alcohol abuse   HYPERTENSION, BENIGN SYSTEMIC   LBBB   Atrial fibrillation   Chronic diastolic heart failure   Hyponatremia   Hypokalemia   Alcohol withdrawal  Review of Systems  Constitutional: Negative.   HENT: Negative.   Eyes: Negative.   Respiratory: Negative.   Cardiovascular: Negative.   Gastrointestinal: Negative.   Genitourinary: Negative.   Musculoskeletal: Negative.   Skin: Negative.   Neurological: Negative.   Endo/Heme/Allergies: Negative.   Psychiatric/Behavioral: Positive for substance abuse. Negative for depression, suicidal ideas (Alcoholism), hallucinations and memory loss. The patient is nervous/anxious (Stabilized with medication prior to discharge) and has insomnia (Stabilized with medication prior to discharge).    Axis Diagnosis:   AXIS I:  Alcohol dependence AXIS II:  Deferred AXIS III:   Past Medical History  Diagnosis Date  . Myocardial infarction 1985.1997  . Pancreatitis, alcoholic   . Osteoarthritis of spine 03/2005    thoracic and lumbar by x-ray  . Prostate carcinoma 06/2004    Gleason score 6  . Cerebral atrophy 10/2005    head CT  . Syncope 10/2005    vs seizure  . Bradycardia, sinus 07/2007    temporary pacing, alcohol intox  . Atrial fibrillation with rapid ventricular response 04/2009    new onset, alcoholism  . Coronary artery disease   . Hypertension   . Anemia    AXIS IV:  other psychosocial or environmental problems, problems with primary support group and Chronic health issues, Alcoholism AXIS V:  63  Level of  Care:  OP  Hospital Course:  This is a 77 year old Caucasian male. Admitted to [facility] from the [facility] with complaints of increased alcohol consumption and suicidal ideation with plans to cut himself with a razor. Patient reports, "I don't remember much of anything to tell you the truth. I know I drink a lot of alcohol. I cannot tell you the amount of alcohol that I drink. I really don't go by that at all. I drink as much as I can, have done so all my life. I drink everyday. I live by myself. As a result, I drink whenever I want to and when I can. I have other health problems, and I'm on disability because of all my health problems. I don't normally go through the withdrawal symptoms because I don't let alcohol run low in my system. This time around, I drank so much that I became afraid that I might fall. So I called 911. I need help. I cannot continue like I have doing. I'm not depressed. Rather I have some anxiousness going on".  Upon admission into this hospital, and after admission assessment/evaluation coupled with UDS/Toxicology reports, it was determined that [name] will need detoxification treatment protocol to stabilize his system of alcohol intoxication and to combat the withdrawal symptoms of this substance as well.  He was then started on Librium detoxification treatment protocol. He was also enrolled in group counseling sessions and activities where he was taught, counseled and learned coping skills that should help him after discharge to cope better and  manage his substance abuse issues to sustain a much longer sobriety. He also attended AA/NA meetings being offered and held on this unit. [name] has some previously existing and or identifiable medical conditions that required treatment and or monitoring. He received medication management for all those health issues as well, including primary care consult for his chronic health condition. He was monitored closely for any potential  problems that may arise as a result of and or during detoxification treatment. Patient tolerated his treatment regimen and detoxification treatment protocol without any significant adverse effects and or reactions presented. He was provided with walker to aid his ambulation and balance through out his hospital stay. Physical therapy also evaluated his poor balance situation. .  Patient attended treatment team meeting this am and met with the treatment team members. His reason for admission, present symptoms, substance abuse issues, response to treatment and discharge plans discussed. Patient endorsed that he is doing well and stable for discharge to pursue the next phase of his substance abuse treatment. It was then agreed upon that he will follow-up care with the [facility] here in [place], [place]. He is instructed that he will receive a call from [facility] to schedule time for the initial assessment. The address, date, time and contact information for the [facility] provided for patient in writing. Besides the treatments received, patient also received Ambien 5 mg Q bedtime for sleep. He denies being and or feeling depressed/anxious throughout his hospital stay.    Upon discharge, patient adamantly denies suicidal, homicidal ideations, auditory, visual hallucinations, delusional thougts and or withdrawal symptoms. Patient left [facility] with all personal belongings in no apparent distress. He received 4 days worth supply samples of his discharge medications provided by [facility] pharmacy. Transportation per family (Niece).   Consults:  psychiatry  Significant Diagnostic Studies:  labs: CBC with diff, CMP, UDS, Toxicology tests, U/A  Discharge Vitals:   Blood pressure 129/74, pulse 80, temperature 97.6 F (36.4 C), temperature source Oral, resp. rate 17, height 5\' 9"  (1.753 m), weight 77.111 kg (170 lb). Body mass index is 25.09 kg/(m^2). Lab Results:   No results found for this or any previous visit (from the past 72  hour(s)).  Physical Findings: AIMS: Facial and Oral Movements Muscles of Facial Expression: None, normal Lips and Perioral Area: None, normal Jaw: None, normal Tongue: None, normal,Extremity Movements Upper (arms, wrists, hands, fingers): None, normal Lower (legs, knees, ankles, toes): None, normal, Trunk Movements Neck, shoulders, hips: None, normal, Overall Severity Severity of abnormal movements (highest score from questions above): None, normal Incapacitation due to abnormal movements: None, normal Patient's awareness of abnormal movements (rate only patient's report): No Awareness, Dental Status Current problems with teeth and/or dentures?: No Does patient usually wear dentures?: Yes (upper and lower )  CIWA:  CIWA-Ar Total: 6 COWS:     Psychiatric Specialty Exam: See Psychiatric Specialty Exam and Suicide Risk Assessment completed by Attending Physician prior to discharge.  Discharge destination:  Home  Is patient on multiple antipsychotic therapies at discharge:  No   Has Patient had three or more failed trials of antipsychotic monotherapy by history:  No  Recommended Plan for Multiple Antipsychotic Therapies: NA     Medication List    STOP taking these medications       LORazepam 1 MG tablet  Commonly known as:  ATIVAN      TAKE these medications     Indication   aspirin EC 81 MG tablet  Take 1 tablet (81  mg total) by mouth daily. For heart health   Indication:  Heart health     ferrous sulfate 325 (65 FE) MG tablet  Take 1 tablet (325 mg total) by mouth 2 (two) times daily with a meal. (May be purchased from over the counter at your local pharmacy: For low iron   Indication:  Iron Deficiency     Fish Oil 1200 MG Caps  Take 1 capsule (1,200 mg total) by mouth daily. For high cholesterol   Indication:  High Amount of Cholesterol in the Blood     hydrochlorothiazide 25 MG tablet  Commonly known as:  HYDRODIURIL  Take 1 tablet (25 mg total) by mouth daily.  For hypertension   Indication:  High Blood Pressure     lisinopril 5 MG tablet  Commonly known as:  PRINIVIL,ZESTRIL  Take 1 tablet (5 mg total) by mouth daily. For hypertension   Indication:  High Blood Pressure     metoprolol tartrate 12.5 mg Tabs  Commonly known as:  LOPRESSOR  Take 0.5 tablets (12.5 mg total) by mouth 2 (two) times daily. For hypertension   Indication:  High Blood Pressure     multivitamin with minerals Tabs  Take 1 tablet by mouth daily. For low vitamin   Indication:  Vitamin supplement     nitroGLYCERIN 0.4 MG SL tablet  Commonly known as:  NITROSTAT  Place 1 tablet (0.4 mg total) under the tongue every 5 (five) minutes x 3 doses as needed. For chest pain.   Indication:  Acute Angina Pectoris     oxybutynin 5 MG tablet  Commonly known as:  DITROPAN  Take 1 tablet (5 mg total) by mouth at bedtime. For bladder spasm causing frequent urination   Indication:  Frequent Urination, Urinary Incontinence, Urinary Urgency     polyethylene glycol packet  Commonly known as:  MIRALAX / GLYCOLAX  Take 17 g by mouth daily. For constipation   Indication:  Constipation     simvastatin 20 MG tablet  Commonly known as:  ZOCOR  Take 1 tablet (20 mg total) by mouth every evening. For high cholesterol   Indication:  Inherited Heterozygous Hypercholesterolemia, Inherited Homozygous Hypercholesterolemia     traMADol 50 MG tablet  Commonly known as:  ULTRAM  Take 1 tablet (50 mg total) by mouth every 8 (eight) hours as needed. For pain   Indication:  Moderate to Moderately Severe Pain     zolpidem 5 MG tablet  Commonly known as:  AMBIEN  Take 1 tablet (5 mg total) by mouth at bedtime as needed for sleep.   Indication:  Trouble Sleeping       Follow-up Information   Follow up with [facility]. (Delores will call you to schedule time for visit/assessment. If you do not get a phone call by Monday, call Delores at [phone].)    Contact information:   [address]  [place], [place] [address] phone: [phone] fax: [phone]     Follow-up recommendations: Activity:  As tolerated Diet: As recommended by your primary care doctor. Keep all scheduled follow-up appointments as recommended.  Continue to work the relapse prevention plan Comments: Take all your medications as prescribed by your mental healthcare provider. Report any adverse effects and or reactions from your medicines to your outpatient provider promptly. Patient is instructed and cautioned to not engage in alcohol and or illegal drug use while on prescription medicines. In the event of worsening symptoms, patient is instructed to call the crisis hotline, 911 and  or go to the nearest ED for appropriate evaluation and treatment of symptoms. Follow-up with your primary care provider for your other medical issues, concerns and or health care needs.   Total Discharge Time:  Greater than 30 minutes.  Signed: Sanjuana Kava, PMHNP-BC 10/30/2012, 3:56 PM Agree with assessment and plan Madie Reno A. Dub Mikes, M.D.

## 2012-10-29 NOTE — Progress Notes (Signed)
Recreation Therapy Notes  Date: 07.31.2014 Time: 3:00pm Location: [address] Dayroom  Group Topic: Leisure Education  Goal Area(s) Addresses:  Patient will verbalize activity of interest by end of group session. Patient will verbalize the ability to use positive leisure/recreation as a coping mechanism.  Behavioral Response: Did not attend  Tomer Chalmers L Kyuss Hale, LRT/CTRS  Christhoper Busbee L 10/29/2012 4:30 PM 

## 2012-10-29 NOTE — [facility] Group Notes (Signed)
[facility] LCSW Group Therapy  10/29/2012 4:40 PM  Type of Therapy:  Group Therapy  Participation Level:  Did Not Attend  Smart, [name] 10/29/2012, 4:40 PM

## 2012-10-29 NOTE — Progress Notes (Signed)
Physical Therapy Treatment Patient Details Name: [name] MRN: [id number] DOB: 08/14/1935 Today's Date: 10/29/2012 Time: 4098-1191 PT Time Calculation (min): 25 min  PT Assessment / Plan / Recommendation  History of Present Illness     PT Comments   Pt reports he has a straight cane at home he will use  Follow Up Recommendations        Does the patient have the potential to tolerate intense rehabilitation     Barriers to Discharge        Equipment Recommendations       Recommendations for Other Services    Frequency     Progress towards PT Goals Progress towards PT goals: Progressing toward goals  Plan Current plan remains appropriate    Precautions / Restrictions     Pertinent Vitals/Pain No c/o pain    Mobility  Transfers Transfers: Sit to Stand;Stand to Sit Sit to Stand: 7: Independent Stand to Sit: 7: Independent Details for Transfer Assistance: no loss of balance noted Ambulation/Gait Ambulation/Gait Assistance: 6: Modified independent (Device/Increase time) Ambulation Distance (Feet): 200 Feet Assistive device: Straight cane Ambulation/Gait Assistance Details: verbal cues to stand up straight  Gait velocity: pt has difficulty with changing speeds, but was able to attempt alteration in speed several times  General Gait Details: Pt walking in hallway with straight cane upon my arrival  He is anxious to go home this afternoon. He visibly relaxed when social worker came to tell him his neice will be coming in about an hour and he was able to participate with PT activities    Exercises General Exercises - Lower Extremity Ankle Circles/Pumps: AROM;5 reps;Standing;Both Gluteal Sets: AROM;5 reps;Standing;Both Hip ABduction/ADduction: AROM;Both;5 reps;Standing Hip Flexion/Marching: AROM;Both;5 reps;Seated;Standing (pt standing with back against the wall) Heel Raises: AROM;Both;5 reps;Standing Mini-Sqauts: AROM;Standing (holding onto railing) Other  Exercises Other Exercises: standing with back against the wall to stretch back extension. Other Exercises: reviewed written home program   PT Diagnosis:    PT Problem List:   PT Treatment Interventions:     PT Goals (current goals can now be found in the care plan section)    Visit Information       Subjective Data      Cognition  Cognition Arousal/Alertness: Awake/alert Behavior During Therapy: WFL for tasks assessed/performed Overall Cognitive Status: Within Functional Limits for tasks assessed    Balance     End of Session PT - End of Session Activity Tolerance: Patient tolerated treatment well Patient left: in chair Nurse Communication: Mobility status   GP    Bayard Hugger. [place], [place] [phone] 10/29/2012, 4:19 PM

## 2012-10-29 NOTE — Clinical Social Work Note (Signed)
CSW spoke with pt's niece, Arline Asp. She plans to pick up [facility] around 4-4:30PM today.

## 2012-10-29 NOTE — [facility] Suicide Risk Assessment (Signed)
Suicide Risk Assessment  Discharge Assessment     Demographic Factors:  Male, Age 77 or older and Living alone  Mental Status Per Nursing Assessment::   On Admission:  NA (denies any si/hi/av. pt make a suicidal stmt on adm.)  Current Mental Status by Physician: In full contact with reality. There are no suicidal ideas, plans or intent. His mood is euthymic, his affect is appropriate. He feels ready to go home. He is committed to stop drinking. States there is nothing left at the house and he does not plan to get any more. He states that he has people, family who come by and check on him.    Loss Factors: Decline in physical health  Historical Factors: NA  Risk Reduction Factors:   Positive social support  Continued Clinical Symptoms:  Depression:   Comorbid alcohol abuse/dependence Alcohol/Substance Abuse/Dependencies  Cognitive Features That Contribute To Risk: none identified    Suicide Risk:  Minimal: No identifiable suicidal ideation.  Patients presenting with no risk factors but with morbid ruminations; may be classified as minimal risk based on the severity of the depressive symptoms  Discharge Diagnoses:   AXIS I:  Alcohol Dependence, substance induced mood disorder AXIS II:  Deferred AXIS III:   Past Medical History  Diagnosis Date  . Myocardial infarction 1985.1997  . Pancreatitis, alcoholic   . Osteoarthritis of spine 03/2005    thoracic and lumbar by x-ray  . Prostate carcinoma 06/2004    Gleason score 6  . Cerebral atrophy 10/2005    head CT  . Syncope 10/2005    vs seizure  . Bradycardia, sinus 07/2007    temporary pacing, alcohol intox  . Atrial fibrillation with rapid ventricular response 04/2009    new onset, alcoholism  . Coronary artery disease   . Hypertension   . Anemia    AXIS IV:  decline health AXIS V:  61-70 mild symptoms  Plan Of Care/Follow-up recommendations:  Activity:  as tolerated Diet:  regular Follow up outpatient basis Is  patient on multiple antipsychotic therapies at discharge:  No   Has Patient had three or more failed trials of antipsychotic monotherapy by history:  No  Recommended Plan for Multiple Antipsychotic Therapies: N/A   Vivianne Carles A 10/29/2012, 10:16 AM

## 2012-11-02 ENCOUNTER — Emergency Department (HOSPITAL_COMMUNITY)
Admission: EM | Admit: 2012-11-02 | Discharge: 2012-11-02 | Disposition: A | Discharge status: Home / Self Care | Payer: Medicare Other | Attending: Emergency Medicine | Admitting: Emergency Medicine

## 2012-11-02 ENCOUNTER — Encounter (HOSPITAL_COMMUNITY): Payer: Self-pay

## 2012-11-02 ENCOUNTER — Emergency Department (HOSPITAL_COMMUNITY): Payer: Medicare Other

## 2012-11-02 DIAGNOSIS — W010XXA Fall on same level from slipping, tripping and stumbling without subsequent striking against object, initial encounter: Secondary | ICD-10-CM | POA: Insufficient documentation

## 2012-11-02 DIAGNOSIS — Z7982 Long term (current) use of aspirin: Secondary | ICD-10-CM | POA: Insufficient documentation

## 2012-11-02 DIAGNOSIS — I10 Essential (primary) hypertension: Secondary | ICD-10-CM | POA: Insufficient documentation

## 2012-11-02 DIAGNOSIS — S61412A Laceration without foreign body of left hand, initial encounter: Secondary | ICD-10-CM

## 2012-11-02 DIAGNOSIS — Z862 Personal history of diseases of the blood and blood-forming organs and certain disorders involving the immune mechanism: Secondary | ICD-10-CM | POA: Insufficient documentation

## 2012-11-02 DIAGNOSIS — Z951 Presence of aortocoronary bypass graft: Secondary | ICD-10-CM | POA: Insufficient documentation

## 2012-11-02 DIAGNOSIS — I2510 Atherosclerotic heart disease of native coronary artery without angina pectoris: Secondary | ICD-10-CM | POA: Insufficient documentation

## 2012-11-02 DIAGNOSIS — Z87891 Personal history of nicotine dependence: Secondary | ICD-10-CM | POA: Insufficient documentation

## 2012-11-02 DIAGNOSIS — Z79899 Other long term (current) drug therapy: Secondary | ICD-10-CM | POA: Insufficient documentation

## 2012-11-02 DIAGNOSIS — Y9301 Activity, walking, marching and hiking: Secondary | ICD-10-CM | POA: Insufficient documentation

## 2012-11-02 DIAGNOSIS — S0990XA Unspecified injury of head, initial encounter: Secondary | ICD-10-CM | POA: Insufficient documentation

## 2012-11-02 DIAGNOSIS — M47814 Spondylosis without myelopathy or radiculopathy, thoracic region: Secondary | ICD-10-CM | POA: Insufficient documentation

## 2012-11-02 DIAGNOSIS — F10220 Alcohol dependence with intoxication, uncomplicated: Secondary | ICD-10-CM

## 2012-11-02 DIAGNOSIS — Z8669 Personal history of other diseases of the nervous system and sense organs: Secondary | ICD-10-CM | POA: Insufficient documentation

## 2012-11-02 DIAGNOSIS — Y92009 Unspecified place in unspecified non-institutional (private) residence as the place of occurrence of the external cause: Secondary | ICD-10-CM | POA: Insufficient documentation

## 2012-11-02 DIAGNOSIS — I4891 Unspecified atrial fibrillation: Secondary | ICD-10-CM | POA: Insufficient documentation

## 2012-11-02 DIAGNOSIS — Z9889 Other specified postprocedural states: Secondary | ICD-10-CM | POA: Insufficient documentation

## 2012-11-02 DIAGNOSIS — I252 Old myocardial infarction: Secondary | ICD-10-CM | POA: Insufficient documentation

## 2012-11-02 DIAGNOSIS — Z8546 Personal history of malignant neoplasm of prostate: Secondary | ICD-10-CM | POA: Insufficient documentation

## 2012-11-02 DIAGNOSIS — Z8679 Personal history of other diseases of the circulatory system: Secondary | ICD-10-CM | POA: Insufficient documentation

## 2012-11-02 DIAGNOSIS — M47817 Spondylosis without myelopathy or radiculopathy, lumbosacral region: Secondary | ICD-10-CM | POA: Insufficient documentation

## 2012-11-02 DIAGNOSIS — S61409A Unspecified open wound of unspecified hand, initial encounter: Secondary | ICD-10-CM | POA: Insufficient documentation

## 2012-11-02 DIAGNOSIS — Z8719 Personal history of other diseases of the digestive system: Secondary | ICD-10-CM | POA: Insufficient documentation

## 2012-11-02 DIAGNOSIS — F10229 Alcohol dependence with intoxication, unspecified: Secondary | ICD-10-CM | POA: Insufficient documentation

## 2012-11-02 LAB — CBC
Hemoglobin: 9.9 g/dL — ABNORMAL LOW (ref 13.0–17.0)
MCH: 33.3 pg (ref 26.0–34.0)
MCHC: 35.4 g/dL (ref 30.0–36.0)

## 2012-11-02 LAB — BASIC METABOLIC PANEL
Calcium: 9.4 mg/dL (ref 8.4–10.5)
GFR calc non Af Amer: 68 mL/min — ABNORMAL LOW (ref >90)
Glucose, Bld: 85 mg/dL (ref 70–99)
Sodium: 127 mEq/L — ABNORMAL LOW (ref 135–145)

## 2012-11-02 LAB — POCT I-STAT TROPONIN I

## 2012-11-02 MED ORDER — IBUPROFEN 200 MG PO TABS
600.0000 mg | ORAL_TABLET | Freq: Once | ORAL | Status: AC
  Administered 2012-11-02: 600 mg via ORAL
  Filled 2012-11-02: qty 3
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
## 2012-11-02 NOTE — ED Notes (Signed)
Per [facility], pt intoxicated and states he fell last night onto carpeting. Denies any pain. VSS.

## 2012-11-02 NOTE — ED Notes (Signed)
Dr. Patria Mane in to suture hand

## 2012-11-02 NOTE — ED Notes (Signed)
Patient transported to X-ray 

## 2012-11-02 NOTE — ED Notes (Signed)
Phlebotomy at the bedside. Wound to left hand cleansed.

## 2012-11-02 NOTE — ED Notes (Signed)
Dr. Campos at the bedside.  

## 2012-11-02 NOTE — Progress Notes (Signed)
Patient Discharge Instructions:  After Visit Summary (AVS):   Faxed to:  11/02/12 Discharge Summary Note:   Faxed to:  11/02/12 Psychiatric Admission Assessment Note:   Faxed to:  11/02/12 Suicide Risk Assessment - Discharge Assessment:   Faxed to:  11/02/12 Faxed/Sent to the Next Level Care provider:  11/02/12 Faxed to [facility] @ [phone] Jerelene Redden, 11/02/2012, 4:08 PM

## 2012-11-02 NOTE — ED Provider Notes (Signed)
MSE was initiated and I personally evaluated the patient and placed orders (if any) at  2:40 PM on November 02, 2012.  77 year old male with an extensive past medical history including alcohol abuse, afib and MI presents emergency department via EMS after falling today while under the influence of alcohol. States he drinks about 1 gallon of vodka today. She was also drinking alcohol last night, sustained another fall, unsure if he hit his head. Today he fell onto his left shoulder which is causing him "very mild pain". He is also complaining of chest pain beginning while drinking alcohol last night, described as an uncomfortable feeling worse when he swallows. He does have a history of MI, symptoms probably not cardiac, however due to this history will rule out. On physical exam, patient is alert and oriented x3 in no apparent distress. Heart regular rate and rhythm. Lungs clear. Pupils PERRLA, EOMI. Superficial skin tear on left hand, full range of motion. No shoulder deformity. Labs pending, CBC, BMP, troponin. EKG, chest x-ray, CT head, x-ray of left shoulder. Wound care for superficial laceration on left hand.  The patient appears stable so that the remainder of the MSE may be completed by another provider.  Trevor Mace, PA-C 11/02/12 1444

## 2012-11-05 NOTE — ED Provider Notes (Signed)
Medical screening examination/treatment/procedure(s) were performed by non-physician practitioner and as supervising physician I was immediately available for consultation/collaboration.  Candyce Churn, MD 11/05/12 [phone]

## 2012-11-23 ENCOUNTER — Other Ambulatory Visit: Payer: Self-pay | Admitting: Family Medicine

## 2012-12-23 ENCOUNTER — Encounter: Payer: Self-pay | Admitting: Family Medicine

## 2012-12-23 NOTE — Progress Notes (Signed)
Patient ID: [name], male   DOB: 07-06-1935, 77 y.o.   MRN: [id number] Please call patient to set up appointment for follow of back pain and refill of tramadol.

## 2012-12-25 ENCOUNTER — Other Ambulatory Visit: Payer: Self-pay | Admitting: Family Medicine

## 2012-12-25 ENCOUNTER — Ambulatory Visit: Payer: Self-pay | Admitting: Family Medicine

## 2012-12-25 MED ORDER — TRAMADOL HCL 50 MG PO TABS: 50.0000 mg | ORAL_TABLET | Freq: Four times a day (QID) | ORAL | Status: DC | PRN

## 2012-12-29 ENCOUNTER — Ambulatory Visit: Payer: Self-pay | Admitting: Family Medicine

## 2012-12-29 NOTE — Progress Notes (Signed)
Patient had an appointment this morning and no showed.[name], [name]

## 2013-01-25 ENCOUNTER — Other Ambulatory Visit: Payer: Self-pay | Admitting: Family Medicine

## 2013-02-21 ENCOUNTER — Emergency Department (HOSPITAL_COMMUNITY): Payer: Medicare Other

## 2013-02-21 ENCOUNTER — Encounter (HOSPITAL_COMMUNITY): Payer: Self-pay | Admitting: Emergency Medicine

## 2013-02-21 ENCOUNTER — Inpatient Hospital Stay (HOSPITAL_COMMUNITY)
Admission: EM | Admit: 2013-02-21 | Discharge: 2013-02-24 | DRG: 439 | Disposition: A | Discharge status: Home / Self Care | Payer: Medicare Other | Attending: Family Medicine | Admitting: Family Medicine

## 2013-02-21 DIAGNOSIS — Z951 Presence of aortocoronary bypass graft: Secondary | ICD-10-CM

## 2013-02-21 DIAGNOSIS — Z87891 Personal history of nicotine dependence: Secondary | ICD-10-CM

## 2013-02-21 DIAGNOSIS — K859 Acute pancreatitis without necrosis or infection, unspecified: Principal | ICD-10-CM

## 2013-02-21 DIAGNOSIS — Z7982 Long term (current) use of aspirin: Secondary | ICD-10-CM

## 2013-02-21 DIAGNOSIS — N138 Other obstructive and reflux uropathy: Secondary | ICD-10-CM

## 2013-02-21 DIAGNOSIS — I2510 Atherosclerotic heart disease of native coronary artery without angina pectoris: Secondary | ICD-10-CM

## 2013-02-21 DIAGNOSIS — N401 Enlarged prostate with lower urinary tract symptoms: Secondary | ICD-10-CM

## 2013-02-21 DIAGNOSIS — D6959 Other secondary thrombocytopenia: Secondary | ICD-10-CM | POA: Diagnosis present

## 2013-02-21 DIAGNOSIS — K852 Alcohol induced acute pancreatitis without necrosis or infection: Secondary | ICD-10-CM

## 2013-02-21 DIAGNOSIS — R32 Unspecified urinary incontinence: Secondary | ICD-10-CM | POA: Diagnosis present

## 2013-02-21 DIAGNOSIS — I10 Essential (primary) hypertension: Secondary | ICD-10-CM

## 2013-02-21 DIAGNOSIS — F1010 Alcohol abuse, uncomplicated: Secondary | ICD-10-CM

## 2013-02-21 DIAGNOSIS — E871 Hypo-osmolality and hyponatremia: Secondary | ICD-10-CM

## 2013-02-21 DIAGNOSIS — Z8546 Personal history of malignant neoplasm of prostate: Secondary | ICD-10-CM

## 2013-02-21 DIAGNOSIS — R1012 Left upper quadrant pain: Secondary | ICD-10-CM | POA: Diagnosis present

## 2013-02-21 DIAGNOSIS — R1013 Epigastric pain: Secondary | ICD-10-CM | POA: Diagnosis present

## 2013-02-21 DIAGNOSIS — F1020 Alcohol dependence, uncomplicated: Secondary | ICD-10-CM

## 2013-02-21 DIAGNOSIS — D638 Anemia in other chronic diseases classified elsewhere: Secondary | ICD-10-CM

## 2013-02-21 DIAGNOSIS — I252 Old myocardial infarction: Secondary | ICD-10-CM

## 2013-02-21 DIAGNOSIS — E876 Hypokalemia: Secondary | ICD-10-CM

## 2013-02-21 DIAGNOSIS — R7989 Other specified abnormal findings of blood chemistry: Secondary | ICD-10-CM | POA: Diagnosis present

## 2013-02-21 DIAGNOSIS — R112 Nausea with vomiting, unspecified: Secondary | ICD-10-CM | POA: Diagnosis present

## 2013-02-21 DIAGNOSIS — Z9849 Cataract extraction status, unspecified eye: Secondary | ICD-10-CM

## 2013-02-21 DIAGNOSIS — N390 Urinary tract infection, site not specified: Secondary | ICD-10-CM

## 2013-02-21 DIAGNOSIS — I4891 Unspecified atrial fibrillation: Secondary | ICD-10-CM

## 2013-02-21 DIAGNOSIS — B961 Klebsiella pneumoniae [K. pneumoniae] as the cause of diseases classified elsewhere: Secondary | ICD-10-CM | POA: Diagnosis present

## 2013-02-21 HISTORY — DX: Anemia in other chronic diseases classified elsewhere: D63.8

## 2013-02-21 LAB — BASIC METABOLIC PANEL WITH GFR
BUN: 9 mg/dL (ref 6–23)
CO2: 25 meq/L (ref 19–32)
Calcium: 8.1 mg/dL — ABNORMAL LOW (ref 8.4–10.5)
Chloride: 89 meq/L — ABNORMAL LOW (ref 96–112)
Creatinine, Ser: 0.76 mg/dL (ref 0.50–1.35)
GFR calc Af Amer: >90 mL/min
GFR calc non Af Amer: 88 mL/min — ABNORMAL LOW
Glucose, Bld: 100 mg/dL — ABNORMAL HIGH (ref 70–99)
Potassium: 3.1 meq/L — ABNORMAL LOW (ref 3.5–5.1)
Sodium: 125 meq/L — ABNORMAL LOW (ref 135–145)

## 2013-02-21 LAB — URINALYSIS, ROUTINE W REFLEX MICROSCOPIC
Ketones, ur: 15 mg/dL — AB
Nitrite: POSITIVE — AB
Specific Gravity, Urine: 1.038 — ABNORMAL HIGH (ref 1.005–1.030)
pH: 7 (ref 5.0–8.0)

## 2013-02-21 LAB — CBC WITH DIFFERENTIAL/PLATELET
Eosinophils Absolute: 0 10*3/uL (ref 0.0–0.7)
Hemoglobin: 10.8 g/dL — ABNORMAL LOW (ref 13.0–17.0)
Lymphocytes Relative: 13 % (ref 12–46)
Lymphs Abs: 1 10*3/uL (ref 0.7–4.0)
Neutro Abs: 6.1 10*3/uL (ref 1.7–7.7)
Neutrophils Relative %: 81 % — ABNORMAL HIGH (ref 43–77)
Platelets: 169 10*3/uL (ref 150–400)
RBC: 3.28 MIL/uL — ABNORMAL LOW (ref 4.22–5.81)
WBC: 7.5 10*3/uL (ref 4.0–10.5)

## 2013-02-21 LAB — COMPREHENSIVE METABOLIC PANEL
ALT: 84 U/L — ABNORMAL HIGH (ref 0–53)
AST: 77 U/L — ABNORMAL HIGH (ref 0–37)
AST: 141 U/L — ABNORMAL HIGH (ref 0–37)
Albumin: 3.4 g/dL — ABNORMAL LOW (ref 3.5–5.2)
Albumin: 2.5 g/dL — ABNORMAL LOW (ref 3.5–5.2)
BUN: 11 mg/dL (ref 6–23)
CO2: 27 mEq/L (ref 19–32)
Calcium: 8.2 mg/dL — ABNORMAL LOW (ref 8.4–10.5)
Chloride: 82 mEq/L — ABNORMAL LOW (ref 96–112)
Chloride: 90 mEq/L — ABNORMAL LOW (ref 96–112)
Creatinine, Ser: 0.8 mg/dL (ref 0.50–1.35)
Creatinine, Ser: 0.85 mg/dL (ref 0.50–1.35)
GFR calc non Af Amer: 84 mL/min — ABNORMAL LOW (ref >90)
Sodium: 123 mEq/L — ABNORMAL LOW (ref 135–145)
Sodium: 126 mEq/L — ABNORMAL LOW (ref 135–145)
Total Bilirubin: 2.4 mg/dL — ABNORMAL HIGH (ref 0.3–1.2)

## 2013-02-21 LAB — CBC
HCT: 26.2 % — ABNORMAL LOW (ref 39.0–52.0)
MCH: 33.7 pg (ref 26.0–34.0)
MCHC: 36.3 g/dL — ABNORMAL HIGH (ref 30.0–36.0)
Platelets: 109 10*3/uL — ABNORMAL LOW (ref 150–400)
RDW: 13.8 % (ref 11.5–15.5)

## 2013-02-21 LAB — URINE MICROSCOPIC-ADD ON

## 2013-02-21 LAB — LIPID PANEL
Cholesterol: 104 mg/dL (ref 0–200)
HDL: 64 mg/dL
LDL Cholesterol: 26 mg/dL (ref 0–99)
Total CHOL/HDL Ratio: 1.6 ratio
Triglycerides: 68 mg/dL
VLDL: 14 mg/dL (ref 0–40)

## 2013-02-21 LAB — MAGNESIUM
Magnesium: 1.2 mg/dL — ABNORMAL LOW (ref 1.5–2.5)
Magnesium: 1.4 mg/dL — ABNORMAL LOW (ref 1.5–2.5)

## 2013-02-21 LAB — ETHANOL: Alcohol, Ethyl (B): <11 mg/dL (ref 0–11)

## 2013-02-21 LAB — TROPONIN I: Troponin I: <0.3 ng/mL (ref <0.30)

## 2013-02-21 MED ORDER — ONDANSETRON HCL 4 MG/2ML IJ SOLN
4.0000 mg | Freq: Once | INTRAMUSCULAR | Status: AC
  Administered 2013-02-21: 4 mg via INTRAVENOUS
  Filled 2013-02-21: qty 2

## 2013-02-21 MED ORDER — HYDROMORPHONE HCL PF 1 MG/ML IJ SOLN: 1.0000 mg | INTRAMUSCULAR | Status: AC | PRN

## 2013-02-21 MED ORDER — SENNOSIDES-DOCUSATE SODIUM 8.6-50 MG PO TABS: 1.0000 | ORAL_TABLET | Freq: Every evening | ORAL | Status: DC | PRN

## 2013-02-21 MED ORDER — ONDANSETRON HCL 4 MG PO TABS: 4.0000 mg | ORAL_TABLET | Freq: Four times a day (QID) | ORAL | Status: DC | PRN

## 2013-02-21 MED ORDER — SODIUM CHLORIDE 0.9 % IV SOLN
INTRAVENOUS | Status: DC
  Administered 2013-02-22 (×3): via INTRAVENOUS

## 2013-02-21 MED ORDER — MAGNESIUM SULFATE 40 MG/ML IJ SOLN
2.0000 g | Freq: Once | INTRAMUSCULAR | Status: AC
  Administered 2013-02-21: 2 g via INTRAVENOUS
  Filled 2013-02-21: qty 50

## 2013-02-21 MED ORDER — MAGNESIUM SULFATE 40 MG/ML IJ SOLN
2.0000 g | Freq: Once | INTRAMUSCULAR | Status: DC
  Filled 2013-02-21: qty 50

## 2013-02-21 MED ORDER — LACTATED RINGERS IV BOLUS (SEPSIS)
1000.0000 mL | Freq: Once | INTRAVENOUS | Status: AC
  Administered 2013-02-21: 1000 mL via INTRAVENOUS

## 2013-02-21 MED ORDER — SODIUM CHLORIDE 0.9 % IV SOLN
INTRAVENOUS | Status: AC
  Administered 2013-02-21: 11:00:00 via INTRAVENOUS

## 2013-02-21 MED ORDER — POTASSIUM CHLORIDE 10 MEQ/100ML IV SOLN: 10.0000 meq | INTRAVENOUS | Status: DC

## 2013-02-21 MED ORDER — LACTATED RINGERS IV SOLN
INTRAVENOUS | Status: DC
  Administered 2013-02-21: 07:00:00 via INTRAVENOUS

## 2013-02-21 MED ORDER — LORAZEPAM 2 MG/ML IJ SOLN: 1.0000 mg | Freq: Four times a day (QID) | INTRAMUSCULAR | Status: DC | PRN

## 2013-02-21 MED ORDER — LORAZEPAM 1 MG PO TABS: 1.0000 mg | ORAL_TABLET | Freq: Four times a day (QID) | ORAL | Status: DC | PRN

## 2013-02-21 MED ORDER — FAMOTIDINE IN NACL 20-0.9 MG/50ML-% IV SOLN
20.0000 mg | Freq: Once | INTRAVENOUS | Status: AC
  Administered 2013-02-21: 20 mg via INTRAVENOUS
  Filled 2013-02-21: qty 50

## 2013-02-21 MED ORDER — ONDANSETRON HCL 4 MG/2ML IJ SOLN: 4.0000 mg | Freq: Three times a day (TID) | INTRAMUSCULAR | Status: AC | PRN

## 2013-02-21 MED ORDER — ENOXAPARIN SODIUM 40 MG/0.4ML ~~LOC~~ SOLN
40.0000 mg | SUBCUTANEOUS | Status: DC
  Administered 2013-02-21 – 2013-02-23 (×3): 40 mg via SUBCUTANEOUS
  Filled 2013-02-21 (×4): qty 0.4

## 2013-02-21 MED ORDER — POTASSIUM CHLORIDE 10 MEQ/100ML IV SOLN
10.0000 meq | INTRAVENOUS | Status: AC
  Administered 2013-02-21 (×4): 10 meq via INTRAVENOUS
  Filled 2013-02-21 (×4): qty 100

## 2013-02-21 MED ORDER — LORAZEPAM 2 MG/ML IJ SOLN
0.0000 mg | Freq: Two times a day (BID) | INTRAMUSCULAR | Status: DC
  Administered 2013-02-23: 2 mg via INTRAVENOUS
  Filled 2013-02-21: qty 1

## 2013-02-21 MED ORDER — IOHEXOL 300 MG/ML  SOLN
50.0000 mL | Freq: Once | INTRAMUSCULAR | Status: AC | PRN
  Administered 2013-02-21: 50 mL via ORAL

## 2013-02-21 MED ORDER — SODIUM CHLORIDE 0.9 % IV BOLUS (SEPSIS)
1000.0000 mL | Freq: Once | INTRAVENOUS | Status: AC
  Administered 2013-02-21: 1000 mL via INTRAVENOUS

## 2013-02-21 MED ORDER — DEXTROSE 5 % IV SOLN
1.0000 g | Freq: Once | INTRAVENOUS | Status: AC
  Administered 2013-02-21: 1 g via INTRAVENOUS
  Filled 2013-02-21: qty 10

## 2013-02-21 MED ORDER — VITAMIN B-1 100 MG PO TABS
100.0000 mg | ORAL_TABLET | Freq: Every day | ORAL | Status: DC
  Administered 2013-02-21 – 2013-02-24 (×4): 100 mg via ORAL
  Filled 2013-02-21 (×4): qty 1

## 2013-02-21 MED ORDER — POTASSIUM CHLORIDE 10 MEQ/100ML IV SOLN
10.0000 meq | INTRAVENOUS | Status: AC
  Administered 2013-02-21 (×4): 10 meq via INTRAVENOUS
  Filled 2013-02-21 (×5): qty 100

## 2013-02-21 MED ORDER — LORAZEPAM 2 MG/ML IJ SOLN: 0.0000 mg | Freq: Four times a day (QID) | INTRAMUSCULAR | Status: AC

## 2013-02-21 MED ORDER — THIAMINE HCL 100 MG/ML IJ SOLN
100.0000 mg | Freq: Every day | INTRAMUSCULAR | Status: DC
  Filled 2013-02-21: qty 1

## 2013-02-21 MED ORDER — FOLIC ACID 1 MG PO TABS
1.0000 mg | ORAL_TABLET | Freq: Every day | ORAL | Status: DC
  Administered 2013-02-21 – 2013-02-24 (×4): 1 mg via ORAL
  Filled 2013-02-21 (×4): qty 1

## 2013-02-21 MED ORDER — ADULT MULTIVITAMIN W/MINERALS CH
1.0000 | ORAL_TABLET | Freq: Every day | ORAL | Status: DC
  Administered 2013-02-21 – 2013-02-24 (×4): 1 via ORAL
  Filled 2013-02-21 (×4): qty 1

## 2013-02-21 MED ORDER — MORPHINE SULFATE 2 MG/ML IJ SOLN: 1.0000 mg | INTRAMUSCULAR | Status: DC | PRN

## 2013-02-21 MED ORDER — ONDANSETRON HCL 4 MG/2ML IJ SOLN: 4.0000 mg | Freq: Four times a day (QID) | INTRAMUSCULAR | Status: DC | PRN

## 2013-02-21 MED ORDER — IOHEXOL 300 MG/ML  SOLN
100.0000 mL | Freq: Once | INTRAMUSCULAR | Status: AC | PRN
  Administered 2013-02-21: 100 mL via INTRAVENOUS

## 2013-02-21 MED ORDER — INFLUENZA VAC SPLIT QUAD 0.5 ML IM SUSP
0.5000 mL | INTRAMUSCULAR | Status: AC
  Administered 2013-02-22: 0.5 mL via INTRAMUSCULAR
  Filled 2013-02-21: qty 0.5

## 2013-02-21 NOTE — Progress Notes (Signed)
CRITICAL VALUE ALERT  Critical value received:  Potassium 2.5    Date of notification:  02/21/13  Time of notification:  0304  Critical value read back: yes  Nurse who received alert:  Clarene Essex, RN  MD notified (1st page): Navati   Time of first page:  0304  MD notified (2nd page):  Time of second page:  Responding MD:  Johnney Ou  Time MD responded: yes

## 2013-02-21 NOTE — ED Notes (Signed)
Pt arrived to ED with a complaint of abdominal pain.  Pt has a hx of pancreatitis.  Pt has been consuming alcohol.  Pt has pain that is medially and midline.  Pt has a history of atrial fibrillation.  Pt given 4 of Zofran by EMS

## 2013-02-21 NOTE — ED Notes (Signed)
Bed: ZO10 Expected date: 02/21/13 Expected time: 1:34 AM Means of arrival: Ambulance Comments: 77 yo M  abd pain
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
## 2013-02-21 NOTE — ED Notes (Signed)
Verbal order from [facility], attending MD, to change bed request to telemetry.

## 2013-02-21 NOTE — Progress Notes (Signed)
Family Medicine Teaching Service Daily Progress Note Intern Pager: [phone]  Patient name: [name] Medical record number: [id number] Date of birth: 06/13/1935 Age: 77 y.o. Gender: male  Primary Care Provider: Uvaldo Rising, MD Consultants: None Code Status: Full  Pt Overview and Major Events to Date:   Assessment and Plan: Patient is a 77 year old white man who present with acute pancreatitis with PMH significant for MI w/ CABG 1985 & 1997, Afib, HTN, anemia, chronic alcohol abuse and chronic hyponatremia.  # Acute Pancreatitis - Lipase > 3000; LDH wnl; CT: Haziness of the fat surrounding the uncinate process and pancreas head. No ductal dilatation. No complications of pancreatitis, including fluid collection or necrosis. Ethanol Neg. But report Alcohol consumption yesterday afternoon. AST, ALT, and Alk phos not consistent with gall stone pancreatitis. - Corrected Ca 9.4; continue to monitor  - NPO - Morphine for pain - CBC and CMET in am - IVF to combat 3rd spacing - lipid panel to evaluate for hypertriglyceridemia as a cause  # Afib am LBBB and CAD s/p CABG 1985 & 1997 - Chronic; POA w/o RVR; Only taking metoprolol tartrate  25mg  once a day? at home and reports compliance - BP soft on admission - Holding Metoprolol and ASA  # HTN - BP soft on admission - Holding HCTZ and Lisinopril  # Hx Alcohol Abuse - Ethanol neg on admission; But reports Alcohol consumption yesterday - Elevated LFT: AST/ALT ratio 1.67 - CIWA protocol  - Folate & Thiamine   # Anemia Chronic - Hgb 10.8 w/ normal MCV - CBC tomorrow - Iron studies, B12, Folate, Smear - Will need colonoscopy as outpatient   # Hyponatremia  - Na 126; Hx of chronic hyponatremia likely due to [facility] drinker's potomania; TSH wnl on 10/20/12: Also concern for Third spacing - Will continue to monitor  - CMET in am  # Hypomagnesia - Likely form nutritional deficiency from Alcoholism; - - Mag 2g x 2  - Recheck  tomorrow am  # UTI - UA Nitrite & Leuk (+) w/ many bacteria; WBC slightly elevated 10.7 likely due to dehydration - UCx pending - CTX (started 11/23)  FEN/GI: NPO; NS @ 250 PPx: Lovenox  Disposition: Admit to tele; Attending [place]; Discharge pending improved ab pain and PO intake  Subjective: Currently denies abdominal pain or nausea   Objective: Temp:  [97.7 F (36.5 C)-100.3 F (37.9 C)] 98.9 F (37.2 C) (11/23 1753) Pulse Rate:  [57-95] 68 (11/23 1818) Resp:  [13-22] 16 (11/23 1753) BP: (103-148)/(48-89) 105/50 mmHg (11/23 1818) SpO2:  [91 %-100 %] 98 % (11/23 1818) Physical Exam: Gen: Thin Male in NAD w/o tremor Neuro: A&Ox4; CV: irregular rhythm, No m/r/g; Pulses 2+ UE & LE; No LE edema Lungs: CTAB; Normal Resp. Effort GI: hypoactive BS; No tenderness or masses  Laboratory:  Recent Labs Lab 02/21/13 0215 02/21/13 1455  WBC 7.5 10.7*  HGB 10.8* 9.5*  HCT 29.6* 26.2*  PLT 169 109*    Recent Labs Lab 02/21/13 0215 02/21/13 1455 02/21/13 1830  NA 123* 125* 126*  K 2.5* 3.1* 3.5  CL 82* 89* 90*  CO2 25 25 27   BUN 7 9 11   CREATININE 0.80 0.76 0.85  CALCIUM 9.5 8.1* 8.2*  PROT 6.7  --  5.4*  BILITOT 2.4*  --  3.1*  ALKPHOS 149*  --  140*  ALT 28  --  84*  AST 77*  --  141*  GLUCOSE 123* 100* 93   LDH 178 ETOH <  11 Mag 1.2 Lipase >3000 Troponin <0.30 UA cloudy, spec grav 1.038, ketones 15, Protein 30, nitrite positive, leukocytes small EKG: old LBBB, afib  Imaging/Diagnostic Tests: CT Ab Pancreas: Haziness of the fat surrounding the uncinate process and  head. No ductal dilatation. No fluid collection. No vascular  compromise. IMPRESSION:  1. No complications of pancreatitis, including fluid collection or  necrosis.  2. Ureteral and bladder wall thickening. Correlate with urinalysis.  Dg Abd Acute W/chest  02/21/2013  IMPRESSION: 1. Nonobstructive bowel gas pattern. 2. No evidence of acute cardiopulmonary disease.   Electronically  Signed   By: Tiburcio Pea M.D.   On: 02/21/2013 03:23    Wenda Low, MD 02/21/2013, 10:05 PM PGY-1, [facility] FPTS Intern pager: [phone], text pages welcome   Upper Level Addendum:  I have seen and evaluated this patient along with Dr. Gayla Doss and reviewed the above note, making necessary revisions in red.   Marikay Alar, MD Family Medicine PGY-2

## 2013-02-21 NOTE — ED Notes (Signed)
MD at bedside. 

## 2013-02-21 NOTE — Progress Notes (Signed)
Valuables taken to security office in ED.  Almedia Balls secured belongings in lockbox number [id number].
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
## 2013-02-22 DIAGNOSIS — K859 Acute pancreatitis without necrosis or infection, unspecified: Principal | ICD-10-CM

## 2013-02-22 LAB — CBC
MCH: 33.6 pg (ref 26.0–34.0)
MCHC: 35.2 g/dL (ref 30.0–36.0)
Platelets: 86 10*3/uL — ABNORMAL LOW (ref 150–400)
RBC: 2.62 MIL/uL — ABNORMAL LOW (ref 4.22–5.81)

## 2013-02-22 LAB — COMPREHENSIVE METABOLIC PANEL
ALT: 74 U/L — ABNORMAL HIGH (ref 0–53)
AST: 104 U/L — ABNORMAL HIGH (ref 0–37)
BUN: 13 mg/dL (ref 6–23)
CO2: 25 mEq/L (ref 19–32)
Calcium: 7.8 mg/dL — ABNORMAL LOW (ref 8.4–10.5)
Chloride: 95 mEq/L — ABNORMAL LOW (ref 96–112)
Creatinine, Ser: 0.89 mg/dL (ref 0.50–1.35)
GFR calc non Af Amer: 82 mL/min — ABNORMAL LOW (ref >90)
Sodium: 128 mEq/L — ABNORMAL LOW (ref 135–145)
Total Bilirubin: 2.3 mg/dL — ABNORMAL HIGH (ref 0.3–1.2)

## 2013-02-22 LAB — FOLATE: Folate: 17.8 ng/mL

## 2013-02-22 LAB — C-REACTIVE PROTEIN: CRP: 15.6 mg/dL — ABNORMAL HIGH (ref <0.60)

## 2013-02-22 LAB — SAVE SMEAR

## 2013-02-22 LAB — VITAMIN B12: Vitamin B-12: 758 pg/mL (ref 211–911)

## 2013-02-22 LAB — LIPASE, BLOOD: Lipase: 111 U/L — ABNORMAL HIGH (ref 11–59)

## 2013-02-22 LAB — MAGNESIUM: Magnesium: 1.9 mg/dL (ref 1.5–2.5)

## 2013-02-22 MED ORDER — CIPROFLOXACIN HCL 250 MG PO TABS
250.0000 mg | ORAL_TABLET | Freq: Two times a day (BID) | ORAL | Status: DC
  Administered 2013-02-22 – 2013-02-24 (×5): 250 mg via ORAL
  Filled 2013-02-22 (×7): qty 1

## 2013-02-22 NOTE — Progress Notes (Signed)
Family Medicine Teaching Service Attending Note  I interviewed and examined patient [name] and reviewed their tests and x-rays.  I discussed with Dr. Birdie Sons and reviewed their note for today.  I agree with their assessment and plan.     Additionally  Feels well this AM - no pain nontender to palpation  Pancreatitis?? - would recheck lipase - clinically does not fit UTI? - check culture - change to oral antibiotics until returns

## 2013-02-22 NOTE — Discharge Summary (Signed)
Family Medicine Teaching [facility] Discharge Summary  Patient name: [name] Medical record number: [id number] Date of birth: 20-Aug-1935 Age: 77 y.o. Gender: male Date of Admission: 02/21/2013  Date of Discharge: 02/23/13 Admitting Physician: Carney Living, MD  Primary Care Provider: Uvaldo Rising, MD Consultants: None  Indication for Hospitalization: Pancreatitis  Discharge Diagnoses/Problem List:  Patient Active Problem List   Diagnosis Date Noted  . Acute alcoholic pancreatitis 02/21/2013  . Hypomagnesemia 02/21/2013  . Anemia, chronic disease 02/21/2013  . Alcohol dependence 10/25/2012    Class: Acute  . Hyponatremia 09/13/2012  . Hypokalemia 09/13/2012  . Atrial fibrillation 12/14/2009  . Chronic diastolic heart failure 12/12/2009  . LBBB 04/26/2009  . CORONARY ARTERY DISEASE 08/11/2007  . PROSTATE CANCER, HX OF 05/29/2006   Disposition: Home  Discharge Condition: Stable  Discharge Exam:  Gen: Thin Male in NAD w/o tremor  Neuro: A&Ox4;  CV: irregular rhythm, No m/r/g; Pulses 2+ UE & LE; No LE edema  Lungs: CTAB; Normal Resp. Effort  GI: + BS; No tenderness or masses  Brief Hospital Course: Patient is a 77 year old white man who presented with acute pancreatitis with PMH significant for MI w/ CABG 1985 & 1997, Afib, HTN, anemia, chronic alcohol abuse and chronic hyponatremia. He was made NPO, given IVFs, and morphine for pain. His low potassium and magnesium was replaced and were normal at discharge. His diet was advanced as tolerated, and at discharge was tolerating full meals and his stomach pain had resolved.   Acute Pancreatitis: Initial lipase 3000, CRP 15.6, and CT showed haziness of the fat surrounding the uncinate process and pancreas head. No ductal dilatation. No complications of pancreatitis, including fluid collection or necrosis. His ethanol level was negative, but he reported drinking several beers the evening before admission. His  abdominal pain quickly resolved the next morning and his diet was advanced. At discharge his lipase was 111. Alcohol cessation was discussed.   Hypokalemia and Hypomagnesia:  K 2.5 and Mg 1.2 on admission. Likely due to alcoholism and vomiting. Both were replaced and wnl at discharge.   Cardiac: Afib w/ LBBB, HTN and CAD s/p CABG 1985 & 1997 (POA): His BP medications were initially held due to hypotension. Home medications were restarted as tolerated and at discharged was back on home medications (see below).   Anemia Chronic (POA): Hgb decreased from 10.8 to 8.8 with aggressive IVF for hydration.  Iron was low, but B12, Folate, ferritin and TIBC were normal indicating anemia of chronic disease. Would recommend colonoscopy as outpatient   Hyponatremia (POA): Hx of chronic hyponatremia likely due to [facility] drinker's potomania; TSH wnl on 10/20/12.   UTI (POA): UA Nitrite & Leuk (+) w/ many bacteria; WBC slightly elevated 10.7 likely due to dehydration. CTX given in the ED. Culture grew KLEBSIELLA PNEUMONIAE sensitive to Cipro. Cipro given in hospital for 2 days and given Rx to complete 10 day course.   Issues for Follow Up:  1. Finish Cipro? (last dose 12/2) 2. Discuss Alcohol cessation 3. Discuss colonscopy 4. Check BMET and CBC  Significant Procedures: None  Significant Labs and Imaging:   Recent Labs Lab 02/21/13 0215 02/21/13 1455 02/22/13 0326  WBC 7.5 10.7* 5.6  HGB 10.8* 9.5* 8.8*  HCT 29.6* 26.2* 25.0*  PLT 169 109* 86*    Recent Labs Lab 02/21/13 0215 02/21/13 1455 02/21/13 1830 02/22/13 0326  NA 123* 125* 126* 128*  K 2.5* 3.1* 3.5 4.0  CL 82* 89* 90* 95*  CO2  25 25 27 25   GLUCOSE 123* 100* 93 86  BUN 7 9 11 13   CREATININE 0.80 0.76 0.85 0.89  CALCIUM 9.5 8.1* 8.2* 7.8*  MG 1.2* 1.4*  --  1.9  ALKPHOS 149*  --  140* 130*  AST 77*  --  141* 104*  ALT 28  --  84* 74*  ALBUMIN 3.4*  --  2.5* 2.4*     Results/Tests Pending at Time of Discharge:   Discharge  Medications:    Medication List         aspirin EC 81 MG tablet  Take 81 mg by mouth daily.     ciprofloxacin 250 MG tablet  Commonly known as:  CIPRO  Take 1 tablet (250 mg total) by mouth 2 (two) times daily.     Fish Oil 1200 MG Caps  Take 1,200 mg by mouth daily.     hydrochlorothiazide 25 MG tablet  Commonly known as:  HYDRODIURIL  Take 25 mg by mouth daily.     lisinopril 5 MG tablet  Commonly known as:  PRINIVIL,ZESTRIL  Take 5 mg by mouth daily.     metoprolol tartrate 25 MG tablet  Commonly known as:  LOPRESSOR  Take 1 tablet (25 mg total) by mouth 2 (two) times daily.     MULTIVITAMIN PO  Take 1 tablet by mouth daily.     nitroGLYCERIN 0.4 MG SL tablet  Commonly known as:  NITROSTAT  Place 0.4 mg under the tongue every 5 (five) minutes as needed for chest pain.     omeprazole 20 MG capsule  Commonly known as:  PRILOSEC  Take 20 mg by mouth daily.     oxybutynin 5 MG tablet  Commonly known as:  DITROPAN  Take 5 mg by mouth at bedtime.     simvastatin 20 MG tablet  Commonly known as:  ZOCOR  Take 20 mg by mouth every evening.     traMADol 50 MG tablet  Commonly known as:  ULTRAM  Take 50 mg by mouth every 8 (eight) hours as needed.        Discharge Instructions: Please refer to Patient Instructions section of EMR for full details.  Patient was counseled important signs and symptoms that should prompt return to medical care, changes in medications, dietary instructions, activity restrictions, and follow up appointments.   Follow-Up Appointments: Follow-up Information   Schedule an appointment as soon as possible for a visit with Uvaldo Rising, MD.   Specialty:  Family Medicine   Contact information:   [address] [place] [address] [phone]       Wenda Low, MD 02/23/2013, 2:59 PM PGY-1, [facility] Family Medicine

## 2013-02-22 NOTE — Progress Notes (Signed)
Family Medicine Teaching Service Daily Progress Note Intern Pager: [phone]  Patient name: [name] Medical record number: [id number] Date of birth: 1935-04-16 Age: 77 y.o. Gender: male  Primary Care Provider: Uvaldo Rising, MD Consultants: None Code Status: Full  Pt Overview and Major Events to Date:  11/23: Acute pancreatitis; Lipase > 3000; LDH wnl; Afib; Soft BP; UTI (CTX started); Low: Na, Ka, Ma  Assessment and Plan: Patient is a 77 year old white man who present with acute pancreatitis with PMH significant for MI w/ CABG 1985 & 1997, Afib, HTN, anemia, chronic alcohol abuse and chronic hyponatremia.   # Acute Pancreatitis  - Lipase > 3000; LDH wnl; CT: Haziness of the fat surrounding the uncinate process and pancreas head. No ductal dilatation. No complications of pancreatitis, including fluid collection or necrosis. Ethanol Neg. But report Alcohol consumption yesterday afternoon. AST, ALT, and Alk phos not consistent with gall stone pancreatitis.  - Corrected Ca 9.1 on 11/23; continue to monitor (Corrected Ca 9.4 on admit) - Clear Liquid; Morphine for pain  - IVF @ 77 - lipid panel: TGs 68; LDL 26 Tchol 104 - UDS neg - UOP: Unknown due to incontinence:  # Cardiac: Afib w/ LBBB, HTN and CAD s/p CABG 1985 & 1997 (POA) - Chronic; POA w/o RVR; Only taking metoprolol tartrate 25mg  once a day? at home and reports compliance  - BP last 24: (103-118)/(48-60) 117/60 mmHg  - Holding HCTZ, Lisinopril, ASA - Restart Metoprolol  # Hx Alcohol Abuse  - Ethanol neg on admission; But reports Alcohol consumption yesterday  - Elevated LFT: AST/ALT ratio 1.67  - CIWA protocol: score 0 in last 24hrs - Folate & Thiamine   # Anemia Chronic (POA) - Hgb 10.8 w/ normal MCV  - Will need colonoscopy as outpatient  - Iron studies, B12, Folate, Smear pending - Hgb 8.8 today   #Thrombocytopenia (POA) - Likely multifactorial due to alcohol use, possible nutritional deficiencies and  dilutional   - plts 86 < 169 on 11/23 - Will continue to monitor Pt on (Lovenox for VTE prophlaxis) - Holding ASA  # Hyponatremia (POA) - Na 126; Hx of chronic hyponatremia likely due to [facility] drinker's potomania; TSH wnl on 10/20/12: Also concern for Third spacing - Will continue to monitor  - Na 128 today  # UTI (POA) - UA Nitrite & Leuk (+) w/ many bacteria; WBC slightly elevated 10.7 likely due to dehydration  - UCx pending  - Cipro: today; CTX given in ED (abx day 2)  - WBC 5.6 < 10.7 (11/24)  # Hypomagnesia (POA): Resolved  - Likely form nutritional deficiency from Alcoholism; -  - Mag 2g x 2  - Mg 1.9 today: will continue to monitor  FEN/GI: Clear Liquid diet; NS @ 77  PPx: Lovenox   Disposition: Attending [place]; Discharge pending improved ab pain and PO intake  Subjective: Pain is much improved this morning with minimal nausea. Complains of suprapubic soreness  Objective: Temp:  [98.6 F (37 C)-100.3 F (37.9 C)] 98.7 F (37.1 C) (11/24 0506) Pulse Rate:  [57-95] 72 (11/24 0506) Resp:  [13-18] 18 (11/24 0506) BP: (103-118)/(48-60) 117/60 mmHg (11/24 0506) SpO2:  [91 %-98 %] 98 % (11/24 0506) Weight:  [170 lb (77.111 kg)] 170 lb (77.111 kg) (11/24 0018) Physical Exam: Gen: Thin Male in NAD w/o tremor  Neuro: A&Ox4;  CV: irregular rhythm, No m/r/g; Pulses 2+ UE & LE; No LE edema  Lungs: CTAB; Normal Resp. Effort  GI: hypoactive BS; No  tenderness or masses  Laboratory:  Recent Labs Lab 02/21/13 0215 02/21/13 1455 02/22/13 0326  WBC 7.5 10.7* 5.6  HGB 10.8* 9.5* 8.8*  HCT 29.6* 26.2* 25.0*  PLT 169 109* 86*    Recent Labs Lab 02/21/13 0215 02/21/13 1455 02/21/13 1830 02/22/13 0326  NA 123* 125* 126* 128*  K 2.5* 3.1* 3.5 4.0  CL 82* 89* 90* 95*  CO2 25 25 27 25   BUN 7 9 11 13   CREATININE 0.80 0.76 0.85 0.89  CALCIUM 9.5 8.1* 8.2* 7.8*  PROT 6.7  --  5.4* 5.3*  BILITOT 2.4*  --  3.1* 2.3*  ALKPHOS 149*  --  140* 130*  ALT 28  --  84* 74*   AST 77*  --  141* 104*  GLUCOSE 123* 100* 93 86   LDH 178  ETOH <11  Mag 1.2  Lipase >3000  Troponin <0.30  UA cloudy, spec grav 1.038, ketones 15, Protein 30, nitrite positive, leukocytes small  EKG: old LBBB, afib  I maging/Diagnostic Tests:  CT Ab  Pancreas: Haziness of the fat surrounding the uncinate process and  head. No ductal dilatation. No fluid collection. No vascular  compromise.  IMPRESSION:  1. No complications of pancreatitis, including fluid collection or  necrosis.  2. Ureteral and bladder wall thickening. Correlate with urinalysis.   Dg Abd Acute W/chest  02/21/2013 IMPRESSION: 1. Nonobstructive bowel gas pattern. 2. No evidence of acute cardiopulmonary disease. Electronically Signed By: Tiburcio Pea M.D. On: 02/21/2013 03:23    Wenda Low, MD 02/22/2013, 6:13 AM PGY-1, [facility] Family Medicine FPTS Intern pager: [phone], text pages welcome

## 2013-02-22 NOTE — Progress Notes (Signed)
FMTS Attending Note  The plan of care was discussed with the resident team. I agree with the assessment and plan as documented by the resident.   Sarath Privott MD 

## 2013-02-23 DIAGNOSIS — I2510 Atherosclerotic heart disease of native coronary artery without angina pectoris: Secondary | ICD-10-CM

## 2013-02-23 LAB — CBC
MCHC: 35.9 g/dL (ref 30.0–36.0)
Platelets: 110 10*3/uL — ABNORMAL LOW (ref 150–400)
RDW: 14 % (ref 11.5–15.5)

## 2013-02-23 LAB — URINE CULTURE: Colony Count: >=100000

## 2013-02-23 MED ORDER — METOPROLOL TARTRATE 25 MG PO TABS: 25.0000 mg | ORAL_TABLET | Freq: Two times a day (BID) | ORAL | Status: DC

## 2013-02-23 MED ORDER — METOPROLOL TARTRATE 25 MG PO TABS
25.0000 mg | ORAL_TABLET | Freq: Two times a day (BID) | ORAL | Status: DC
  Administered 2013-02-23 – 2013-02-24 (×2): 25 mg via ORAL
  Filled 2013-02-23 (×3): qty 1

## 2013-02-23 MED ORDER — CIPROFLOXACIN HCL 250 MG PO TABS: 250.0000 mg | ORAL_TABLET | Freq: Two times a day (BID) | ORAL | Status: DC

## 2013-02-23 NOTE — Progress Notes (Signed)
FMTS Attending Note  I personally saw and evaluated the patient. The plan of care was discussed with the resident team. I agree with the assessment and plan as documented by the resident.   Glennie Bose MD 

## 2013-02-23 NOTE — Progress Notes (Signed)
Family Medicine Teaching Service Daily Progress Note Intern Pager: [phone]  Patient name: [name] Medical record number: [id number] Date of birth: 11-Sep-1935 Age: 77 y.o. 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   # Acute Pancreatitis  - Lipase > 3000; LDH wnl; CT: Haziness of the fat surrounding the uncinate process and pancreas head. No ductal dilatation. No complications of pancreatitis, including fluid collection or necrosis. Ethanol Neg. But report Alcohol consumption yesterday afternoon. AST, ALT, and Alk phos not consistent with gall stone pancreatitis.  - Corrected Ca 9.1 on 11/23; continue to monitor (Corrected Ca 9.4 on admit) - Reg Diet; Morphine for pain  - lipid panel: TGs 68; LDL 26 Tchol 104 - UDS neg - UOP: Unknown due to incontinence:  # Confusion - Patient became confused after getting ativan per CIWA protocol with unsteady gait - PT consult  # Cardiac: Afib w/ LBBB, HTN and CAD s/p CABG 1985 & 1997 (POA) - Chronic; POA w/o RVR; Only taking metoprolol tartrate 25mg  once a day? at home and reports compliance  - Holding HCTZ, Lisinopril, ASA - Restart Metoprolol  # Hx Alcohol Abuse  - Ethanol neg on admission; But reports Alcohol consumption yesterday  - Elevated LFT: AST/ALT ratio 1.67  - CIWA protocol: score 0 in last 24hrs - Folate & Thiamine   # Anemia Chronic (POA) - Hgb 10.8 w/ normal MCV  - Will need colonoscopy as outpatient  - Hgb 8.8 today; recheck  tomorrow  #Thrombocytopenia (POA) - Likely multifactorial due to alcohol use, possible nutritional deficiencies and dilutional   - plts 86 < 169 on 11/23 - Will continue to monitor Pt on (Lovenox for VTE prophlaxis) - Holding ASA  # Hyponatremia (POA) - Na 126; Hx of chronic hyponatremia likely due to [facility] drinker's potomania; TSH wnl on 10/20/12: Also concern for Third spacing - Will continue to monitor  - Na 128 today  # UTI (POA) - UA Nitrite & Leuk (+) w/ many bacteria; WBC slightly elevated 10.7 likely due to dehydration  - UCx pending  - Cipro: day 3; CTX given in ED (abx day 2)  - WBC 5.6 < 10.7 (11/24)  # Hypomagnesia (POA): Resolved  - Likely form nutritional deficiency from Alcoholism; -  - Mag 2g x 2  - Mg 1.9 today: will continue to monitor  FEN/GI: Clear Liquid diet; NS @ 77  PPx: Lovenox   Disposition: Attending Fletke; Discharge pending improved ab pain and PO intake  Subjective: Pain is much improved this morning with minimal nausea. Complains of suprapubic soreness  Objective: Temp:  [97.7 F (36.5 C)-99.1 F (37.3 C)] 97.7 F (36.5 C) (11/25 1442) Pulse Rate:  [83-97] 96 (11/25 1442) Resp:  [16-18] 16 (11/25 1442) BP: (122-124)/(68-85) 123/83 mmHg (11/25 1442) SpO2:  [98 %-100 %] 98 % (11/25 1442) Physical Exam: Gen: Thin Male in NAD w/o tremor  Neuro: A&Ox4;  CV: irregular rhythm, No m/r/g; Pulses 2+ UE & LE; No LE edema  Lungs: CTAB; Normal Resp. Effort  GI: hypoactive  BS; No tenderness or masses  Laboratory:  Recent Labs Lab 02/21/13 0215 02/21/13 1455 02/22/13 0326  WBC 7.5 10.7* 5.6  HGB 10.8* 9.5* 8.8*  HCT 29.6* 26.2* 25.0*  PLT 169 109* 86*    Recent Labs Lab 02/21/13 0215 02/21/13 1455 02/21/13 1830 02/22/13 0326  NA 123* 125* 126* 128*  K 2.5* 3.1* 3.5 4.0  CL 82* 89* 90* 95*  CO2 25 25 27 25   BUN 7 9 11 13   CREATININE 0.80 0.76 0.85 0.89  CALCIUM 9.5 8.1* 8.2* 7.8*  PROT 6.7  --  5.4* 5.3*  BILITOT 2.4*  --  3.1*  2.3*  ALKPHOS 149*  --  140* 130*  ALT 28  --  84* 74*  AST 77*  --  141* 104*  GLUCOSE 123* 100* 93 86   LDH 178  ETOH <11  Mag 1.2  Lipase >3000  Troponin <0.30  UA cloudy, spec grav 1.038, ketones 15, Protein 30, nitrite positive, leukocytes small  EKG: old LBBB, afib   maging/Diagnostic Tests:  CT Ab  Pancreas: Haziness of the fat surrounding the uncinate process and  head. No ductal dilatation. No fluid collection. No vascular  compromise.  IMPRESSION:  1. No complications of pancreatitis, including fluid collection or  necrosis.  2. Ureteral and bladder wall thickening. Correlate with urinalysis.   Dg Abd Acute W/chest  02/21/2013 IMPRESSION: 1. Nonobstructive bowel gas pattern. 2. No evidence of acute cardiopulmonary disease. Electronically Signed By: Tiburcio Pea M.D. On: 02/21/2013 03:23    Wenda Low, MD 02/23/2013, 5:20 PM PGY-1, [facility] Family Medicine FPTS Intern pager: [phone], text pages welcome

## 2013-02-23 NOTE — Progress Notes (Addendum)
Patient was complaining of body aches and headaches so I gave him 2mg  of Ativan per ciwa protocol. Patient started displaying confusion and disorientation. He had an bowel accident and pulled out his arm. Family practice Md was informed and came to assess the patient. Md. was also made aware that the patient's heart rate increase when he get up. Md informed both (me) nurse and patient that he would not be d/c'd tonight. That he was going to keep him one more next to access home needs.

## 2013-02-23 NOTE — Discharge Summary (Addendum)
FMTS Attending Note  I personally saw and evaluated the patient. The plan of care was discussed with the resident team. I agree with the assessment and plan as documented by the resident.   1. Acute Pancreatitis - resolved, patient tolerating diet without pain, due to chronic alcohol abuse. 2. Hyponatremia - stable, due to beer potomania 3. UTI - currently asymptomatic, bladder scan wnl, suspect that BPH/urine retention may have contributed to UTI, will need follow up with urology as an outpatient 4. Afib/HTN - stable  Agree with Discharge Summary as documented.  Donnella Sham MD   Addendum (02/24/13): Prior to discharge patient received ativan per CIWA protocol and became acutely confused, This was likely due to ativan. Patient's mental status is back to baseline this AM. He is stable to discharge. Has no alcohol withdrawal symptoms.   Donnella Sham MD

## 2013-02-24 DIAGNOSIS — I10 Essential (primary) hypertension: Secondary | ICD-10-CM

## 2013-02-24 DIAGNOSIS — N401 Enlarged prostate with lower urinary tract symptoms: Secondary | ICD-10-CM

## 2013-02-24 DIAGNOSIS — E876 Hypokalemia: Secondary | ICD-10-CM

## 2013-02-24 DIAGNOSIS — N390 Urinary tract infection, site not specified: Secondary | ICD-10-CM

## 2013-02-24 DIAGNOSIS — I4891 Unspecified atrial fibrillation: Secondary | ICD-10-CM

## 2013-02-24 DIAGNOSIS — E871 Hypo-osmolality and hyponatremia: Secondary | ICD-10-CM

## 2013-02-24 DIAGNOSIS — D638 Anemia in other chronic diseases classified elsewhere: Secondary | ICD-10-CM

## 2013-02-24 LAB — BASIC METABOLIC PANEL
BUN: 12 mg/dL (ref 6–23)
CO2: 22 mEq/L (ref 19–32)
Calcium: 8.1 mg/dL — ABNORMAL LOW (ref 8.4–10.5)
Chloride: 99 mEq/L (ref 96–112)
GFR calc Af Amer: >90 mL/min (ref >90)
GFR calc non Af Amer: 89 mL/min — ABNORMAL LOW (ref >90)
Sodium: 132 mEq/L — ABNORMAL LOW (ref 135–145)

## 2013-02-24 LAB — CBC
HCT: 25.1 % — ABNORMAL LOW (ref 39.0–52.0)
Hemoglobin: 8.6 g/dL — ABNORMAL LOW (ref 13.0–17.0)
MCH: 33 pg (ref 26.0–34.0)
MCHC: 34.3 g/dL (ref 30.0–36.0)
Platelets: 116 10*3/uL — ABNORMAL LOW (ref 150–400)
RBC: 2.61 MIL/uL — ABNORMAL LOW (ref 4.22–5.81)
RDW: 13.9 % (ref 11.5–15.5)
WBC: 3.9 10*3/uL — ABNORMAL LOW (ref 4.0–10.5)

## 2013-02-24 MED ORDER — POTASSIUM CHLORIDE CRYS ER 20 MEQ PO TBCR
40.0000 meq | EXTENDED_RELEASE_TABLET | Freq: Once | ORAL | Status: DC
  Filled 2013-02-24: qty 2

## 2013-02-24 MED ORDER — ASPIRIN EC 81 MG PO TBEC
81.0000 mg | DELAYED_RELEASE_TABLET | Freq: Every day | ORAL | Status: DC
Start: 2013-02-24 — End: 2013-02-24
  Filled 2013-02-24: qty 1

## 2013-02-24 MED ORDER — LISINOPRIL 5 MG PO TABS
5.0000 mg | ORAL_TABLET | Freq: Every day | ORAL | Status: DC
  Filled 2013-02-24: qty 1

## 2013-02-24 NOTE — Progress Notes (Signed)
Family Medicine Teaching Service Daily Progress Note Intern Pager: [phone]  Patient name: [name] Medical record number: [id number] Date of birth: September 05, 1935 Age: 77 y.o. 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   # Acute Pancreatitis  - Lipase > 3000; LDH wnl; CT: Haziness of the fat surrounding the uncinate process and pancreas head. No ductal dilatation. No complications of pancreatitis, including fluid collection or necrosis. Ethanol Neg. But report Alcohol consumption yesterday afternoon. AST, ALT, and Alk phos not consistent with gall stone pancreatitis.  - Corrected Ca 9.1 on 11/23; continue to monitor (Corrected Ca 9.4 on admit) - Reg Diet; Morphine for pain  - lipid panel: TGs 68; LDL 26 Tchol 104 - UDS neg - UOP: Unknown due to incontinence:  # Confusion - Patient became confused after getting ativan per CIWA protocol with unsteady gait - Confusion resolved; A&Ox 4  # Cardiac: Afib w/ LBBB, HTN and CAD s/p CABG 1985 & 1997 (POA) - Chronic; POA w/o RVR; Only taking metoprolol tartrate 25mg  once a day? at home and reports compliance  - Holding HCTZ, Lisinopril, ASA - Restart Metoprolol  # Hx Alcohol Abuse  - Ethanol neg on admission; But reports Alcohol consumption yesterday  - Elevated LFT: AST/ALT ratio 1.67  - CIWA protocol: score 0 in last 24hrs - Folate & Thiamine   # Anemia Chronic (POA) - Hgb 10.8 w/ normal MCV  - Will need colonoscopy as outpatient  - Hgb 8.8  today  #Thrombocytopenia (POA) - Likely multifactorial due to alcohol use, possible nutritional deficiencies and dilutional   - plts 86 < 169 on 11/23 - Will continue to monitor Pt on (Lovenox for VTE prophlaxis) - Holding ASA  # Hyponatremia (POA) - Na 126; Hx of chronic hyponatremia likely due to [facility] drinker's potomania; TSH wnl on 10/20/12: Also concern for Third spacing - Will continue to monitor  - Na 128 today  # UTI (POA) - UA Nitrite & Leuk (+) w/ many bacteria; WBC slightly elevated 10.7 likely due to dehydration  - UCx pending  - Cipro: day 4; CTX given in ED (abx day 5)  - WBC 3.9 < 10.7 (11/24)  # Hypomagnesia (POA): Resolved  - Likely form nutritional deficiency from Alcoholism; -  - Mag 2g x 2  - Mg 1.9: will continue to monitor  FEN/GI: Full diet; KVO PPx: Lovenox   Disposition: Attending Fletke; Discharge pending improved ab pain and PO intake  Subjective: No complaints this morning  Objective: Temp:  [97.7 F (36.5 C)-98.2 F (36.8 C)] 98.2 F (36.8 C) (11/26 0504) Pulse Rate:  [92-99] 99 (11/26 0504) Resp:  [16-18] 17 (11/26 0504) BP: (118-127)/(71-83) 118/77 mmHg (11/26 0504) SpO2:  [98 %] 98 % (11/26 0504)   Physical Exam: Gen: Thin Male in NAD w/o tremor  Neuro: A&Ox4;  CV: irregular rhythm, No m/r/g; Pulses 2+ UE & LE; No LE edema  Lungs: CTAB; Normal Resp. Effort  GI: hypoactive BS; No tenderness or masses  Laboratory:  Recent Labs Lab 02/22/13 0326  02/23/13 1616 02/24/13 0618  WBC 5.6 5.3 3.9*  HGB 8.8* 9.4* 8.6*  HCT 25.0* 26.2* 25.1*  PLT 86* 110* 116*    Recent Labs Lab 02/21/13 0215 02/21/13 1455 02/21/13 1830 02/22/13 0326  NA 123* 125* 126* 128*  K 2.5* 3.1* 3.5 4.0  CL 82* 89* 90* 95*  CO2 25 25 27 25   BUN 7 9 11 13   CREATININE 0.80 0.76 0.85 0.89  CALCIUM 9.5 8.1* 8.2* 7.8*  PROT 6.7  --  5.4* 5.3*  BILITOT 2.4*  --  3.1* 2.3*  ALKPHOS 149*  --  140* 130*  ALT 28  --  84* 74*  AST 77*  --  141* 104*  GLUCOSE  123* 100* 93 86   LDH 178  ETOH <11  Mag 1.2  Lipase >3000  Troponin <0.30  UA cloudy, spec grav 1.038, ketones 15, Protein 30, nitrite positive, leukocytes small  EKG: old LBBB, afib   maging/Diagnostic Tests:  CT Ab  Pancreas: Haziness of the fat surrounding the uncinate process and  head. No ductal dilatation. No fluid collection. No vascular  compromise.  IMPRESSION:  1. No complications of pancreatitis, including fluid collection or  necrosis.  2. Ureteral and bladder wall thickening. Correlate with urinalysis.   Dg Abd Acute W/chest  02/21/2013 IMPRESSION: 1. Nonobstructive bowel gas pattern. 2. No evidence of acute cardiopulmonary disease. Electronically Signed By: Tiburcio Pea M.D. On: 02/21/2013 03:23   Wenda Low, MD 02/24/2013, 7:30 AM PGY-1, [facility] Family Medicine FPTS Intern pager: [phone], text pages welcome

## 2013-02-24 NOTE — Progress Notes (Signed)
Family Medicine Teaching Service Daily Progress Note Intern Pager: [phone]  Patient name: [name] Medical record number: [id number] Date of birth: [birth year] Age: 77 y.o. 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   # Acute Pancreatitis  - Lipase > 3000; LDH wnl; CT: Haziness of the fat surrounding the uncinate process and pancreas head. No ductal dilatation. No complications of pancreatitis, including fluid collection or necrosis. Ethanol Neg. But report Alcohol consumption yesterday afternoon. AST, ALT, and Alk phos not consistent with gall stone pancreatitis.  - Corrected Ca 9.1 on 11/23; continue to monitor (Corrected Ca 9.4 on admit) - Reg Diet; Morphine for pain  - lipid panel: TGs 68; LDL 26 Tchol 104 - UDS neg  # Confusion: Resolved  - Patient became confused after getting ativan per CIWA protocol with unsteady gait - PT consulted  # Cardiac: Afib w/ LBBB, HTN and CAD s/p CABG 1985 & 1997 (POA) - Chronic; POA w/o RVR; Only taking metoprolol tartrate 25mg  once a day? at home and reports compliance  - Holding HCTZ, Lisinopril, ASA - Restart Metoprolol  # Hx Alcohol Abuse  - Ethanol neg on admission; But reports Alcohol consumption yesterday  - Elevated LFT: AST/ALT ratio 1.67  - CIWA protocol: score 0 in last 24hrs - Folate & Thiamine   # Anemia Chronic (POA) - Hgb 10.8 w/ normal MCV  - Will need colonoscopy as outpatient  - Hgb 8.6 today  #Thrombocytopenia (POA) - Likely  multifactorial due to alcohol use, possible nutritional deficiencies and dilutional   - plts 86 < 169 on 11/23 - Will continue to monitor Pt on (Lovenox for VTE prophlaxis) - ASA  # Hyponatremia (POA) - Na 126; Hx of chronic hyponatremia likely due to [facility] drinker's potomania; TSH wnl on 10/20/12: Also concern for Third spacing - Will continue to monitor  - Na 132 today  # UTI (POA) - UA Nitrite & Leuk (+) w/ many bacteria; WBC slightly elevated 10.7 likely due to dehydration  - UCx pending  - Cipro: day 3; CTX given in ED (abx day 2)  - WBC 5.6 < 10.7 (11/24)  # Hypomagnesia (POA): Resolved  - Likely form nutritional deficiency from Alcoholism; -  - Mag 2g x 2  - Mg 1.9 today: will continue to monitor  FEN/GI: Full diet; SLIV PPx: Lovenox   Disposition: Attending Fletke; Discharge pending improved ab pain and PO intake  Subjective: No complaints this morning Objective: Temp:  [97.7 F (36.5 C)-98.2 F (36.8 C)] 98.2 F (36.8 C) (11/26 0504) Pulse Rate:  [92-99] 99 (11/26 0504) Resp:  [16-18] 17 (11/26 0504) BP: (118-127)/(71-83) 118/77 mmHg (11/26 0504) SpO2:  [98 %] 98 % (11/26 0504) Physical Exam: Gen: Thin Male in NAD  Neuro: A&Ox4;  CV: irregular rhythm, No m/r/g; Pulses 2+ UE & LE; No LE edema  Lungs: CTAB; Normal Resp. Effort  GI: + BS; No tenderness or masses  Laboratory:  Recent Labs Lab 02/22/13 0326 02/23/13 1616 02/24/13 0618  WBC 5.6 5.3 3.9*  HGB  8.8* 9.4* 8.6*  HCT 25.0* 26.2* 25.1*  PLT 86* 110* 116*    Recent Labs Lab 02/21/13 0215  02/21/13 1830 02/22/13 0326 02/24/13 0618  NA 123*  < > 126* 128* 132*  K 2.5*  < > 3.5 4.0 3.0*  CL 82*  < > 90* 95* 99  CO2 25  < > 27 25 22   BUN 7  < > 11 13 12   CREATININE 0.80  < > 0.85 0.89 0.74  CALCIUM 9.5  < > 8.2* 7.8* 8.1*  PROT 6.7  --  5.4* 5.3*  --   BILITOT 2.4*  --  3.1* 2.3*  --   ALKPHOS 149*  --  140* 130*  --   ALT 28  --  84* 74*  --   AST 77*  --  141* 104*  --   GLUCOSE 123*  <  > 93 86 94  < > = values in this interval not displayed. LDH 178  ETOH <11  Mag 1.2  Lipase >3000  Troponin <0.30  UA cloudy, spec grav 1.038, ketones 15, Protein 30, nitrite positive, leukocytes small  EKG: old LBBB, afib   maging/Diagnostic Tests:  CT Ab  Pancreas: Haziness of the fat surrounding the uncinate process and  head. No ductal dilatation. No fluid collection. No vascular  compromise.  IMPRESSION:  1. No complications of pancreatitis, including fluid collection or  necrosis.  2. Ureteral and bladder wall thickening. Correlate with urinalysis.   Dg Abd Acute W/chest  02/21/2013 IMPRESSION: 1. Nonobstructive bowel gas pattern. 2. No evidence of acute cardiopulmonary disease. Electronically Signed By: Tiburcio Pea M.D. On: 02/21/2013 03:23    Wenda Low, MD 02/24/2013, 9:18 AM PGY-1, [facility] FPTS Intern pager: [phone], text pages welcome

## 2013-02-24 NOTE — Progress Notes (Signed)
FMTS Attending Note  I personally saw and evaluated the patient. The plan of care was discussed with the resident team. I agree with the assessment and plan as documented by the resident.   Mental status back to baseline. No need for PT consult at this time. Stable for discharge with outpatient follow up.  Donnella Sham MD

## 2013-02-24 NOTE — Progress Notes (Signed)
1056 Patient discharged to home.  Discharge instructions provided and patient verbalizes understanding.  No further questions asked.  Escorted to vehicle by NT at 1150; all patient belongings in tow.  Patient valuable items returned to patient from safe.

## 2013-02-26 NOTE — Progress Notes (Signed)
FMTS Attending Note  I personally saw and evaluated the patient. The plan of care was discussed with the resident team. I agree with the assessment and plan as documented by the resident.   

## 2013-03-01 ENCOUNTER — Inpatient Hospital Stay: Payer: Self-pay | Admitting: Family Medicine

## 2013-03-16 ENCOUNTER — Encounter: Payer: Self-pay | Admitting: Family Medicine

## 2013-03-16 ENCOUNTER — Ambulatory Visit (INDEPENDENT_AMBULATORY_CARE_PROVIDER_SITE_OTHER): Payer: Medicare Other | Admitting: Family Medicine

## 2013-03-16 VITALS — BP 156/90 | HR 99 | Ht 72.0 in | Wt 169.0 lb

## 2013-03-16 DIAGNOSIS — N390 Urinary tract infection, site not specified: Secondary | ICD-10-CM | POA: Insufficient documentation

## 2013-03-16 DIAGNOSIS — D638 Anemia in other chronic diseases classified elsewhere: Secondary | ICD-10-CM

## 2013-03-16 DIAGNOSIS — I2510 Atherosclerotic heart disease of native coronary artery without angina pectoris: Secondary | ICD-10-CM

## 2013-03-16 DIAGNOSIS — E876 Hypokalemia: Secondary | ICD-10-CM

## 2013-03-16 DIAGNOSIS — Z8719 Personal history of other diseases of the digestive system: Secondary | ICD-10-CM

## 2013-03-16 DIAGNOSIS — E871 Hypo-osmolality and hyponatremia: Secondary | ICD-10-CM

## 2013-03-16 DIAGNOSIS — D649 Anemia, unspecified: Secondary | ICD-10-CM

## 2013-03-16 DIAGNOSIS — I10 Essential (primary) hypertension: Secondary | ICD-10-CM

## 2013-03-16 MED ORDER — ATORVASTATIN CALCIUM 40 MG PO TABS: 40.0000 mg | ORAL_TABLET | Freq: Every day | ORAL | Status: DC

## 2013-03-16 MED ORDER — HYDROCHLOROTHIAZIDE 25 MG PO TABS: 25.0000 mg | ORAL_TABLET | Freq: Every day | ORAL | Status: DC

## 2013-03-16 MED ORDER — OXYBUTYNIN CHLORIDE 5 MG PO TABS: 5.0000 mg | ORAL_TABLET | Freq: Every day | ORAL | Status: DC

## 2013-03-16 MED ORDER — LISINOPRIL 5 MG PO TABS: 5.0000 mg | ORAL_TABLET | Freq: Every day | ORAL | Status: DC

## 2013-03-16 NOTE — Assessment & Plan Note (Signed)
Anemia in hospital. Iron studies suggestive of chronic disease. Patient has never had colonoscopy however declines, he is amenable to FOBT X3 to screen for colon cancer.

## 2013-03-16 NOTE — Assessment & Plan Note (Signed)
Recently hospitalized with acute pancreatitis and Klebsiella UTI. Symptoms resolved with Ciprofloxicin. No signs/symptoms of BPH.  -No further workup.

## 2013-03-16 NOTE — Progress Notes (Signed)
   Subjective:    Patient ID: [name], male    DOB: 27-Jul-1935, 77 y.o.   MRN: [id number]  HPI 77 year old gentleman presents for hospital followup. Patient was admitted to [facility] on 02/21/2013 with acute pancreatitis. Patient was also found to have Klebsiella that was treated with Ciprofloxicin.   Pancreatitis - no new abdominal pain, does report some occasional indigestion/burning epigastric pain, this has worsened over the past few weeks as he has stopped his Omeprazole, no emesis, tolerating diet, has occasional constipation that is relieved with otc stool softener. No diarrhea.   Alcohol Abuse - patient continues to drink 4 12 oz cans of beer per night, he is not interested in cutting down at this time, he has previously been in AA and stopped drinking for 14 months, he noticed little change in his mood/life while not drinking.   UTI - no dysuria, no abdominal pain, no fevers/chills, completed course of Ciprofloxicin, no urinary hesitancy, no nocturia, reports good flow, takes Oxybutynin for urinary urge and is well controlled at this time  Hyponatremia - patient was hyponatremic while in hospital, thought to be secondary to beer potomania  Hypokalemia - patient was hypokalemic while in the hospital  Anemia - found to be anemic in hospital, thought to be due to anemia of chronic disease, inpatient team recommended outpatient colonoscopy, patient declines colonoscopy however is open to FOBT X3. Denies blood in his stool  CAD - no chest pain recently, has not needed to use nitroglycerin.   HTN - has been out of Lisinopril and HCTZ for 4 weeks, taking Metoprolol as prescribed, no headaches, no chest pain, no vision changes     Review of Systems  Constitutional: Negative for fever, chills and fatigue.  Respiratory: Negative for cough, choking and shortness of breath.   Cardiovascular: Positive for leg swelling. Negative for chest pain.  Gastrointestinal: Positive  for constipation. Negative for nausea, vomiting, abdominal pain, diarrhea and abdominal distention.  Genitourinary: Negative for dysuria and urgency.       Objective:   Physical Exam Vitals: Reviewed Gen: pleasant Caucasian males, NAD HEENT: normocephalic, PERRL, EOMI, conjunctival pallor, nasal septum midline, MMM, no pharyngeal erythema or exudate, neck supple, no cervical adenopathy Cardiac: Irregular Irregular rhythm, S1 and S2 present, no murmurs, no heaves/thrills Resp: CTAB, normal effort Abd: soft, no tenderness, normal bowel sounds, no rebound, no guarding, no hepatomegally Ext: 2+ radial pulses bilaterally, trace pedal edema bilaterally MSK: ambulates with cain Psych: flat affect, appropriately dressed       Assessment & Plan:  Please see problem specific assessment and plan.

## 2013-03-16 NOTE — Patient Instructions (Signed)
Low sodium/potasium - check lab work today  Low blood counts/anemia - recheck blood work today and collect stool samples (3 in total and bring back to clinic to have checked for blood)  Urinary Tract Infection - appears resolved at this time  Pancreatitis - appears resolved, recommended decreasing alcohol intake as we discussed  Cholesterol - stop Simvastatin, start Lipitor 40 mg daily  Return to office in 2-4 weeks for evaluation of weakness/unsteadiness.

## 2013-03-16 NOTE — Assessment & Plan Note (Signed)
Chronic hyponatremia due to [employer].  -recheck BMP

## 2013-03-16 NOTE — Assessment & Plan Note (Signed)
Hypokalemic to 3.0 in hospital.  -recheck potassium.

## 2013-03-16 NOTE — Assessment & Plan Note (Signed)
Stable. No recent chest pain. -Will switch simvastatin to Lipitor 40 mg for secondary prevention.

## 2013-03-16 NOTE — Assessment & Plan Note (Signed)
Patient recently hospitalized for acute pancreatitis that has resolved. -decreasing alcohol intake discussed

## 2013-03-16 NOTE — Assessment & Plan Note (Signed)
Patient has hypomagnesemia in hospital. -recheck Mg today

## 2013-03-23 LAB — POC HEMOCCULT BLD/STL (HOME/3-CARD/SCREEN)

## 2013-03-23 NOTE — Addendum Note (Signed)
Addended by: [place], Jhade Berko on: 03/23/2013 04:02 PM   Modules accepted: Orders

## 2013-03-24 ENCOUNTER — Encounter: Payer: Self-pay | Admitting: Family Medicine

## 2013-04-26 ENCOUNTER — Other Ambulatory Visit: Payer: Self-pay | Admitting: Family Medicine

## 2013-04-27 ENCOUNTER — Other Ambulatory Visit (HOSPITAL_COMMUNITY): Payer: Self-pay | Admitting: Family Medicine

## 2013-04-30 ENCOUNTER — Other Ambulatory Visit: Payer: Self-pay | Admitting: Family Medicine

## 2013-05-04 ENCOUNTER — Ambulatory Visit: Payer: Self-pay | Admitting: Family Medicine

## 2013-05-11 ENCOUNTER — Ambulatory Visit: Payer: Self-pay | Admitting: Family Medicine

## 2013-05-25 ENCOUNTER — Ambulatory Visit: Payer: Self-pay | Admitting: Family Medicine

## 2013-05-28 ENCOUNTER — Other Ambulatory Visit: Payer: Self-pay | Admitting: Family Medicine

## 2013-05-31 ENCOUNTER — Encounter: Payer: Self-pay | Admitting: Family Medicine

## 2013-05-31 ENCOUNTER — Ambulatory Visit (INDEPENDENT_AMBULATORY_CARE_PROVIDER_SITE_OTHER): Payer: Medicare Other | Admitting: Family Medicine

## 2013-05-31 ENCOUNTER — Other Ambulatory Visit: Payer: Self-pay | Admitting: Family Medicine

## 2013-05-31 VITALS — BP 133/84 | HR 76 | Temp 98.7°F | Ht 72.0 in | Wt 174.0 lb

## 2013-05-31 DIAGNOSIS — D638 Anemia in other chronic diseases classified elsewhere: Secondary | ICD-10-CM

## 2013-05-31 DIAGNOSIS — F1010 Alcohol abuse, uncomplicated: Secondary | ICD-10-CM

## 2013-05-31 DIAGNOSIS — I10 Essential (primary) hypertension: Secondary | ICD-10-CM

## 2013-05-31 DIAGNOSIS — E871 Hypo-osmolality and hyponatremia: Secondary | ICD-10-CM

## 2013-05-31 DIAGNOSIS — E876 Hypokalemia: Secondary | ICD-10-CM

## 2013-05-31 LAB — CBC WITH DIFFERENTIAL/PLATELET
BASOS ABS: 0.1 10*3/uL (ref 0.0–0.1)
BASOS PCT: 1 % (ref 0–1)
EOS PCT: 1 % (ref 0–5)
Eosinophils Absolute: 0.1 10*3/uL (ref 0.0–0.7)
HEMATOCRIT: 36 % — AB (ref 39.0–52.0)
Hemoglobin: 12.4 g/dL — ABNORMAL LOW (ref 13.0–17.0)
Lymphocytes Relative: 31 % (ref 12–46)
Lymphs Abs: 1.6 10*3/uL (ref 0.7–4.0)
MCH: 30.3 pg (ref 26.0–34.0)
MCHC: 34.4 g/dL (ref 30.0–36.0)
MCV: 88 fL (ref 78.0–100.0)
Monocytes Absolute: 0.5 10*3/uL (ref 0.1–1.0)
Monocytes Relative: 10 % (ref 3–12)
Neutro Abs: 3 10*3/uL (ref 1.7–7.7)
Neutrophils Relative %: 57 % (ref 43–77)
Platelets: 179 10*3/uL (ref 150–400)
RBC: 4.09 MIL/uL — ABNORMAL LOW (ref 4.22–5.81)
RDW: 13.5 % (ref 11.5–15.5)
WBC: 5.2 10*3/uL (ref 4.0–10.5)

## 2013-05-31 LAB — BASIC METABOLIC PANEL
BUN: 18 mg/dL (ref 6–23)
CO2: 26 meq/L (ref 19–32)
Calcium: 9.7 mg/dL (ref 8.4–10.5)
Chloride: 89 mEq/L — ABNORMAL LOW (ref 96–112)
Creat: 0.87 mg/dL (ref 0.50–1.35)
GLUCOSE: 87 mg/dL (ref 70–99)
POTASSIUM: 3.6 meq/L (ref 3.5–5.3)
SODIUM: 126 meq/L — AB (ref 135–145)

## 2013-05-31 LAB — IRON AND TIBC
%SAT: 10 % — ABNORMAL LOW (ref 20–55)
Iron: 45 ug/dL (ref 42–165)
TIBC: 459 ug/dL — ABNORMAL HIGH (ref 215–435)
UIBC: 414 ug/dL — ABNORMAL HIGH (ref 125–400)

## 2013-05-31 LAB — MAGNESIUM: Magnesium: 1.6 mg/dL (ref 1.5–2.5)

## 2013-05-31 LAB — FERRITIN: Ferritin: 16 ng/mL — ABNORMAL LOW (ref 22–322)

## 2013-05-31 MED ORDER — NITROGLYCERIN 0.4 MG SL SUBL: 0.4000 mg | SUBLINGUAL_TABLET | SUBLINGUAL | Status: DC | PRN

## 2013-05-31 NOTE — Assessment & Plan Note (Signed)
Patient due for recheck of potassium. BMP ordered.

## 2013-05-31 NOTE — Assessment & Plan Note (Signed)
Hypertension is well controlled today on current pharmacotherapy.

## 2013-05-31 NOTE — Assessment & Plan Note (Signed)
Patient is due for recheck of hemoglobin. Iron, TIBC, and ferritin level drawn today as patient is currently part of anemia and congestive heart failure study.

## 2013-05-31 NOTE — Assessment & Plan Note (Signed)
Patient due for recheck a magnesium level. Blood work obtained today.

## 2013-05-31 NOTE — Progress Notes (Signed)
   Subjective:    Patient ID: [name], male    DOB: 1935-11-22, 78 y.o.   MRN: [id number]  HPI 78 year old male presents for followup of multiple medical conditions.  Hypertension-patient currently on HCTZ, lisinopril, and metoprolol. Patient reports medication compliance. Denies side effects. No current chest pain or headaches. He has noted some mild decrease in his vision however states he has not seen his ophthalmologist in some time.  Hyponatremia/Hyopokalemia/Hypomagnesemia - patient was hospitalized in October 2014 identified to have multiple electrolyte abnormalities thought to be secondary to chronic alcohol abuse, patient had been counseled to return for lab visit to have repeat lab work including BMP and magnesium level for which were never obtained, patient currently denies muscle cramps  Anemia-patient has history of anemia thought to be secondary to chronic disease as well as alcohol abuse, he is not currently on iron supplementation, denies lightheadedness, orthostasis, or syncope.  Alcohol abuse-patient continues to drink 3-4 12 ounce beers on a nightly basis, he has previously attended [employer], he states that he has not gone to a meeting in over a year as his becoming more difficult for him to travel at night due to worsening vision changes  Social-patient lives alone   Review of Systems  Constitutional: Negative for fever, chills and fatigue.  HENT: Negative for congestion, postnasal drip, rhinorrhea and sinus pressure.   Respiratory: Negative for cough and shortness of breath.   Cardiovascular: Negative for chest pain and leg swelling.  Gastrointestinal: Negative for nausea, vomiting, diarrhea and constipation.       Objective:   Physical Exam Vitals: Reviewed General: Pleasant Caucasian male, no acute distress, uses cane for ambulation HEENT: Normocephalic, pupils are equal round and reactive to light, extraocular movements are intact, no scleral icterus,  moist mucous membranes, no pharyngeal erythema or exudate noted, neck was supple, no anterior or posterior cervical lymphadenopathy Cardiac: Regular rate and rhythm, S1 and S2 present, no murmurs, no heaves or thrills Respiratory: Clear to patient bilaterally, normal effort Extremities: Trace bilateral pedal edema Skin: No rash       Assessment & Plan:  Please see problem specific assessment and plan.

## 2013-05-31 NOTE — Assessment & Plan Note (Addendum)
Patient continues to drink on a daily basis. He states that he is attempting to cut down. He last attended AA over one year ago. -Patient was counseled on the importance of abstinence from alcohol. Patient was counseled to contact his physician prior to discontinuing all alcohol due to the risk of withdrawal.

## 2013-05-31 NOTE — Assessment & Plan Note (Signed)
Patient due for recheck of basic metabolic panel. Hyponatremia secondary to beer potomania and CHF.

## 2013-05-31 NOTE — Patient Instructions (Signed)
Blood Pressure - well controlled, have prescriptions refilled at pharmacy, call if you need additional refills  Check lab work to evaluate your magnesium, sodium, potassium, and iron levels.

## 2013-06-01 ENCOUNTER — Telehealth: Payer: Self-pay | Admitting: Family Medicine

## 2013-06-01 MED ORDER — OMEPRAZOLE 20 MG PO CPDR: 20.0000 mg | DELAYED_RELEASE_CAPSULE | Freq: Every day | ORAL | Status: DC

## 2013-06-01 MED ORDER — LISINOPRIL 5 MG PO TABS: 5.0000 mg | ORAL_TABLET | Freq: Every day | ORAL | Status: DC

## 2013-06-01 MED ORDER — TRAMADOL HCL 50 MG PO TABS: 50.0000 mg | ORAL_TABLET | Freq: Three times a day (TID) | ORAL | Status: DC | PRN

## 2013-06-01 NOTE — Telephone Encounter (Signed)
Spoke to Patient - out of Omeprazole, Tramadol, Lipitor, Lisinopril. He currently has Metoprolol, Nitroglycerin (refilled yesterday), ASA 81, Oxybutinyn, and HCTZ.  Spoke to Pharmacy - patient picked up 90 day supply of multiple meds including Lipitor and Lisinopril less than 3 months ago (too early for refills), gave verbal refill for Omeprazole and Tramadol  Called Patient back - informed him of above, patient does not recall having Lipitor and Lisinopril. Told patient that Lisinopril is on $4 list and I will send in prescription, as Lipitor is expensive will wait until refill come due in 2 weeks (patient to call pharmacy for refill at that time).

## 2013-06-01 NOTE — Telephone Encounter (Signed)
Pt called and is concerning about the medication that was called in on 03/02. I only seen one medication called on 03/02 but he feels that a lot was called in and doesn't know why he is still taking Nitroglycerin. Please call and let him know what is going on. jw

## 2013-06-01 NOTE — Telephone Encounter (Signed)
Will fwd to PCP for review. .Annaleigha Woo  

## 2013-06-01 NOTE — Telephone Encounter (Signed)
Pt called back. He does have the Lisinopril

## 2013-06-02 ENCOUNTER — Telehealth: Payer: Self-pay | Admitting: Family Medicine

## 2013-06-02 DIAGNOSIS — D509 Iron deficiency anemia, unspecified: Secondary | ICD-10-CM

## 2013-06-02 HISTORY — DX: Iron deficiency anemia, unspecified: D50.9

## 2013-06-02 MED ORDER — FERROUS SULFATE 325 (65 FE) MG PO TABS: 325.0000 mg | ORAL_TABLET | Freq: Every day | ORAL | Status: DC

## 2013-06-02 NOTE — Telephone Encounter (Signed)
Made patient aware of low iron/ferritin. Will start on Iron supplementation.

## 2013-07-21 ENCOUNTER — Encounter (HOSPITAL_COMMUNITY): Payer: Self-pay | Admitting: Emergency Medicine

## 2013-07-21 ENCOUNTER — Emergency Department (HOSPITAL_COMMUNITY)
Admission: EM | Admit: 2013-07-21 | Discharge: 2013-07-21 | Disposition: A | Discharge status: Home / Self Care | Payer: Medicare Other | Attending: Emergency Medicine | Admitting: Emergency Medicine

## 2013-07-21 ENCOUNTER — Emergency Department (HOSPITAL_COMMUNITY): Payer: Medicare Other

## 2013-07-21 DIAGNOSIS — F1021 Alcohol dependence, in remission: Secondary | ICD-10-CM | POA: Insufficient documentation

## 2013-07-21 DIAGNOSIS — W1809XA Striking against other object with subsequent fall, initial encounter: Secondary | ICD-10-CM | POA: Insufficient documentation

## 2013-07-21 DIAGNOSIS — Z7982 Long term (current) use of aspirin: Secondary | ICD-10-CM | POA: Insufficient documentation

## 2013-07-21 DIAGNOSIS — I252 Old myocardial infarction: Secondary | ICD-10-CM | POA: Insufficient documentation

## 2013-07-21 DIAGNOSIS — I2510 Atherosclerotic heart disease of native coronary artery without angina pectoris: Secondary | ICD-10-CM | POA: Insufficient documentation

## 2013-07-21 DIAGNOSIS — Y9301 Activity, walking, marching and hiking: Secondary | ICD-10-CM | POA: Insufficient documentation

## 2013-07-21 DIAGNOSIS — D649 Anemia, unspecified: Secondary | ICD-10-CM | POA: Insufficient documentation

## 2013-07-21 DIAGNOSIS — Z79899 Other long term (current) drug therapy: Secondary | ICD-10-CM | POA: Insufficient documentation

## 2013-07-21 DIAGNOSIS — I10 Essential (primary) hypertension: Secondary | ICD-10-CM | POA: Insufficient documentation

## 2013-07-21 DIAGNOSIS — S0101XA Laceration without foreign body of scalp, initial encounter: Secondary | ICD-10-CM

## 2013-07-21 DIAGNOSIS — W19XXXA Unspecified fall, initial encounter: Secondary | ICD-10-CM

## 2013-07-21 DIAGNOSIS — Y92009 Unspecified place in unspecified non-institutional (private) residence as the place of occurrence of the external cause: Secondary | ICD-10-CM | POA: Insufficient documentation

## 2013-07-21 DIAGNOSIS — I4891 Unspecified atrial fibrillation: Secondary | ICD-10-CM | POA: Insufficient documentation

## 2013-07-21 DIAGNOSIS — Z951 Presence of aortocoronary bypass graft: Secondary | ICD-10-CM | POA: Insufficient documentation

## 2013-07-21 DIAGNOSIS — Z8546 Personal history of malignant neoplasm of prostate: Secondary | ICD-10-CM | POA: Insufficient documentation

## 2013-07-21 DIAGNOSIS — Z8719 Personal history of other diseases of the digestive system: Secondary | ICD-10-CM | POA: Insufficient documentation

## 2013-07-21 DIAGNOSIS — Z23 Encounter for immunization: Secondary | ICD-10-CM | POA: Insufficient documentation

## 2013-07-21 DIAGNOSIS — S0100XA Unspecified open wound of scalp, initial encounter: Secondary | ICD-10-CM | POA: Insufficient documentation

## 2013-07-21 DIAGNOSIS — M479 Spondylosis, unspecified: Secondary | ICD-10-CM | POA: Insufficient documentation

## 2013-07-21 DIAGNOSIS — Z8669 Personal history of other diseases of the nervous system and sense organs: Secondary | ICD-10-CM | POA: Insufficient documentation

## 2013-07-21 DIAGNOSIS — Z87891 Personal history of nicotine dependence: Secondary | ICD-10-CM | POA: Insufficient documentation

## 2013-07-21 MED ORDER — TETANUS-DIPHTH-ACELL PERTUSSIS 5-2.5-18.5 LF-MCG/0.5 IM SUSP
0.5000 mL | Freq: Once | INTRAMUSCULAR | Status: AC
  Administered 2013-07-21: 0.5 mL via INTRAMUSCULAR
  Filled 2013-07-21: qty 0.5

## 2013-07-21 NOTE — Discharge Instructions (Signed)
Take tylenol or ultram at home for pain.  Follow up with your doctor in 1 week for staple removal.  Return to ER if you notice signs of infection including increasing pain, pus drainage, worsening redness or if you have any concerns.   Fall Prevention and Home Safety Falls cause injuries and can affect all age groups. It is possible to prevent falls.  HOW TO PREVENT FALLS  Wear shoes with rubber soles that do not have an opening for your toes.  Keep the inside and outside of your house well lit.  Use night lights throughout your home.  Remove clutter from floors.  Clean up floor spills.  Remove throw rugs or fasten them to the floor with carpet tape.  Do not place electrical cords across pathways.  Put grab bars by your tub, shower, and toilet. Do not use towel bars as grab bars.  Put handrails on both sides of the stairway. Fix loose handrails.  Do not climb on stools or stepladders, if possible.  Do not wax your floors.  Repair uneven or unsafe sidewalks, walkways, or stairs.  Keep items you use a lot within reach.  Be aware of pets.  Keep emergency numbers next to the telephone.  Put smoke detectors in your home and near bedrooms. Ask your doctor what other things you can do to prevent falls. Document Released: 01/12/2009 Document Revised: 09/17/2011 Document Reviewed: 06/18/2011 [facility] Patient Information 2014 [place], [place].  Staple Wound Closure Staples are used to help a wound heal faster by holding the edges of the wound together. HOME CARE  Keep the area around the staples clean and dry.  Rest and raise (elevate) the injured part above the level of your heart.  See your doctor for a follow-up check of the wound.  See your doctor to have the staples removed.  Clean the wound daily with water.  Do not soak the wound in water for long periods of time.  Let air reach the wound as it heals. GET HELP RIGHT AWAY IF:   You have redness or puffiness  around the wound.  You have a red line going away from the wound.  You have more pain or tenderness.  You have yellowish-white fluid (pus) coming from the wound.  Your wound does not stay together after the staples have been taken out.  You see something coming out of the wound, such as wood or glass.  You have problems moving the injured area.  You have a fever or lasting symptoms for more than 2-3 days.  You have a fever and your symptoms suddenly get worse. MAKE SURE YOU:   Understand these instructions.  Will watch this condition.  Will get help right away if you are not doing well or get worse. Document Released: 12/26/2007 Document Revised: 12/11/2011 Document Reviewed: 09/29/2011 [facility] Patient Information 2014 [place].

## 2013-07-21 NOTE — ED Notes (Signed)
Pt was walking and tripped falling backwards and striking head on a curb.  - LOC, denies any other pain.  Approximate 1" lac to back of head, bleeding controlled.
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
## 2013-07-21 NOTE — ED Provider Notes (Signed)
Medical screening examination/treatment/procedure(s) were performed by non-physician practitioner and as supervising physician I was immediately available for consultation/collaboration.   EKG Interpretation None        Alfonzo Feller, DO 07/21/13 1918

## 2013-07-30 ENCOUNTER — Encounter: Payer: Self-pay | Admitting: Family Medicine

## 2013-07-30 ENCOUNTER — Ambulatory Visit (HOSPITAL_COMMUNITY)
Admission: RE | Admit: 2013-07-30 | Discharge: 2013-07-30 | Disposition: A | Discharge status: Home / Self Care | Payer: Medicare Other | Source: Ambulatory Visit | Attending: Family Medicine | Admitting: Family Medicine

## 2013-07-30 ENCOUNTER — Ambulatory Visit (INDEPENDENT_AMBULATORY_CARE_PROVIDER_SITE_OTHER): Payer: Medicare Other | Admitting: Family Medicine

## 2013-07-30 VITALS — BP 127/71 | HR 100 | Ht 72.0 in | Wt 169.0 lb

## 2013-07-30 DIAGNOSIS — R2681 Unsteadiness on feet: Secondary | ICD-10-CM

## 2013-07-30 DIAGNOSIS — I4891 Unspecified atrial fibrillation: Secondary | ICD-10-CM

## 2013-07-30 DIAGNOSIS — T148XXA Other injury of unspecified body region, initial encounter: Secondary | ICD-10-CM

## 2013-07-30 DIAGNOSIS — R269 Unspecified abnormalities of gait and mobility: Secondary | ICD-10-CM | POA: Insufficient documentation

## 2013-07-30 DIAGNOSIS — R55 Syncope and collapse: Secondary | ICD-10-CM

## 2013-07-30 DIAGNOSIS — IMO0002 Reserved for concepts with insufficient information to code with codable children: Secondary | ICD-10-CM

## 2013-07-30 HISTORY — DX: Unsteadiness on feet: R26.81

## 2013-07-30 HISTORY — DX: Syncope and collapse: R55

## 2013-07-30 LAB — TSH: TSH: 2.339 u[IU]/mL (ref 0.350–4.500)

## 2013-07-30 LAB — CBC
HCT: 31.7 % — ABNORMAL LOW (ref 39.0–52.0)
Hemoglobin: 11 g/dL — ABNORMAL LOW (ref 13.0–17.0)
MCH: 31.2 pg (ref 26.0–34.0)
MCHC: 34.7 g/dL (ref 30.0–36.0)
MCV: 89.8 fL (ref 78.0–100.0)
PLATELETS: 169 10*3/uL (ref 150–400)
RBC: 3.53 MIL/uL — AB (ref 4.22–5.81)
RDW: 15.2 % (ref 11.5–15.5)
WBC: 4.7 10*3/uL (ref 4.0–10.5)

## 2013-07-30 LAB — CMP AND LIVER
ALT: 10 U/L (ref 0–53)
AST: 19 U/L (ref 0–37)
Albumin: 4 g/dL (ref 3.5–5.2)
Alkaline Phosphatase: 61 U/L (ref 39–117)
BILIRUBIN DIRECT: 0.4 mg/dL — AB (ref 0.0–0.3)
BILIRUBIN TOTAL: 1.7 mg/dL — AB (ref 0.2–1.2)
BUN: 16 mg/dL (ref 6–23)
CO2: 30 meq/L (ref 19–32)
CREATININE: 0.97 mg/dL (ref 0.50–1.35)
Calcium: 9.4 mg/dL (ref 8.4–10.5)
Chloride: 90 mEq/L — ABNORMAL LOW (ref 96–112)
Glucose, Bld: 93 mg/dL (ref 70–99)
Indirect Bilirubin: 1.3 mg/dL — ABNORMAL HIGH (ref 0.2–1.2)
Potassium: 3.7 mEq/L (ref 3.5–5.3)
Sodium: 127 mEq/L — ABNORMAL LOW (ref 135–145)
Total Protein: 6.7 g/dL (ref 6.0–8.3)

## 2013-07-30 LAB — FOLATE: FOLATE: 12.4 ng/mL

## 2013-07-30 LAB — VITAMIN B12: Vitamin B-12: 512 pg/mL (ref 211–911)

## 2013-07-30 NOTE — Patient Instructions (Signed)
Head laceration - healing well, staples taken out today, clean daily  Lightheadedness/?syncope/passing out - check lab work, referral has been made to cardiology  Unsteadiness on feet/Ataxia - check lab work, may be due to small strokes in the cerebellum vs history of alcohol use  Return to office in 1-2 weeks

## 2013-08-02 ENCOUNTER — Encounter: Payer: Self-pay | Admitting: Family Medicine

## 2013-08-02 DIAGNOSIS — IMO0002 Reserved for concepts with insufficient information to code with codable children: Secondary | ICD-10-CM | POA: Insufficient documentation

## 2013-08-02 HISTORY — DX: Reserved for concepts with insufficient information to code with codable children: IMO0002

## 2013-08-02 NOTE — Assessment & Plan Note (Signed)
Laceration well healed. Staples removed.

## 2013-08-02 NOTE — Assessment & Plan Note (Addendum)
Patient reports increased unsteadiness on feet. Neurologic exam today positive for unsteady gait/positive rhomberg/intention tremor. Suspect that symptoms are multifactorial from longstanding alcohol use and prior multifocal infarcts of cerebellum (shown on CT head performed 07/21/13). He is currently on ASA and Lipitor for secondary stroke prevention.  -check B12/Folate/TSH/CBC/CMP -Patient to return to office in 1-2 weeks after lab work and syncopal workup is completed, consider MRI brain/referral to neurology at that time. - Medications may also be contributing to symptoms, specifically oxybutynin, no changes to therapy made today however will likely need to wean this medication -Patient told to continue daily multivitamin and stressed the importance of minimizing alcohol intake, given longstanding history of alcohol use patient would likely need to be admitted to the hospital if he wished to stop drinking

## 2013-08-02 NOTE — Progress Notes (Signed)
   Subjective:    Patient ID: [name], male    DOB: Mar 17, 1936, 78 y.o.   MRN: [id number]  HPI 78 y/o male presents for ER follow up from 07/21/13. Per the ED notes the patient sustained a mechanical fall. He had CT head and C-spine performed as outlined below. He has 3 sutures placed in the left occiput to close a scalp laceration. Patient is here today for laceration repair.  The patient states that he was at a gas station filling his car, he became lightheaded and the next thing he remembers is waking up on the ground, he is unclear if he had associated chest pain or shortness of breath, no seizure activity, no loss of bladder or bowel incontinence, per the Niece that is present this is the first time that she has heard this version of the story, she states that up until this point the story had gone that he tripped over the curb at the gas station which caused him to fall and hit his head.  The patient reports worsening unsteadiness of his feet, this has been worsening over the past months to years, he uses a cane to help with ambulation, he has had intermittent falls that he reports tripping/falling, he reports no other syncopal episodes, no recent chest pain/shortness of breath, at home the patient needs to use the walls/counters of his home to help with balance, denies dizziness/vertiogo  Social: patient lives alone, is an alcoholic, drinks 3-4 12 oz. Beers per night, does not currently attend AA  Review of Systems  Constitutional: Negative for fever, chills and fatigue.  Respiratory: Negative for cough, chest tightness and shortness of breath.   Cardiovascular: Negative for chest pain and leg swelling.  Neurological: Positive for syncope and weakness. Negative for tremors, seizures and numbness.       Objective:   Physical Exam Vitals: orthostatics (laying - BP136/71, HR 83; sitting - BP 126/74, HR 82; standing - BP 127/71, HR 100) Gen: pleasant male, accompanied by niece,  NAD HEENT: scalp laceration as noted below, PERRL, EOMI, bilateral TM pearly grey, nasal septum midline, no rhinorrhea, MMM, no pharyngeal erythema or exudate, neck supple, no anterior or posterior cervical lymphadenopathy Cardiac: irregular irregular rhythm, no murmurs, no heaves/thrills Resp: CTAB, normal effort Abd: soft, no tenderness, normal bowel sounds MSK: walks with cane, gait is slow with a wide base Neuro: Alert and oriented X3, CN2-12 intact, strength 4+/5 in all extremities, sensation to light touch intact in all extremities, intention tremor present with finger to nose testing, patient becomes unsteady with rhomberg testing, pronator drift negative Skin: laceration well healed over left occiput, 3 staples present  Staples removed today without complication  EKG - atrial fibrillation, rate 77, left bundle branch present, unchanged from previous ekg  CT Head (07/21/13): no acute intracranial process, prior multifocal infarcts present in the cerebellum, atrophy and small vessel diseas CT C-spine (07/21/13):extensive spondylosis and arthritis, slight spondylolisthesis present at C4-C5, bilateral carotic arteries calcified.       Assessment & Plan:  Please see problem specific assessment and plan.

## 2013-08-02 NOTE — Assessment & Plan Note (Signed)
Patient had syncopal episode on 07/21/13 which lead to head laceration. The exact etiology of the syncopal episode is unclear. He does have a history of Atrial Fibrillation, EKG done today which shows rate controlled atrial fibrillation and LBBB unchanged from previously, orthostatics were negative today.  -Will repeat 2D echocardiogram to evaluate heart function -No current chest pain therefore will not pursue ACS workup at this time however patient will be referred to cardiology for possible Holter monitor to evaluate for other arrythmias that may have contributed to the syncopal episode -As patient is an alcoholic and has a history of anemia will check lab work including CMP/CBC/B12/Folate/TSH -Patient to return in one week for follow up.

## 2013-08-05 ENCOUNTER — Other Ambulatory Visit: Payer: Self-pay | Admitting: Family Medicine

## 2013-08-13 ENCOUNTER — Ambulatory Visit: Payer: Self-pay | Admitting: Family Medicine

## 2013-09-06 ENCOUNTER — Encounter: Payer: Self-pay | Admitting: Family Medicine

## 2013-09-06 ENCOUNTER — Ambulatory Visit (INDEPENDENT_AMBULATORY_CARE_PROVIDER_SITE_OTHER): Payer: Medicare Other | Admitting: Family Medicine

## 2013-09-06 VITALS — BP 120/68 | HR 94 | Temp 97.7°F | Ht 72.0 in | Wt 177.0 lb

## 2013-09-06 DIAGNOSIS — R269 Unspecified abnormalities of gait and mobility: Secondary | ICD-10-CM

## 2013-09-06 DIAGNOSIS — D509 Iron deficiency anemia, unspecified: Secondary | ICD-10-CM

## 2013-09-06 DIAGNOSIS — R55 Syncope and collapse: Secondary | ICD-10-CM

## 2013-09-06 DIAGNOSIS — I10 Essential (primary) hypertension: Secondary | ICD-10-CM

## 2013-09-06 DIAGNOSIS — F1020 Alcohol dependence, uncomplicated: Secondary | ICD-10-CM

## 2013-09-06 DIAGNOSIS — R2681 Unsteadiness on feet: Secondary | ICD-10-CM

## 2013-09-06 DIAGNOSIS — I2510 Atherosclerotic heart disease of native coronary artery without angina pectoris: Secondary | ICD-10-CM

## 2013-09-06 MED ORDER — HYDROCHLOROTHIAZIDE 25 MG PO TABS: 25.0000 mg | ORAL_TABLET | Freq: Every day | ORAL | Status: DC

## 2013-09-06 MED ORDER — ASPIRIN EC 81 MG PO TBEC: 81.0000 mg | DELAYED_RELEASE_TABLET | Freq: Every day | ORAL | Status: AC

## 2013-09-06 MED ORDER — FERROUS SULFATE 325 (65 FE) MG PO TABS: 325.0000 mg | ORAL_TABLET | Freq: Every day | ORAL | Status: DC

## 2013-09-06 MED ORDER — TRAMADOL HCL 50 MG PO TABS
50.0000 mg | ORAL_TABLET | Freq: Three times a day (TID) | ORAL | Status: DC | PRN
Start: 2013-09-06 — End: 2014-04-09
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
## 2013-09-06 NOTE — Assessment & Plan Note (Signed)
No recent episodes. Lab work from last visit did not identify cause. Patient has not had Echocardiogram or Cardiology follow up.  -nursing staff to schedule Echocardiogram and contact patient with appointment time -Cardiology office notified and will contact patient to schedule

## 2013-09-06 NOTE — Assessment & Plan Note (Signed)
Likely multifactorial from Alcohol/possible cerebellar CVA/Medications -counseled on cutting down on alcohol -stopped Oxybutynin today -continue secondary stroke prevention with ASA 81 and Lipitor (patient is poor candidate for full anticoagulation given fall risk).  -Since cardiac workup has not been completed will hold on further Neuro workup at this time.

## 2013-09-06 NOTE — Progress Notes (Signed)
   Subjective:    Patient ID: [name], male    DOB: 06/02/1935, 78 y.o.   MRN: [id number]  HPI 78 y/o male presents for routine follow up. Needs refills on multiple medications.  Fall/Syncopal episode - patient evaluated on 07/30/13 after a fall vs syncopal episode that cause a scalp laceration, patient sent for Echo and Cardiology follow up however neither of these have occurred, patient now states that he fell due to unsteady gait, has had increasing difficulty with ambulation, requires cain to ambulate, he reports bilateral rib/epigastric pain a few days ago and took nitroglycerin at that time, he was at rest when symptoms started, no radiation, no associated sob, symptoms resolved after an hour, no current chest pain or sob, no further syncope/falls  Alcoholism - patient currently drinking 8-12 12 oz beers per day, not interested in quitting, he still drives, states that he only drives in the morning before he starts drinking  Patient on oxybutiyn, he is unclear for what reason, denies urinary incontinence at this time   Review of Systems  Constitutional: Negative for fever, chills and fatigue.  Respiratory: Negative for chest tightness and shortness of breath.   Cardiovascular: Negative for chest pain.  Neurological: Positive for weakness. Negative for dizziness, speech difficulty and headaches.       Objective:   Physical Exam Vitals: reviewed Gen: pleasant male, NAD HEENT: normocephalic, PERRL, EOMI, MMM, neck supple Cardiac: Irregular Irregular, S1 and S2 present, no murmurs, no heaves/thrills Resp: CTAB, normal effort Neuro: proximal LE weakness observed as patient took multiple attempts to stand from chair, wide based gait  Reviewed lab work from 07/30/13: normal TSH/folate/B12. BMP - Na 127, K+ 3.7, Cl 90, Bicarb 30, BUN 16, CR 0.97, glucose 93, TB 1.7, IB 1.3, other LFT wnl, CBC wnl except Hemoglobin 11.0 and HCT 31.7     Assessment & Plan:  Please see problem  specific assessment and plan.
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
## 2013-09-20 ENCOUNTER — Telehealth: Payer: Self-pay | Admitting: Family Medicine

## 2013-09-20 NOTE — Telephone Encounter (Signed)
Lacrese from [facility] in [place] called after receiving letter written by Dr. Ree Kida on patient's behalf. On 09/13/13, letter accidentally was created and printed under patient's son chart MRN [id number] (he is not a patient at [facility], but they have the same name). Lacrese states that a new letter must be submitted to [facility] stating the error and that the letter was not intended to be for patient's son but for patient. Please correct immediately or son will have to perform this driver test to keep his license. Please fax letter to [phone] ATTN: Medical Review Unit.

## 2013-09-21 ENCOUNTER — Encounter: Payer: Self-pay | Admitting: Family Medicine

## 2013-09-21 NOTE — Telephone Encounter (Signed)
Created retraction letter for MRN [id number]. New letter created for [name]. Both letter were given to [facility] to fax to the [facility].

## 2013-09-23 ENCOUNTER — Ambulatory Visit (HOSPITAL_COMMUNITY): Payer: Medicare Other | Attending: Family Medicine

## 2013-11-01 ENCOUNTER — Encounter: Payer: Self-pay | Admitting: *Deleted

## 2013-11-09 ENCOUNTER — Institutional Professional Consult (permissible substitution): Payer: Self-pay | Admitting: Cardiology

## 2013-11-16 ENCOUNTER — Encounter: Payer: Self-pay | Admitting: Cardiology

## 2013-12-15 ENCOUNTER — Other Ambulatory Visit: Payer: Self-pay | Admitting: Family Medicine

## 2014-01-09 ENCOUNTER — Other Ambulatory Visit (HOSPITAL_COMMUNITY): Payer: Self-pay | Admitting: Family Medicine

## 2014-03-31 ENCOUNTER — Other Ambulatory Visit: Payer: Self-pay | Admitting: Family Medicine

## 2014-04-04 NOTE — Telephone Encounter (Signed)
Note to nursing staff - please schedule follow up for unsteady gait/hypertension

## 2014-04-06 ENCOUNTER — Emergency Department (HOSPITAL_COMMUNITY): Payer: Medicare Other

## 2014-04-06 ENCOUNTER — Inpatient Hospital Stay (HOSPITAL_COMMUNITY)
Admission: EM | Admit: 2014-04-06 | Discharge: 2014-04-09 | DRG: 287 | Disposition: A | Discharge status: Home Health | Payer: Medicare Other | Attending: Family Medicine | Admitting: Family Medicine

## 2014-04-06 ENCOUNTER — Other Ambulatory Visit: Payer: Self-pay

## 2014-04-06 DIAGNOSIS — F329 Major depressive disorder, single episode, unspecified: Secondary | ICD-10-CM | POA: Diagnosis not present

## 2014-04-06 DIAGNOSIS — M545 Low back pain, unspecified: Secondary | ICD-10-CM

## 2014-04-06 DIAGNOSIS — F1010 Alcohol abuse, uncomplicated: Secondary | ICD-10-CM | POA: Diagnosis not present

## 2014-04-06 DIAGNOSIS — Z9181 History of falling: Secondary | ICD-10-CM | POA: Diagnosis not present

## 2014-04-06 DIAGNOSIS — I447 Left bundle-branch block, unspecified: Secondary | ICD-10-CM | POA: Diagnosis not present

## 2014-04-06 DIAGNOSIS — Z7982 Long term (current) use of aspirin: Secondary | ICD-10-CM

## 2014-04-06 DIAGNOSIS — K219 Gastro-esophageal reflux disease without esophagitis: Secondary | ICD-10-CM | POA: Diagnosis present

## 2014-04-06 DIAGNOSIS — I429 Cardiomyopathy, unspecified: Secondary | ICD-10-CM | POA: Diagnosis present

## 2014-04-06 DIAGNOSIS — I200 Unstable angina: Secondary | ICD-10-CM | POA: Diagnosis present

## 2014-04-06 DIAGNOSIS — C7982 Secondary malignant neoplasm of genital organs: Secondary | ICD-10-CM | POA: Diagnosis not present

## 2014-04-06 DIAGNOSIS — Z87891 Personal history of nicotine dependence: Secondary | ICD-10-CM

## 2014-04-06 DIAGNOSIS — R079 Chest pain, unspecified: Secondary | ICD-10-CM | POA: Diagnosis present

## 2014-04-06 DIAGNOSIS — J9811 Atelectasis: Secondary | ICD-10-CM | POA: Diagnosis not present

## 2014-04-06 DIAGNOSIS — R072 Precordial pain: Secondary | ICD-10-CM | POA: Diagnosis not present

## 2014-04-06 DIAGNOSIS — I4891 Unspecified atrial fibrillation: Secondary | ICD-10-CM | POA: Diagnosis present

## 2014-04-06 DIAGNOSIS — D61818 Other pancytopenia: Secondary | ICD-10-CM | POA: Diagnosis not present

## 2014-04-06 DIAGNOSIS — I25110 Atherosclerotic heart disease of native coronary artery with unstable angina pectoris: Secondary | ICD-10-CM | POA: Diagnosis not present

## 2014-04-06 DIAGNOSIS — E871 Hypo-osmolality and hyponatremia: Secondary | ICD-10-CM | POA: Diagnosis not present

## 2014-04-06 DIAGNOSIS — I252 Old myocardial infarction: Secondary | ICD-10-CM

## 2014-04-06 DIAGNOSIS — I10 Essential (primary) hypertension: Secondary | ICD-10-CM | POA: Diagnosis present

## 2014-04-06 DIAGNOSIS — R0789 Other chest pain: Secondary | ICD-10-CM | POA: Diagnosis not present

## 2014-04-06 DIAGNOSIS — Z951 Presence of aortocoronary bypass graft: Secondary | ICD-10-CM

## 2014-04-06 HISTORY — DX: Major depressive disorder, single episode, unspecified: F32.9

## 2014-04-06 HISTORY — DX: Gastro-esophageal reflux disease without esophagitis: K21.9

## 2014-04-06 HISTORY — DX: Low back pain: M54.5

## 2014-04-06 HISTORY — DX: Low back pain, unspecified: M54.50

## 2014-04-06 HISTORY — DX: Other chronic pain: G89.29

## 2014-04-06 HISTORY — DX: Depression, unspecified: F32.A

## 2014-04-06 HISTORY — DX: Pure hypercholesterolemia, unspecified: E78.00

## 2014-04-06 LAB — COMPREHENSIVE METABOLIC PANEL
ALBUMIN: 3.1 g/dL — AB (ref 3.5–5.2)
ALK PHOS: 89 U/L (ref 39–117)
ALT: 9 U/L (ref 0–53)
AST: 22 U/L (ref 0–37)
Anion gap: 15 (ref 5–15)
BUN: 14 mg/dL (ref 6–23)
CO2: 22 mmol/L (ref 19–32)
Calcium: 8.8 mg/dL (ref 8.4–10.5)
Chloride: 95 mEq/L — ABNORMAL LOW (ref 96–112)
Creatinine, Ser: 1.01 mg/dL (ref 0.50–1.35)
GFR calc Af Amer: 81 mL/min — ABNORMAL LOW (ref >90)
GFR, EST NON AFRICAN AMERICAN: 70 mL/min — AB (ref >90)
Glucose, Bld: 58 mg/dL — ABNORMAL LOW (ref 70–99)
POTASSIUM: 3.8 mmol/L (ref 3.5–5.1)
Sodium: 132 mmol/L — ABNORMAL LOW (ref 135–145)
Total Bilirubin: 1.3 mg/dL — ABNORMAL HIGH (ref 0.3–1.2)
Total Protein: 5.5 g/dL — ABNORMAL LOW (ref 6.0–8.3)

## 2014-04-06 LAB — BRAIN NATRIURETIC PEPTIDE: B Natriuretic Peptide: 206 pg/mL — ABNORMAL HIGH (ref 0.0–100.0)

## 2014-04-06 LAB — CBC
HCT: 31.5 % — ABNORMAL LOW (ref 39.0–52.0)
Hemoglobin: 11.3 g/dL — ABNORMAL LOW (ref 13.0–17.0)
MCH: 35.5 pg — AB (ref 26.0–34.0)
MCHC: 35.9 g/dL (ref 30.0–36.0)
MCV: 99.1 fL (ref 78.0–100.0)
Platelets: 217 10*3/uL (ref 150–400)
RBC: 3.18 MIL/uL — ABNORMAL LOW (ref 4.22–5.81)
RDW: 13.3 % (ref 11.5–15.5)
WBC: 5.4 10*3/uL (ref 4.0–10.5)

## 2014-04-06 LAB — I-STAT TROPONIN, ED: Troponin i, poc: 0.01 ng/mL (ref 0.00–0.08)

## 2014-04-06 LAB — LIPASE, BLOOD: Lipase: 46 U/L (ref 11–59)
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
## 2014-04-06 NOTE — ED Notes (Signed)
Per EMS, the patient went to lay down last night and experienced chest pressure that radiated to back. He was concerned because he previously had open heart surgery with back pain as presenting symptom of pain. Wife reported to ems that patient took 2 nitro prior to their arrival, he also received 324mg  of aspirin. cbg is 89, bp 94/50, p 50 with irregular rhythm. ETOH consumption tonight. IV 20g present in left A/C, approximately 175mL of normal saline infused. Patient is a poor historian.

## 2014-04-06 NOTE — ED Notes (Signed)
Pt from home for eval of chest pain that started today, pt states recent fall when "my legs gave out" x3 days ago and hurt his back. Pt states that tonight he started having substernal chest pressure and congestion and believes the back pain has caused the chest pain. Pt denies taking any nitro but per family reports, pt took nitro at home. Pt hypotensive at this time. Dr. Colin Rhein at bedside.

## 2014-04-06 NOTE — Telephone Encounter (Signed)
Appt scheduled 1/19 with PCP.

## 2014-04-06 NOTE — ED Notes (Signed)
Dr. Colin Rhein is at the bedside.

## 2014-04-07 ENCOUNTER — Encounter (HOSPITAL_COMMUNITY): Payer: Self-pay | Admitting: General Practice

## 2014-04-07 ENCOUNTER — Inpatient Hospital Stay (HOSPITAL_COMMUNITY): Payer: Medicare Other

## 2014-04-07 DIAGNOSIS — J9811 Atelectasis: Secondary | ICD-10-CM | POA: Diagnosis present

## 2014-04-07 DIAGNOSIS — C7982 Secondary malignant neoplasm of genital organs: Secondary | ICD-10-CM | POA: Diagnosis present

## 2014-04-07 DIAGNOSIS — I25710 Atherosclerosis of autologous vein coronary artery bypass graft(s) with unstable angina pectoris: Secondary | ICD-10-CM | POA: Diagnosis not present

## 2014-04-07 DIAGNOSIS — E871 Hypo-osmolality and hyponatremia: Secondary | ICD-10-CM | POA: Diagnosis present

## 2014-04-07 DIAGNOSIS — Z9181 History of falling: Secondary | ICD-10-CM | POA: Diagnosis not present

## 2014-04-07 DIAGNOSIS — Z7982 Long term (current) use of aspirin: Secondary | ICD-10-CM | POA: Diagnosis not present

## 2014-04-07 DIAGNOSIS — I25110 Atherosclerotic heart disease of native coronary artery with unstable angina pectoris: Secondary | ICD-10-CM | POA: Diagnosis not present

## 2014-04-07 DIAGNOSIS — I200 Unstable angina: Secondary | ICD-10-CM | POA: Diagnosis not present

## 2014-04-07 DIAGNOSIS — F1010 Alcohol abuse, uncomplicated: Secondary | ICD-10-CM | POA: Diagnosis not present

## 2014-04-07 DIAGNOSIS — I4891 Unspecified atrial fibrillation: Secondary | ICD-10-CM | POA: Diagnosis present

## 2014-04-07 DIAGNOSIS — I429 Cardiomyopathy, unspecified: Secondary | ICD-10-CM | POA: Diagnosis present

## 2014-04-07 DIAGNOSIS — I481 Persistent atrial fibrillation: Secondary | ICD-10-CM | POA: Diagnosis not present

## 2014-04-07 DIAGNOSIS — I482 Chronic atrial fibrillation: Secondary | ICD-10-CM

## 2014-04-07 DIAGNOSIS — M5441 Lumbago with sciatica, right side: Secondary | ICD-10-CM | POA: Diagnosis not present

## 2014-04-07 DIAGNOSIS — R001 Bradycardia, unspecified: Secondary | ICD-10-CM | POA: Diagnosis not present

## 2014-04-07 DIAGNOSIS — I447 Left bundle-branch block, unspecified: Secondary | ICD-10-CM | POA: Diagnosis present

## 2014-04-07 DIAGNOSIS — R0789 Other chest pain: Secondary | ICD-10-CM | POA: Diagnosis not present

## 2014-04-07 DIAGNOSIS — S3992XA Unspecified injury of lower back, initial encounter: Secondary | ICD-10-CM | POA: Diagnosis not present

## 2014-04-07 DIAGNOSIS — F329 Major depressive disorder, single episode, unspecified: Secondary | ICD-10-CM | POA: Diagnosis present

## 2014-04-07 DIAGNOSIS — I10 Essential (primary) hypertension: Secondary | ICD-10-CM | POA: Diagnosis present

## 2014-04-07 DIAGNOSIS — Z951 Presence of aortocoronary bypass graft: Secondary | ICD-10-CM | POA: Diagnosis not present

## 2014-04-07 DIAGNOSIS — I059 Rheumatic mitral valve disease, unspecified: Secondary | ICD-10-CM

## 2014-04-07 DIAGNOSIS — I252 Old myocardial infarction: Secondary | ICD-10-CM | POA: Diagnosis not present

## 2014-04-07 DIAGNOSIS — K219 Gastro-esophageal reflux disease without esophagitis: Secondary | ICD-10-CM | POA: Diagnosis present

## 2014-04-07 DIAGNOSIS — Z87891 Personal history of nicotine dependence: Secondary | ICD-10-CM | POA: Diagnosis not present

## 2014-04-07 DIAGNOSIS — M545 Low back pain: Secondary | ICD-10-CM | POA: Diagnosis not present

## 2014-04-07 DIAGNOSIS — M4850XA Collapsed vertebra, not elsewhere classified, site unspecified, initial encounter for fracture: Secondary | ICD-10-CM

## 2014-04-07 DIAGNOSIS — D61818 Other pancytopenia: Secondary | ICD-10-CM | POA: Diagnosis not present

## 2014-04-07 DIAGNOSIS — I209 Angina pectoris, unspecified: Secondary | ICD-10-CM

## 2014-04-07 HISTORY — DX: Unstable angina: I20.0

## 2014-04-07 HISTORY — DX: Collapsed vertebra, not elsewhere classified, site unspecified, initial encounter for fracture: M48.50XA

## 2014-04-07 LAB — CBC
HCT: 32.3 % — ABNORMAL LOW (ref 39.0–52.0)
Hemoglobin: 11.2 g/dL — ABNORMAL LOW (ref 13.0–17.0)
MCH: 33.8 pg (ref 26.0–34.0)
MCHC: 34.7 g/dL (ref 30.0–36.0)
MCV: 97.6 fL (ref 78.0–100.0)
PLATELETS: 187 10*3/uL (ref 150–400)
RBC: 3.31 MIL/uL — AB (ref 4.22–5.81)
RDW: 13.3 % (ref 11.5–15.5)
WBC: 3.8 10*3/uL — ABNORMAL LOW (ref 4.0–10.5)

## 2014-04-07 LAB — BASIC METABOLIC PANEL
Anion gap: 9 (ref 5–15)
BUN: 15 mg/dL (ref 6–23)
CHLORIDE: 96 meq/L (ref 96–112)
CO2: 28 mmol/L (ref 19–32)
CREATININE: 1 mg/dL (ref 0.50–1.35)
Calcium: 8.4 mg/dL (ref 8.4–10.5)
GFR calc Af Amer: 82 mL/min — ABNORMAL LOW (ref >90)
GFR calc non Af Amer: 71 mL/min — ABNORMAL LOW (ref >90)
GLUCOSE: 82 mg/dL (ref 70–99)
Potassium: 3.8 mmol/L (ref 3.5–5.1)
SODIUM: 133 mmol/L — AB (ref 135–145)

## 2014-04-07 LAB — TROPONIN I
Troponin I: <0.03 ng/mL (ref <0.031)
Troponin I: <0.03 ng/mL (ref <0.031)
Troponin I: <0.03 ng/mL (ref <0.031)

## 2014-04-07 LAB — ETHANOL: Alcohol, Ethyl (B): 145 mg/dL — ABNORMAL HIGH (ref 0–9)

## 2014-04-07 MED ORDER — THIAMINE HCL 100 MG/ML IJ SOLN
100.0000 mg | Freq: Every day | INTRAMUSCULAR | Status: DC
  Filled 2014-04-07 (×2): qty 1

## 2014-04-07 MED ORDER — PANTOPRAZOLE SODIUM 40 MG PO TBEC
40.0000 mg | DELAYED_RELEASE_TABLET | Freq: Every day | ORAL | Status: DC
  Administered 2014-04-07 – 2014-04-09 (×2): 40 mg via ORAL
  Filled 2014-04-07 (×2): qty 1

## 2014-04-07 MED ORDER — INFLUENZA VAC SPLIT QUAD 0.5 ML IM SUSY
0.5000 mL | PREFILLED_SYRINGE | INTRAMUSCULAR | Status: DC
  Filled 2014-04-07 (×2): qty 0.5

## 2014-04-07 MED ORDER — ATORVASTATIN CALCIUM 40 MG PO TABS
40.0000 mg | ORAL_TABLET | Freq: Every day | ORAL | Status: DC
  Administered 2014-04-07 – 2014-04-09 (×3): 40 mg via ORAL
  Filled 2014-04-07 (×3): qty 1

## 2014-04-07 MED ORDER — ADULT MULTIVITAMIN W/MINERALS CH
1.0000 | ORAL_TABLET | Freq: Every day | ORAL | Status: DC
  Administered 2014-04-07 – 2014-04-09 (×2): 1 via ORAL
  Filled 2014-04-07 (×3): qty 1

## 2014-04-07 MED ORDER — LISINOPRIL 5 MG PO TABS
5.0000 mg | ORAL_TABLET | Freq: Every day | ORAL | Status: DC
  Filled 2014-04-07: qty 1

## 2014-04-07 MED ORDER — MORPHINE SULFATE 2 MG/ML IJ SOLN: 2.0000 mg | INTRAMUSCULAR | Status: DC | PRN

## 2014-04-07 MED ORDER — MORPHINE SULFATE 2 MG/ML IJ SOLN
1.0000 mg | INTRAMUSCULAR | Status: DC | PRN
Start: 2014-04-07 — End: 2014-04-09
  Administered 2014-04-07 – 2014-04-08 (×3): 1 mg via INTRAVENOUS
  Filled 2014-04-07 (×3): qty 1

## 2014-04-07 MED ORDER — LORAZEPAM 2 MG/ML IJ SOLN: 1.0000 mg | Freq: Four times a day (QID) | INTRAMUSCULAR | Status: DC | PRN

## 2014-04-07 MED ORDER — HEPARIN SODIUM (PORCINE) 5000 UNIT/ML IJ SOLN
5000.0000 [IU] | Freq: Three times a day (TID) | INTRAMUSCULAR | Status: DC
  Administered 2014-04-07 – 2014-04-08 (×4): 5000 [IU] via SUBCUTANEOUS
  Filled 2014-04-07 (×6): qty 1

## 2014-04-07 MED ORDER — ACETAMINOPHEN 325 MG PO TABS
650.0000 mg | ORAL_TABLET | ORAL | Status: DC | PRN
  Administered 2014-04-07 – 2014-04-08 (×3): 650 mg via ORAL
  Filled 2014-04-07 (×3): qty 2

## 2014-04-07 MED ORDER — HYDROCHLOROTHIAZIDE 25 MG PO TABS
25.0000 mg | ORAL_TABLET | Freq: Every day | ORAL | Status: DC
  Filled 2014-04-07: qty 1

## 2014-04-07 MED ORDER — LORAZEPAM 1 MG PO TABS: 1.0000 mg | ORAL_TABLET | Freq: Four times a day (QID) | ORAL | Status: DC | PRN

## 2014-04-07 MED ORDER — ASPIRIN EC 81 MG PO TBEC
81.0000 mg | DELAYED_RELEASE_TABLET | Freq: Every day | ORAL | Status: DC
Start: 2014-04-07 — End: 2014-04-08
  Administered 2014-04-07: 81 mg via ORAL
  Filled 2014-04-07 (×2): qty 1

## 2014-04-07 MED ORDER — FOLIC ACID 1 MG PO TABS
1.0000 mg | ORAL_TABLET | Freq: Every day | ORAL | Status: DC
  Administered 2014-04-07 – 2014-04-09 (×2): 1 mg via ORAL
  Filled 2014-04-07 (×3): qty 1

## 2014-04-07 MED ORDER — SODIUM CHLORIDE 0.9 % IV SOLN
INTRAVENOUS | Status: DC
  Administered 2014-04-07 (×2): via INTRAVENOUS

## 2014-04-07 MED ORDER — FERROUS SULFATE 325 (65 FE) MG PO TABS
325.0000 mg | ORAL_TABLET | Freq: Every day | ORAL | Status: DC
  Administered 2014-04-09: 325 mg via ORAL
  Filled 2014-04-07 (×4): qty 1

## 2014-04-07 MED ORDER — METOPROLOL TARTRATE 25 MG PO TABS
25.0000 mg | ORAL_TABLET | Freq: Two times a day (BID) | ORAL | Status: DC
Start: 2014-04-07 — End: 2014-04-08
  Administered 2014-04-07 (×2): 25 mg via ORAL
  Filled 2014-04-07 (×4): qty 1

## 2014-04-07 MED ORDER — ONDANSETRON HCL 4 MG/2ML IJ SOLN: 4.0000 mg | Freq: Four times a day (QID) | INTRAMUSCULAR | Status: DC | PRN

## 2014-04-07 MED ORDER — VITAMIN B-1 100 MG PO TABS
100.0000 mg | ORAL_TABLET | Freq: Every day | ORAL | Status: DC
  Administered 2014-04-07 – 2014-04-09 (×2): 100 mg via ORAL
  Filled 2014-04-07 (×3): qty 1

## 2014-04-07 NOTE — Progress Notes (Signed)
  Echocardiogram 2D Echocardiogram has been performed.  [name] FRANCES 04/07/2014, 4:14 PM

## 2014-04-07 NOTE — Evaluation (Signed)
Physical Therapy Evaluation Patient Details Name: [name] MRN: [id number] DOB: Mar 18, 1936 Today's Date: 04/07/2014   History of Present Illness  [name] is a 79 y.o. male presenting with chest pains x 3 days and 2 week history of falls. PMH is significant for CAD s/p CABG (1985, 1997, last cath 2009), Afib not on anticoag, HTN, HLD, GERD, Prostate cancer, OA, depression, ethanol use.  Clinical Impression  Patient demonstrates deficits in functional mobility as indicated below. Will need continued skilled PT to address deficits and maximize function. Will see as indicated and progress as tolerated.  Patient ambulating without physical assist, but limited by pain.     Follow Up Recommendations Home health PT;Supervision - Intermittent    Equipment Recommendations  None recommended by PT    Recommendations for Other Services       Precautions / Restrictions Precautions Precautions: Fall      Mobility  Bed Mobility Overal bed mobility: Needs Assistance Bed Mobility: Supine to Sit;Sit to Supine     Supine to sit: Min guard Sit to supine: Min guard   General bed mobility comments: increased time to perform secondary to pain in low back region  Transfers Overall transfer level: Needs assistance Equipment used: Rolling walker (2 wheeled);None Transfers: Sit to/from [employer] to Stand: Min guard Stand pivot transfers: Min assist       General transfer comment: min guard with VCs for hand placement, increased time to elevated to standing position secondary to low back pain, min assist during transfer from chair to bed  Ambulation/Gait Ambulation/Gait assistance: Min guard Ambulation Distance (Feet): 120 Feet Assistive device: Rolling walker (2 wheeled) Gait Pattern/deviations: Step-through pattern;Decreased stride length;Drifts right/left;Narrow base of support Gait velocity: decreased Gait velocity interpretation: Below normal speed for  age/gender General Gait Details: Patient with very slow gait speed impacting stability, no physical assist required during mobility, cues for safe hand plcement with use of RW.  Stairs            Wheelchair Mobility    Modified Rankin (Stroke Patients Only)       Balance Overall balance assessment: History of Falls                                           Pertinent Vitals/Pain Pain Assessment: 0-10    Home Living Family/patient expects to be discharged to:: Private residence Living Arrangements: Alone ("family friend staying w/me right now")   Type of Home: House Home Access: Stairs to enter Entrance Stairs-Rails: None Technical brewer of Steps: 1 Home Layout: One level        Prior Function Level of Independence: Independent with assistive device(s)               Hand Dominance   Dominant Hand: Right    Extremity/Trunk Assessment   Upper Extremity Assessment: Generalized weakness           Lower Extremity Assessment: Generalized weakness         Communication   Communication: No difficulties  Cognition Arousal/Alertness: Awake/alert Behavior During Therapy: Flat affect Overall Cognitive Status: Within Functional Limits for tasks assessed                      General Comments      Exercises        Assessment/Plan    PT Assessment  Patient needs continued PT services  PT Diagnosis Difficulty walking;Acute pain   PT Problem List Decreased strength;Decreased activity tolerance;Decreased balance;Decreased mobility;Decreased knowledge of use of DME;Decreased safety awareness;Pain  PT Treatment Interventions DME instruction;Gait training;Functional mobility training;Therapeutic activities;Therapeutic exercise;Balance training;Patient/family education   PT Goals (Current goals can be found in the Care Plan section) Acute Rehab PT Goals Patient Stated Goal: to return home PT Goal Formulation: With  patient Time For Goal Achievement: 04/21/14 Potential to Achieve Goals: Good    Frequency Min 3X/week   Barriers to discharge   has friend that stays with him and provides care at this time    Co-evaluation               End of Session Equipment Utilized During Treatment: Gait belt Activity Tolerance: Patient tolerated treatment well;Patient limited by pain Patient left: in bed;with call bell/phone within reach (with transport) Nurse Communication: Mobility status         Time: 1135-1156 PT Time Calculation (min) (ACUTE ONLY): 21 min   Charges:   PT Evaluation $Initial PT Evaluation Tier I: 1 Procedure PT Treatments $Gait Training: 8-22 mins   PT G CodesDuncan Dull 04-25-2014, 12:04 PM Alben Deeds, [place] DPT  [phone]
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
## 2014-04-07 NOTE — Progress Notes (Signed)
Family Medicine Teaching Service Daily Progress Note Intern Pager: [phone]  Patient name: [name] Medical record number: [id number] Date of birth: [birth year] Age: 79 y.o. Gender: male  Primary Care Provider: [name], MD Consultants: Cards Code Status: Full  Pt Overview and Major Events to Date:  1/6: Admitted for CP ruleout  Assessment and Plan: [name] is a 79 y.o. male presenting with chest pains x 3 days and 2 week history of falls. PMH is significant for CAD s/p CABG (1985, 1997, last cath 2009), Afib not on anticoag, HTN, HLD, GERD, Prostate cancer, OA, depression, ethanol use.  # Chest pain: pt with significant cardiac history. Vitals stable in ED. HEART score 6. EKG with stable abnormalities: afib, LBBB, TWI inferior leads, early repol. CXR showing atelectasis LLL. CMP with mild hyponatremia, BNP 206, CBC with hgb 11.3 and MCV 99.1. i-stat troponin 0.01.  - cycled troponins neg - daily ASA - morphine 1mg  q2hrs PRN - patient has not needed - cards consulted; appreciate recs - repeat bmet/cbc in late morning - repeat EKG with no changes from prior  # Afib: not on anticoagulation. Question with reported history of falls and ethanol use whether it would be beneficial vs risk of bleeding from fall - telemetry  #Back pain/ unsteady gait: No sciatic with back pain. Patient has history of falls so be precipitating pain.  -possible neruopathy component from l;ong time alcohol usefrom alchol use  -DG lumbar ordered -will continue to monitor - morphine 1mg  q2hrs PRN pain -PT/OT ordered  # HTN: currently running low - will hold home meds lisinopril, hctz and continue metoprolol  # HLD: - continue home atorvastatin  # Hyponatremia: suspect secondary to chronic alcohol use, no history of withdrawals. - IVF  # Etoh use: Longstanding history of alcohol use, patient with alcohol level of 145 at admission - CIWA protocol - scores 0-1 -thiamine and folic acid  continue  # GERD:stable, reports not using PPI recently - will restart PPI while in hospital  FEN/GI: diet heart healthy Prophylaxis: heparin sq  Disposition: Continue current management as above; pending further work-up.  Subjective:  Patient no longer endorsing chest pain this morning. However he does state that he is having some significant back pain. He describes it as "excrutiating".   Objective: Temp:  [97.7 F (36.5 C)-98.4 F (36.9 C)] 98.4 F (36.9 C) (01/07 0501) Pulse Rate:  [46-70] 60 (01/07 0501) Resp:  [10-18] 18 (01/07 0501) BP: (89-107)/(47-68) 107/68 mmHg (01/07 0501) SpO2:  [96 %-100 %] 96 % (01/07 0501) Weight:  [164 lb 12.8 oz (74.753 kg)-173 lb (78.472 kg)] 164 lb 12.8 oz (74.753 kg) (01/07 0125) Physical Exam: General: NAD, pleasant HEENT: PERRL, EOMI, MMM. Cardiovascular: irregularly irreg, normal s1s2, no murmur appreciated. Distal pulses present. Respiratory: clear bilaterally, normal effort Abdomen: soft, nontender, no rebound or guarding. Normal bowel sounds. Extremities: no edema or cyanosis, WWP. Unable to do leg raise on R.  Skin: no rashes noted Neuro: alert and oriented, no focal deficits  Laboratory: Results for orders placed or performed during the hospital encounter of 04/06/14 (from the past 24 hour(s))  CBC     Status: Abnormal   Collection Time: 04/06/14 10:46 PM  Result Value Ref Range   WBC 5.4 4.0 - 10.5 K/uL   RBC 3.18 (L) 4.22 - 5.81 MIL/uL   Hemoglobin 11.3 (L) 13.0 - 17.0 g/dL   HCT 31.5 (L) 39.0 - 52.0 %   MCV 99.1 78.0 - 100.0 fL  MCH 35.5 (H) 26.0 - 34.0 pg   MCHC 35.9 30.0 - 36.0 g/dL   RDW 13.3 11.5 - 15.5 %   Platelets 217 150 - 400 K/uL  Lipase, blood     Status: None   Collection Time: 04/06/14 10:46 PM  Result Value Ref Range   Lipase 46 11 - 59 U/L  Brain natriuretic peptide     Status: Abnormal   Collection Time: 04/06/14 10:46 PM  Result Value Ref Range   B Natriuretic Peptide 206.0 (H) 0.0 - 100.0 pg/mL   Comprehensive metabolic panel     Status: Abnormal   Collection Time: 04/06/14 10:46 PM  Result Value Ref Range   Sodium 132 (L) 135 - 145 mmol/L   Potassium 3.8 3.5 - 5.1 mmol/L   Chloride 95 (L) 96 - 112 mEq/L   CO2 22 19 - 32 mmol/L   Glucose, Bld 58 (L) 70 - 99 mg/dL   BUN 14 6 - 23 mg/dL   Creatinine, Ser 1.01 0.50 - 1.35 mg/dL   Calcium 8.8 8.4 - 10.5 mg/dL   Total Protein 5.5 (L) 6.0 - 8.3 g/dL   Albumin 3.1 (L) 3.5 - 5.2 g/dL   AST 22 0 - 37 U/L   ALT 9 0 - 53 U/L   Alkaline Phosphatase 89 39 - 117 U/L   Total Bilirubin 1.3 (H) 0.3 - 1.2 mg/dL   GFR calc non Af Amer 70 (L) >90 mL/min   GFR calc Af Amer 81 (L) >90 mL/min   Anion gap 15 5 - 15  I-stat troponin, ED (not at [facility])     Status: None   Collection Time: 04/06/14 10:52 PM  Result Value Ref Range   Troponin i, poc 0.01 0.00 - 0.08 ng/mL   Comment 3          Ethanol     Status: Abnormal   Collection Time: 04/07/14 12:46 AM  Result Value Ref Range   Alcohol, Ethyl (B) 145 (H) 0 - 9 mg/dL  Troponin I (q 6hr x 3)     Status: None   Collection Time: 04/07/14  1:52 AM  Result Value Ref Range   Troponin I <0.03 <0.031 ng/mL  Troponin I (q 6hr x 3)     Status: None   Collection Time: 04/07/14  7:20 AM  Result Value Ref Range   Troponin I <0.03 <0.031 ng/mL    Imaging/Diagnostic Tests: Dg Chest Port 1 View 04/06/2014 IMPRESSION: Borderline heart size. Atelectasis or fibrosis in the left lung base. No active consolidation.     Katheren Shams, DO 04/07/2014, 7:37 AM PGY-1, [place] Intern pager: [phone], text pages welcome

## 2014-04-07 NOTE — Progress Notes (Signed)
Pt arrived to floor in NAD. Pt only complaining of lower back pain at times. VSS. Pt oriented to room and floor. Will continue to monitor. Ronnette Hila, RN

## 2014-04-07 NOTE — Discharge Summary (Signed)
[facility] Discharge Summary  Patient name: [name] Medical record number: [id number] Date of birth: [birth year] Age: 79 y.o. Gender: male Date of Admission: 04/06/2014  Date of Discharge: 04/09/2013 Admitting Physician: Zigmund Gottron, MD  Primary Care Provider: [name], MD Consultants: Cardiology  Indication for Hospitalization: Chest pain  Discharge Diagnoses/Problem List:   Patient Active Problem List   Diagnosis Date Noted  . Low back pain   . Unstable angina 04/07/2014  . Laceration 08/02/2013  . Unsteadiness on feet 07/30/2013  . Syncopal episodes 07/30/2013  . Anemia, iron deficiency 06/02/2013  . Hypomagnesemia 02/21/2013  . Anemia, chronic disease 02/21/2013  . Alcohol dependence 10/25/2012    Class: Acute  . Alcohol withdrawal 10/24/2012  . Chest pain at rest 10/20/2012  . Chest pain 10/04/2012  . Hyponatremia 09/13/2012  . Hypokalemia 09/13/2012  . GERD (gastroesophageal reflux disease) 05/14/2011  . UNSPECIFIED DEFICIENCY ANEMIA 04/24/2010  . At high risk for falls 04/24/2010  . Elevated bilirubin 03/30/2010  . Atrial fibrillation 12/14/2009  . Chronic diastolic heart failure 86/57/8469  . LBBB 04/26/2009  . CONSTIPATION, CHRONIC 04/20/2009  . FAMILIAL TREMOR 03/09/2009  . INGUINAL HERNIA, RIGHT 05/05/2008  . MYELOPATHY OTHER DISEASES CLASSIFIED ELSEWHERE 08/27/2007  . DEPRESSION, CHRONIC 08/11/2007  . CORONARY ARTERY DISEASE 08/11/2007  . PANCREATITIS, HX OF 02/12/2007  . Alcohol abuse 06/11/2006  . HYPERTROPHY PROSTATE BNG W/URINARY OBST/LUTS 06/11/2006  . ERECTILE DYSFUNCTION, ORGANIC 06/11/2006  . HYPERTRIGLYCERIDEMIA 05/29/2006  . HYPERTENSION, BENIGN SYSTEMIC 05/29/2006  . RHINITIS, ALLERGIC 05/29/2006  . REFLUX ESOPHAGITIS 05/29/2006  . BACK PAIN, LOW 05/29/2006  . PROSTATE CANCER, HX OF 05/29/2006    Disposition: Home with [facility] PT/OT  Discharge Condition: Improved  Discharge Exam:  Filed  Vitals:   04/09/14 0554  BP: 128/51  Pulse: 68  Temp: 97.4 F (36.3 C)  Resp: 18   General: NAD, pleasant HEENT: PERRL, EOMI, MMM. Cardiovascular: irregularly irreg, normal s1s2, no murmur appreciated. Distal pulses present. Respiratory: clear bilaterally, normal effort Abdomen: soft, nontender, no rebound or guarding. Normal bowel sounds. Extremities: no edema or cyanosis, WWP.  Skin: no rashes noted Neuro: alert and oriented, no focal deficits  Brief Hospital Course:  [name] is a 79 y.o. male who presented with chest pains x 3 days and 2 week history of falls. PMH is significant for CAD s/p CABG (1985, 1997, last cath 2009), Afib not on anticoag, HTN, HLD, GERD, Prostate cancer, OA, depression, ethanol use.   # Chest pain: Patient with significant cardiac history. HEART score 6. EKG with stable abnormalities: afib, LBBB, TWI inferior leads, early repol. CXR showed atelectasis LLL. CMP with mild hyponatremia(chronic), BNP 206, CBC with hgb 11.3 and MCV 99.1.Cycyled troponins remained negative. Patient remained on daily aspirin. Echo performed showed drop in LVEF, 30-35% and worsening cardiomyopathy. Cardiology was consulted. They performed cardiac catherization without stent placement in setting of unstable angina. He will need follow-up with cardiologist as an outpatient.  # Afib: Permenant. Patient currently rate controlled with metoprolol. He is not on anticoagulation due to greater risk of bleeding with history of falls and EtOH use.    #Back pain/ unsteady gait: Patient complaint of back pain this admission. This was in the setting of a recent falls. Back pain was localized to the lumbosacral region without neuropathy or sciatic. DG lumbar ordered show L2 compression deformity. PT evaluated patient and recommended HH PT for gait instability. Long-term alcohol use seems to be a large component of  these issues. Several discussions about cessation were had and patient states he  will abstain.  # HTN: Patient had soft BPs during admission. We held his home lisionpril-hctz. However we did continue his metoprolol. He will need outpatient monitoring of BPs. Consider adding back therapy as needed.  # Pancytopenia: WBC, Hgb, and platelets low day prior to discharge. Patient is anemic at baseline, though previously had normal WBC and platelets. All trending down through out admission. Will need follow-up monitoring.  The patient's other chronic conditions, including GERD and HLD were stable during this hospitalization and the patient was continued on home medications.    Issues for Follow Up:  1. Follow up conversations about alcohol cessation 2. Monitor back pain and consider MRI in the future if worsening symptoms. 3. HH PT/OT ordered  4. Repeat CBC 5. Cardiology follow-up 6. Monitor BPs, home lisinopriol-hctz held due to low BPs. Add bask as needed.   Significant Procedures: Cardiac Cath  Significant Labs and Imaging:   Recent Labs Lab 04/06/14 2246 04/07/14 1306 04/08/14 1620  WBC 5.4 3.8* 2.8*  HGB 11.3* 11.2* 10.7*  HCT 31.5* 32.3* 30.7*  PLT 217 187 135*    Recent Labs Lab 04/06/14 2246 04/07/14 1306 04/08/14 1620  NA 132* 133*  --   K 3.8 3.8  --   CL 95* 96  --   CO2 22 28  --   GLUCOSE 58* 82  --   BUN 14 15  --   CREATININE 1.01 1.00 0.79  CALCIUM 8.8 8.4  --   ALKPHOS 89  --   --   AST 22  --   --   ALT 9  --   --   ALBUMIN 3.1*  --   --    Dg Chest Port 1 View 04/06/2014 IMPRESSION: Borderline heart size. Atelectasis or fibrosis in the left lung base. No active consolidation.   Dg Lumbar Spine 2-3 Views 1/7/2016IMPRESSION: L2 compression deformity of uncertain chronicity. MRI may be helpful for further evaluation on a nonemergent basis. Electronically Signed By: Inez Catalina M.D. On: 04/07/2014 12:18   Results/Tests Pending at Time of Discharge: None  Discharge Medications:    Medication List    STOP taking these  medications        hydrochlorothiazide 25 MG tablet  Commonly known as:  HYDRODIURIL     lisinopril 5 MG tablet  Commonly known as:  PRINIVIL,ZESTRIL     traMADol 50 MG tablet  Commonly known as:  ULTRAM      TAKE these medications        aspirin EC 81 MG tablet  Take 1 tablet (81 mg total) by mouth daily.     atorvastatin 40 MG tablet  Commonly known as:  LIPITOR  Take 1 tablet (40 mg total) by mouth daily.     ferrous sulfate 325 (65 FE) MG tablet  TAKE 1 TABLET BY MOUTH EVERY DAY WITH BREAKFAST     metoprolol tartrate 25 MG tablet  Commonly known as:  LOPRESSOR  Take 0.5 tablets (12.5 mg total) by mouth 2 (two) times daily.     MULTIVITAMIN PO  Take 1 tablet by mouth daily.     multivitamin with minerals Tabs tablet  Take 1 tablet by mouth daily.     NITROSTAT 0.4 MG SL tablet  Generic drug:  nitroGLYCERIN  PLACE 1 TABLET UNDER THE TONGUE EVERY 5 MINUTES AS NEEDED FOR CHEST PAIN AS DIRECTED     omeprazole 20 MG capsule  Commonly known as:  PRILOSEC  Take 1 capsule (20 mg total) by mouth daily.        Discharge Instructions: Please refer to Patient Instructions section of EMR for full details.  Patient was counseled important signs and symptoms that should prompt return to medical care, changes in medications, dietary instructions, activity restrictions, and follow up appointments.   Follow-Up Appointments: Follow-up Information    Follow up with [name], MD On 04/19/2014.   Specialty:  Family Medicine   Why:  @10 :15a for follow-up   Contact information:   [place] [id number] [phone]       Follow up with [facility].   Why:  home health physical therapy and occupational therapy   Contact information:   [place] [address] [phone]       Jazma Y Phelps, DO 04/10/2014, 9:53 AM PGY-1, [place]

## 2014-04-07 NOTE — Progress Notes (Addendum)
Pt HR drop to 39 non-sustained. Pt asleep at time. Pt received PO lopressor tonight. HR currently 50-60s Afib on monitor. FMTS on called notified. Will continue to monitor.  Pt HR dropped to 36 again. FMTS notified and ordered to notify again if HR sustains in 77s. Will continue to monitor. Ronnette Hila, RN

## 2014-04-07 NOTE — Progress Notes (Signed)
Attempted to see pt. Pt currently out of room at test.  Will return as able. Jinger Neighbors, [place] [phone]

## 2014-04-07 NOTE — ED Notes (Signed)
Admitting resident and admission nurse at bedside.
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
## 2014-04-08 ENCOUNTER — Encounter (HOSPITAL_COMMUNITY)
Admission: EM | Disposition: A | Discharge status: Home Health | Payer: Self-pay | Source: Home / Self Care | Attending: Family Medicine

## 2014-04-08 ENCOUNTER — Encounter (HOSPITAL_COMMUNITY): Payer: Self-pay | Admitting: Interventional Cardiology

## 2014-04-08 DIAGNOSIS — M545 Low back pain, unspecified: Secondary | ICD-10-CM | POA: Insufficient documentation

## 2014-04-08 DIAGNOSIS — I25110 Atherosclerotic heart disease of native coronary artery with unstable angina pectoris: Secondary | ICD-10-CM

## 2014-04-08 DIAGNOSIS — R001 Bradycardia, unspecified: Secondary | ICD-10-CM

## 2014-04-08 DIAGNOSIS — M5442 Lumbago with sciatica, left side: Secondary | ICD-10-CM

## 2014-04-08 DIAGNOSIS — M5441 Lumbago with sciatica, right side: Secondary | ICD-10-CM

## 2014-04-08 DIAGNOSIS — I481 Persistent atrial fibrillation: Secondary | ICD-10-CM

## 2014-04-08 HISTORY — PX: LEFT AND RIGHT HEART CATHETERIZATION WITH CORONARY/GRAFT ANGIOGRAM: SHX5448

## 2014-04-08 LAB — CREATININE, SERUM
Creatinine, Ser: 0.79 mg/dL (ref 0.50–1.35)
GFR calc Af Amer: >90 mL/min (ref >90)
GFR, EST NON AFRICAN AMERICAN: 85 mL/min — AB (ref >90)

## 2014-04-08 LAB — CBC
HCT: 30.7 % — ABNORMAL LOW (ref 39.0–52.0)
HEMOGLOBIN: 10.7 g/dL — AB (ref 13.0–17.0)
MCH: 35.3 pg — ABNORMAL HIGH (ref 26.0–34.0)
MCHC: 34.9 g/dL (ref 30.0–36.0)
MCV: 101.3 fL — AB (ref 78.0–100.0)
Platelets: 135 10*3/uL — ABNORMAL LOW (ref 150–400)
RBC: 3.03 MIL/uL — ABNORMAL LOW (ref 4.22–5.81)
RDW: 13.5 % (ref 11.5–15.5)
WBC: 2.8 10*3/uL — ABNORMAL LOW (ref 4.0–10.5)

## 2014-04-08 LAB — POCT I-STAT 3, ART BLOOD GAS (G3+)
Acid-base deficit: 2 mmol/L (ref 0.0–2.0)
BICARBONATE: 22.4 meq/L (ref 20.0–24.0)
O2 Saturation: 92 %
TCO2: 23 mmol/L (ref 0–100)
pCO2 arterial: 34.6 mmHg — ABNORMAL LOW (ref 35.0–45.0)
pH, Arterial: 7.418 (ref 7.350–7.450)
pO2, Arterial: 63 mmHg — ABNORMAL LOW (ref 80.0–100.0)

## 2014-04-08 LAB — POCT I-STAT 3, VENOUS BLOOD GAS (G3P V)
BICARBONATE: 25.1 meq/L — AB (ref 20.0–24.0)
O2 Saturation: 66 %
PCO2 VEN: 39.9 mmHg — AB (ref 45.0–50.0)
TCO2: 26 mmol/L (ref 0–100)
pH, Ven: 7.406 — ABNORMAL HIGH (ref 7.250–7.300)
pO2, Ven: 34 mmHg (ref 30.0–45.0)

## 2014-04-08 LAB — PROTIME-INR
INR: 1.03 (ref 0.00–1.49)
Prothrombin Time: 13.6 seconds (ref 11.6–15.2)

## 2014-04-08 SURGERY — LEFT AND RIGHT HEART CATHETERIZATION WITH CORONARY/GRAFT ANGIOGRAM
Anesthesia: LOCAL

## 2014-04-08 MED ORDER — FENTANYL CITRATE 0.05 MG/ML IJ SOLN
INTRAMUSCULAR | Status: AC
  Filled 2014-04-08: qty 2

## 2014-04-08 MED ORDER — METOPROLOL TARTRATE 12.5 MG HALF TABLET
12.5000 mg | ORAL_TABLET | Freq: Two times a day (BID) | ORAL | Status: DC
  Administered 2014-04-08 – 2014-04-09 (×2): 12.5 mg via ORAL
  Filled 2014-04-08 (×4): qty 1

## 2014-04-08 MED ORDER — NITROGLYCERIN 1 MG/10 ML FOR IR/CATH LAB
INTRA_ARTERIAL | Status: AC
  Filled 2014-04-08: qty 10

## 2014-04-08 MED ORDER — SODIUM CHLORIDE 0.9 % IJ SOLN: 3.0000 mL | INTRAMUSCULAR | Status: DC | PRN

## 2014-04-08 MED ORDER — MIDAZOLAM HCL 2 MG/2ML IJ SOLN
INTRAMUSCULAR | Status: AC
  Filled 2014-04-08: qty 2

## 2014-04-08 MED ORDER — SODIUM CHLORIDE 0.9 % IJ SOLN: 3.0000 mL | Freq: Two times a day (BID) | INTRAMUSCULAR | Status: DC

## 2014-04-08 MED ORDER — SODIUM CHLORIDE 0.9 % IV SOLN: 250.0000 mL | INTRAVENOUS | Status: DC

## 2014-04-08 MED ORDER — SODIUM CHLORIDE 0.9 % IV SOLN
1.0000 mL/kg/h | INTRAVENOUS | Status: AC
  Administered 2014-04-08: 1 mL/kg/h via INTRAVENOUS

## 2014-04-08 MED ORDER — HEPARIN SODIUM (PORCINE) 5000 UNIT/ML IJ SOLN
5000.0000 [IU] | Freq: Three times a day (TID) | INTRAMUSCULAR | Status: DC
  Administered 2014-04-09: 5000 [IU] via SUBCUTANEOUS
  Filled 2014-04-08 (×3): qty 1

## 2014-04-08 MED ORDER — LIDOCAINE HCL (PF) 1 % IJ SOLN
INTRAMUSCULAR | Status: AC
  Filled 2014-04-08: qty 30

## 2014-04-08 MED ORDER — ONDANSETRON HCL 4 MG/2ML IJ SOLN: 4.0000 mg | Freq: Four times a day (QID) | INTRAMUSCULAR | Status: DC | PRN

## 2014-04-08 MED ORDER — ACETAMINOPHEN 325 MG PO TABS: 650.0000 mg | ORAL_TABLET | ORAL | Status: DC | PRN

## 2014-04-08 MED ORDER — HEPARIN (PORCINE) IN NACL 2-0.9 UNIT/ML-% IJ SOLN
INTRAMUSCULAR | Status: AC
  Filled 2014-04-08: qty 1500

## 2014-04-08 MED ORDER — ASPIRIN 81 MG PO CHEW: 81.0000 mg | CHEWABLE_TABLET | ORAL | Status: DC

## 2014-04-08 MED ORDER — ASPIRIN EC 81 MG PO TBEC
81.0000 mg | DELAYED_RELEASE_TABLET | Freq: Every day | ORAL | Status: DC
  Administered 2014-04-09: 81 mg via ORAL
  Filled 2014-04-08: qty 1

## 2014-04-08 NOTE — Progress Notes (Signed)
PT Cancellation Note  Patient Details Name: [name] MRN: [id number] DOB: Jul 17, 1935   Cancelled Treatment:    Reason Eval/Treat Not Completed: Patient not medically ready; (Bedrest following procedure.)   Duncan Dull 04/08/2014, 3:58 PM Alben Deeds, [place] DPT  [phone]

## 2014-04-08 NOTE — H&P (View-Only) (Signed)
Subjective: No chest pain, no SOB  Objective: Vital signs in last 24 hours: Temp:  [97.4 F (36.3 C)-98.2 F (36.8 C)] 97.4 F (36.3 C) (01/08 0555) Pulse Rate:  [62-77] 62 (01/08 0555) Resp:  [16-20] 18 (01/08 0555) BP: (100-128)/(44-68) 128/59 mmHg (01/08 0555) SpO2:  [96 %-98 %] 97 % (01/08 0555) Weight:  [168 lb 3.4 oz (76.3 kg)] 168 lb 3.4 oz (76.3 kg) (01/08 0555) Weight change: -4 lb 12.6 oz (-2.172 kg) Last BM Date: 04/06/14 Intake/Output from previous day: +1395 01/07 0701 - 01/08 0700 In: 2495 [P.O.:1060; I.V.:1435] Out: 1100 [Urine:1100] Intake/Output this shift:    PE: General:Pleasant affect, NAD Skin:Warm and dry, brisk capillary refill HEENT:normocephalic, sclera clear, mucus membranes moist Heart:irreg irreg without murmur, gallup, rub or click Lungs: with few crackles, no rhonchi, or wheezes ZMO:QHUT, non tender, + BS, do not palpate liver spleen or masses Ext:no lower ext edema, 2+ pedal pulses, 2+ radial pulses Neuro:alert and oriented X 3, MAE, follows commands, + facial symmetry  tele:  A fib with HR down to the 30s several times   Lab Results:  Recent Labs  04/06/14 2246 04/07/14 1306  WBC 5.4 3.8*  HGB 11.3* 11.2*  HCT 31.5* 32.3*  PLT 217 187   BMET  Recent Labs  04/06/14 2246 04/07/14 1306  NA 132* 133*  K 3.8 3.8  CL 95* 96  CO2 22 28  GLUCOSE 58* 82  BUN 14 15  CREATININE 1.01 1.00  CALCIUM 8.8 8.4    Recent Labs  04/07/14 0720 04/07/14 1306  TROPONINI <0.03 <0.03    Lab Results  Component Value Date   CHOL 104 02/21/2013   HDL 64 02/21/2013   LDLCALC 26 02/21/2013   LDLDIRECT 53 10/26/2009   TRIG 68 02/21/2013   CHOLHDL 1.6 02/21/2013   Lab Results  Component Value Date   HGBA1C 4.6 10/07/2010     Lab Results  Component Value Date   TSH 2.339 07/30/2013    Hepatic Function Panel  Recent Labs  04/06/14 2246  PROT 5.5*  ALBUMIN 3.1*  AST 22  ALT 9  ALKPHOS 89  BILITOT 1.3*   No  results for input(s): CHOL in the last 72 hours. No results for input(s): PROTIME in the last 72 hours.     Studies/Results: Dg Lumbar Spine 2-3 Views  04/07/2014   CLINICAL DATA:  Low back pain following fall yesterday, initial encounter  EXAM: LUMBAR SPINE - 2-3 VIEW  COMPARISON:  02/21/2013  FINDINGS: Five lumbar type vertebral bodies are well visualized. There is compression deformity of L2 which is new from the previous exam and may be related to the recent injury. Disc space narrowing is noted at L3-4 and L4-5. Aortoiliac calcifications are noted. No other focal abnormality is seen.  IMPRESSION: L2 compression deformity of uncertain chronicity. MRI may be helpful for further evaluation on a nonemergent basis.   Electronically Signed   By: Inez Catalina M.D.   On: 04/07/2014 12:18   Dg Chest Port 1 View  04/06/2014   CLINICAL DATA:  Chest pain.  Back pain.  EXAM: PORTABLE CHEST - 1 VIEW  COMPARISON:  02/21/2013  FINDINGS: Postoperative changes in the mediastinum. Borderline heart size with normal pulmonary vascularity. Slight fibrosis or atelectasis in the left lung base. No focal airspace consolidation in the lungs. No blunting of costophrenic angles. No pneumothorax. Old fracture deformity of the distal right clavicle.  IMPRESSION: Borderline heart size. Atelectasis  or fibrosis in the left lung base. No active consolidation.   Electronically Signed   By: Lucienne Capers M.D.   On: 04/06/2014 22:56   2D Echo: Study Conclusions - Left ventricle: The cavity size was moderately dilated. Wall thickness was increased in a pattern of mild LVH. Systolic function was moderately to severely reduced. The estimated ejection fraction was in the range of 30% to 35%. There is akinesis of the mid-apicalanterior myocardium. - Mitral valve: There was mild regurgitation. - Left atrium: The atrium was moderately dilated. - Right ventricle: The cavity size was moderately dilated. Wall thickness was  normal. - Right atrium: The atrium was moderately dilated. - Pulmonary arteries: Systolic pressure was mildly increased. PA peak pressure: 39 mm Hg (S). Impressions: - When compared to prior study, EF is reduced.(Echo in 2011 EF 50-55%)    Medications: I have reviewed the patient's current medications. Scheduled Meds: . aspirin EC  81 mg Oral Daily  . atorvastatin  40 mg Oral Daily  . ferrous sulfate  325 mg Oral Q breakfast  . folic acid  1 mg Oral Daily  . heparin  5,000 Units Subcutaneous 3 times per day  . Influenza vac split quadrivalent PF  0.5 mL Intramuscular Tomorrow-1000  . metoprolol tartrate  25 mg Oral BID  . multivitamin with minerals  1 tablet Oral Daily  . pantoprazole  40 mg Oral Daily  . thiamine  100 mg Oral Daily   Continuous Infusions: . sodium chloride 75 mL/hr at 04/07/14 1758   PRN Meds:.acetaminophen, LORazepam **OR** LORazepam, morphine injection, ondansetron (ZOFRAN) IV  Assessment/Plan: 79 y.o. male with a history of CABG in '87 and '95, Cardiac Cath in 2011, A-fib, and ETOH abuse, who presented to the ED on 04/06/13 after a fall.   Chest pain - atypical and story is unclear, Ez negative MI, now with decrease in EF from 50-55% in 2011 to 30-35% now . ECG is abnl at baseline. Recommend cardiac risk factor reduction, he is currently on aspirin, beta blocker and statin.  If his EF is newly decreased and he has wall motion abnormalities, cardiac catheterization vs. nuc study- Dr. Harrington Challenger to see. Pt is NPO now (last cath 2009- Patent LIMA, VG-diag,VG-OM, the VG-RCA was occluded)   Discussed 1% chance of stroke heart attack and death with cath but benefit outweighs risk.    Unstable angina - see above  Bradycardia:  HR down to 39 during the night and then to 36, both brief episodes, Pt on lopressor 25 BID will decrease to 12.5 BID. , hx of bradycardia in 2009.  Alcohol abuse - per IM- we did discuss toxic effects of ETOH on heart tissue   HYPERTENSION, BENIGN  SYSTEMIC - Good control on current rx   Atrial fibrillation - Permanent, rate currently controlled on metoprolol 25 mg bid.   GERD (gastroesophageal reflux disease) - per IM  Cardiomyopathy, could be ETOH induced but will plan for rt and Lt heart cath to further eval.   Pt agreeable.    LOS: 2 days   Time spent with pt. :15 minutes. [facility]  Nurse Practitioner Certified Pager [phone] or after 5pm and on weekends call [phone] 04/08/2014, 7:47 AM   Patinet seen and examined  Case reviewed  Patient s/p CABG x 2  Admitted for CP  Echo shows drop in LVEF from 2011  Need to redefine anatomy.  Plan for L heart cath to define  Patinet understands and agrees to proceed.   VOlume  status looks good  Patinet is just on metoprolol  Meds will need to be adjusted after cath given that LVEF is now down.    Keep on statin.    Dorris Carnes

## 2014-04-08 NOTE — Interval H&P Note (Signed)
Cath Lab Visit (complete for each Cath Lab visit)  Clinical Evaluation Leading to the Procedure:   ACS: Yes.    Non-ACS:    Anginal Classification: CCS IV  Anti-ischemic medical therapy: Minimal Therapy (1 class of medications)  Non-Invasive Test Results: No non-invasive testing performed  Prior CABG: Previous CABG    TIMI SCORE  Patient Information:  TIMI Score is 4  UA/NSTEMI and intermediate-risk features (e.g., TIMI score 3?4) for short-term risk of death or nonfatal MI  Revascularization of the presumed culprit artery   A (8)  Indication: 10; Score: 8   History and Physical Interval Note:  04/08/2014 2:08 PM  [name]  has presented today for surgery, with the diagnosis of cp, low EF  The various methods of treatment have been discussed with the patient and family. After consideration of risks, benefits and other options for treatment, the patient has consented to  Procedure(s): LEFT AND RIGHT HEART CATHETERIZATION WITH CORONARY/GRAFT ANGIOGRAM (N/A) as a surgical intervention .  The patient's history has been reviewed, patient examined, no change in status, stable for surgery.  I have reviewed the patient's chart and labs.  Questions were answered to the patient's satisfaction.     Nakaya Mishkin S.

## 2014-04-08 NOTE — CV Procedure (Signed)
    PROCEDURE:  Right and Left heart catheterization with selective coronary angiography, left ventriculogram.  Bypass angiography.  INDICATIONS:  Unstable angina,   The risks, benefits, and details of the procedure were explained to the patient.  The patient verbalized understanding and wanted to proceed.  Informed written consent was obtained.  PROCEDURE TECHNIQUE:  After Xylocaine anesthesia a 10F sheath was placed in the right femoral artery with a single anterior needle wall stick.   Left coronary angiography was done using a Judkins L4 guide catheter.  Right coronary angiography was done using a Judkins R4 guide catheter.  Left ventriculography was done using a pigtail catheter.    CONTRAST:  Total of 90 cc.  COMPLICATIONS:  None.    HEMODYNAMICS:  Aortic pressure was 130/60; LV pressure was 132/11; LVEDP 16.  There was no gradient between the left ventricle and aorta.  Right atrial pressure 3/5, mean right atrial pressure 4 mmHg; RV pressure 40 over 0, RV EDP 5 mmHg; PA pressure 38/12, mean PA pressure 44 mmHg; pulmonary Where wedge pressure 13/18, mean pulmonary capillary wedge pressure 12 mmHg. Aortic saturation 92%, PA saturation 66%, cardiac output 6.5 L/m, cardiac index 3.3  ANGIOGRAPHIC DATA:   The left main coronary artery is patent.  The left anterior descending artery is occluded in the midportion. Proximally, there is severe disease. The distal LAD fills from a widely patent LIMA to LAD graft. There is moderate disease in the mid to distal LAD.  The SVG to diagonal is occluded. There are collaterals from the distal LAD to the diagonal territory.  The left circumflex artery is a medium size vessel. Circumflex is patent. There were collaterals to the RCA system.  There is an occluded obtuse marginal. The SVG to OM is patent. There is moderate disease in the proximal portion of the graft, but it is not flow-limiting.  The right coronary artery is occluded. The distal vessel fills  from a large epicardial collateral from the circumflex. The SVG to RCA was previously known to be occluded.  LEFT VENTRICULOGRAM:  Left ventricular angiogram was done in the 30 RAO projection and revealed normal left ventricular wall motion and globally decreased systolic function with an estimated ejection fraction of 25 %.  LVEDP was 16 mmHg.  IMPRESSIONS:  1. Severe native three-vessel coronary artery disease. Patent LIMA to LAD. Patent SVG to OM. SVG to diagonal is now occluded. 2. Moderately decreased left ventricular systolic function.  LVEDP 16 mmHg.  Ejection fraction 25%. 3.   Upper normal pulmonary artery pressures.   Heart failure appears to be fairly well compensated.  Aortic saturation 92%, PA saturation 66%, cardiac output 6.5 L/m, cardiac index 3.3.  RECOMMENDATION:  Medical therapy for CAD and worsening cardiomyopathy.  Of note, the patient had some difficulty lying still for the procedure. He would have to be either heavily sedated or intubated to have any type of prolonged procedure performed.  He needs to abstain from alcohol.

## 2014-04-08 NOTE — Progress Notes (Signed)
OT Cancellation Note  Patient Details Name: KADARIUS CUFFE MRN: [id number] DOB: 08-24-1935   Cancelled Treatment:    Reason Eval/Treat Not Completed: Medical issues which prohibited therapy (Bedrest following procedure.)  Malka So 04/08/2014, 3:58 PM

## 2014-04-08 NOTE — Progress Notes (Signed)
Site area: rt groin Site Prior to Removal:  Level  0 Pressure Applied For: 20 minutes Manual:   yes Patient Status During Pull:  stable Post Pull Site:  Level 0 Post Pull Instructions Given:  Yes  Post Pull Pulses Present: 0 Dressing Applied:  tegaderm Bedrest begins @ 2446 Comments: no complications

## 2014-04-08 NOTE — Progress Notes (Addendum)
Subjective: No chest pain, no SOB  Objective: Vital signs in last 24 hours: Temp:  [97.4 F (36.3 C)-98.2 F (36.8 C)] 97.4 F (36.3 C) (01/08 0555) Pulse Rate:  [62-77] 62 (01/08 0555) Resp:  [16-20] 18 (01/08 0555) BP: (100-128)/(44-68) 128/59 mmHg (01/08 0555) SpO2:  [96 %-98 %] 97 % (01/08 0555) Weight:  [168 lb 3.4 oz (76.3 kg)] 168 lb 3.4 oz (76.3 kg) (01/08 0555) Weight change: -4 lb 12.6 oz (-2.172 kg) Last BM Date: 04/06/14 Intake/Output from previous day: +1395 01/07 0701 - 01/08 0700 In: 2495 [P.O.:1060; I.V.:1435] Out: 1100 [Urine:1100] Intake/Output this shift:    PE: General:Pleasant affect, NAD Skin:Warm and dry, brisk capillary refill HEENT:normocephalic, sclera clear, mucus membranes moist Heart:irreg irreg without murmur, gallup, rub or click Lungs: with few crackles, no rhonchi, or wheezes STM:HDQQ, non tender, + BS, do not palpate liver spleen or masses Ext:no lower ext edema, 2+ pedal pulses, 2+ radial pulses Neuro:alert and oriented X 3, MAE, follows commands, + facial symmetry  tele:  A fib with HR down to the 30s several times   Lab Results:  Recent Labs  04/06/14 2246 04/07/14 1306  WBC 5.4 3.8*  HGB 11.3* 11.2*  HCT 31.5* 32.3*  PLT 217 187   BMET  Recent Labs  04/06/14 2246 04/07/14 1306  NA 132* 133*  K 3.8 3.8  CL 95* 96  CO2 22 28  GLUCOSE 58* 82  BUN 14 15  CREATININE 1.01 1.00  CALCIUM 8.8 8.4    Recent Labs  04/07/14 0720 04/07/14 1306  TROPONINI <0.03 <0.03    Lab Results  Component Value Date   CHOL 104 02/21/2013   HDL 64 02/21/2013   LDLCALC 26 02/21/2013   LDLDIRECT 53 10/26/2009   TRIG 68 02/21/2013   CHOLHDL 1.6 02/21/2013   Lab Results  Component Value Date   HGBA1C 4.6 10/07/2010     Lab Results  Component Value Date   TSH 2.339 07/30/2013    Hepatic Function Panel  Recent Labs  04/06/14 2246  PROT 5.5*  ALBUMIN 3.1*  AST 22  ALT 9  ALKPHOS 89  BILITOT 1.3*   No  results for input(s): CHOL in the last 72 hours. No results for input(s): PROTIME in the last 72 hours.     Studies/Results: Dg Lumbar Spine 2-3 Views  04/07/2014   CLINICAL DATA:  Low back pain following fall yesterday, initial encounter  EXAM: LUMBAR SPINE - 2-3 VIEW  COMPARISON:  02/21/2013  FINDINGS: Five lumbar type vertebral bodies are well visualized. There is compression deformity of L2 which is new from the previous exam and may be related to the recent injury. Disc space narrowing is noted at L3-4 and L4-5. Aortoiliac calcifications are noted. No other focal abnormality is seen.  IMPRESSION: L2 compression deformity of uncertain chronicity. MRI may be helpful for further evaluation on a nonemergent basis.   Electronically Signed   By: Inez Catalina M.D.   On: 04/07/2014 12:18   Dg Chest Port 1 View  04/06/2014   CLINICAL DATA:  Chest pain.  Back pain.  EXAM: PORTABLE CHEST - 1 VIEW  COMPARISON:  02/21/2013  FINDINGS: Postoperative changes in the mediastinum. Borderline heart size with normal pulmonary vascularity. Slight fibrosis or atelectasis in the left lung base. No focal airspace consolidation in the lungs. No blunting of costophrenic angles. No pneumothorax. Old fracture deformity of the distal right clavicle.  IMPRESSION: Borderline heart size. Atelectasis  or fibrosis in the left lung base. No active consolidation.   Electronically Signed   By: Lucienne Capers M.D.   On: 04/06/2014 22:56   2D Echo: Study Conclusions - Left ventricle: The cavity size was moderately dilated. Wall thickness was increased in a pattern of mild LVH. Systolic function was moderately to severely reduced. The estimated ejection fraction was in the range of 30% to 35%. There is akinesis of the mid-apicalanterior myocardium. - Mitral valve: There was mild regurgitation. - Left atrium: The atrium was moderately dilated. - Right ventricle: The cavity size was moderately dilated. Wall thickness was  normal. - Right atrium: The atrium was moderately dilated. - Pulmonary arteries: Systolic pressure was mildly increased. PA peak pressure: 39 mm Hg (S). Impressions: - When compared to prior study, EF is reduced.(Echo in 2011 EF 50-55%)    Medications: I have reviewed the patient's current medications. Scheduled Meds: . aspirin EC  81 mg Oral Daily  . atorvastatin  40 mg Oral Daily  . ferrous sulfate  325 mg Oral Q breakfast  . folic acid  1 mg Oral Daily  . heparin  5,000 Units Subcutaneous 3 times per day  . Influenza vac split quadrivalent PF  0.5 mL Intramuscular Tomorrow-1000  . metoprolol tartrate  25 mg Oral BID  . multivitamin with minerals  1 tablet Oral Daily  . pantoprazole  40 mg Oral Daily  . thiamine  100 mg Oral Daily   Continuous Infusions: . sodium chloride 75 mL/hr at 04/07/14 1758   PRN Meds:.acetaminophen, LORazepam **OR** LORazepam, morphine injection, ondansetron (ZOFRAN) IV  Assessment/Plan: 79 y.o. male with a history of CABG in '87 and '95, Cardiac Cath in 2011, A-fib, and ETOH abuse, who presented to the ED on 04/06/13 after a fall.   Chest pain - atypical and story is unclear, Ez negative MI, now with decrease in EF from 50-55% in 2011 to 30-35% now . ECG is abnl at baseline. Recommend cardiac risk factor reduction, he is currently on aspirin, beta blocker and statin.  If his EF is newly decreased and he has wall motion abnormalities, cardiac catheterization vs. nuc study- Dr. Harrington Challenger to see. Pt is NPO now (last cath 2009- Patent LIMA, VG-diag,VG-OM, the VG-RCA was occluded)   Discussed 1% chance of stroke heart attack and death with cath but benefit outweighs risk.    Unstable angina - see above  Bradycardia:  HR down to 39 during the night and then to 36, both brief episodes, Pt on lopressor 25 BID will decrease to 12.5 BID. , hx of bradycardia in 2009.  Alcohol abuse - per IM- we did discuss toxic effects of ETOH on heart tissue   HYPERTENSION, BENIGN  SYSTEMIC - Good control on current rx   Atrial fibrillation - Permanent, rate currently controlled on metoprolol 25 mg bid.   GERD (gastroesophageal reflux disease) - per IM  Cardiomyopathy, could be ETOH induced but will plan for rt and Lt heart cath to further eval.   Pt agreeable.    LOS: 2 days   Time spent with pt. :15 minutes. [facility]  Nurse Practitioner Certified Pager [phone] or after 5pm and on weekends call [phone] 04/08/2014, 7:47 AM   Patinet seen and examined  Case reviewed  Patient s/p CABG x 2  Admitted for CP  Echo shows drop in LVEF from 2011  Need to redefine anatomy.  Plan for L heart cath to define  Patinet understands and agrees to proceed.   VOlume  status looks good  Patinet is just on metoprolol  Meds will need to be adjusted after cath given that LVEF is now down.    Keep on statin.    Dorris Carnes

## 2014-04-08 NOTE — Progress Notes (Signed)
Family Medicine Teaching Service Daily Progress Note Intern Pager: [phone]  Patient name: [name] Medical record number: [id number] Date of birth: [birth year] Age: 79 y.o. Gender: male  Primary Care Provider: [name], MD Consultants: Cards Code Status: Full  Pt Overview and Major Events to Date:  1/6: Admitted for CP ruleout 1/8: Cardiac cath today  Assessment and Plan: [name] is a 79 y.o. male presenting with chest pains x 3 days and 2 week history of falls. PMH is significant for CAD s/p CABG (1985, 1997, last cath 2009), Afib not on anticoag, HTN, HLD, GERD, Prostate cancer, OA, depression, ethanol use.  # Chest pain: pt with significant cardiac history. Vitals stable in ED. HEART score 6. EKG with stable abnormalities: afib, LBBB, TWI inferior leads, early repol. CXR showing atelectasis LLL. CMP with mild hyponatremia, BNP 206, CBC with hgb 11.3 and MCV 99.1. i-stat troponin 0.01.  - cycled troponins neg - daily ASA - cards consulted; will do cath today - echo with drop in LVEF 30-35% -will need adjusting of medications will wait until after cath results  # Afib: not on anticoagulation. Question with reported history of falls and ethanol use whether it would be beneficial vs risk of bleeding from fall - telemetry  #Back pain/ unsteady gait: No sciatic with back pain. Patient has history of falls so be precipitating pain.  -possible neruopathy component from l;ong time alcohol usefrom alchol use  -DG lumbar with some degenerative changes; no need for surgery or MRI at this time -will continue to monitor - morphine 1mg  q2hrs PRN pain -PT/OT recs - HH PT  # HTN: currently normotensive (sytolic 182X/ diastolic 93Z) - will hold home lisinopril, hctz and continue metoprolol  # HLD: - continue home atorvastatin  # Hyponatremia: suspect secondary to chronic alcohol use, no history of withdrawals. - IVF  # Etoh use: Longstanding history of alcohol use,  patient with alcohol level of 145 at admission - CIWA protocol - scores 0 -thiamine and folic acid continue  # GERD:stable, reports not using PPI recently. - continue PPI  FEN/GI: NPO for procedure Prophylaxis: heparin sq  Disposition: Continue current management as above; pending cards recs. Possible home today.  Subjective:  Patient had some transient episodes of bradycardia overnight that was not sustained. He is doing well and denies CP.  Objective: Temp:  [97.4 F (36.3 C)-98.2 F (36.8 C)] 97.4 F (36.3 C) (01/08 0555) Pulse Rate:  [62-77] 62 (01/08 0555) Resp:  [16-20] 18 (01/08 0555) BP: (100-128)/(44-68) 128/59 mmHg (01/08 0555) SpO2:  [96 %-98 %] 97 % (01/08 0555) Weight:  [168 lb 3.4 oz (76.3 kg)] 168 lb 3.4 oz (76.3 kg) (01/08 0555) Physical Exam: General: NAD, pleasant HEENT: PERRL, EOMI, MMM. Cardiovascular: irregularly irreg, normal s1s2, no murmur appreciated. Distal pulses present. Respiratory: clear bilaterally, normal effort Abdomen: soft, nontender, no rebound or guarding. Normal bowel sounds. Extremities: no edema or cyanosis, WWP.  Skin: no rashes noted Neuro: alert and oriented, no focal deficits  Laboratory: Results for orders placed or performed during the hospital encounter of 04/06/14 (from the past 24 hour(s))  Troponin I (q 6hr x 3)     Status: None   Collection Time: 04/07/14  1:06 PM  Result Value Ref Range   Troponin I <0.03 <0.031 ng/mL  CBC     Status: Abnormal   Collection Time: 04/07/14  1:06 PM  Result Value Ref Range   WBC 3.8 (L) 4.0 - 10.5 K/uL  RBC 3.31 (L) 4.22 - 5.81 MIL/uL   Hemoglobin 11.2 (L) 13.0 - 17.0 g/dL   HCT 32.3 (L) 39.0 - 52.0 %   MCV 97.6 78.0 - 100.0 fL   MCH 33.8 26.0 - 34.0 pg   MCHC 34.7 30.0 - 36.0 g/dL   RDW 13.3 11.5 - 15.5 %   Platelets 187 150 - 400 K/uL  Basic metabolic panel     Status: Abnormal   Collection Time: 04/07/14  1:06 PM  Result Value Ref Range   Sodium 133 (L) 135 - 145 mmol/L    Potassium 3.8 3.5 - 5.1 mmol/L   Chloride 96 96 - 112 mEq/L   CO2 28 19 - 32 mmol/L   Glucose, Bld 82 70 - 99 mg/dL   BUN 15 6 - 23 mg/dL   Creatinine, Ser 1.00 0.50 - 1.35 mg/dL   Calcium 8.4 8.4 - 10.5 mg/dL   GFR calc non Af Amer 71 (L) >90 mL/min   GFR calc Af Amer 82 (L) >90 mL/min   Anion gap 9 5 - 15    Imaging/Diagnostic Tests: Dg Chest Port 1 View 04/06/2014 IMPRESSION: Borderline heart size. Atelectasis or fibrosis in the left lung base. No active consolidation.     Dg Lumbar Spine 2-3 Views  04/07/2014   CLINICAL DATA:  Low back pain following fall yesterday, initial encounter  EXAM: LUMBAR SPINE - 2-3 VIEW  COMPARISON:  02/21/2013  FINDINGS: Five lumbar type vertebral bodies are well visualized. There is compression deformity of L2 which is new from the previous exam and may be related to the recent injury. Disc space narrowing is noted at L3-4 and L4-5. Aortoiliac calcifications are noted. No other focal abnormality is seen.  IMPRESSION: L2 compression deformity of uncertain chronicity. MRI may be helpful for further evaluation on a nonemergent basis.   Electronically Signed   By: Inez Catalina M.D.   On: 04/07/2014 12:18    Katheren Shams, DO 04/08/2014, 8:15 AM PGY-1, [name] Intern pager: [phone], text pages welcome

## 2014-04-09 DIAGNOSIS — R0789 Other chest pain: Secondary | ICD-10-CM

## 2014-04-09 MED ORDER — METOPROLOL TARTRATE 25 MG PO TABS: 12.5000 mg | ORAL_TABLET | Freq: Two times a day (BID) | ORAL | Status: DC

## 2014-04-09 NOTE — Progress Notes (Signed)
Physical Therapy Treatment Patient Details Name: [name] MRN: [id number] DOB: December 17, 1935 Today's Date: 04/09/2014    History of Present Illness [name] is a 79 y.o. male presenting with chest pains x 3 days and 2 week history of falls. PMH is significant for CAD s/p CABG (1985, 1997, last cath 2009), Afib not on anticoag, HTN, HLD, GERD, Prostate cancer, OA, depression, ethanol use.    PT Comments    Pt making great progress and able to ambulate with RW and supervision for safey.  Continue to recommend HHT with supervision for safety.   Follow Up Recommendations  Home health PT;Supervision - Intermittent     Equipment Recommendations  None recommended by PT    Recommendations for Other Services       Precautions / Restrictions Precautions Precautions: Fall    Mobility  Bed Mobility Overal bed mobility: Needs Assistance Bed Mobility: Supine to Sit;Sit to Supine     Supine to sit: Supervision;HOB elevated Sit to supine: Min guard   General bed mobility comments: increased time to perform secondary to pain in low back region  Transfers Overall transfer level: Needs assistance Equipment used: Rolling walker (2 wheeled) Transfers: Sit to/from Stand Sit to Stand: Min guard         General transfer comment: no physical or verbal cues, min guard due to some unsteadiness initially  Ambulation/Gait Ambulation/Gait assistance: Supervision Ambulation Distance (Feet): 120 Feet Assistive device: Rolling walker (2 wheeled) Gait Pattern/deviations: Step-through pattern;Decreased stride length Gait velocity: decreased   General Gait Details: guarded gait due to lower back pain   Stairs            Wheelchair Mobility    Modified Rankin (Stroke Patients Only)       Balance Overall balance assessment: History of Falls                                  Cognition Arousal/Alertness: Awake/alert Behavior During Therapy: Flat  affect Overall Cognitive Status: No family/caregiver present to determine baseline cognitive functioning       Memory: Decreased short-term memory (could not recall how to use call bell)              Exercises      General Comments        Pertinent Vitals/Pain Pain Assessment: Faces Faces Pain Scale: Hurts little more Pain Location: sacrum Pain Descriptors / Indicators: Sore Pain Intervention(s): Repositioned (placed pillow under )    Home Living Family/patient expects to be discharged to:: Private residence Living Arrangements: Non-relatives/Friends Available Help at Discharge: Friend(s) Type of Home: House Home Access: Stairs to enter Entrance Stairs-Rails: None Home Layout: One level Home Equipment: Environmental consultant - 2 wheels;Bedside commode;Grab bars - tub/shower      Prior Function Level of Independence: Independent with assistive device(s)      Comments: furniture walked in house, walker outside of house, sponge bathed at sink, friend does meal prep and housekeeping.   PT Goals (current goals can now be found in the care plan section) Acute Rehab PT Goals Patient Stated Goal: to return home Progress towards PT goals: Progressing toward goals    Frequency  Min 3X/week    PT Plan Current plan remains appropriate    Co-evaluation             End of Session Equipment Utilized During Treatment: Gait belt Activity Tolerance: Patient tolerated treatment well;Patient limited  by pain Patient left: in bed;with call bell/phone within reach     Time: 0915-0940 PT Time Calculation (min) (ACUTE ONLY): 25 min  Charges:  $Gait Training: 8-22 mins $Therapeutic Activity: 8-22 mins                    G Codes:      Morayma Godown 04-15-2014, 10:38 AM  Antoine Poche, PT DPT [phone]

## 2014-04-09 NOTE — Care Management Note (Signed)
    Page 1 of 2   04/09/2014     11:31:24 AM CARE MANAGEMENT NOTE 04/09/2014  Patient:  [name], [name]   Account Number:  [id number]  Date Initiated:  04/09/2014  Documentation initiated by:  [facility]  Subjective/Objective Assessment:   adm: Chest pain - atypical     Action/Plan:   discharge planning   Anticipated DC Date:  04/09/2014   Anticipated DC Plan:  [place]  CM consult      [facility] Choice  HOME HEALTH   Choice offered to / List presented to:  [name]   DME arranged  NA      DME agency  NA     [place]ita arranged  [place].   Status of service:  Completed, signed off Medicare Important Message given?   (If response is "NO", the following Medicare IM given date fields will be blank) Date Medicare IM given:   Medicare IM given by:   Date Additional Medicare IM given:   Additional Medicare IM given by:    Discharge Disposition:  [place]  Per UR Regulation:    If discussed at Long Length of Stay Meetings, dates discussed:    Comments:  04/09/13 11:00 CM met with pt in room to offer choice of home health agency. Pt chooses [facility] to render HHPT.   Pt has walker, cane and walking stick at home and does NOT need any DME.  Address and contact information verified with pt. Referral called to [facility] rep, Stephanie.  No other CM needs were communicated. Mariane Masters, BSN, [place].

## 2014-04-09 NOTE — Progress Notes (Signed)
Pt. discharged home given instruction IV and tele removed taken out via wheelchair. Instructions given to pt on cath site to the

## 2014-04-09 NOTE — Progress Notes (Signed)
Family Medicine Teaching Service Daily Progress Note Intern Pager: [phone]  Patient name: [name] Medical record number: [id number] Date of birth: 1935-10-12 Age: 79 y.o. Gender: male  Primary Care Provider: [name], MD Consultants: Cards Code Status: Full  Pt Overview and Major Events to Date:  1/6: Admitted for CP ruleout 1/8: Cardiac cath today  Assessment and Plan: HUBBERT LANDRIGAN is a 79 y.o. male presenting with chest pains x 3 days and 2 week history of falls. PMH is significant for CAD s/p CABG (1985, 1997, last cath 2009), Afib not on anticoag, HTN, HLD, GERD, Prostate cancer, OA, depression, ethanol use.  # Chest pain: pt with significant cardiac history. Vitals stable in ED. HEART score 6. EKG with stable abnormalities: afib, LBBB, TWI inferior leads, early repol. CXR showing atelectasis LLL. CMP with mild hyponatremia, BNP 206, CBC with hgb 11.3 and MCV 99.1. i-stat troponin 0.01. Cath with severe native 3 vessel disease, 2 patent grafts, one ocluded graft from SVD to diag. Ef 25%. - cycled troponins neg - daily ASA - cards consulted; cath per above, recommend medical management - f/u further recs today - echo with drop in LVEF 30-35%  # Afib: not on anticoagulation. Question with reported history of falls and ethanol use whether it would be beneficial vs risk of bleeding from fall - telemetry - continue metoprolol for rate control  #Back pain/ unsteady gait: No sciatic with back pain. Patient has history of falls so be precipitating pain.  - possible neruopathy component from long time alcohol usefrom alchol use  - DG lumbar with some degenerative changes; no need for surgery or MRI at this time - will continue to monitor - morphine 1mg  q2hrs PRN pain - PT/OT recs - HH PT - order placed  # HTN: currently normotensive (sytolic 676P/ diastolic 95K) - will hold home lisinopril, hctz and continue metoprolol  # HLD: - continue home atorvastatin  #  Hyponatremia: suspect secondary to chronic alcohol use, no history of withdrawals. - IVF  # Etoh use: Longstanding history of alcohol use, patient with alcohol level of 145 at admission - CIWA protocol - scores 0 - thiamine and folic acid continue  # GERD: stable, reports not using PPI recently. - continue PPI  # Pancytopenia: WBC, Hgb, and platelets low day prior to discharge. Is anemic at baseline, though previously had normal WBC and platelets. All trending down through out admission.  -will need follow-up of this at hospital f/u  FEN/GI: diet heart healthy Prophylaxis: heparin sq  Disposition: discharge today  Subjective:  Patient denies chest pain and dyspnea. Notes only complaint is back pain that is long standing. Denies issues with weakness and saddle anesthesia with this pain.  Objective: Temp:  [97.4 F (36.3 C)-98 F (36.7 C)] 97.4 F (36.3 C) (01/09 0554) Pulse Rate:  [51-76] 68 (01/09 0554) Resp:  [10-18] 18 (01/09 0554) BP: (108-143)/(44-105) 128/51 mmHg (01/09 0554) SpO2:  [96 %-99 %] 99 % (01/09 0554) Weight:  [168 lb 3.2 oz (76.295 kg)-168 lb 3.4 oz (76.3 kg)] 168 lb 3.2 oz (76.295 kg) (01/09 0554) Physical Exam: General: NAD, pleasant HEENT: PERRL, EOMI, MMM. Cardiovascular: irregularly irreg, normal s1s2, no murmur appreciated. Distal pulses present. Respiratory: clear bilaterally, normal effort Abdomen: soft, nontender, no rebound or guarding. Normal bowel sounds. Extremities: no edema or cyanosis, WWP.  Skin: no rashes noted Neuro: alert and oriented, no focal deficits  Laboratory: Results for orders placed or performed during the hospital encounter of 04/06/14 (  from the past 24 hour(s))  Protime-INR     Status: None   Collection Time: 04/08/14 12:45 PM  Result Value Ref Range   Prothrombin Time 13.6 11.6 - 15.2 seconds   INR 1.03 0.00 - 1.49  I-STAT 3, arterial blood gas (G3+)     Status: Abnormal   Collection Time: 04/08/14  2:28 PM  Result  Value Ref Range   pH, Arterial 7.418 7.350 - 7.450   pCO2 arterial 34.6 (L) 35.0 - 45.0 mmHg   pO2, Arterial 63.0 (L) 80.0 - 100.0 mmHg   Bicarbonate 22.4 20.0 - 24.0 mEq/L   TCO2 23 0 - 100 mmol/L   O2 Saturation 92.0 %   Acid-base deficit 2.0 0.0 - 2.0 mmol/L   Sample type ARTERIAL   I-STAT 3, venous blood gas (G3P V)     Status: Abnormal   Collection Time: 04/08/14  2:31 PM  Result Value Ref Range   pH, Ven 7.406 (H) 7.250 - 7.300   pCO2, Ven 39.9 (L) 45.0 - 50.0 mmHg   pO2, Ven 34.0 30.0 - 45.0 mmHg   Bicarbonate 25.1 (H) 20.0 - 24.0 mEq/L   TCO2 26 0 - 100 mmol/L   O2 Saturation 66.0 %   Sample type VENOUS    Comment NOTIFIED PHYSICIAN   CBC     Status: Abnormal   Collection Time: 04/08/14  4:20 PM  Result Value Ref Range   WBC 2.8 (L) 4.0 - 10.5 K/uL   RBC 3.03 (L) 4.22 - 5.81 MIL/uL   Hemoglobin 10.7 (L) 13.0 - 17.0 g/dL   HCT 30.7 (L) 39.0 - 52.0 %   MCV 101.3 (H) 78.0 - 100.0 fL   MCH 35.3 (H) 26.0 - 34.0 pg   MCHC 34.9 30.0 - 36.0 g/dL   RDW 13.5 11.5 - 15.5 %   Platelets 135 (L) 150 - 400 K/uL  Creatinine, serum     Status: Abnormal   Collection Time: 04/08/14  4:20 PM  Result Value Ref Range   Creatinine, Ser 0.79 0.50 - 1.35 mg/dL   GFR calc non Af Amer 85 (L) >90 mL/min   GFR calc Af Amer >90 >90 mL/min    Imaging/Diagnostic Tests: Dg Chest Port 1 View 04/06/2014 IMPRESSION: Borderline heart size. Atelectasis or fibrosis in the left lung base. No active consolidation.     No results found.  Leone Haven, MD 04/09/2014, 7:04 AM PGY-3, [place] Intern pager: [phone], text pages welcome

## 2014-04-09 NOTE — Evaluation (Signed)
Occupational Therapy Evaluation Patient Details Name: [name] MRN: [id number] DOB: [birth year] Today's Date: 04/09/2014    History of Present Illness [name] is a 79 y.o. male presenting with chest pains x 3 days and 2 week history of falls. PMH is significant for CAD s/p CABG (1985, 1997, last cath 2009), Afib not on anticoag, HTN, HLD, GERD, Prostate cancer, OA, depression, ethanol use.   Clinical Impression   Pt was performing ADL at a modified independent level prior to admission. He avoided showering due to fear of stepping over edge of tub.  He was reliant on his friend for housekeeping and meal prep. Pt presents with impaired balance and low back pain interfering with ability to perform ADL  He has decreased safety awareness.  Recommending HHOT to further address safety and DME needs. Will defer further OT to SNF.    Follow Up Recommendations  Home health OT    Equipment Recommendations  None recommended by OT    Recommendations for Other Services       Precautions / Restrictions Precautions Precautions: Fall      Mobility Bed Mobility               General bed mobility comments: pt up in chair  Transfers Overall transfer level: Needs assistance Equipment used: Rolling walker (2 wheeled) Transfers: Sit to/from Stand Sit to Stand: Min guard         General transfer comment: no physical or verbal cues, min guard due to some unsteadiness initially    Balance Overall balance assessment: History of Falls                                          ADL Overall ADL's : Needs assistance/impaired Eating/Feeding: Independent   Grooming: Wash/dry hands;Supervision/safety;Standing   Upper Body Bathing: Set up;Sitting   Lower Body Bathing: Min guard;Sit to/from stand   Upper Body Dressing : Set up;Sitting   Lower Body Dressing: Min guard;Sit to/from stand   Toilet Transfer: Min guard;Ambulation;Grab bars;Comfort height toilet    Toileting- Clothing Manipulation and Hygiene: Min guard;Sit to/from stand       Functional mobility during ADLs: Min guard;Rolling walker General ADL Comments: Recommended pt consider tub transfer bench.  Instructed pt in use of 3 in1 over toilet.  Recommended use of RW inside home and sitting at sink to sponge bathe rather than leaning against the wall in standing.     Vision                     Perception     Praxis      Pertinent Vitals/Pain Pain Assessment: Faces Faces Pain Scale: Hurts little more Pain Location: sacrum Pain Descriptors / Indicators: Sore Pain Intervention(s): Repositioned (placed pillow under )     Hand Dominance Right   Extremity/Trunk Assessment Upper Extremity Assessment Upper Extremity Assessment: Overall WFL for tasks assessed   Lower Extremity Assessment Lower Extremity Assessment: Defer to PT evaluation       Communication Communication Communication: No difficulties   Cognition Arousal/Alertness: Awake/alert Behavior During Therapy: Flat affect Overall Cognitive Status: No family/caregiver present to determine baseline cognitive functioning       Memory: Decreased short-term memory (could not recall how to use call bell)             General Comments  Exercises       Shoulder Instructions      Home Living Family/patient expects to be discharged to:: Private residence Living Arrangements: Non-relatives/Friends Available Help at Discharge: Friend(s) Type of Home: House Home Access: Stairs to enter [employer] of Steps: 1 Entrance Stairs-Rails: None Home Layout: One level     Bathroom Shower/Tub: Teacher, early years/pre: Standard     Home Equipment: Environmental consultant - 2 wheels;Bedside commode;Grab bars - tub/shower          Prior Functioning/Environment Level of Independence: Independent with assistive device(s)        Comments: furniture walked in house, walker outside of house,  sponge bathed at sink, friend does meal prep and housekeeping.    OT Diagnosis: Generalized weakness;Cognitive deficits;Acute pain   OT Problem List: Decreased strength;Decreased activity tolerance;Impaired balance (sitting and/or standing);Decreased cognition;Decreased safety awareness;Decreased knowledge of use of DME or AE;Pain   OT Treatment/Interventions:      OT Goals(Current goals can be found in the care plan section) Acute Rehab OT Goals Patient Stated Goal: to return home  OT Frequency:     Barriers to D/C:            Co-evaluation              End of Session Equipment Utilized During Treatment: Rolling walker  Activity Tolerance: Patient limited by pain Patient left: in chair;with call bell/phone within reach   Time: 1010-1031 OT Time Calculation (min): 21 min Charges:  OT General Charges $OT Visit: 1 Procedure OT Evaluation $Initial OT Evaluation Tier I: 1 Procedure OT Treatments $Self Care/Home Management : 8-22 mins G-Codes:    Malka So 04/09/2014, 10:32 AM  [phone]

## 2014-04-09 NOTE — Discharge Instructions (Signed)
You were admitted for chest pain. You had a cardiac catheterization for this that revealed one blocked graft. The cardiologist recommended medical management for this issue. You will need to follow-up with the cardiologist after discharge.   Chest Pain (Nonspecific) It is often hard to give a diagnosis for the cause of chest pain. There is always a chance that your pain could be related to something serious, such as a heart attack or a blood clot in the lungs. You need to follow up with your doctor. HOME CARE  If antibiotic medicine was given, take it as directed by your doctor. Finish the medicine even if you start to feel better.  For the next few days, avoid activities that bring on chest pain. Continue physical activities as told by your doctor.  Do not use any tobacco products. This includes cigarettes, chewing tobacco, and e-cigarettes.  Avoid drinking alcohol.  Only take medicine as told by your doctor.  Follow your doctor's suggestions for more testing if your chest pain does not go away.  Keep all doctor visits you made. GET HELP IF:  Your chest pain does not go away, even after treatment.  You have a rash with blisters on your chest.  You have a fever. GET HELP RIGHT AWAY IF:   You have more pain or pain that spreads to your arm, neck, jaw, back, or belly (abdomen).  You have shortness of breath.  You cough more than usual or cough up blood.  You have very bad back or belly pain.  You feel sick to your stomach (nauseous) or throw up (vomit).  You have very bad weakness.  You pass out (faint).  You have chills. This is an emergency. Do not wait to see if the problems will go away. Call your local emergency services (911 in [place] Do not drive yourself to the hospital. MAKE SURE YOU:   Understand these instructions.  Will watch your condition.  Will get help right away if you are not doing well or get worse. Document Released: 09/04/2007 Document Revised:  03/23/2013 Document Reviewed: 09/04/2007 [facility] Patient Information 2015 [place], [place]. This information is not intended to replace advice given to you by your health care provider. Make sure you discuss any questions you have with your health care provider.

## 2014-04-12 DIAGNOSIS — I4891 Unspecified atrial fibrillation: Secondary | ICD-10-CM | POA: Diagnosis not present

## 2014-04-12 DIAGNOSIS — I10 Essential (primary) hypertension: Secondary | ICD-10-CM | POA: Diagnosis not present

## 2014-04-12 DIAGNOSIS — E785 Hyperlipidemia, unspecified: Secondary | ICD-10-CM | POA: Diagnosis not present

## 2014-04-12 DIAGNOSIS — I2510 Atherosclerotic heart disease of native coronary artery without angina pectoris: Secondary | ICD-10-CM | POA: Diagnosis not present

## 2014-04-12 DIAGNOSIS — Z8546 Personal history of malignant neoplasm of prostate: Secondary | ICD-10-CM | POA: Diagnosis not present

## 2014-04-12 DIAGNOSIS — K219 Gastro-esophageal reflux disease without esophagitis: Secondary | ICD-10-CM | POA: Diagnosis not present

## 2014-04-12 DIAGNOSIS — E871 Hypo-osmolality and hyponatremia: Secondary | ICD-10-CM | POA: Diagnosis not present

## 2014-04-12 DIAGNOSIS — F1019 Alcohol abuse with unspecified alcohol-induced disorder: Secondary | ICD-10-CM | POA: Diagnosis not present

## 2014-04-13 ENCOUNTER — Telehealth: Payer: Self-pay | Admitting: *Deleted

## 2014-04-13 DIAGNOSIS — K219 Gastro-esophageal reflux disease without esophagitis: Secondary | ICD-10-CM | POA: Diagnosis not present

## 2014-04-13 DIAGNOSIS — I4891 Unspecified atrial fibrillation: Secondary | ICD-10-CM | POA: Diagnosis not present

## 2014-04-13 DIAGNOSIS — F1019 Alcohol abuse with unspecified alcohol-induced disorder: Secondary | ICD-10-CM | POA: Diagnosis not present

## 2014-04-13 DIAGNOSIS — I2510 Atherosclerotic heart disease of native coronary artery without angina pectoris: Secondary | ICD-10-CM | POA: Diagnosis not present

## 2014-04-13 DIAGNOSIS — E785 Hyperlipidemia, unspecified: Secondary | ICD-10-CM | POA: Diagnosis not present

## 2014-04-13 DIAGNOSIS — I10 Essential (primary) hypertension: Secondary | ICD-10-CM | POA: Diagnosis not present

## 2014-04-13 DIAGNOSIS — Z8546 Personal history of malignant neoplasm of prostate: Secondary | ICD-10-CM | POA: Diagnosis not present

## 2014-04-13 DIAGNOSIS — E871 Hypo-osmolality and hyponatremia: Secondary | ICD-10-CM | POA: Diagnosis not present

## 2014-04-13 NOTE — Telephone Encounter (Signed)
Returned call to [place]. Gave verbal order for RN for medication management.

## 2014-04-13 NOTE — Telephone Encounter (Signed)
[name]., Physical Therapist with [facility] called to request verbal order for skilled nursing for medication management.  Pt had evaluation in and is having trouble identifying what the medications are for and when to take the medication.  Please give her a call at [phone].  Derl Barrow, RN

## 2014-04-14 DIAGNOSIS — Z8546 Personal history of malignant neoplasm of prostate: Secondary | ICD-10-CM | POA: Diagnosis not present

## 2014-04-14 DIAGNOSIS — K219 Gastro-esophageal reflux disease without esophagitis: Secondary | ICD-10-CM | POA: Diagnosis not present

## 2014-04-14 DIAGNOSIS — E785 Hyperlipidemia, unspecified: Secondary | ICD-10-CM | POA: Diagnosis not present

## 2014-04-14 DIAGNOSIS — I4891 Unspecified atrial fibrillation: Secondary | ICD-10-CM | POA: Diagnosis not present

## 2014-04-14 DIAGNOSIS — F1019 Alcohol abuse with unspecified alcohol-induced disorder: Secondary | ICD-10-CM | POA: Diagnosis not present

## 2014-04-14 DIAGNOSIS — I10 Essential (primary) hypertension: Secondary | ICD-10-CM | POA: Diagnosis not present

## 2014-04-14 DIAGNOSIS — I2510 Atherosclerotic heart disease of native coronary artery without angina pectoris: Secondary | ICD-10-CM | POA: Diagnosis not present

## 2014-04-14 DIAGNOSIS — E871 Hypo-osmolality and hyponatremia: Secondary | ICD-10-CM | POA: Diagnosis not present

## 2014-04-15 ENCOUNTER — Telehealth: Payer: Self-pay | Admitting: Family Medicine

## 2014-04-15 DIAGNOSIS — I10 Essential (primary) hypertension: Secondary | ICD-10-CM | POA: Diagnosis not present

## 2014-04-15 DIAGNOSIS — Z8546 Personal history of malignant neoplasm of prostate: Secondary | ICD-10-CM | POA: Diagnosis not present

## 2014-04-15 DIAGNOSIS — E871 Hypo-osmolality and hyponatremia: Secondary | ICD-10-CM | POA: Diagnosis not present

## 2014-04-15 DIAGNOSIS — I2510 Atherosclerotic heart disease of native coronary artery without angina pectoris: Secondary | ICD-10-CM | POA: Diagnosis not present

## 2014-04-15 DIAGNOSIS — F1019 Alcohol abuse with unspecified alcohol-induced disorder: Secondary | ICD-10-CM | POA: Diagnosis not present

## 2014-04-15 DIAGNOSIS — I4891 Unspecified atrial fibrillation: Secondary | ICD-10-CM | POA: Diagnosis not present

## 2014-04-15 DIAGNOSIS — E785 Hyperlipidemia, unspecified: Secondary | ICD-10-CM | POA: Diagnosis not present

## 2014-04-15 DIAGNOSIS — K219 Gastro-esophageal reflux disease without esophagitis: Secondary | ICD-10-CM | POA: Diagnosis not present

## 2014-04-15 NOTE — Telephone Encounter (Signed)
Provided verbal order last week, however faxed new order today.

## 2014-04-15 NOTE — Telephone Encounter (Signed)
Need verbal or written order to provide medication,pain and fall prevention mngt.  Patient unaware of what medication he's been taking.  Can fax order to [phone].

## 2014-04-18 DIAGNOSIS — Z8546 Personal history of malignant neoplasm of prostate: Secondary | ICD-10-CM | POA: Diagnosis not present

## 2014-04-18 DIAGNOSIS — E785 Hyperlipidemia, unspecified: Secondary | ICD-10-CM | POA: Diagnosis not present

## 2014-04-18 DIAGNOSIS — I4891 Unspecified atrial fibrillation: Secondary | ICD-10-CM | POA: Diagnosis not present

## 2014-04-18 DIAGNOSIS — E871 Hypo-osmolality and hyponatremia: Secondary | ICD-10-CM | POA: Diagnosis not present

## 2014-04-18 DIAGNOSIS — K219 Gastro-esophageal reflux disease without esophagitis: Secondary | ICD-10-CM | POA: Diagnosis not present

## 2014-04-18 DIAGNOSIS — I10 Essential (primary) hypertension: Secondary | ICD-10-CM | POA: Diagnosis not present

## 2014-04-18 DIAGNOSIS — F1019 Alcohol abuse with unspecified alcohol-induced disorder: Secondary | ICD-10-CM | POA: Diagnosis not present

## 2014-04-18 DIAGNOSIS — I2510 Atherosclerotic heart disease of native coronary artery without angina pectoris: Secondary | ICD-10-CM | POA: Diagnosis not present

## 2014-04-19 ENCOUNTER — Ambulatory Visit: Payer: Self-pay | Admitting: Family Medicine

## 2014-04-21 DIAGNOSIS — Z8546 Personal history of malignant neoplasm of prostate: Secondary | ICD-10-CM | POA: Diagnosis not present

## 2014-04-21 DIAGNOSIS — F1019 Alcohol abuse with unspecified alcohol-induced disorder: Secondary | ICD-10-CM | POA: Diagnosis not present

## 2014-04-21 DIAGNOSIS — E785 Hyperlipidemia, unspecified: Secondary | ICD-10-CM | POA: Diagnosis not present

## 2014-04-21 DIAGNOSIS — K219 Gastro-esophageal reflux disease without esophagitis: Secondary | ICD-10-CM | POA: Diagnosis not present

## 2014-04-21 DIAGNOSIS — I4891 Unspecified atrial fibrillation: Secondary | ICD-10-CM | POA: Diagnosis not present

## 2014-04-21 DIAGNOSIS — I10 Essential (primary) hypertension: Secondary | ICD-10-CM | POA: Diagnosis not present

## 2014-04-21 DIAGNOSIS — I2510 Atherosclerotic heart disease of native coronary artery without angina pectoris: Secondary | ICD-10-CM | POA: Diagnosis not present

## 2014-04-21 DIAGNOSIS — E871 Hypo-osmolality and hyponatremia: Secondary | ICD-10-CM | POA: Diagnosis not present

## 2014-04-25 DIAGNOSIS — Z8546 Personal history of malignant neoplasm of prostate: Secondary | ICD-10-CM | POA: Diagnosis not present

## 2014-04-25 DIAGNOSIS — K219 Gastro-esophageal reflux disease without esophagitis: Secondary | ICD-10-CM | POA: Diagnosis not present

## 2014-04-25 DIAGNOSIS — I2510 Atherosclerotic heart disease of native coronary artery without angina pectoris: Secondary | ICD-10-CM | POA: Diagnosis not present

## 2014-04-25 DIAGNOSIS — F1019 Alcohol abuse with unspecified alcohol-induced disorder: Secondary | ICD-10-CM | POA: Diagnosis not present

## 2014-04-25 DIAGNOSIS — E785 Hyperlipidemia, unspecified: Secondary | ICD-10-CM | POA: Diagnosis not present

## 2014-04-25 DIAGNOSIS — E871 Hypo-osmolality and hyponatremia: Secondary | ICD-10-CM | POA: Diagnosis not present

## 2014-04-25 DIAGNOSIS — I4891 Unspecified atrial fibrillation: Secondary | ICD-10-CM | POA: Diagnosis not present

## 2014-04-25 DIAGNOSIS — I10 Essential (primary) hypertension: Secondary | ICD-10-CM | POA: Diagnosis not present

## 2014-04-26 ENCOUNTER — Ambulatory Visit: Payer: Self-pay | Admitting: Family Medicine

## 2014-04-27 DIAGNOSIS — I4891 Unspecified atrial fibrillation: Secondary | ICD-10-CM | POA: Diagnosis not present

## 2014-04-27 DIAGNOSIS — E871 Hypo-osmolality and hyponatremia: Secondary | ICD-10-CM | POA: Diagnosis not present

## 2014-04-27 DIAGNOSIS — I2510 Atherosclerotic heart disease of native coronary artery without angina pectoris: Secondary | ICD-10-CM | POA: Diagnosis not present

## 2014-04-27 DIAGNOSIS — F1019 Alcohol abuse with unspecified alcohol-induced disorder: Secondary | ICD-10-CM | POA: Diagnosis not present

## 2014-04-27 DIAGNOSIS — I10 Essential (primary) hypertension: Secondary | ICD-10-CM | POA: Diagnosis not present

## 2014-04-27 DIAGNOSIS — K219 Gastro-esophageal reflux disease without esophagitis: Secondary | ICD-10-CM | POA: Diagnosis not present

## 2014-04-27 DIAGNOSIS — Z8546 Personal history of malignant neoplasm of prostate: Secondary | ICD-10-CM | POA: Diagnosis not present

## 2014-04-27 DIAGNOSIS — E785 Hyperlipidemia, unspecified: Secondary | ICD-10-CM | POA: Diagnosis not present

## 2014-04-28 DIAGNOSIS — E785 Hyperlipidemia, unspecified: Secondary | ICD-10-CM | POA: Diagnosis not present

## 2014-04-28 DIAGNOSIS — F1019 Alcohol abuse with unspecified alcohol-induced disorder: Secondary | ICD-10-CM | POA: Diagnosis not present

## 2014-04-28 DIAGNOSIS — I4891 Unspecified atrial fibrillation: Secondary | ICD-10-CM | POA: Diagnosis not present

## 2014-04-28 DIAGNOSIS — E871 Hypo-osmolality and hyponatremia: Secondary | ICD-10-CM | POA: Diagnosis not present

## 2014-04-28 DIAGNOSIS — I10 Essential (primary) hypertension: Secondary | ICD-10-CM | POA: Diagnosis not present

## 2014-04-28 DIAGNOSIS — Z8546 Personal history of malignant neoplasm of prostate: Secondary | ICD-10-CM | POA: Diagnosis not present

## 2014-04-28 DIAGNOSIS — I2510 Atherosclerotic heart disease of native coronary artery without angina pectoris: Secondary | ICD-10-CM | POA: Diagnosis not present

## 2014-04-28 DIAGNOSIS — K219 Gastro-esophageal reflux disease without esophagitis: Secondary | ICD-10-CM | POA: Diagnosis not present

## 2014-05-04 DIAGNOSIS — E871 Hypo-osmolality and hyponatremia: Secondary | ICD-10-CM | POA: Diagnosis not present

## 2014-05-04 DIAGNOSIS — I4891 Unspecified atrial fibrillation: Secondary | ICD-10-CM | POA: Diagnosis not present

## 2014-05-04 DIAGNOSIS — E785 Hyperlipidemia, unspecified: Secondary | ICD-10-CM | POA: Diagnosis not present

## 2014-05-04 DIAGNOSIS — F1019 Alcohol abuse with unspecified alcohol-induced disorder: Secondary | ICD-10-CM | POA: Diagnosis not present

## 2014-05-04 DIAGNOSIS — I10 Essential (primary) hypertension: Secondary | ICD-10-CM | POA: Diagnosis not present

## 2014-05-04 DIAGNOSIS — Z8546 Personal history of malignant neoplasm of prostate: Secondary | ICD-10-CM | POA: Diagnosis not present

## 2014-05-04 DIAGNOSIS — I2510 Atherosclerotic heart disease of native coronary artery without angina pectoris: Secondary | ICD-10-CM | POA: Diagnosis not present

## 2014-05-04 DIAGNOSIS — K219 Gastro-esophageal reflux disease without esophagitis: Secondary | ICD-10-CM | POA: Diagnosis not present

## 2014-05-09 ENCOUNTER — Ambulatory Visit (INDEPENDENT_AMBULATORY_CARE_PROVIDER_SITE_OTHER): Payer: Medicare Other | Admitting: Family Medicine

## 2014-05-09 ENCOUNTER — Encounter: Payer: Self-pay | Admitting: Family Medicine

## 2014-05-09 VITALS — BP 124/67 | HR 75 | Temp 98.2°F | Ht 71.0 in | Wt 165.4 lb

## 2014-05-09 DIAGNOSIS — E785 Hyperlipidemia, unspecified: Secondary | ICD-10-CM | POA: Diagnosis not present

## 2014-05-09 DIAGNOSIS — Z23 Encounter for immunization: Secondary | ICD-10-CM

## 2014-05-09 DIAGNOSIS — I2510 Atherosclerotic heart disease of native coronary artery without angina pectoris: Secondary | ICD-10-CM | POA: Diagnosis not present

## 2014-05-09 DIAGNOSIS — K219 Gastro-esophageal reflux disease without esophagitis: Secondary | ICD-10-CM | POA: Diagnosis not present

## 2014-05-09 DIAGNOSIS — F1020 Alcohol dependence, uncomplicated: Secondary | ICD-10-CM | POA: Diagnosis not present

## 2014-05-09 DIAGNOSIS — E871 Hypo-osmolality and hyponatremia: Secondary | ICD-10-CM

## 2014-05-09 MED ORDER — ATORVASTATIN CALCIUM 40 MG PO TABS: 40.0000 mg | ORAL_TABLET | Freq: Every day | ORAL | Status: DC

## 2014-05-09 NOTE — Patient Instructions (Signed)
It was nice to see you today.  My nurse will give you a flu shot today  Refill of Lipitor provided  Stop omeprazole (for acid reflux).   Return in 6 months for yearly physical.

## 2014-05-09 NOTE — Assessment & Plan Note (Signed)
Stable based on most recent lab work

## 2014-05-09 NOTE — Assessment & Plan Note (Signed)
Stable, no recent symptoms while off omeprazole. -stop omeprazole (removed from medication list)

## 2014-05-09 NOTE — Assessment & Plan Note (Signed)
Patient reports no longer drinking beer, still drinks 1-2 glasses of wine per night -patient congratulated on cutting down on drinking -continue to monitor

## 2014-05-09 NOTE — Assessment & Plan Note (Addendum)
Stable, no recent chest pain -continue asa and lipitor (refill provided)

## 2014-05-09 NOTE — Progress Notes (Signed)
   Subjective:    Patient ID: [name], male    DOB: [birth year], 79 y.o.   MRN: [id number]  HPI 79 y/o male presents for routine check.   CAD - no recent check pain, has not needed nitroglycerin, taking asa daily  HLD - has been out of Lipitor, requests refill  Acid Reflux - controlled, has not been taking omeprazole for some time, would like to stop this medications  Alcohol abuse - reports no longer drinking beer, will still drink wine 1-2 glasses per night after dinner, denies further loc/fall/syncope  Social - lives alone, [facility] nurse coming to home  Review of Systems  Constitutional: Negative for chills and fatigue.  Respiratory: Negative for cough and shortness of breath.   Cardiovascular: Negative for chest pain.  Gastrointestinal: Negative for nausea, vomiting and diarrhea.       Objective:   Physical Exam Vitals: reviewed Gen: pleasant male, NAD Cardiac: RRR, S1 and S2 present, no murmur Resp: CTAB, normal effort Abd: soft, no tenderness, normal bowel sounds Ext: no edema     Assessment & Plan:  Please see problem specific assessment and plan.   Flu shot provided

## 2014-05-11 ENCOUNTER — Telehealth: Payer: Self-pay | Admitting: Family Medicine

## 2014-05-11 DIAGNOSIS — I2510 Atherosclerotic heart disease of native coronary artery without angina pectoris: Secondary | ICD-10-CM | POA: Diagnosis not present

## 2014-05-11 DIAGNOSIS — F1019 Alcohol abuse with unspecified alcohol-induced disorder: Secondary | ICD-10-CM | POA: Diagnosis not present

## 2014-05-11 DIAGNOSIS — Z8546 Personal history of malignant neoplasm of prostate: Secondary | ICD-10-CM | POA: Diagnosis not present

## 2014-05-11 DIAGNOSIS — E871 Hypo-osmolality and hyponatremia: Secondary | ICD-10-CM | POA: Diagnosis not present

## 2014-05-11 DIAGNOSIS — I4891 Unspecified atrial fibrillation: Secondary | ICD-10-CM | POA: Diagnosis not present

## 2014-05-11 DIAGNOSIS — I10 Essential (primary) hypertension: Secondary | ICD-10-CM | POA: Diagnosis not present

## 2014-05-11 DIAGNOSIS — K219 Gastro-esophageal reflux disease without esophagitis: Secondary | ICD-10-CM | POA: Diagnosis not present

## 2014-05-11 DIAGNOSIS — E785 Hyperlipidemia, unspecified: Secondary | ICD-10-CM | POA: Diagnosis not present

## 2014-05-11 NOTE — Telephone Encounter (Signed)
Cora called from [facility] and they are taking care of the patient. She would like to speak to a nurse and get a current med list for him. She also reported that his BP standing was 110/50 and HR 51. Sitting his BP was 128/50 and HR 48 and irregular. Please call her at [phone]. jw

## 2014-05-11 NOTE — Telephone Encounter (Signed)
FYI to PCP. Current med list faxed to [facility] at [phone].

## 2014-05-11 NOTE — Telephone Encounter (Signed)
Attempted to call Cora with [facility], left voicemail stating that I will be available until 5 pm this evening if she needs to discuss [name].  I also spoke to [name] via telephone, he reports no complaints, no chest pain or sob, states that he feels well.

## 2014-05-12 NOTE — Telephone Encounter (Signed)
Spoke to [place] with [facility]. Discussed current medications for Mr. Wollman. Gave verbal for pill box.

## 2014-05-12 NOTE — Telephone Encounter (Signed)
[name] is returning Dr. Nedra Hai call. Please call her back concerning patient [phone]. jw

## 2014-05-16 DIAGNOSIS — E785 Hyperlipidemia, unspecified: Secondary | ICD-10-CM | POA: Diagnosis not present

## 2014-05-16 DIAGNOSIS — F1019 Alcohol abuse with unspecified alcohol-induced disorder: Secondary | ICD-10-CM | POA: Diagnosis not present

## 2014-05-16 DIAGNOSIS — Z8546 Personal history of malignant neoplasm of prostate: Secondary | ICD-10-CM | POA: Diagnosis not present

## 2014-05-16 DIAGNOSIS — E871 Hypo-osmolality and hyponatremia: Secondary | ICD-10-CM | POA: Diagnosis not present

## 2014-05-16 DIAGNOSIS — I10 Essential (primary) hypertension: Secondary | ICD-10-CM | POA: Diagnosis not present

## 2014-05-16 DIAGNOSIS — I2510 Atherosclerotic heart disease of native coronary artery without angina pectoris: Secondary | ICD-10-CM | POA: Diagnosis not present

## 2014-05-16 DIAGNOSIS — I4891 Unspecified atrial fibrillation: Secondary | ICD-10-CM | POA: Diagnosis not present

## 2014-05-16 DIAGNOSIS — K219 Gastro-esophageal reflux disease without esophagitis: Secondary | ICD-10-CM | POA: Diagnosis not present

## 2014-05-18 DIAGNOSIS — I4891 Unspecified atrial fibrillation: Secondary | ICD-10-CM | POA: Diagnosis not present

## 2014-05-18 DIAGNOSIS — I10 Essential (primary) hypertension: Secondary | ICD-10-CM | POA: Diagnosis not present

## 2014-05-18 DIAGNOSIS — E871 Hypo-osmolality and hyponatremia: Secondary | ICD-10-CM | POA: Diagnosis not present

## 2014-05-18 DIAGNOSIS — I2510 Atherosclerotic heart disease of native coronary artery without angina pectoris: Secondary | ICD-10-CM | POA: Diagnosis not present

## 2014-05-18 DIAGNOSIS — Z8546 Personal history of malignant neoplasm of prostate: Secondary | ICD-10-CM | POA: Diagnosis not present

## 2014-05-18 DIAGNOSIS — K219 Gastro-esophageal reflux disease without esophagitis: Secondary | ICD-10-CM | POA: Diagnosis not present

## 2014-05-18 DIAGNOSIS — F1019 Alcohol abuse with unspecified alcohol-induced disorder: Secondary | ICD-10-CM | POA: Diagnosis not present

## 2014-05-18 DIAGNOSIS — E785 Hyperlipidemia, unspecified: Secondary | ICD-10-CM | POA: Diagnosis not present

## 2014-05-25 ENCOUNTER — Telehealth: Payer: Self-pay | Admitting: Family Medicine

## 2014-05-25 DIAGNOSIS — E785 Hyperlipidemia, unspecified: Secondary | ICD-10-CM | POA: Diagnosis not present

## 2014-05-25 DIAGNOSIS — I4891 Unspecified atrial fibrillation: Secondary | ICD-10-CM | POA: Diagnosis not present

## 2014-05-25 DIAGNOSIS — E871 Hypo-osmolality and hyponatremia: Secondary | ICD-10-CM | POA: Diagnosis not present

## 2014-05-25 DIAGNOSIS — F1019 Alcohol abuse with unspecified alcohol-induced disorder: Secondary | ICD-10-CM | POA: Diagnosis not present

## 2014-05-25 DIAGNOSIS — Z8546 Personal history of malignant neoplasm of prostate: Secondary | ICD-10-CM | POA: Diagnosis not present

## 2014-05-25 DIAGNOSIS — K219 Gastro-esophageal reflux disease without esophagitis: Secondary | ICD-10-CM | POA: Diagnosis not present

## 2014-05-25 DIAGNOSIS — I10 Essential (primary) hypertension: Secondary | ICD-10-CM | POA: Diagnosis not present

## 2014-05-25 DIAGNOSIS — I2510 Atherosclerotic heart disease of native coronary artery without angina pectoris: Secondary | ICD-10-CM | POA: Diagnosis not present

## 2014-05-25 NOTE — Telephone Encounter (Signed)
Nurse from [facility] called to report that she's discharging services today for [name].  This is her last visit.

## 2014-05-25 NOTE — Telephone Encounter (Signed)
Below noted.  

## 2014-06-13 DIAGNOSIS — R3914 Feeling of incomplete bladder emptying: Secondary | ICD-10-CM | POA: Diagnosis not present

## 2014-06-13 DIAGNOSIS — N5201 Erectile dysfunction due to arterial insufficiency: Secondary | ICD-10-CM | POA: Diagnosis not present

## 2014-06-13 DIAGNOSIS — N390 Urinary tract infection, site not specified: Secondary | ICD-10-CM | POA: Diagnosis not present

## 2014-06-13 DIAGNOSIS — Z8546 Personal history of malignant neoplasm of prostate: Secondary | ICD-10-CM | POA: Diagnosis not present

## 2014-06-14 ENCOUNTER — Other Ambulatory Visit: Payer: Self-pay | Admitting: Family Medicine

## 2014-06-23 ENCOUNTER — Other Ambulatory Visit: Payer: Self-pay | Admitting: Family Medicine

## 2014-06-23 MED ORDER — HYDROCHLOROTHIAZIDE 25 MG PO TABS: 25.0000 mg | ORAL_TABLET | Freq: Every day | ORAL | Status: DC

## 2014-06-23 NOTE — Telephone Encounter (Signed)
It looks like it's been some time since this was last filled.  I will send in for 1 month's worth.  The patient should follow up with his PCP after that.

## 2014-06-23 NOTE — Telephone Encounter (Signed)
Pt called and would like a refill on his hydrochlorothiazide. jw

## 2014-07-19 ENCOUNTER — Encounter: Payer: Self-pay | Admitting: Family Medicine

## 2014-08-09 ENCOUNTER — Other Ambulatory Visit: Payer: Self-pay | Admitting: Family Medicine

## 2014-09-05 ENCOUNTER — Other Ambulatory Visit: Payer: Self-pay | Admitting: *Deleted

## 2014-09-05 MED ORDER — FERROUS SULFATE 325 (65 FE) MG PO TABS: ORAL_TABLET | ORAL | Status: DC

## 2014-09-26 ENCOUNTER — Other Ambulatory Visit: Payer: Self-pay | Admitting: Family Medicine

## 2014-10-17 IMAGING — CR DG CHEST 1V PORT
1 series · 1 of 1 positions shown · non-contrast
Comparison: 11/13/2011

CLINICAL DATA: Shortness of breath, weakness.

PORTABLE CHEST - 1 VIEW

[AP]
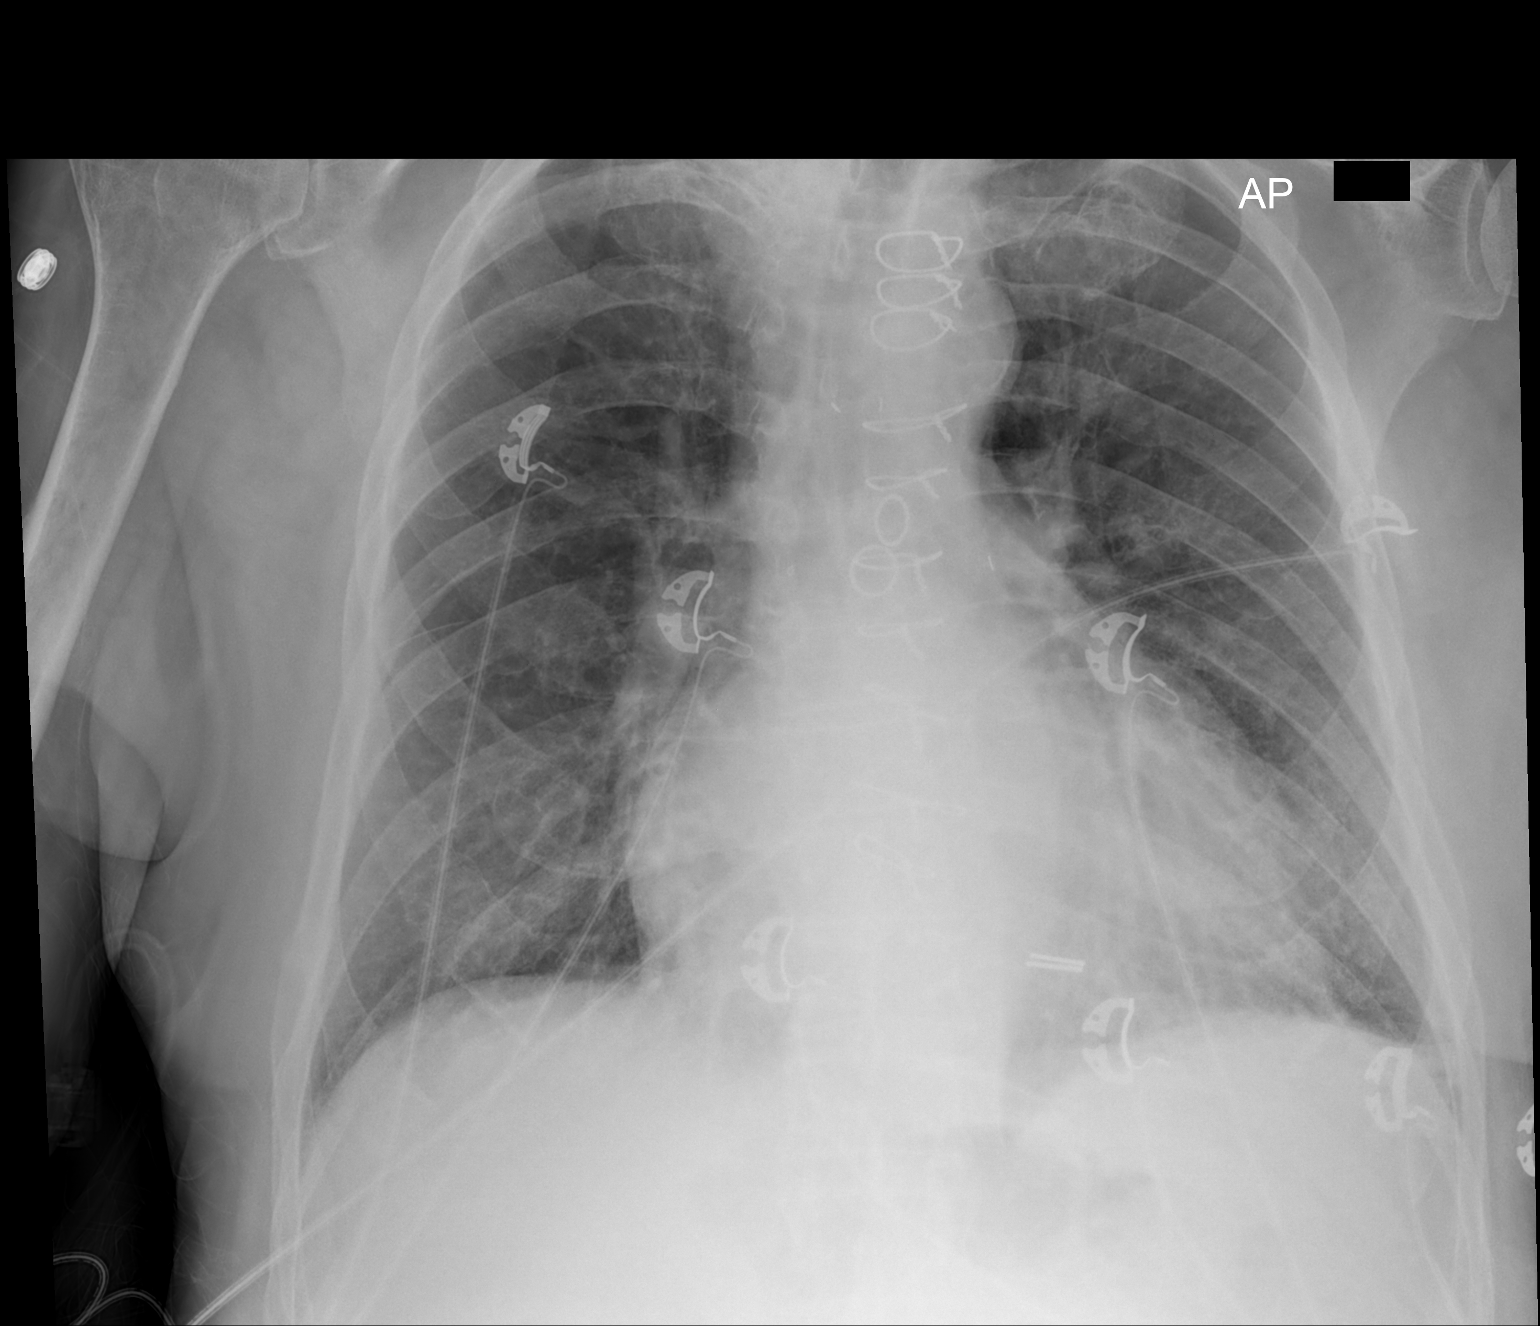

[1 of 1 positions shown; findings below may reference images not displayed]

FINDINGS: Previous CABG.  Stable mild cardiomegaly.  Lungs clear.
No effusion.  Atheromatous aortic arch.
IMPRESSION: 1.  No acute disease post CABG.  Stable mild cardiomegaly.

## 2014-10-28 IMAGING — CR DG CHEST 1V PORT
1 series · 1 of 1 positions shown · non-contrast
Comparison: 09/12/2012 and earlier.

CLINICAL DATA: 76-year-old male with chest pain weakness.

PORTABLE CHEST - 1 VIEW

[AP]
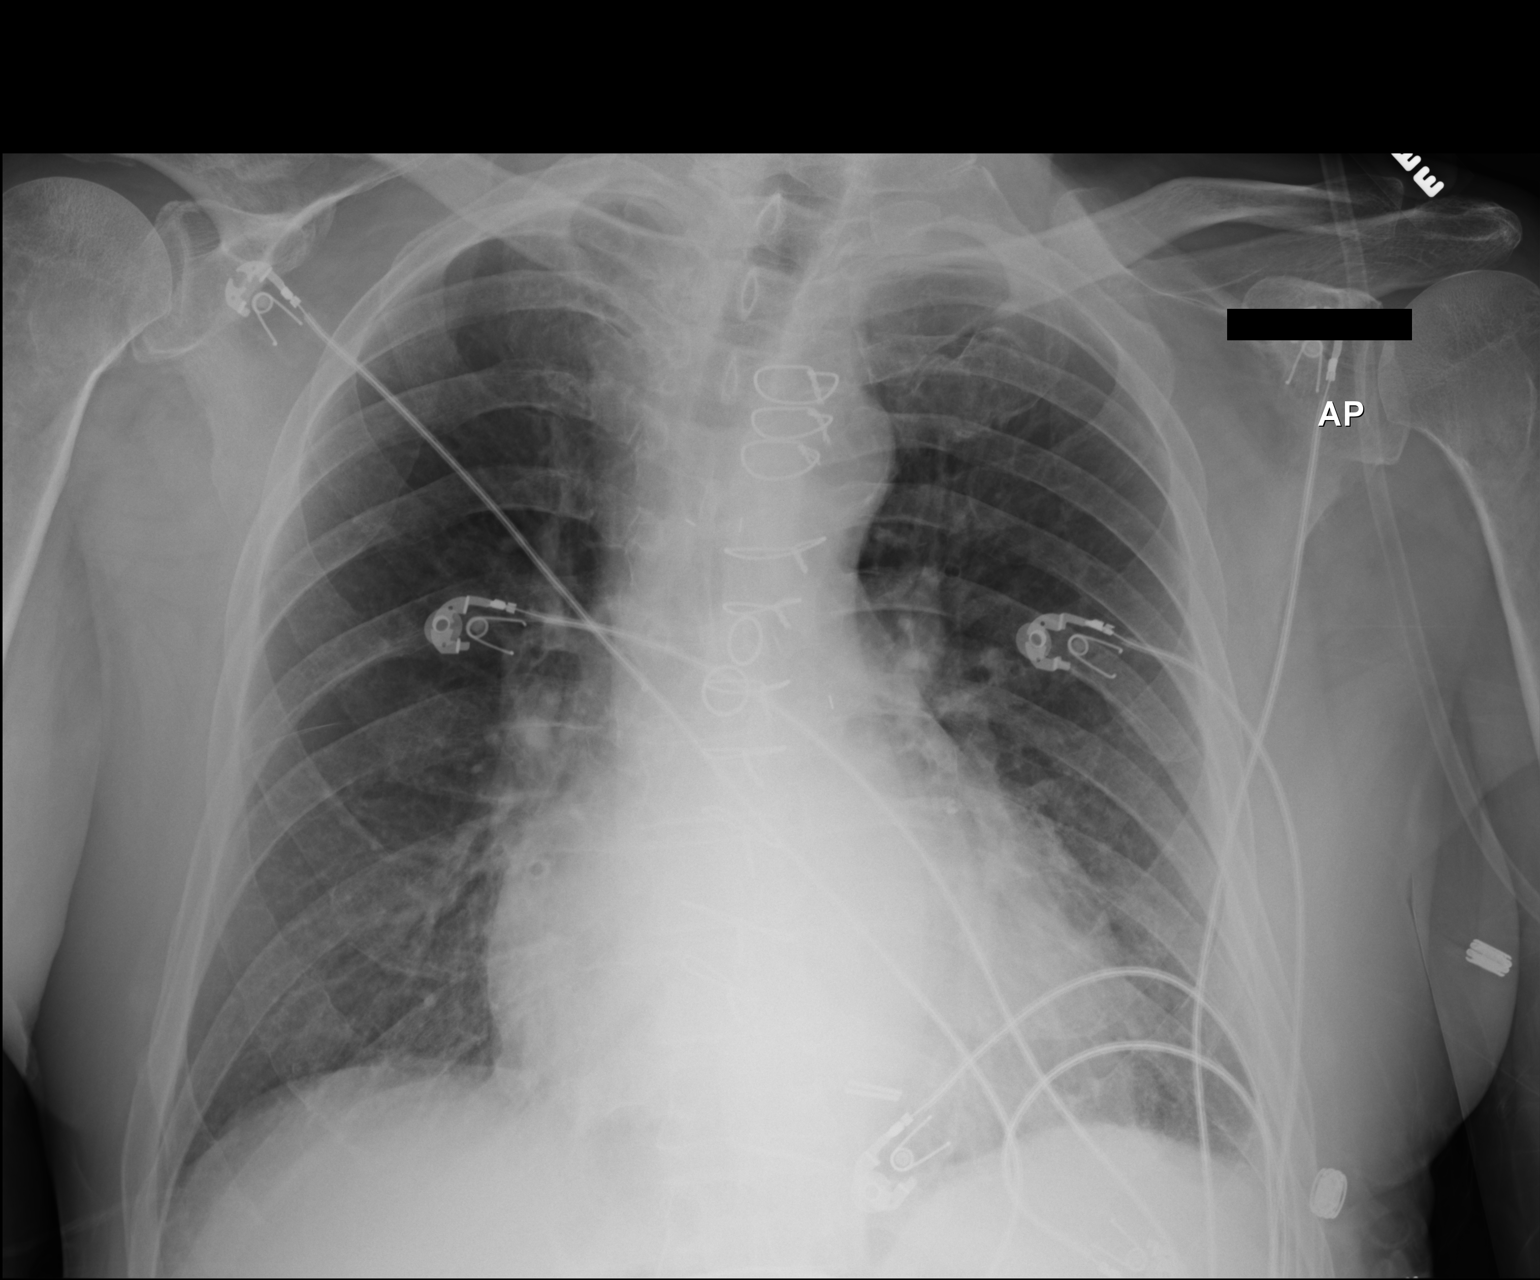

[1 of 1 positions shown; findings below may reference images not displayed]

FINDINGS: Portable AP view at 6904 hours.  Stable lung volumes.
Stable cardiac size and mediastinal contours.  Stable sequelae of
CABG. Visualized tracheal air column is within normal limits.  No
pneumothorax, pulmonary edema, pleural effusion or confluent
pulmonary opacity.  Cervical carotid calcified atherosclerosis on
the left.
IMPRESSION: No acute cardiopulmonary abnormality.

## 2014-11-02 ENCOUNTER — Other Ambulatory Visit: Payer: Self-pay | Admitting: Family Medicine

## 2015-01-14 ENCOUNTER — Encounter (HOSPITAL_COMMUNITY): Payer: Self-pay | Admitting: Emergency Medicine

## 2015-01-14 ENCOUNTER — Observation Stay (HOSPITAL_COMMUNITY)
Admission: EM | Admit: 2015-01-14 | Discharge: 2015-01-15 | Disposition: A | Discharge status: Home / Self Care | Payer: Medicare Other | Attending: Family Medicine | Admitting: Family Medicine

## 2015-01-14 DIAGNOSIS — S12300A Unspecified displaced fracture of fourth cervical vertebra, initial encounter for closed fracture: Secondary | ICD-10-CM | POA: Diagnosis not present

## 2015-01-14 DIAGNOSIS — W19XXXA Unspecified fall, initial encounter: Secondary | ICD-10-CM | POA: Insufficient documentation

## 2015-01-14 DIAGNOSIS — I10 Essential (primary) hypertension: Secondary | ICD-10-CM | POA: Diagnosis not present

## 2015-01-14 DIAGNOSIS — Y908 Blood alcohol level of 240 mg/100 ml or more: Secondary | ICD-10-CM | POA: Diagnosis not present

## 2015-01-14 DIAGNOSIS — I2510 Atherosclerotic heart disease of native coronary artery without angina pectoris: Secondary | ICD-10-CM | POA: Insufficient documentation

## 2015-01-14 DIAGNOSIS — S129XXA Fracture of neck, unspecified, initial encounter: Secondary | ICD-10-CM | POA: Insufficient documentation

## 2015-01-14 DIAGNOSIS — E785 Hyperlipidemia, unspecified: Secondary | ICD-10-CM | POA: Insufficient documentation

## 2015-01-14 DIAGNOSIS — I252 Old myocardial infarction: Secondary | ICD-10-CM | POA: Diagnosis not present

## 2015-01-14 DIAGNOSIS — I4891 Unspecified atrial fibrillation: Secondary | ICD-10-CM | POA: Insufficient documentation

## 2015-01-14 DIAGNOSIS — I5022 Chronic systolic (congestive) heart failure: Secondary | ICD-10-CM | POA: Insufficient documentation

## 2015-01-14 DIAGNOSIS — Z87891 Personal history of nicotine dependence: Secondary | ICD-10-CM | POA: Insufficient documentation

## 2015-01-14 DIAGNOSIS — Z951 Presence of aortocoronary bypass graft: Secondary | ICD-10-CM | POA: Insufficient documentation

## 2015-01-14 DIAGNOSIS — E871 Hypo-osmolality and hyponatremia: Secondary | ICD-10-CM

## 2015-01-14 DIAGNOSIS — F10129 Alcohol abuse with intoxication, unspecified: Secondary | ICD-10-CM | POA: Diagnosis not present

## 2015-01-14 DIAGNOSIS — K219 Gastro-esophageal reflux disease without esophagitis: Secondary | ICD-10-CM | POA: Insufficient documentation

## 2015-01-14 DIAGNOSIS — C61 Malignant neoplasm of prostate: Secondary | ICD-10-CM | POA: Diagnosis not present

## 2015-01-14 DIAGNOSIS — IMO0002 Reserved for concepts with insufficient information to code with codable children: Secondary | ICD-10-CM

## 2015-01-14 DIAGNOSIS — E876 Hypokalemia: Secondary | ICD-10-CM | POA: Diagnosis not present

## 2015-01-14 DIAGNOSIS — Z7982 Long term (current) use of aspirin: Secondary | ICD-10-CM | POA: Insufficient documentation

## 2015-01-14 NOTE — ED Notes (Signed)
According to EMS pt has been drinking tonight and went to stand up out of the recliner when he fell forward onto the carpet.  While he did hit his head on the floor he did not experience any LOC.  He did vomit once initially but states he does not feel nauseas.  He is on blood thinners.  He has a tear on his left elbow.  Upon arrival his O2 stats were 92 on room air, he was placed on 2L at which time his O2 became 98%.  EMS states he was alert and oriented but he does not rememer how he got to the ED.  He is not oriented to date.
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
## 2015-01-15 ENCOUNTER — Other Ambulatory Visit: Payer: Self-pay

## 2015-01-15 ENCOUNTER — Emergency Department (HOSPITAL_COMMUNITY): Payer: Medicare Other

## 2015-01-15 ENCOUNTER — Encounter (HOSPITAL_COMMUNITY): Payer: Self-pay

## 2015-01-15 DIAGNOSIS — E871 Hypo-osmolality and hyponatremia: Secondary | ICD-10-CM | POA: Diagnosis not present

## 2015-01-15 DIAGNOSIS — F10129 Alcohol abuse with intoxication, unspecified: Secondary | ICD-10-CM | POA: Insufficient documentation

## 2015-01-15 DIAGNOSIS — I5022 Chronic systolic (congestive) heart failure: Secondary | ICD-10-CM | POA: Insufficient documentation

## 2015-01-15 DIAGNOSIS — I2510 Atherosclerotic heart disease of native coronary artery without angina pectoris: Secondary | ICD-10-CM

## 2015-01-15 DIAGNOSIS — E876 Hypokalemia: Secondary | ICD-10-CM

## 2015-01-15 DIAGNOSIS — S129XXA Fracture of neck, unspecified, initial encounter: Secondary | ICD-10-CM | POA: Insufficient documentation

## 2015-01-15 DIAGNOSIS — S12300A Unspecified displaced fracture of fourth cervical vertebra, initial encounter for closed fracture: Secondary | ICD-10-CM | POA: Diagnosis not present

## 2015-01-15 LAB — COMPREHENSIVE METABOLIC PANEL
ALBUMIN: 3.3 g/dL — AB (ref 3.5–5.0)
ALK PHOS: 68 U/L (ref 38–126)
ALT: 32 U/L (ref 17–63)
AST: 54 U/L — ABNORMAL HIGH (ref 15–41)
Anion gap: 13 (ref 5–15)
BUN: 7 mg/dL (ref 6–20)
CALCIUM: 9.4 mg/dL (ref 8.9–10.3)
CHLORIDE: 86 mmol/L — AB (ref 101–111)
CO2: 26 mmol/L (ref 22–32)
Creatinine, Ser: 0.67 mg/dL (ref 0.61–1.24)
GFR calc non Af Amer: >60 mL/min (ref >60)
GLUCOSE: 79 mg/dL (ref 65–99)
Potassium: 2.7 mmol/L — CL (ref 3.5–5.1)
SODIUM: 125 mmol/L — AB (ref 135–145)
Total Bilirubin: 1.4 mg/dL — ABNORMAL HIGH (ref 0.3–1.2)
Total Protein: 5.9 g/dL — ABNORMAL LOW (ref 6.5–8.1)

## 2015-01-15 LAB — CBC WITH DIFFERENTIAL/PLATELET
BASOS PCT: 1 %
Basophils Absolute: 0 10*3/uL (ref 0.0–0.1)
EOS ABS: 0.1 10*3/uL (ref 0.0–0.7)
EOS PCT: 1 %
HCT: 30.6 % — ABNORMAL LOW (ref 39.0–52.0)
HEMOGLOBIN: 11.1 g/dL — AB (ref 13.0–17.0)
Lymphocytes Relative: 34 %
Lymphs Abs: 1.7 10*3/uL (ref 0.7–4.0)
MCH: 34.6 pg — AB (ref 26.0–34.0)
MCHC: 36.3 g/dL — AB (ref 30.0–36.0)
MCV: 95.3 fL (ref 78.0–100.0)
Monocytes Absolute: 0.4 10*3/uL (ref 0.1–1.0)
Monocytes Relative: 9 %
Neutro Abs: 2.7 10*3/uL (ref 1.7–7.7)
Neutrophils Relative %: 55 %
PLATELETS: 182 10*3/uL (ref 150–400)
RBC: 3.21 MIL/uL — ABNORMAL LOW (ref 4.22–5.81)
RDW: 12.3 % (ref 11.5–15.5)
WBC: 4.9 10*3/uL (ref 4.0–10.5)

## 2015-01-15 LAB — RAPID URINE DRUG SCREEN, HOSP PERFORMED
Amphetamines: NOT DETECTED
BARBITURATES: NOT DETECTED
BENZODIAZEPINES: NOT DETECTED
COCAINE: NOT DETECTED
Opiates: NOT DETECTED
TETRAHYDROCANNABINOL: NOT DETECTED

## 2015-01-15 LAB — BASIC METABOLIC PANEL
ANION GAP: 12 (ref 5–15)
Anion gap: 15 (ref 5–15)
BUN: 7 mg/dL (ref 6–20)
BUN: 6 mg/dL (ref 6–20)
CHLORIDE: 89 mmol/L — AB (ref 101–111)
CHLORIDE: 97 mmol/L — AB (ref 101–111)
CO2: 25 mmol/L (ref 22–32)
CO2: 24 mmol/L (ref 22–32)
CREATININE: 0.61 mg/dL (ref 0.61–1.24)
Calcium: 9 mg/dL (ref 8.9–10.3)
Calcium: 8.4 mg/dL — ABNORMAL LOW (ref 8.9–10.3)
Creatinine, Ser: 0.6 mg/dL — ABNORMAL LOW (ref 0.61–1.24)
GFR calc Af Amer: >60 mL/min (ref >60)
GFR calc non Af Amer: >60 mL/min (ref >60)
Glucose, Bld: 66 mg/dL (ref 65–99)
Glucose, Bld: 53 mg/dL — ABNORMAL LOW (ref 65–99)
POTASSIUM: 3.2 mmol/L — AB (ref 3.5–5.1)
POTASSIUM: 3.4 mmol/L — AB (ref 3.5–5.1)
SODIUM: 129 mmol/L — AB (ref 135–145)
SODIUM: 133 mmol/L — AB (ref 135–145)

## 2015-01-15 LAB — CBC
HEMATOCRIT: 29.4 % — AB (ref 39.0–52.0)
HEMOGLOBIN: 10.4 g/dL — AB (ref 13.0–17.0)
MCH: 34 pg (ref 26.0–34.0)
MCHC: 35.4 g/dL (ref 30.0–36.0)
MCV: 96.1 fL (ref 78.0–100.0)
Platelets: 155 10*3/uL (ref 150–400)
RBC: 3.06 MIL/uL — AB (ref 4.22–5.81)
RDW: 12.5 % (ref 11.5–15.5)
WBC: 5 10*3/uL (ref 4.0–10.5)

## 2015-01-15 LAB — MAGNESIUM: MAGNESIUM: 1.5 mg/dL — AB (ref 1.7–2.4)

## 2015-01-15 LAB — ETHANOL: ALCOHOL ETHYL (B): 332 mg/dL — AB (ref <5)

## 2015-01-15 LAB — PROTIME-INR
INR: 1.01 (ref 0.00–1.49)
Prothrombin Time: 13.5 seconds (ref 11.6–15.2)

## 2015-01-15 MED ORDER — ACETAMINOPHEN 325 MG PO TABS: 650.0000 mg | ORAL_TABLET | Freq: Four times a day (QID) | ORAL | Status: DC | PRN

## 2015-01-15 MED ORDER — POTASSIUM CHLORIDE 10 MEQ/100ML IV SOLN
10.0000 meq | INTRAVENOUS | Status: AC
  Administered 2015-01-15: 10 meq via INTRAVENOUS
  Filled 2015-01-15: qty 100

## 2015-01-15 MED ORDER — ATORVASTATIN CALCIUM 40 MG PO TABS
40.0000 mg | ORAL_TABLET | Freq: Every day | ORAL | Status: DC
  Administered 2015-01-15: 40 mg via ORAL
  Filled 2015-01-15: qty 1

## 2015-01-15 MED ORDER — SODIUM CHLORIDE 0.9 % IV BOLUS (SEPSIS)
2000.0000 mL | Freq: Once | INTRAVENOUS | Status: AC
  Administered 2015-01-15: 2000 mL via INTRAVENOUS

## 2015-01-15 MED ORDER — VITAMIN B-1 100 MG PO TABS
100.0000 mg | ORAL_TABLET | Freq: Every day | ORAL | Status: DC
  Filled 2015-01-15: qty 1

## 2015-01-15 MED ORDER — ENOXAPARIN SODIUM 40 MG/0.4ML ~~LOC~~ SOLN
40.0000 mg | Freq: Every day | SUBCUTANEOUS | Status: DC
  Administered 2015-01-15: 40 mg via SUBCUTANEOUS
  Filled 2015-01-15: qty 0.4

## 2015-01-15 MED ORDER — LORAZEPAM 2 MG/ML IJ SOLN: 1.0000 mg | Freq: Four times a day (QID) | INTRAMUSCULAR | Status: DC | PRN

## 2015-01-15 MED ORDER — THIAMINE HCL 100 MG/ML IJ SOLN
100.0000 mg | Freq: Every day | INTRAMUSCULAR | Status: DC
  Administered 2015-01-15: 100 mg via INTRAVENOUS
  Filled 2015-01-15: qty 2

## 2015-01-15 MED ORDER — ADULT MULTIVITAMIN W/MINERALS CH
1.0000 | ORAL_TABLET | Freq: Every day | ORAL | Status: DC
  Administered 2015-01-15: 1 via ORAL
  Filled 2015-01-15: qty 1

## 2015-01-15 MED ORDER — SODIUM CHLORIDE 0.9 % IJ SOLN
3.0000 mL | Freq: Two times a day (BID) | INTRAMUSCULAR | Status: DC
  Administered 2015-01-15: 3 mL via INTRAVENOUS

## 2015-01-15 MED ORDER — POTASSIUM CHLORIDE CRYS ER 20 MEQ PO TBCR
40.0000 meq | EXTENDED_RELEASE_TABLET | Freq: Once | ORAL | Status: AC
  Administered 2015-01-15: 40 meq via ORAL
  Filled 2015-01-15: qty 2

## 2015-01-15 MED ORDER — METOPROLOL TARTRATE 25 MG PO TABS
12.5000 mg | ORAL_TABLET | Freq: Two times a day (BID) | ORAL | Status: DC
Start: 2015-01-15 — End: 2015-08-09

## 2015-01-15 MED ORDER — METOPROLOL TARTRATE 12.5 MG HALF TABLET
12.5000 mg | ORAL_TABLET | Freq: Two times a day (BID) | ORAL | Status: DC
  Filled 2015-01-15: qty 1

## 2015-01-15 MED ORDER — FERROUS SULFATE 325 (65 FE) MG PO TABS: 325.0000 mg | ORAL_TABLET | Freq: Every day | ORAL | Status: DC

## 2015-01-15 MED ORDER — ACETAMINOPHEN 650 MG RE SUPP: 650.0000 mg | Freq: Four times a day (QID) | RECTAL | Status: DC | PRN

## 2015-01-15 MED ORDER — INFLUENZA VAC SPLIT QUAD 0.5 ML IM SUSY: 0.5000 mL | PREFILLED_SYRINGE | INTRAMUSCULAR | Status: DC

## 2015-01-15 MED ORDER — MAGNESIUM SULFATE 2 GM/50ML IV SOLN
2.0000 g | Freq: Once | INTRAVENOUS | Status: AC
Start: 2015-01-15 — End: 2015-01-15
  Administered 2015-01-15: 2 g via INTRAVENOUS
  Filled 2015-01-15: qty 50

## 2015-01-15 MED ORDER — LISINOPRIL 5 MG PO TABS
5.0000 mg | ORAL_TABLET | Freq: Every day | ORAL | Status: DC
  Administered 2015-01-15: 5 mg via ORAL
  Filled 2015-01-15: qty 1

## 2015-01-15 MED ORDER — FOLIC ACID 1 MG PO TABS
1.0000 mg | ORAL_TABLET | Freq: Every day | ORAL | Status: DC
Start: 2015-01-15 — End: 2015-01-15
  Administered 2015-01-15: 1 mg via ORAL
  Filled 2015-01-15: qty 1

## 2015-01-15 MED ORDER — POTASSIUM CHLORIDE CRYS ER 20 MEQ PO TBCR: 40.0000 meq | EXTENDED_RELEASE_TABLET | Freq: Two times a day (BID) | ORAL | Status: DC

## 2015-01-15 MED ORDER — LORAZEPAM 1 MG PO TABS: 1.0000 mg | ORAL_TABLET | Freq: Four times a day (QID) | ORAL | Status: DC | PRN

## 2015-01-15 MED ORDER — METOPROLOL TARTRATE 25 MG PO TABS
25.0000 mg | ORAL_TABLET | Freq: Two times a day (BID) | ORAL | Status: DC
  Administered 2015-01-15: 25 mg via ORAL
  Filled 2015-01-15: qty 1

## 2015-01-15 MED ORDER — FERROUS SULFATE 325 (65 FE) MG PO TABS
325.0000 mg | ORAL_TABLET | Freq: Every day | ORAL | Status: DC
  Administered 2015-01-15: 325 mg via ORAL
  Filled 2015-01-15: qty 1

## 2015-01-15 NOTE — Plan of Care (Signed)
Problem: Discharge Progression Outcomes Goal: Tolerating diet Outcome: Progressing Patient has poor appetitte

## 2015-01-15 NOTE — Discharge Instructions (Signed)
You were admitted after a fall and you were also noted to have low blood pressure. Your metoprolol was cut in half to 12.5 twice a day and stop taking your Hydrochlorothiazide. You have a blood pressure appointment on Friday 10/21 at 2 pm and a follow up with your PCP on 10/28.  Additionally please make a follow up appointment with Neurosurgery as soon as possible   Please continue wearing the collar for the next two weeks. Please make an appointment with [facility] after two weeks to discuss next steps. Please follow with your primary care physician for hospital follow up    Cervical Collar A cervical collar is a device that supports your chin and the back of your head. It is used after a severe neck injury to protect your head and neck. It does this by restricting the movement of the top part of your spine, which is located in your neck. A cervical collar may be used when you have:  A fractured neck.  Ligament damage.  A spinal cord injury. WHAT INSTRUCTIONS SHOULD I FOLLOW?  Wear the collar for as long as your health care provider instructs.  Follow your health care provider's instructions about how to put on and take off your collar.  Do not make your collar so tight that you feel pain or it is hard for you to breathe.  Do not remove the collar unless your health care provider says it is okay. Ask your health care provider if you can remove the collar for showering or eating or to apply ice.    Document Released: 12/09/2003 Document Revised: 04/08/2014 Document Reviewed: 10/25/2013 Elsevier Interactive Patient Education 2016 [place].   Follow-up Information    Follow up with [name], MD. Go on 01/27/2015.   Specialty:  Family Medicine   Why:  Hospital follow-up @ 10;15   Contact information:   [place] [id number] [phone]       Follow up with [facility]. Schedule an appointment as soon as  possible for a visit in 2 weeks.   Specialty:  Neurosurgery   Why:  Follow up for Cervical fracture    Contact information:   [address] [place] [address] [phone]       Follow up with Evette Doffing, MD On 01/20/2015.   Specialty:  Family Medicine   Why:  2 pm   Contact information:   [place] [address] [phone]

## 2015-01-15 NOTE — ED Notes (Signed)
Informed MD of potassium, sodium and ETOH levels. ]

## 2015-01-15 NOTE — Progress Notes (Addendum)
Spoke with neurosurgery this morning. No surgical intervention required for this C4 fracture. This type of fracture will heal in its own time. Patient needs to continue to wearing neck collar for the next two weeks and needs to follow up as an outpatient. Per patient not interested in stopping his alcohol intake, not interested in talking to anyone regarding this issue.
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
## 2015-01-16 NOTE — Discharge Summary (Signed)
[facility] Discharge Summary  Patient name: [name] Medical record number: [id number] Date of birth: 20-Dec-1935 Age: 79 y.o. Gender: male Date of Admission: 01/14/2015  Date of Discharge: 01/15/2015 Admitting Physician: [name], MD  Primary Care Provider: [name], MD Consultants: Neurosurgery   Indication for Hospitalization: Altered Mental status   Discharge Diagnoses/Problem List:  Hyponatremia/Hypokalemia  Cervical Fracture  CAD Atrial Fibrillation  CHF HTN  HLD  EtOH abuse  GERD   Disposition: Home   Discharge Condition:  Stable   Discharge Exam:  General: Patient lying in bed, with aspen collar in place, NAD  Eyes: Pupils Equal Round Reactive to light, Extraocular movements intact, Conjunctiva without redness or discharge ENTM: Moist mucosa membranes No hemotympanum  Neck: Aspen collar in place  Cardiovascular: RRR, no murmurs, rubs, gallops no pitting edema noted.  Respiratory: CTAB, slight intermittent crackles at bases no wheezing or rhonchi Abdomen: BS+, no ttp, no rebound, no guarding  Skin: Abrasions on bilateral knees. Abrasions covered on the L elbow. Abrasion to the forehead without swelling/ecchymoses.  Neuro: Neurologic exam : Strength equal & normal in upper & lower extremities Sensation intact.  Psych: Patient appears confused, however aware that he is in the hospital    Brief Hospital Course:   [name] is a 79 y.o. male presenting with Altered Mental Status. Patient was found to have an Alcohol level of 332 which likely was reason for his altered mental status. Apparently per ED physician patient had fallen forward, and was found on CT to have C4 fracture of the posterior aspect of ight transverse process of C4 and at the base of the spinous process of C4. Per consult with neurosurgery, this did not require any surgical intervention and patient was instructed to remain in Aspen neck collar  for 2 weeks and follow up with neurosurgery at that point. Patient was also found to have Na 125 and K 2.7 likely due to beer potomania. Patient's potassium was replaced with IV and oral potassium, and patient Na levels improved with fluid restriction.     Issues for Follow Up:  1. Recheck BMET in office, may need to provide potassium supplementation 2. Ensure patient continues to wear neck brace for two weeks, or approximately until the end of October 3. Should follow up with neurosurgery, per Dr. Ronnald Ramp patient's fracture should naturally heal on its own.   Significant Procedures: None   Significant Labs and Imaging:   Recent Labs Lab 01/14/15 2352 01/15/15 0408  WBC 4.9 5.0  HGB 11.1* 10.4*  HCT 30.6* 29.4*  PLT 182 155    Recent Labs Lab 01/14/15 2352 01/15/15 0408 01/15/15 1239  NA 125* 129* 133*  K 2.7* 3.2* 3.4*  CL 86* 89* 97*  CO2 26 25 24   GLUCOSE 79 66 53*  BUN 7 7 6   CREATININE 0.67 0.61 0.60*  CALCIUM 9.4 9.0 8.4*  MG  --  1.5*  --   ALKPHOS 68  --   --   AST 54*  --   --   ALT 32  --   --   ALBUMIN 3.3*  --   --       Results/Tests Pending at Time of Discharge: None   Discharge Medications:    Medication List    STOP taking these medications        hydrochlorothiazide 25 MG tablet  Commonly known as:  HYDRODIURIL      TAKE these medications  aspirin EC 81 MG tablet  Take 1 tablet (81 mg total) by mouth daily.     atorvastatin 40 MG tablet  Commonly known as:  LIPITOR  Take 1 tablet (40 mg total) by mouth daily.     ferrous sulfate 325 (65 FE) MG tablet  TAKE 1 TABLET BY MOUTH EVERY DAY WITH BREAKFAST     lisinopril 5 MG tablet  Commonly known as:  PRINIVIL,ZESTRIL  TAKE 1 TABLET BY MOUTH DAILY     metoprolol tartrate 25 MG tablet  Commonly known as:  LOPRESSOR  Take 0.5 tablets (12.5 mg total) by mouth 2 (two) times daily.     multivitamin with minerals Tabs tablet  Take 1 tablet by mouth daily.     NITROSTAT 0.4 MG SL  tablet  Generic drug:  nitroGLYCERIN  PLACE 1 TABLET UNDER THE TONGUE EVERY 5 MINUTES AS NEEDED FOR CHEST PAIN AS DIRECTED        Discharge Instructions: Please refer to Patient Instructions section of EMR for full details.  Patient was counseled important signs and symptoms that should prompt return to medical care, changes in medications, dietary instructions, activity restrictions, and follow up appointments.   Follow-Up Appointments: Follow-up Information    Follow up with [name], MD. Go on 01/27/2015.   Specialty:  Family Medicine   Why:  Hospital follow-up @ 10;15   Contact information:   [place] [id number] [phone]       Follow up with [facility]. Schedule an appointment as soon as possible for a visit in 2 weeks.   Specialty:  Neurosurgery   Why:  Follow up for Cervical fracture    Contact information:   [address] [place]  [address] [phone]       Follow up with Evette Doffing, MD On 01/20/2015.   Specialty:  Family Medicine   Why:  2 pm   Contact information:   [place] [id number] [phone]       Tonette Bihari, MD 01/16/2015, 2:23 PM PGY-1, [place]

## 2015-01-20 ENCOUNTER — Ambulatory Visit: Payer: Self-pay | Admitting: Internal Medicine

## 2015-01-27 ENCOUNTER — Inpatient Hospital Stay: Payer: Self-pay | Admitting: Family Medicine

## 2015-03-10 ENCOUNTER — Other Ambulatory Visit: Payer: Self-pay | Admitting: Family Medicine

## 2015-08-09 ENCOUNTER — Other Ambulatory Visit: Payer: Self-pay | Admitting: Student

## 2015-12-24 ENCOUNTER — Inpatient Hospital Stay (HOSPITAL_COMMUNITY)
Admission: EM | Admit: 2015-12-24 | Discharge: 2015-12-28 | DRG: 690 | Disposition: A | Discharge status: Skilled Nursing Facility | Payer: Medicare Other | Attending: Internal Medicine | Admitting: Internal Medicine

## 2015-12-24 ENCOUNTER — Emergency Department (HOSPITAL_COMMUNITY): Payer: Medicare Other

## 2015-12-24 ENCOUNTER — Encounter (HOSPITAL_COMMUNITY): Payer: Self-pay | Admitting: *Deleted

## 2015-12-24 DIAGNOSIS — N390 Urinary tract infection, site not specified: Secondary | ICD-10-CM | POA: Diagnosis not present

## 2015-12-24 DIAGNOSIS — E860 Dehydration: Secondary | ICD-10-CM | POA: Diagnosis present

## 2015-12-24 DIAGNOSIS — I429 Cardiomyopathy, unspecified: Secondary | ICD-10-CM | POA: Diagnosis present

## 2015-12-24 DIAGNOSIS — I4891 Unspecified atrial fibrillation: Secondary | ICD-10-CM | POA: Diagnosis present

## 2015-12-24 DIAGNOSIS — D649 Anemia, unspecified: Secondary | ICD-10-CM | POA: Diagnosis present

## 2015-12-24 DIAGNOSIS — I447 Left bundle-branch block, unspecified: Secondary | ICD-10-CM | POA: Diagnosis present

## 2015-12-24 DIAGNOSIS — D72829 Elevated white blood cell count, unspecified: Secondary | ICD-10-CM

## 2015-12-24 DIAGNOSIS — R140 Abdominal distension (gaseous): Secondary | ICD-10-CM

## 2015-12-24 DIAGNOSIS — Z825 Family history of asthma and other chronic lower respiratory diseases: Secondary | ICD-10-CM

## 2015-12-24 DIAGNOSIS — E876 Hypokalemia: Secondary | ICD-10-CM

## 2015-12-24 DIAGNOSIS — R531 Weakness: Secondary | ICD-10-CM

## 2015-12-24 DIAGNOSIS — D6959 Other secondary thrombocytopenia: Secondary | ICD-10-CM | POA: Diagnosis present

## 2015-12-24 DIAGNOSIS — Z951 Presence of aortocoronary bypass graft: Secondary | ICD-10-CM

## 2015-12-24 DIAGNOSIS — I2510 Atherosclerotic heart disease of native coronary artery without angina pectoris: Secondary | ICD-10-CM | POA: Diagnosis present

## 2015-12-24 DIAGNOSIS — E871 Hypo-osmolality and hyponatremia: Secondary | ICD-10-CM | POA: Diagnosis present

## 2015-12-24 DIAGNOSIS — B961 Klebsiella pneumoniae [K. pneumoniae] as the cause of diseases classified elsewhere: Secondary | ICD-10-CM | POA: Diagnosis present

## 2015-12-24 DIAGNOSIS — D72819 Decreased white blood cell count, unspecified: Secondary | ICD-10-CM | POA: Diagnosis present

## 2015-12-24 DIAGNOSIS — Z807 Family history of other malignant neoplasms of lymphoid, hematopoietic and related tissues: Secondary | ICD-10-CM

## 2015-12-24 DIAGNOSIS — I252 Old myocardial infarction: Secondary | ICD-10-CM

## 2015-12-24 DIAGNOSIS — F1010 Alcohol abuse, uncomplicated: Secondary | ICD-10-CM

## 2015-12-24 DIAGNOSIS — Z8719 Personal history of other diseases of the digestive system: Secondary | ICD-10-CM

## 2015-12-24 DIAGNOSIS — I119 Hypertensive heart disease without heart failure: Secondary | ICD-10-CM | POA: Diagnosis present

## 2015-12-24 DIAGNOSIS — I959 Hypotension, unspecified: Secondary | ICD-10-CM | POA: Diagnosis present

## 2015-12-24 HISTORY — DX: Urinary tract infection, site not specified: N39.0

## 2015-12-24 LAB — CBC WITH DIFFERENTIAL/PLATELET
Basophils Absolute: 0 10*3/uL (ref 0.0–0.1)
Basophils Relative: 0 %
Eosinophils Absolute: 0 10*3/uL (ref 0.0–0.7)
Eosinophils Relative: 1 %
HEMATOCRIT: 30.9 % — AB (ref 39.0–52.0)
HEMOGLOBIN: 10.5 g/dL — AB (ref 13.0–17.0)
LYMPHS ABS: 1.3 10*3/uL (ref 0.7–4.0)
Lymphocytes Relative: 40 %
MCH: 35.4 pg — AB (ref 26.0–34.0)
MCHC: 34 g/dL (ref 30.0–36.0)
MCV: 104 fL — ABNORMAL HIGH (ref 78.0–100.0)
MONO ABS: 0.2 10*3/uL (ref 0.1–1.0)
MONOS PCT: 7 %
NEUTROS ABS: 1.6 10*3/uL — AB (ref 1.7–7.7)
NEUTROS PCT: 52 %
Platelets: 86 10*3/uL — ABNORMAL LOW (ref 150–400)
RBC: 2.97 MIL/uL — ABNORMAL LOW (ref 4.22–5.81)
RDW: 13 % (ref 11.5–15.5)
WBC: 3.1 10*3/uL — ABNORMAL LOW (ref 4.0–10.5)

## 2015-12-24 LAB — COMPREHENSIVE METABOLIC PANEL
ALK PHOS: 87 U/L (ref 38–126)
ALT: 45 U/L (ref 17–63)
ANION GAP: 13 (ref 5–15)
AST: 86 U/L — ABNORMAL HIGH (ref 15–41)
Albumin: 1.9 g/dL — ABNORMAL LOW (ref 3.5–5.0)
BILIRUBIN TOTAL: 1.1 mg/dL (ref 0.3–1.2)
BUN: 5 mg/dL — ABNORMAL LOW (ref 6–20)
CALCIUM: 7.5 mg/dL — AB (ref 8.9–10.3)
CO2: 22 mmol/L (ref 22–32)
Chloride: 93 mmol/L — ABNORMAL LOW (ref 101–111)
Creatinine, Ser: 0.76 mg/dL (ref 0.61–1.24)
GFR calc non Af Amer: >60 mL/min (ref >60)
Glucose, Bld: 86 mg/dL (ref 65–99)
POTASSIUM: 3 mmol/L — AB (ref 3.5–5.1)
Sodium: 128 mmol/L — ABNORMAL LOW (ref 135–145)
TOTAL PROTEIN: 5.1 g/dL — AB (ref 6.5–8.1)

## 2015-12-24 LAB — URINALYSIS, ROUTINE W REFLEX MICROSCOPIC
BILIRUBIN URINE: NEGATIVE
GLUCOSE, UA: NEGATIVE mg/dL
KETONES UR: NEGATIVE mg/dL
Nitrite: NEGATIVE
PROTEIN: NEGATIVE mg/dL
Specific Gravity, Urine: <1.005 — ABNORMAL LOW (ref 1.005–1.030)
pH: 6 (ref 5.0–8.0)

## 2015-12-24 LAB — MAGNESIUM: Magnesium: 1.3 mg/dL — ABNORMAL LOW (ref 1.7–2.4)

## 2015-12-24 LAB — TROPONIN I: TROPONIN I: 0.13 ng/mL — AB (ref <0.03)

## 2015-12-24 LAB — URINE MICROSCOPIC-ADD ON

## 2015-12-24 LAB — ETHANOL: ALCOHOL ETHYL (B): 181 mg/dL — AB (ref <5)

## 2015-12-24 LAB — TSH: TSH: 2.098 u[IU]/mL (ref 0.350–4.500)

## 2015-12-24 MED ORDER — SODIUM CHLORIDE 0.9 % IV SOLN
1.0000 g | Freq: Once | INTRAVENOUS | Status: AC
  Administered 2015-12-25: 1 g via INTRAVENOUS
  Filled 2015-12-24: qty 10

## 2015-12-24 MED ORDER — SODIUM CHLORIDE 0.9 % IV BOLUS (SEPSIS)
500.0000 mL | Freq: Once | INTRAVENOUS | Status: AC
  Administered 2015-12-24: 500 mL via INTRAVENOUS

## 2015-12-24 MED ORDER — MAGNESIUM SULFATE 2 GM/50ML IV SOLN
2.0000 g | Freq: Once | INTRAVENOUS | Status: AC
  Administered 2015-12-25: 2 g via INTRAVENOUS
  Filled 2015-12-24: qty 50

## 2015-12-24 MED ORDER — POTASSIUM CHLORIDE 10 MEQ/100ML IV SOLN
10.0000 meq | Freq: Once | INTRAVENOUS | Status: AC
  Administered 2015-12-25: 10 meq via INTRAVENOUS
  Filled 2015-12-24: qty 100

## 2015-12-24 MED ORDER — VITAMIN B-1 100 MG PO TABS: 50.0000 mg | ORAL_TABLET | Freq: Once | ORAL | Status: DC

## 2015-12-24 MED ORDER — SODIUM CHLORIDE 0.9 % IV BOLUS (SEPSIS)
1000.0000 mL | Freq: Once | INTRAVENOUS | Status: AC
  Administered 2015-12-25: 1000 mL via INTRAVENOUS

## 2015-12-24 NOTE — ED Triage Notes (Signed)
The pt arrived by [facility] from home  Brought in  For pt c/o weakness for 2 days  Some blood noticed in his urine in his diaper sometime tyoday.  He has been drinking alcohol today.  Lower extremitiy edema for 2 weeks.  Iv per [facility]
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
## 2015-12-25 ENCOUNTER — Observation Stay (HOSPITAL_COMMUNITY): Payer: Medicare Other

## 2015-12-25 ENCOUNTER — Encounter (HOSPITAL_COMMUNITY): Payer: Self-pay | Admitting: *Deleted

## 2015-12-25 DIAGNOSIS — Z951 Presence of aortocoronary bypass graft: Secondary | ICD-10-CM | POA: Diagnosis not present

## 2015-12-25 DIAGNOSIS — Z807 Family history of other malignant neoplasms of lymphoid, hematopoietic and related tissues: Secondary | ICD-10-CM | POA: Diagnosis not present

## 2015-12-25 DIAGNOSIS — E871 Hypo-osmolality and hyponatremia: Secondary | ICD-10-CM

## 2015-12-25 DIAGNOSIS — I119 Hypertensive heart disease without heart failure: Secondary | ICD-10-CM | POA: Diagnosis present

## 2015-12-25 DIAGNOSIS — Z825 Family history of asthma and other chronic lower respiratory diseases: Secondary | ICD-10-CM | POA: Diagnosis not present

## 2015-12-25 DIAGNOSIS — F1010 Alcohol abuse, uncomplicated: Secondary | ICD-10-CM | POA: Diagnosis present

## 2015-12-25 DIAGNOSIS — I2510 Atherosclerotic heart disease of native coronary artery without angina pectoris: Secondary | ICD-10-CM | POA: Diagnosis present

## 2015-12-25 DIAGNOSIS — D72819 Decreased white blood cell count, unspecified: Secondary | ICD-10-CM | POA: Diagnosis present

## 2015-12-25 DIAGNOSIS — D649 Anemia, unspecified: Secondary | ICD-10-CM | POA: Diagnosis present

## 2015-12-25 DIAGNOSIS — R140 Abdominal distension (gaseous): Secondary | ICD-10-CM

## 2015-12-25 DIAGNOSIS — E860 Dehydration: Secondary | ICD-10-CM | POA: Diagnosis present

## 2015-12-25 DIAGNOSIS — K760 Fatty (change of) liver, not elsewhere classified: Secondary | ICD-10-CM

## 2015-12-25 DIAGNOSIS — N390 Urinary tract infection, site not specified: Secondary | ICD-10-CM | POA: Diagnosis present

## 2015-12-25 DIAGNOSIS — E876 Hypokalemia: Secondary | ICD-10-CM | POA: Diagnosis present

## 2015-12-25 DIAGNOSIS — D6959 Other secondary thrombocytopenia: Secondary | ICD-10-CM | POA: Diagnosis present

## 2015-12-25 DIAGNOSIS — R531 Weakness: Secondary | ICD-10-CM | POA: Diagnosis not present

## 2015-12-25 DIAGNOSIS — I429 Cardiomyopathy, unspecified: Secondary | ICD-10-CM | POA: Diagnosis present

## 2015-12-25 DIAGNOSIS — B961 Klebsiella pneumoniae [K. pneumoniae] as the cause of diseases classified elsewhere: Secondary | ICD-10-CM | POA: Diagnosis present

## 2015-12-25 DIAGNOSIS — I447 Left bundle-branch block, unspecified: Secondary | ICD-10-CM | POA: Diagnosis present

## 2015-12-25 DIAGNOSIS — R7989 Other specified abnormal findings of blood chemistry: Secondary | ICD-10-CM | POA: Diagnosis not present

## 2015-12-25 DIAGNOSIS — I252 Old myocardial infarction: Secondary | ICD-10-CM | POA: Diagnosis not present

## 2015-12-25 DIAGNOSIS — D72829 Elevated white blood cell count, unspecified: Secondary | ICD-10-CM

## 2015-12-25 DIAGNOSIS — I4891 Unspecified atrial fibrillation: Secondary | ICD-10-CM | POA: Diagnosis present

## 2015-12-25 DIAGNOSIS — I959 Hypotension, unspecified: Secondary | ICD-10-CM | POA: Diagnosis present

## 2015-12-25 HISTORY — DX: Fatty (change of) liver, not elsewhere classified: K76.0

## 2015-12-25 LAB — COMPREHENSIVE METABOLIC PANEL
ALT: 47 U/L (ref 17–63)
ANION GAP: 9 (ref 5–15)
AST: 85 U/L — ABNORMAL HIGH (ref 15–41)
Albumin: 2 g/dL — ABNORMAL LOW (ref 3.5–5.0)
Alkaline Phosphatase: 92 U/L (ref 38–126)
BILIRUBIN TOTAL: 1 mg/dL (ref 0.3–1.2)
BUN: 5 mg/dL — ABNORMAL LOW (ref 6–20)
CO2: 22 mmol/L (ref 22–32)
Calcium: 7.3 mg/dL — ABNORMAL LOW (ref 8.9–10.3)
Chloride: 101 mmol/L (ref 101–111)
Creatinine, Ser: 0.59 mg/dL — ABNORMAL LOW (ref 0.61–1.24)
GFR calc Af Amer: >60 mL/min (ref >60)
Glucose, Bld: 69 mg/dL (ref 65–99)
POTASSIUM: 3.1 mmol/L — AB (ref 3.5–5.1)
Sodium: 132 mmol/L — ABNORMAL LOW (ref 135–145)
TOTAL PROTEIN: 5.3 g/dL — AB (ref 6.5–8.1)

## 2015-12-25 LAB — AMMONIA: AMMONIA: 31 umol/L (ref 9–35)

## 2015-12-25 LAB — CBC WITH DIFFERENTIAL/PLATELET
BASOS ABS: 0 10*3/uL (ref 0.0–0.1)
Basophils Relative: 1 %
EOS PCT: 2 %
Eosinophils Absolute: 0 10*3/uL (ref 0.0–0.7)
HEMATOCRIT: 31.6 % — AB (ref 39.0–52.0)
Hemoglobin: 11.1 g/dL — ABNORMAL LOW (ref 13.0–17.0)
LYMPHS ABS: 0.7 10*3/uL (ref 0.7–4.0)
Lymphocytes Relative: 39 %
MCH: 36.8 pg — ABNORMAL HIGH (ref 26.0–34.0)
MCHC: 35.1 g/dL (ref 30.0–36.0)
MCV: 104.6 fL — AB (ref 78.0–100.0)
MONOS PCT: 8 %
Monocytes Absolute: 0.2 10*3/uL (ref 0.1–1.0)
NEUTROS ABS: 1 10*3/uL — AB (ref 1.7–7.7)
Neutrophils Relative %: 50 %
Platelets: 72 10*3/uL — ABNORMAL LOW (ref 150–400)
RBC: 3.02 MIL/uL — ABNORMAL LOW (ref 4.22–5.81)
RDW: 13.5 % (ref 11.5–15.5)
WBC: 1.9 10*3/uL — ABNORMAL LOW (ref 4.0–10.5)

## 2015-12-25 LAB — MAGNESIUM: MAGNESIUM: 1.8 mg/dL (ref 1.7–2.4)

## 2015-12-25 LAB — PROCALCITONIN: PROCALCITONIN: 0.2 ng/mL

## 2015-12-25 LAB — POTASSIUM: POTASSIUM: 3.7 mmol/L (ref 3.5–5.1)

## 2015-12-25 LAB — LACTIC ACID, PLASMA
LACTIC ACID, VENOUS: 2.2 mmol/L — AB (ref 0.5–1.9)
Lactic Acid, Venous: 3.4 mmol/L (ref 0.5–1.9)

## 2015-12-25 LAB — TROPONIN I
TROPONIN I: 0.13 ng/mL — AB (ref <0.03)
TROPONIN I: 0.1 ng/mL — AB (ref <0.03)
TROPONIN I: 0.06 ng/mL — AB (ref <0.03)

## 2015-12-25 LAB — I-STAT CG4 LACTIC ACID, ED: LACTIC ACID, VENOUS: 4.94 mmol/L — AB (ref 0.5–1.9)

## 2015-12-25 LAB — PATHOLOGIST SMEAR REVIEW

## 2015-12-25 MED ORDER — FOLIC ACID 1 MG PO TABS
1.0000 mg | ORAL_TABLET | Freq: Every day | ORAL | Status: DC
  Administered 2015-12-25 – 2015-12-28 (×4): 1 mg via ORAL
  Filled 2015-12-25 (×4): qty 1

## 2015-12-25 MED ORDER — SODIUM CHLORIDE 0.9 % IV BOLUS (SEPSIS)
500.0000 mL | Freq: Once | INTRAVENOUS | Status: AC
  Administered 2015-12-25: 500 mL via INTRAVENOUS

## 2015-12-25 MED ORDER — ACETAMINOPHEN 650 MG RE SUPP: 650.0000 mg | Freq: Four times a day (QID) | RECTAL | Status: DC | PRN

## 2015-12-25 MED ORDER — DEXTROSE 5 % IV SOLN
1.0000 g | INTRAVENOUS | Status: DC
  Administered 2015-12-25 – 2015-12-27 (×2): 1 g via INTRAVENOUS
  Filled 2015-12-25 (×2): qty 10

## 2015-12-25 MED ORDER — LORAZEPAM 2 MG/ML IJ SOLN: 0.0000 mg | Freq: Two times a day (BID) | INTRAMUSCULAR | Status: DC

## 2015-12-25 MED ORDER — ACETAMINOPHEN 325 MG PO TABS: 650.0000 mg | ORAL_TABLET | Freq: Four times a day (QID) | ORAL | Status: DC | PRN

## 2015-12-25 MED ORDER — POTASSIUM CHLORIDE 20 MEQ/15ML (10%) PO SOLN
40.0000 meq | ORAL | Status: AC
  Administered 2015-12-25 (×2): 40 meq via ORAL
  Filled 2015-12-25 (×2): qty 30

## 2015-12-25 MED ORDER — ASPIRIN EC 81 MG PO TBEC
81.0000 mg | DELAYED_RELEASE_TABLET | Freq: Every day | ORAL | Status: DC
  Administered 2015-12-25 – 2015-12-28 (×4): 81 mg via ORAL
  Filled 2015-12-25 (×4): qty 1

## 2015-12-25 MED ORDER — LORAZEPAM 2 MG/ML IJ SOLN: 0.0000 mg | Freq: Four times a day (QID) | INTRAMUSCULAR | Status: AC

## 2015-12-25 MED ORDER — LORAZEPAM 2 MG/ML IJ SOLN
1.0000 mg | Freq: Four times a day (QID) | INTRAMUSCULAR | Status: AC | PRN
  Administered 2015-12-27: 1 mg via INTRAVENOUS
  Filled 2015-12-25: qty 1

## 2015-12-25 MED ORDER — ADULT MULTIVITAMIN W/MINERALS CH: 1.0000 | ORAL_TABLET | Freq: Every day | ORAL | Status: DC

## 2015-12-25 MED ORDER — FERROUS SULFATE 325 (65 FE) MG PO TABS
325.0000 mg | ORAL_TABLET | Freq: Three times a day (TID) | ORAL | Status: DC
  Administered 2015-12-25 – 2015-12-28 (×10): 325 mg via ORAL
  Filled 2015-12-25 (×10): qty 1

## 2015-12-25 MED ORDER — LORAZEPAM 1 MG PO TABS: 1.0000 mg | ORAL_TABLET | Freq: Four times a day (QID) | ORAL | Status: AC | PRN

## 2015-12-25 MED ORDER — SODIUM CHLORIDE 0.9 % IV BOLUS (SEPSIS)
1000.0000 mL | Freq: Once | INTRAVENOUS | Status: AC
  Administered 2015-12-25: 1000 mL via INTRAVENOUS

## 2015-12-25 MED ORDER — CEFTRIAXONE SODIUM 1 G IJ SOLR
1.0000 g | Freq: Once | INTRAMUSCULAR | Status: AC
  Administered 2015-12-25: 1 g via INTRAVENOUS
  Filled 2015-12-25: qty 10

## 2015-12-25 MED ORDER — THIAMINE HCL 100 MG/ML IJ SOLN
100.0000 mg | Freq: Every day | INTRAMUSCULAR | Status: DC
  Filled 2015-12-25: qty 2

## 2015-12-25 MED ORDER — ONDANSETRON HCL 4 MG PO TABS: 4.0000 mg | ORAL_TABLET | Freq: Four times a day (QID) | ORAL | Status: DC | PRN

## 2015-12-25 MED ORDER — NITROGLYCERIN 0.4 MG SL SUBL: 0.4000 mg | SUBLINGUAL_TABLET | SUBLINGUAL | Status: DC | PRN

## 2015-12-25 MED ORDER — SODIUM CHLORIDE 0.9 % IV SOLN
INTRAVENOUS | Status: DC
  Administered 2015-12-25: 04:00:00 via INTRAVENOUS

## 2015-12-25 MED ORDER — ADULT MULTIVITAMIN W/MINERALS CH
1.0000 | ORAL_TABLET | Freq: Every day | ORAL | Status: DC
  Administered 2015-12-25 – 2015-12-28 (×4): 1 via ORAL
  Filled 2015-12-25 (×4): qty 1

## 2015-12-25 MED ORDER — ATORVASTATIN CALCIUM 40 MG PO TABS
40.0000 mg | ORAL_TABLET | Freq: Every day | ORAL | Status: DC
  Administered 2015-12-25 – 2015-12-27 (×3): 40 mg via ORAL
  Filled 2015-12-25 (×3): qty 1

## 2015-12-25 MED ORDER — ONDANSETRON HCL 4 MG/2ML IJ SOLN: 4.0000 mg | Freq: Four times a day (QID) | INTRAMUSCULAR | Status: DC | PRN

## 2015-12-25 MED ORDER — SODIUM CHLORIDE 0.9 % IV SOLN
INTRAVENOUS | Status: DC
  Administered 2015-12-25 – 2015-12-26 (×2): via INTRAVENOUS

## 2015-12-25 MED ORDER — VITAMIN B-1 100 MG PO TABS
100.0000 mg | ORAL_TABLET | Freq: Every day | ORAL | Status: DC
  Administered 2015-12-25 – 2015-12-28 (×4): 100 mg via ORAL
  Filled 2015-12-25 (×4): qty 1
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
## 2015-12-25 NOTE — ED Notes (Signed)
The pts daughter has gone home

## 2015-12-25 NOTE — Progress Notes (Signed)
Called ER RN for report. RN not ready will call back.  Room ready

## 2015-12-25 NOTE — Progress Notes (Signed)
Results for [name], [name] (MRN [id number]) as of 12/25/2015 06:00  Ref. Range 12/25/2015 04:20  Lactic Acid, Venous Latest Ref Range: 0.5 - 1.9 mmol/L 3.4 (HH)  NP paged and made aware.

## 2015-12-25 NOTE — Evaluation (Signed)
Physical Therapy Evaluation Patient Details Name: [name] MRN: [id number] DOB: 08-Dec-1935 Today's Date: 12/25/2015   History of Present Illness  80 y.o. male with PMH of alcohol abuse, CAD status post CABG, atrial fibrillation, and cardiomyopathy. He was brought to the ER, after the patient was found to be increasingly weak over the last 2 days.  Clinical Impression  Pt admitted with above diagnosis. Pt currently with functional limitations due to the deficits listed below (see PT Problem List). On eval, pt required min assist for bed mobility. He declined transfers and ambulation even with encouragement. He reports "I just don't have the energy right now. I don't even want my feet on the floor." Pt appears depressed and disconnected. He reports having lost the assistance of friends over the past year and he is unable to tend to all his needs independently. He does exhibit self-limiting behavior.  Pt will benefit from skilled PT to increase their independence and safety with mobility to allow discharge to the venue listed below.  Pt is agreeable to [facility].      Follow Up Recommendations SNF    Equipment Recommendations  Rolling walker with 5" wheels    Recommendations for Other Services       Precautions / Restrictions Precautions Precautions: Fall      Mobility  Bed Mobility Overal bed mobility: Needs Assistance Bed Mobility: Supine to Sit;Sit to Supine     Supine to sit: Min assist Sit to supine: Min assist   General bed mobility comments: +rail, verbal cues for sequencing, assist to elevate trunk  Transfers                 General transfer comment: Pt declining progress beyond EOB.  Ambulation/Gait             General Gait Details: not testing, pt declined.  Stairs            Wheelchair Mobility    Modified Rankin (Stroke Patients Only)       Balance   Sitting-balance support: Bilateral upper extremity supported;Feet  supported Sitting balance-Leahy Scale: Good         Standing balance comment: Pt sat EOB ~ 2 minutes supervision.                             Pertinent Vitals/Pain Pain Assessment: No/denies pain    Home Living Family/patient expects to be discharged to:: Private residence Living Arrangements: Alone Available Help at Discharge: Friend(s);Available PRN/intermittently Type of Home: House Home Access: Stairs to enter Entrance Stairs-Rails: None Entrance Stairs-Number of Steps: 1 Home Layout: Laundry or work area in basement;One level Home Equipment: Cane - single point      Prior Function Level of Independence: Independent with assistive device(s)         Comments: cane used for ambulation, friends drive him to the grocery store     Hand Dominance   Dominant Hand: Right    Extremity/Trunk Assessment   Upper Extremity Assessment: Generalized weakness           Lower Extremity Assessment: Generalized weakness      Cervical / Trunk Assessment: Kyphotic  Communication   Communication: No difficulties  Cognition Arousal/Alertness: Awake/alert Behavior During Therapy: Flat affect Overall Cognitive Status: Within Functional Limits for tasks assessed                      General Comments  Exercises     Assessment/Plan    PT Assessment Patient needs continued PT services  PT Problem List Decreased strength;Decreased mobility;Decreased activity tolerance;Decreased balance;Decreased knowledge of use of DME          PT Treatment Interventions DME instruction;Gait training;Stair training;Functional mobility training;Balance training;Therapeutic exercise;Therapeutic activities;Patient/family education    PT Goals (Current goals can be found in the Care Plan section)  Acute Rehab PT Goals Patient Stated Goal: not stated PT Goal Formulation: With patient Time For Goal Achievement: 01/08/16 Potential to Achieve Goals: Good     Frequency Min 3X/week   Barriers to discharge Decreased caregiver support      Co-evaluation               End of Session   Activity Tolerance: Other (comment) (self-limiting behavior) Patient left: in bed;with bed alarm set;with call bell/phone within reach Nurse Communication: Mobility status    Functional Assessment Tool Used: clinical judgement Functional Limitation: Mobility: Walking and moving around Mobility: Walking and Moving Around Current Status [id number]): At least 40 percent but less than 60 percent impaired, limited or restricted Mobility: Walking and Moving Around Goal Status [phone]): At least 1 percent but less than 20 percent impaired, limited or restricted    Time: 0917-0934 PT Time Calculation (min) (ACUTE ONLY): 17 min   Charges:   PT Evaluation $PT Eval Moderate Complexity: 1 Procedure     PT G Codes:   PT G-Codes **NOT FOR INPATIENT CLASS** Functional Assessment Tool Used: clinical judgement Functional Limitation: Mobility: Walking and moving around Mobility: Walking and Moving Around Current Status [id number]): At least 40 percent but less than 60 percent impaired, limited or restricted Mobility: Walking and Moving Around Goal Status [phone]): At least 1 percent but less than 20 percent impaired, limited or restricted    Lorriane Shire 12/25/2015, 9:49 AM

## 2015-12-25 NOTE — Progress Notes (Signed)
Lab called for critical lactic acid of 2.2, Dr. Irine Seal notified via [place].

## 2015-12-25 NOTE — NC FL2 (Signed)
[place] LEVEL OF CARE SCREENING TOOL     IDENTIFICATION  Patient Name: [name] Birthdate: 1936/03/10 Sex: male Admission Date (Current Location): 12/24/2015  [facility] and [place] Number:  Herbalist and Address:  [facility], [place] [address], [place], [place] [address]      Provider Number: [id number]  Attending Physician Name and Address:  Eugenie Filler, MD  Relative Name and Phone Number:  [name] - nephew.  [phone] (h)   [phone] (c)     Current Level of Care: Home Recommended Level of Care: [place] Prior Approval Number:    Date Approved/Denied:   PASRR Number: [id number] (Eff. 09/12/11)  Discharge Plan: SNF    Current Diagnoses: Patient Active Problem List   Diagnosis Date Noted  . Weakness generalized 12/25/2015  . UTI (lower urinary tract infection) 12/24/2015  . Fracture of spinous process of cervical vertebra (HCC)   . Alcohol abuse with intoxication ([place])   . Coronary artery disease involving native coronary artery of native heart without angina pectoris   . Chronic systolic heart failure ([place])   . Low back pain   . Unstable angina ([place]) 04/07/2014  . Laceration 08/02/2013  . Unsteadiness on feet 07/30/2013  . Syncopal episodes 07/30/2013  . Anemia, iron deficiency 06/02/2013  . Hypomagnesemia 02/21/2013  . Anemia, chronic disease 02/21/2013  . Alcohol dependence ([place]) 10/25/2012    Class: Acute  . Alcohol withdrawal ([place]) 10/24/2012  . Chest pain at rest 10/20/2012  . Chest pain 10/04/2012  . Hyponatremia 09/13/2012  . Hypokalemia 09/13/2012  . Generalized weakness 09/10/2011  . GERD (gastroesophageal reflux disease) 05/14/2011  . UNSPECIFIED DEFICIENCY ANEMIA 04/24/2010  . At high risk for falls 04/24/2010  . Elevated bilirubin 03/30/2010  . Atrial fibrillation ([place]) 12/14/2009  . Chronic diastolic heart failure ([place]) 12/12/2009  . LBBB 04/26/2009  .  CONSTIPATION, CHRONIC 04/20/2009  . FAMILIAL TREMOR 03/09/2009  . INGUINAL HERNIA, RIGHT 05/05/2008  . MYELOPATHY OTHER DISEASES CLASSIFIED ELSEWHERE 08/27/2007  . DEPRESSION, CHRONIC 08/11/2007  . Coronary atherosclerosis 08/11/2007  . History of digestive system disease 02/12/2007  . Alcohol abuse 06/11/2006  . HYPERTROPHY PROSTATE BNG W/URINARY OBST/LUTS 06/11/2006  . ERECTILE DYSFUNCTION, ORGANIC 06/11/2006  . HYPERTRIGLYCERIDEMIA 05/29/2006  . HYPERTENSION, BENIGN SYSTEMIC 05/29/2006  . RHINITIS, ALLERGIC 05/29/2006  . REFLUX ESOPHAGITIS 05/29/2006  . BACK PAIN, LOW 05/29/2006  . PROSTATE CANCER, HX OF 05/29/2006    Orientation RESPIRATION BLADDER Height & Weight     Self, Time, Situation, Place  Normal Incontinent, External catheter Weight: 153 lb (69.4 kg) Height:  5\' 11"  (180.3 cm)  BEHAVIORAL SYMPTOMS/MOOD NEUROLOGICAL BOWEL NUTRITION STATUS      Continent Diet (Heart-healthy)  AMBULATORY STATUS COMMUNICATION OF NEEDS Skin   Extensive Assist (Patient declined transfers beyond edge of bed and ambulation with PT on 12/25/15) Verbally Normal                       Personal Care Assistance Level of Assistance  Bathing, Feeding, Dressing Bathing Assistance: Limited assistance Feeding assistance: Independent Dressing Assistance: Limited assistance     Functional Limitations Info  Sight, Hearing, Speech Sight Info: Adequate Hearing Info: Adequate Speech Info: Adequate    SPECIAL CARE FACTORS FREQUENCY  PT (By licensed PT)     PT Frequency: Evaluated 9/25 and a minimum of 3X per week therapy recommended  Contractures Contractures Info: Not present    Additional Factors Info  Code Status, Allergies Code Status Info: Full Allergies Info: No known allergies           Current Medications (12/25/2015):  This is the current hospital active medication list Current Facility-Administered Medications  Medication Dose Route Frequency Provider Last  Rate Last Dose  . 0.9 %  sodium chloride infusion   Intravenous Continuous Eugenie Filler, MD 75 mL/hr at 12/25/15 0845    . acetaminophen (TYLENOL) tablet 650 mg  650 mg Oral Q6H PRN Rise Patience, MD       Or  . acetaminophen (TYLENOL) suppository 650 mg  650 mg Rectal Q6H PRN Rise Patience, MD      . aspirin EC tablet 81 mg  81 mg Oral Daily Rise Patience, MD   81 mg at 12/25/15 1024  . atorvastatin (LIPITOR) tablet 40 mg  40 mg Oral q1800 Rise Patience, MD      . Derrill Memo ON 12/26/2015] cefTRIAXone (ROCEPHIN) 1 g in dextrose 5 % 50 mL IVPB  1 g Intravenous Q24H Laren Everts, RPH      . ferrous sulfate tablet 325 mg  325 mg Oral TID WC Rise Patience, MD   325 mg at 12/25/15 0842  . folic acid (FOLVITE) tablet 1 mg  1 mg Oral Daily Rise Patience, MD   1 mg at 12/25/15 1024  . LORazepam (ATIVAN) injection 0-4 mg  0-4 mg Intravenous Q6H Rise Patience, MD       Followed by  . [START ON 12/27/2015] LORazepam (ATIVAN) injection 0-4 mg  0-4 mg Intravenous Q12H Rise Patience, MD      . LORazepam (ATIVAN) tablet 1 mg  1 mg Oral Q6H PRN Rise Patience, MD       Or  . LORazepam (ATIVAN) injection 1 mg  1 mg Intravenous Q6H PRN Rise Patience, MD      . multivitamin with minerals tablet 1 tablet  1 tablet Oral Daily Rise Patience, MD   1 tablet at 12/25/15 1024  . nitroGLYCERIN (NITROSTAT) SL tablet 0.4 mg  0.4 mg Sublingual Q5 min PRN Rise Patience, MD      . ondansetron [facility]) tablet 4 mg  4 mg Oral Q6H PRN Rise Patience, MD       Or  . ondansetron [facility]) injection 4 mg  4 mg Intravenous Q6H PRN Rise Patience, MD      . potassium chloride 20 MEQ/15ML (10%) solution 40 mEq  40 mEq Oral Q4H Eugenie Filler, MD   40 mEq at 12/25/15 1000  . thiamine (VITAMIN B-1) tablet 100 mg  100 mg Oral Daily Rise Patience, MD   100 mg at 12/25/15 1024   Or  . thiamine (B-1) injection 100 mg  100 mg Intravenous Daily Rise Patience, MD         Discharge Medications: Please see discharge summary for a list of discharge medications.  Relevant Imaging Results:  Relevant Lab Results:   Additional Information [phone].  Sable Feil, LCSW

## 2015-12-25 NOTE — Progress Notes (Signed)
Pharmacy Antibiotic Note  KAHIL HEIS is a 80 y.o. male admitted on 12/24/2015 with UTI.  Pharmacy has been consulted for Rocephin dosing.  Plan: Rocephin 1g IV Q24H.  Pharmacy will sign off.  Height: 5\' 11"  (180.3 cm) Weight: 153 lb (69.4 kg) IBW/kg (Calculated) : 75.3  Temp (24hrs), Avg:98 F (36.7 C), Min:97.9 F (36.6 C), Max:98 F (36.7 C)   Recent Labs Lab 12/24/15 2114 12/25/15 0049  WBC 3.1*  --   CREATININE 0.76  --   LATICACIDVEN  --  4.94*    Estimated Creatinine Clearance: 75.9 mL/min (by C-G formula based on SCr of 0.76 mg/dL).    No Known Allergies   Thank you for allowing pharmacy to be a part of this patient's care.  Wynona Neat, PharmD, BCPS  12/25/2015 3:49 AM

## 2015-12-25 NOTE — Care Management Obs Status (Signed)
[place] NOTIFICATION   Patient Details  Name: [name] MRN: [id number] Date of Birth: 1935/05/21   Medicare Observation Status Notification Given:  Yes    Emmerson Taddei, Rory Percy, RN 12/25/2015, 11:02 AM

## 2015-12-25 NOTE — Progress Notes (Signed)
I have seen and assessed the patient and agree with Dr.Kakrakandy's assessment and plan. Vision is a 80 year old general history of ongoing alcohol abuse, coronary artery disease status post CABG, age of fibrillation, cardiomyopathy brought to the ED due to increasing weakness over the past 2 days prior to admission. Patient noted to have an EKG with left bundle branch block unchanged from prior EKG. Urinalysis was concerning for UTI. Patient was noted to be hypotensive on admission with blood pressure responding to fluid boluses. Patient was placed on IV antibiotics. Patient also placed on the Ativan withdrawal protocol.

## 2015-12-25 NOTE — Progress Notes (Signed)
New Admission Note:  Arrival Method: Stretcher Mental Orientation: Alert and oriented x 3-4 Telemetry: 04 Afib Assessment: Completed Skin: BUE ecchymosis IV: NSL left ac Pain: denies Tubes: n/a Safety Measures: Safety Fall Prevention Plan was given, discussed and signed. Admission: Completed [facility] [place] Orientation: Patient has been orientated to the room, unit and the staff. Family: none  Orders have been reviewed and implemented. Will continue to monitor the patient. Call light has been placed within reach and bed alarm has been activated.   Sima Matas BSN, RN  Phone Number: [phone]

## 2015-12-26 LAB — COMPREHENSIVE METABOLIC PANEL
ALBUMIN: 1.7 g/dL — AB (ref 3.5–5.0)
ALK PHOS: 84 U/L (ref 38–126)
ALT: 39 U/L (ref 17–63)
ANION GAP: 7 (ref 5–15)
AST: 70 U/L — ABNORMAL HIGH (ref 15–41)
BUN: 8 mg/dL (ref 6–20)
CALCIUM: 7.1 mg/dL — AB (ref 8.9–10.3)
CO2: 22 mmol/L (ref 22–32)
Chloride: 107 mmol/L (ref 101–111)
Creatinine, Ser: 0.67 mg/dL (ref 0.61–1.24)
GFR calc Af Amer: >60 mL/min (ref >60)
GFR calc non Af Amer: >60 mL/min (ref >60)
GLUCOSE: 85 mg/dL (ref 65–99)
Potassium: 3.2 mmol/L — ABNORMAL LOW (ref 3.5–5.1)
SODIUM: 136 mmol/L (ref 135–145)
Total Bilirubin: 1.2 mg/dL (ref 0.3–1.2)
Total Protein: 4.5 g/dL — ABNORMAL LOW (ref 6.5–8.1)

## 2015-12-26 LAB — CBC WITH DIFFERENTIAL/PLATELET
BASOS ABS: 0 10*3/uL (ref 0.0–0.1)
BASOS PCT: 0 %
Eosinophils Absolute: 0 10*3/uL (ref 0.0–0.7)
Eosinophils Relative: 1 %
HEMATOCRIT: 30.4 % — AB (ref 39.0–52.0)
HEMOGLOBIN: 10 g/dL — AB (ref 13.0–17.0)
Lymphocytes Relative: 32 %
Lymphs Abs: 0.7 10*3/uL (ref 0.7–4.0)
MCH: 35.1 pg — ABNORMAL HIGH (ref 26.0–34.0)
MCHC: 32.9 g/dL (ref 30.0–36.0)
MCV: 106.7 fL — ABNORMAL HIGH (ref 78.0–100.0)
MONO ABS: 0.2 10*3/uL (ref 0.1–1.0)
Monocytes Relative: 8 %
NEUTROS ABS: 1.3 10*3/uL — AB (ref 1.7–7.7)
NEUTROS PCT: 59 %
Platelets: 72 10*3/uL — ABNORMAL LOW (ref 150–400)
RBC: 2.85 MIL/uL — ABNORMAL LOW (ref 4.22–5.81)
RDW: 13.6 % (ref 11.5–15.5)
WBC: 2.2 10*3/uL — AB (ref 4.0–10.5)

## 2015-12-26 LAB — MAGNESIUM: Magnesium: 1.5 mg/dL — ABNORMAL LOW (ref 1.7–2.4)

## 2015-12-26 LAB — LACTIC ACID, PLASMA: Lactic Acid, Venous: 0.9 mmol/L (ref 0.5–1.9)

## 2015-12-26 MED ORDER — POTASSIUM CHLORIDE CRYS ER 20 MEQ PO TBCR
40.0000 meq | EXTENDED_RELEASE_TABLET | ORAL | Status: AC
  Administered 2015-12-26 (×2): 40 meq via ORAL
  Filled 2015-12-26 (×2): qty 2

## 2015-12-26 MED ORDER — MAGNESIUM SULFATE 4 GM/100ML IV SOLN
4.0000 g | Freq: Once | INTRAVENOUS | Status: AC
  Administered 2015-12-26: 4 g via INTRAVENOUS
  Filled 2015-12-26: qty 100

## 2015-12-26 NOTE — Progress Notes (Signed)
PROGRESS NOTE    [name]  [id number] DOB: Aug 24, 1935 DOA: 12/24/2015 PCP: [name], MD   Brief Narrative:  Patient is a 80 year old gentleman history of a: Abuse, chronic artery disease status post CABG, A. fib, cardiomyopathy presented to the ED with worsening weakness 2 days. EKG showed left bundle branch block with A. fib, elevated troponins were trended down, urinalysis concerning for UTI and hypotension. Blood pressure improved with IV fluids. Patient placed on IV Rocephin. Patient pancultured. 2-D echo pending.   Assessment & Plan:   Principal Problem:   Generalized weakness Active Problems:   Alcohol abuse   History of digestive system disease   Hyponatremia   Coronary artery disease involving native coronary artery of native heart without angina pectoris   UTI (lower urinary tract infection)   Weakness generalized   Abdominal distention   Leucocytosis  #1 generalized weakness Questionable etiology. May be secondary to UTI versus dehydration versus debility. Cardiac enzymes slightly elevated however trending down. 2-D echo pending. Urine cultures pending. Continue empiric IV Rocephin and IV fluids. PT/OT. Follow.  #2 UTI Urine cultures with greater than 100,000 colonies of Klebsiella pneumonia. Sensitivities pending. Continue IV Rocephin.  #3 Alcohol abuse Patient with no signs of DTs. Continue thiamine, folic acid, multivitamin. Continue the Ativan withdrawal protocol. Follow.  #4 leukopenia Maybe secondary to UTI. Patient has been pancultured. Continue empiric IV Rocephin. Follow.  #5 mild hyponatremia/hypomagnesemia/hypokalemia Likely secondary to dehydration and alcohol abuse. Replete potassium to keep around 4. Replete magnesium and keep at 2. Hyponatremia improved with hydration. Severe lock IV fluids. Follow.  #6 history of atrial fibrillation CHA2DS2VASC >2 Patient currently rate controlled. Patient likely not a candidate for anticoagulation  secondary to active alcohol use a risk for falls.  #7 chronic anemia Follow H&H.  #8 thrombocytopenia Likely secondary to alcohol abuse.  #9 history of coronary artery disease status post H/cardiomyopathy EF 30-35% per 2-D echo of generator 2016 Cardiac enzymes were initially elevated however trended down. Patient denies any active chest pain. 2-D echo pending. Continue aspirin, Lipitor.    DVT prophylaxis: SCDs Code Status: Full Family Communication: Updated patient. No family at bedside. Disposition Plan: Skilled nursing facility when medically stable.   Consultants:   None  Procedures:   CT head 12/24/2015  Chest x-ray 12/25/2015  Abdominal ultrasound 12/25/2015  Antimicrobials:   IV Rocephin 12/26/2015    Subjective: Patient laying in bed. No shortness of breath. No chest pain. Patient states still with some weakness. No tremors noted.  Objective: Vitals:   12/25/15 2251 12/26/15 0435 12/26/15 0924 12/26/15 1700  BP: (!) 112/55 119/63 120/66 117/67  Pulse: 61 74 71 81  Resp: 16 16 16 17   Temp: 97.9 F (36.6 C) 98.6 F (37 C) 98.3 F (36.8 C) 98.8 F (37.1 C)  TempSrc: Oral Oral Oral Oral  SpO2: 95% 96% 99% 98%  Weight:      Height:        Intake/Output Summary (Last 24 hours) at 12/26/15 1813 Last data filed at 12/26/15 1500  Gross per 24 hour  Intake          2658.75 ml  Output              380 ml  Net          2278.75 ml   Filed Weights   12/25/15 0218  Weight: 69.4 kg (153 lb)    Examination:  General exam: Appears calm and comfortable  Respiratory system: Clear to  auscultation. Respiratory effort normal. Cardiovascular system: S1 & S2 heard, RRR. No JVD, murmurs, rubs, gallops or clicks. No pedal edema. Gastrointestinal system: Abdomen is nondistended, soft and nontender. No organomegaly or masses felt. Normal bowel sounds heard. Central nervous system: Alert and oriented. No focal neurological deficits. Extremities: Symmetric 5 x 5  power. Skin: No rashes, lesions or ulcers Psychiatry: Judgement and insight appear normal. Mood & affect appropriate.     Data Reviewed: I have personally reviewed following labs and imaging studies  CBC:  Recent Labs Lab 12/24/15 2114 12/25/15 0420 12/26/15 1142  WBC 3.1* 1.9* 2.2*  NEUTROABS 1.6* 1.0* 1.3*  HGB 10.5* 11.1* 10.0*  HCT 30.9* 31.6* 30.4*  MCV 104.0* 104.6* 106.7*  PLT 86* 72* 72*   Basic Metabolic Panel:  Recent Labs Lab 12/24/15 2114 12/25/15 0420 12/25/15 1614 12/26/15 1142  NA 128* 132*  --  136  K 3.0* 3.1* 3.7 3.2*  CL 93* 101  --  107  CO2 22 22  --  22  GLUCOSE 86 69  --  85  BUN 5* 5*  --  8  CREATININE 0.76 0.59*  --  0.67  CALCIUM 7.5* 7.3*  --  7.1*  MG 1.3* 1.8  --  1.5*   GFR: Estimated Creatinine Clearance: 75.9 mL/min (by C-G formula based on SCr of 0.67 mg/dL). Liver Function Tests:  Recent Labs Lab 12/24/15 2114 12/25/15 0420 12/26/15 1142  AST 86* 85* 70*  ALT 45 47 39  ALKPHOS 87 92 84  BILITOT 1.1 1.0 1.2  PROT 5.1* 5.3* 4.5*  ALBUMIN 1.9* 2.0* 1.7*   No results for input(s): LIPASE, AMYLASE in the last 168 hours.  Recent Labs Lab 12/25/15 0947  AMMONIA 31   Coagulation Profile: No results for input(s): INR, PROTIME in the last 168 hours. Cardiac Enzymes:  Recent Labs Lab 12/24/15 2114 12/25/15 0420 12/25/15 0947 12/25/15 1614  TROPONINI 0.13* 0.13* 0.10* 0.06*   BNP (last 3 results) No results for input(s): PROBNP in the last 8760 hours. HbA1C: No results for input(s): HGBA1C in the last 72 hours. CBG: No results for input(s): GLUCAP in the last 168 hours. Lipid Profile: No results for input(s): CHOL, HDL, LDLCALC, TRIG, CHOLHDL, LDLDIRECT in the last 72 hours. Thyroid Function Tests:  Recent Labs  12/24/15 2254  TSH 2.098   Anemia Panel: No results for input(s): VITAMINB12, FOLATE, FERRITIN, TIBC, IRON, RETICCTPCT in the last 72 hours. Sepsis Labs:  Recent Labs Lab 12/25/15 0049  12/25/15 0420 12/25/15 0947 12/26/15 1142  PROCALCITON  --  0.20  --   --   LATICACIDVEN 4.94* 3.4* 2.2* 0.9    Recent Results (from the past 240 hour(s))  Urine culture     Status: Abnormal (Preliminary result)   Collection Time: 12/24/15  9:26 PM  Result Value Ref Range Status   Specimen Description URINE, RANDOM  Final   Special Requests NONE  Final   Culture (A)  Final    >=100,000 COLONIES/mL KLEBSIELLA PNEUMONIAE SUSCEPTIBILITIES TO FOLLOW    Report Status PENDING  Incomplete  Culture, blood (Routine X 2) w Reflex to ID Panel     Status: None (Preliminary result)   Collection Time: 12/25/15  4:20 AM  Result Value Ref Range Status   Specimen Description BLOOD RIGHT ARM  Final   Special Requests BOTTLES DRAWN AEROBIC AND ANAEROBIC 5CC  Final   Culture NO GROWTH 1 DAY  Final   Report Status PENDING  Incomplete  Culture, blood (Routine X  2) w Reflex to ID Panel     Status: None (Preliminary result)   Collection Time: 12/25/15  4:35 AM  Result Value Ref Range Status   Specimen Description BLOOD LEFT ARM  Final   Special Requests IN PEDIATRIC BOTTLE 1CC  Final   Culture NO GROWTH 1 DAY  Final   Report Status PENDING  Incomplete         Radiology Studies: Ct Head Wo Contrast  Result Date: 12/24/2015 CLINICAL DATA:  80 year old male with generalized weakness. History of prostate cancer. EXAM: CT HEAD WITHOUT CONTRAST TECHNIQUE: Contiguous axial images were obtained from the base of the skull through the vertex without intravenous contrast. COMPARISON:  Head CT dated 01/15/2015 FINDINGS: Brain: There is moderate age-related atrophy and chronic microvascular ischemic changes. A 15 x 11 mm focal right cerebellar hemisphere old infarct and encephalomalacia. There is no acute intracranial hemorrhage. No mass effect or midline shift noted. No extra-axial fluid collection. Vascular: No hyperdense vessel or unexpected calcification. Skull: Normal. Negative for fracture or focal  lesion. Sinuses/Orbits: No acute finding. Other: None IMPRESSION: No acute intracranial hemorrhage. Age-related atrophy and chronic microvascular ischemic disease. If symptoms persist and there are no contraindications, MRI may provide better evaluation if clinically indicated. Electronically Signed   By: Anner Crete M.D.   On: 12/24/2015 23:26   US Abdomen Complete  Result Date: 12/25/2015 CLINICAL DATA:  Patient with history of alcohol abuse. History of pancreatitis. EXAM: ABDOMEN ULTRASOUND COMPLETE COMPARISON:  CT abdomen pelvis 02/21/2013 FINDINGS: Gallbladder: No gallstones or wall thickening visualized. No sonographic Murphy sign noted by sonographer. Common bile duct: Diameter: 5.6 mm Liver: Diffusely increased in echogenicity. No focal lesion identified. IVC: No abnormality visualized. Pancreas: Visualized portion unremarkable. Spleen: Size and appearance within normal limits. Right Kidney: Length: 11.6 cm. Echogenicity within normal limits. No mass or hydronephrosis visualized. Left Kidney: Length: 10.8 cm. Echogenicity within normal limits. No mass or hydronephrosis visualized. Abdominal aorta: No aneurysm visualized. Other findings: Small amount of ascites. Bilateral pleural effusions. IMPRESSION: Diffusely increased hepatic parenchymal echogenicity compatible with steatosis. Electronically Signed   By: Lovey Newcomer M.D.   On: 12/25/2015 09:12   Dg Chest Port 1 View  Result Date: 12/25/2015 CLINICAL DATA:  Leukocytosis. EXAM: PORTABLE CHEST 1 VIEW COMPARISON:  04/06/2014. FINDINGS: Prior CABG. Cardiomegaly with bilateral from interstitial prominence suggesting congestive heart failure. Small bilateral pleural effusions cannot be excluded. No pneumothorax IMPRESSION: 1. Prior CABG. 2. Cardiomegaly with bilateral from interstitial prominence consistent congestive heart failure. Small bilateral pleural effusions cannot be excluded. Electronically Signed   By: Marcello Moores  Register   On: 12/25/2015  07:13        Scheduled Meds: . aspirin EC  81 mg Oral Daily  . atorvastatin  40 mg Oral q1800  . cefTRIAXone (ROCEPHIN)  IV  1 g Intravenous Q24H  . ferrous sulfate  325 mg Oral TID WC  . folic acid  1 mg Oral Daily  . LORazepam  0-4 mg Intravenous Q6H   Followed by  . [START ON 12/27/2015] LORazepam  0-4 mg Intravenous Q12H  . multivitamin with minerals  1 tablet Oral Daily  . thiamine  100 mg Oral Daily   Continuous Infusions: . sodium chloride 75 mL/hr at 12/26/15 1435     LOS: 1 day    Time spent: 13 mins    THOMPSON,DANIEL, MD [facility] Pager [phone]  If 7PM-7AM, please contact night-coverage [email or web address] Password [facility] 12/26/2015, 6:13 PM

## 2015-12-26 NOTE — Clinical Social Work Placement (Signed)
   CLINICAL SOCIAL WORK PLACEMENT  NOTE  Date:  12/26/2015  Patient Details  Name: [name] MRN: [id number] Date of Birth: 07/30/1935  Clinical Social Work is seeking post-discharge placement for this patient at the [place] level of care (*CSW will initial, date and re-position this form in  chart as items are completed):  Yes   Patient/family provided with [facility] Work Department's list of facilities offering this level of care within the geographic area requested by the patient (or if unable, by the patient's family).  Yes   Patient/family informed of their freedom to choose among providers that offer the needed level of care, that participate in Medicare, Medicaid or managed care program needed by the patient, have an available bed and are willing to accept the patient.  Yes   Patient/family informed of [facility]'s ownership interest in [facility] and [facility], as well as of the fact that they are under no obligation to receive care at these facilities.  PASRR submitted to EDS on       PASRR number received on       Existing PASRR number confirmed on 12/26/15     FL2 transmitted to all facilities in geographic area requested by pt/family on 12/26/15     FL2 transmitted to all facilities within larger geographic area on       Patient informed that his/her managed care company has contracts with or will negotiate with certain facilities, including the following:            Patient/family informed of bed offers received.  Patient chooses bed at       Physician recommends and patient chooses bed at      Patient to be transferred to   on  .  Patient to be transferred to facility by       Patient family notified on   of transfer.  Name of family member notified:        PHYSICIAN       Additional Comment:    _______________________________________________ Sable Feil, LCSW 12/26/2015, 4:55 PM

## 2015-12-26 NOTE — Progress Notes (Addendum)
Physical Therapy Treatment Patient Details Name: ABIMAEL DOHRN MRN: [id number] DOB: Jan 25, 1936 Today's Date: 12/26/2015    History of Present Illness 80 y.o. male with PMH of alcohol abuse, CAD status post CABG, atrial fibrillation, and cardiomyopathy. He was brought to the ER, after the patient was found to be increasingly weak over the last 2 days.    PT Comments    Slowly progressing with mobility, pt stood for 10 seconds x 2 trials. Standing tolerance limited by lightheadedness and BLE fatigue. Pt unable to stand long enough to get standing BP, no systolic drop with supine (124/83) to sitting (125/78) BPs. He declined ambulation 2* fatigue.    Follow Up Recommendations  SNF     Equipment Recommendations  Rolling walker with 5" wheels    Recommendations for Other Services       Precautions / Restrictions Precautions Precautions: Fall Precaution Comments: h/o falls at home Restrictions Weight Bearing Restrictions: No    Mobility  Bed Mobility Overal bed mobility: Needs Assistance Bed Mobility: Supine to Sit;Sit to Supine     Supine to sit: Min assist Sit to supine: Min assist   General bed mobility comments: +rail, verbal cues for technique, assist to elevate trunk  Transfers Overall transfer level: Needs assistance Equipment used: Rolling walker (2 wheeled) Transfers: Sit to/from Stand Sit to Stand: Min assist;From elevated surface         General transfer comment: attempted sit to stand with cane, then with RW, stood only 10 seconds each trial, standing tolerance limited by BLE fatigue and lightheadedness. Pt not able to stand long enough to get standing BP. Supine 124/83, sit 125/78.   Ambulation/Gait             General Gait Details: not tested pt declined 2* BLE fatigue with standing   Stairs            Wheelchair Mobility    Modified Rankin (Stroke Patients Only)       Balance   Sitting-balance support: Feet supported;Bilateral  upper extremity supported Sitting balance-Leahy Scale: Good Sitting balance - Comments: sat EOB x 10 min without LOB     Standing balance-Leahy Scale: Poor                      Cognition Arousal/Alertness: Awake/alert Behavior During Therapy: WFL for tasks assessed/performed Overall Cognitive Status: Within Functional Limits for tasks assessed                      Exercises      General Comments        Pertinent Vitals/Pain Pain Assessment: No/denies pain    Home Living                      Prior Function            PT Goals (current goals can now be found in the care plan section) Acute Rehab PT Goals Patient Stated Goal: to be able to walk PT Goal Formulation: With patient Time For Goal Achievement: 01/08/16 Potential to Achieve Goals: Good Progress towards PT goals: Progressing toward goals    Frequency    Min 3X/week      PT Plan Current plan remains appropriate    Co-evaluation             End of Session Equipment Utilized During Treatment: Gait belt Activity Tolerance: Patient limited by fatigue Patient left: in bed;with bed alarm set;with  call bell/phone within reach     Time: 1304-1336 PT Time Calculation (min) (ACUTE ONLY): 32 min  Charges:  $Therapeutic Activity: 23-37 mins                    G Codes:      Philomena Doheny 12/26/2015, 1:44 PM [phone]

## 2015-12-26 NOTE — Clinical Social Work Note (Signed)
Clinical Social Work Assessment  Patient Details  Name: [name] MRN: [id number] Date of Birth: 03-03-1936  Date of referral:  12/26/15               Reason for consult:  Facility Placement                Permission sought to share information with:  Family Supports Permission granted to share information::  Yes, Verbal Permission Granted  Name::     [name]   Agency::     Relationship::  Friend  Contact Information:  [phone]  Housing/Transportation Living arrangements for the past 2 months:  Single Family Home Source of Information:  Patient Patient Interpreter Needed:  None Criminal Activity/Legal Involvement Pertinent to Current Situation/Hospitalization:  No - Comment as needed Significant Relationships:  Friend, Other Family Members (Niece [name] and nephew [name]) Lives with:  Friends (Patient friend, [name] lives with him) Do you feel safe going back to the place where you live?  Yes (Patient feels safe at home and indicated that he has his friend and niece [name] that help him, however he is in agreement with ST rehab.) Need for family participation in patient care:  Yes (Comment)  Care giving concerns:  Patient reported that he feels safe at home as his friend [name] who lives with him does help him out when needed. He added that his niece [name] will come and assist him if called. Patient has never been to rehab and this was discussed along with the SNF search process.    Social Worker assessment / plan:  CSW visited with patient at the bedside to discuss his discharge plan. [name] was lying in bed, awake, alert, pleasant and easily engaged with CSW in a conversation regarding discharging to a skilled facility.  While discussing his support system, [name] was asked about children and responded yes, explaining that he has no contact with his children (2 boys and 1 girl) as he has been estranged from them pretty much since he  and his wife broke-up and the children went with her.   Patient reported that he has never been to rehab and this was discussed along with the SNF search process and his questions answered. [name] was also provided with skilled facility list for [facility].   Employment status:  Retired Forensic scientist:  Biomedical scientist) PT Recommendations:  [place] / Referral to community resources:  [place] (Patient provided with SNF list for [facility])  Patient/Family's Response to care:  [name] did not express any concerns regarding his care during hospitalization.  Patient/Family's Understanding of and Emotional Response to Diagnosis, Current Treatment, and Prognosis:  Not discussed.  Emotional Assessment Appearance:  Appears stated age Attitude/Demeanor/Rapport:  Other (appropriate) Affect (typically observed):  Appropriate, Pleasant Orientation:  Oriented to Self, Oriented to Place, Oriented to  Time, Oriented to Situation Alcohol / Substance use:  Tobacco Use, Alcohol Use, Illicit Drugs (Patient reports that he quit smoking, does drink beer and wine and does not use illicit drugs) Psych involvement (Current and /or in the community):  No (Comment)  Discharge Needs  Concerns to be addressed:  Discharge Planning Concerns Readmission within the last 30 days:  No Current discharge risk:  None Barriers to Discharge:  Continued Medical Work up   [employer] [name], [place] 12/26/2015, 4:08 PM

## 2015-12-27 ENCOUNTER — Inpatient Hospital Stay (HOSPITAL_COMMUNITY): Payer: Medicare Other

## 2015-12-27 DIAGNOSIS — R140 Abdominal distension (gaseous): Secondary | ICD-10-CM

## 2015-12-27 DIAGNOSIS — F1010 Alcohol abuse, uncomplicated: Secondary | ICD-10-CM

## 2015-12-27 DIAGNOSIS — I2510 Atherosclerotic heart disease of native coronary artery without angina pectoris: Secondary | ICD-10-CM

## 2015-12-27 DIAGNOSIS — R531 Weakness: Secondary | ICD-10-CM

## 2015-12-27 DIAGNOSIS — R7989 Other specified abnormal findings of blood chemistry: Secondary | ICD-10-CM

## 2015-12-27 DIAGNOSIS — Z8719 Personal history of other diseases of the digestive system: Secondary | ICD-10-CM

## 2015-12-27 LAB — BASIC METABOLIC PANEL
Anion gap: 8 (ref 5–15)
BUN: 8 mg/dL (ref 6–20)
CALCIUM: 7.3 mg/dL — AB (ref 8.9–10.3)
CO2: 19 mmol/L — ABNORMAL LOW (ref 22–32)
CREATININE: 0.65 mg/dL (ref 0.61–1.24)
Chloride: 109 mmol/L (ref 101–111)
GFR calc Af Amer: >60 mL/min (ref >60)
GLUCOSE: 83 mg/dL (ref 65–99)
Potassium: 3.9 mmol/L (ref 3.5–5.1)
SODIUM: 136 mmol/L (ref 135–145)

## 2015-12-27 LAB — URINE CULTURE: Culture: >=100000 — AB

## 2015-12-27 LAB — CBC
HCT: 32.1 % — ABNORMAL LOW (ref 39.0–52.0)
Hemoglobin: 10.5 g/dL — ABNORMAL LOW (ref 13.0–17.0)
MCH: 35.5 pg — AB (ref 26.0–34.0)
MCHC: 32.7 g/dL (ref 30.0–36.0)
MCV: 108.4 fL — AB (ref 78.0–100.0)
PLATELETS: 79 10*3/uL — AB (ref 150–400)
RBC: 2.96 MIL/uL — AB (ref 4.22–5.81)
RDW: 13.8 % (ref 11.5–15.5)
WBC: 2.6 10*3/uL — AB (ref 4.0–10.5)

## 2015-12-27 LAB — PROCALCITONIN: PROCALCITONIN: 0.17 ng/mL

## 2015-12-27 LAB — MAGNESIUM: Magnesium: 2 mg/dL (ref 1.7–2.4)

## 2015-12-27 MED ORDER — FUROSEMIDE 10 MG/ML IJ SOLN
40.0000 mg | Freq: Once | INTRAMUSCULAR | Status: AC
  Administered 2015-12-27: 40 mg via INTRAVENOUS
  Filled 2015-12-27: qty 4

## 2015-12-27 MED ORDER — POTASSIUM CHLORIDE CRYS ER 20 MEQ PO TBCR
40.0000 meq | EXTENDED_RELEASE_TABLET | Freq: Once | ORAL | Status: AC
  Administered 2015-12-27: 40 meq via ORAL
  Filled 2015-12-27: qty 2

## 2015-12-27 MED ORDER — PERFLUTREN LIPID MICROSPHERE
INTRAVENOUS | Status: AC
  Filled 2015-12-27: qty 10

## 2015-12-27 MED ORDER — CEFUROXIME AXETIL 500 MG PO TABS
500.0000 mg | ORAL_TABLET | Freq: Two times a day (BID) | ORAL | Status: DC
  Administered 2015-12-27 – 2015-12-28 (×2): 500 mg via ORAL
  Filled 2015-12-27 (×2): qty 1

## 2015-12-27 MED ORDER — PERFLUTREN LIPID MICROSPHERE
1.0000 mL | INTRAVENOUS | Status: AC | PRN
  Administered 2015-12-27: 3 mL via INTRAVENOUS
  Filled 2015-12-27: qty 10

## 2015-12-27 NOTE — Progress Notes (Signed)
Echocardiogram 2D Echocardiogram has been performed with definity.  [name] 12/27/2015, 3:18 PM

## 2015-12-27 NOTE — Progress Notes (Signed)
Patient keeps taking tele off. "I am hot." I dont wear t shirt at home. "  Removed telemetry leads off x 4.

## 2015-12-27 NOTE — Progress Notes (Signed)
PROGRESS NOTE    [name]  [id number] DOB: 1935/09/15 DOA: 12/24/2015 PCP: [name], MD   Subjective: Doing okay, denies any complaints.  Brief Narrative:  Patient is a 80 year old gentleman history of alcohol Abuse, chronic artery disease status post CABG, A. fib, cardiomyopathy presented to the ED with worsening weakness 2 days. EKG showed left bundle branch block with A. fib, elevated troponins were trended down, urinalysis concerning for UTI and hypotension. Blood pressure improved with IV fluids. Patient placed on IV Rocephin. Patient pancultured. 2-D echo pending.  Assessment & Plan:   Principal Problem:   Generalized weakness Active Problems:   Alcohol abuse   History of digestive system disease   Hyponatremia   Coronary artery disease involving native coronary artery of native heart without angina pectoris   UTI (lower urinary tract infection)   Weakness generalized   Abdominal distention   Leucocytosis   Generalized weakness -Likely multifactorial, but too big stand secondary to UTI, other factors including dehydration and poor oral intake. -This is improving but still weak cutting get out of the bed on his own. -Seen by PT and recommended skilled nursing facility.  #2 UTI -Klebsiella pneumoniae UTI, was on IV Rocephin. -I will switch to oral Ceftin.  #3 Alcohol abuse Patient with no signs of DTs. Continue thiamine, folic acid, multivitamin. Continue the Ativan withdrawal protocol. Follow.  #4 leukopenia Maybe secondary to UTI. Patient has been pancultured. Continue empiric IV Rocephin. Follow.  #5 mild hyponatremia/hypomagnesemia/hypokalemia Likely secondary to dehydration and alcohol abuse. Replete potassium to keep around 4. Replete magnesium and keep at 2. This is likely secondary to [facility] due to alcohol.  #6 history of atrial fibrillation CHA2DS2VASC >2 Patient currently rate controlled. Patient likely not a candidate for  anticoagulation secondary to active alcohol use a risk for falls.  #7 chronic anemia Follow H&H.  #8 thrombocytopenia Likely secondary to alcohol abuse.  #9 history of coronary artery disease status post H/cardiomyopathy EF 30-35% per 2-D echo in January of 2016 Cardiac enzymes were initially elevated however trended down. Patient denies any active chest pain. 2-D echo pending. Continue aspirin, Lipitor.  Lower extremity edema -Left more than right, elevate extremities, 1 dose of Lasix. Check BMP in a.m.  DVT prophylaxis: SCDs Code Status: Full Family Communication: Updated patient. No family at bedside. Disposition Plan: Skilled nursing facility when medically stable.   Consultants:   None  Procedures:   CT head 12/24/2015  Chest x-ray 12/25/2015  Abdominal ultrasound 12/25/2015  Antimicrobials:   IV Rocephin 12/26/2015    Objective: Vitals:   12/26/15 1700 12/26/15 2107 12/27/15 0653 12/27/15 0915  BP: 117/67 117/80 135/82 117/64  Pulse: 81 81 93 79  Resp: 17 16 16 16   Temp: 98.8 F (37.1 C) 98.3 F (36.8 C) 97.9 F (36.6 C) 97.2 F (36.2 C)  TempSrc: Oral Oral Oral Oral  SpO2: 98% 97% 99% 96%  Weight:  74.6 kg (164 lb 7.4 oz)    Height:        Intake/Output Summary (Last 24 hours) at 12/27/15 1208 Last data filed at 12/27/15 0930  Gross per 24 hour  Intake              985 ml  Output              350 ml  Net              635 ml   Filed Weights   12/25/15 0218 12/26/15 2107  Weight:  69.4 kg (153 lb) 74.6 kg (164 lb 7.4 oz)    Examination:  General exam: Appears calm and comfortable  Respiratory system: Clear to auscultation. Respiratory effort normal. Cardiovascular system: S1 & S2 heard, RRR. No JVD, murmurs, rubs, gallops or clicks. No pedal edema. Gastrointestinal system: Abdomen is nondistended, soft and nontender. No organomegaly or masses felt. Normal bowel sounds heard. Central nervous system: Alert and oriented. No focal neurological  deficits. Extremities: Symmetric 5 x 5 power. Skin: No rashes, lesions or ulcers Psychiatry: Judgement and insight appear normal. Mood & affect appropriate.     Data Reviewed: I have personally reviewed following labs and imaging studies  CBC:  Recent Labs Lab 12/24/15 2114 12/25/15 0420 12/26/15 1142 12/27/15 0726  WBC 3.1* 1.9* 2.2* 2.6*  NEUTROABS 1.6* 1.0* 1.3*  --   HGB 10.5* 11.1* 10.0* 10.5*  HCT 30.9* 31.6* 30.4* 32.1*  MCV 104.0* 104.6* 106.7* 108.4*  PLT 86* 72* 72* 79*   Basic Metabolic Panel:  Recent Labs Lab 12/24/15 2114 12/25/15 0420 12/25/15 1614 12/26/15 1142 12/27/15 0726  NA 128* 132*  --  136 136  K 3.0* 3.1* 3.7 3.2* 3.9  CL 93* 101  --  107 109  CO2 22 22  --  22 19*  GLUCOSE 86 69  --  85 83  BUN 5* 5*  --  8 8  CREATININE 0.76 0.59*  --  0.67 0.65  CALCIUM 7.5* 7.3*  --  7.1* 7.3*  MG 1.3* 1.8  --  1.5* 2.0   GFR: Estimated Creatinine Clearance: 81.6 mL/min (by C-G formula based on SCr of 0.65 mg/dL). Liver Function Tests:  Recent Labs Lab 12/24/15 2114 12/25/15 0420 12/26/15 1142  AST 86* 85* 70*  ALT 45 47 39  ALKPHOS 87 92 84  BILITOT 1.1 1.0 1.2  PROT 5.1* 5.3* 4.5*  ALBUMIN 1.9* 2.0* 1.7*   No results for input(s): LIPASE, AMYLASE in the last 168 hours.  Recent Labs Lab 12/25/15 0947  AMMONIA 31   Coagulation Profile: No results for input(s): INR, PROTIME in the last 168 hours. Cardiac Enzymes:  Recent Labs Lab 12/24/15 2114 12/25/15 0420 12/25/15 0947 12/25/15 1614  TROPONINI 0.13* 0.13* 0.10* 0.06*   BNP (last 3 results) No results for input(s): PROBNP in the last 8760 hours. HbA1C: No results for input(s): HGBA1C in the last 72 hours. CBG: No results for input(s): GLUCAP in the last 168 hours. Lipid Profile: No results for input(s): CHOL, HDL, LDLCALC, TRIG, CHOLHDL, LDLDIRECT in the last 72 hours. Thyroid Function Tests:  Recent Labs  12/24/15 2254  TSH 2.098   Anemia Panel: No results for  input(s): VITAMINB12, FOLATE, FERRITIN, TIBC, IRON, RETICCTPCT in the last 72 hours. Sepsis Labs:  Recent Labs Lab 12/25/15 0049 12/25/15 0420 12/25/15 0947 12/26/15 1142 12/27/15 0726  PROCALCITON  --  0.20  --   --  0.17  LATICACIDVEN 4.94* 3.4* 2.2* 0.9  --     Recent Results (from the past 240 hour(s))  Urine culture     Status: Abnormal   Collection Time: 12/24/15  9:26 PM  Result Value Ref Range Status   Specimen Description URINE, RANDOM  Final   Special Requests NONE  Final   Culture >=100,000 COLONIES/mL KLEBSIELLA PNEUMONIAE (A)  Final   Report Status 12/27/2015 FINAL  Final   Organism ID, Bacteria KLEBSIELLA PNEUMONIAE (A)  Final      Susceptibility   Klebsiella pneumoniae - MIC*    AMPICILLIN >=32 RESISTANT Resistant  CEFAZOLIN <=4 SENSITIVE Sensitive     CEFTRIAXONE <=1 SENSITIVE Sensitive     CIPROFLOXACIN <=0.25 SENSITIVE Sensitive     GENTAMICIN <=1 SENSITIVE Sensitive     IMIPENEM 0.5 SENSITIVE Sensitive     NITROFURANTOIN 32 SENSITIVE Sensitive     TRIMETH/SULFA <=20 SENSITIVE Sensitive     AMPICILLIN/SULBACTAM 4 SENSITIVE Sensitive     PIP/TAZO <=4 SENSITIVE Sensitive     Extended ESBL NEGATIVE Sensitive     * >=100,000 COLONIES/mL KLEBSIELLA PNEUMONIAE  Culture, blood (Routine X 2) w Reflex to ID Panel     Status: None (Preliminary result)   Collection Time: 12/25/15  4:20 AM  Result Value Ref Range Status   Specimen Description BLOOD RIGHT ARM  Final   Special Requests BOTTLES DRAWN AEROBIC AND ANAEROBIC 5CC  Final   Culture NO GROWTH 1 DAY  Final   Report Status PENDING  Incomplete  Culture, blood (Routine X 2) w Reflex to ID Panel     Status: None (Preliminary result)   Collection Time: 12/25/15  4:35 AM  Result Value Ref Range Status   Specimen Description BLOOD LEFT ARM  Final   Special Requests IN PEDIATRIC BOTTLE 1CC  Final   Culture NO GROWTH 1 DAY  Final   Report Status PENDING  Incomplete         Radiology Studies: No results  found.      Scheduled Meds: . aspirin EC  81 mg Oral Daily  . atorvastatin  40 mg Oral q1800  . cefTRIAXone (ROCEPHIN)  IV  1 g Intravenous Q24H  . ferrous sulfate  325 mg Oral TID WC  . folic acid  1 mg Oral Daily  . LORazepam  0-4 mg Intravenous Q12H  . multivitamin with minerals  1 tablet Oral Daily  . thiamine  100 mg Oral Daily   Continuous Infusions:     LOS: 2 days    Time spent: 35 mins    Laquesha Holcomb A, MD [facility] Pager [phone]  If 7PM-7AM, please contact night-coverage [email or web address] Password [facility] 12/27/2015, 12:08 PM

## 2015-12-27 NOTE — Clinical Social Work Note (Signed)
CSW talked with patient's friend, [name] [phone]) regarding the discharge recommendation for [name] and his desire now to discharge home once medically stable. Ms. [name] would like patient to discharge home, but indicated that she would not be able to care for him if he needs help getting up, transferring, etc., as she has her own health issues. Ms. [name] did report that patient has 2 nieces that assist him when needed but they work during the day.   Ms. [name] is in agreement with ST rehab and was provided with facility responses. Her choices are (1) [facility] and (2) [facility]. Ms. [name] agreed to talk with patient and help him understand that ST rehab is best for him at discharge. CSW will continue to follow patient's progress, provide support to patient and his friend, Ms. [name] and facilitate discharge to a skilled facility once medically stable.  Rivka Baune Givens, MSW, LCSW Licensed Clinical Social Worker [place] [phone]

## 2015-12-27 NOTE — Clinical Social Work Note (Signed)
Visited with patient to provide facility responses for ST rehab. [name] indicated that he wants to go home, and when asked, said his 'wife' will be with him. [name] refers to his Ms. [name] as his wife and reported that they have been together for awhile. CSW attempted to reach [name] [phone] but could not leave a voicemail. CSW informed patient that I will continue to try and reach [name] to assure that he will have the care and help he needs at discharge.  [name], MSW, LCSW Licensed Clinical Social Worker [place] [phone]

## 2015-12-28 DIAGNOSIS — N390 Urinary tract infection, site not specified: Principal | ICD-10-CM

## 2015-12-28 DIAGNOSIS — D72829 Elevated white blood cell count, unspecified: Secondary | ICD-10-CM

## 2015-12-28 LAB — BASIC METABOLIC PANEL
ANION GAP: 4 — AB (ref 5–15)
BUN: 7 mg/dL (ref 6–20)
CO2: 23 mmol/L (ref 22–32)
Calcium: 7.5 mg/dL — ABNORMAL LOW (ref 8.9–10.3)
Chloride: 110 mmol/L (ref 101–111)
Creatinine, Ser: 0.72 mg/dL (ref 0.61–1.24)
GFR calc Af Amer: >60 mL/min (ref >60)
GLUCOSE: 97 mg/dL (ref 65–99)
POTASSIUM: 3.7 mmol/L (ref 3.5–5.1)
Sodium: 137 mmol/L (ref 135–145)

## 2015-12-28 LAB — ECHOCARDIOGRAM COMPLETE
Height: 71 in
Weight: 2631.41 oz

## 2015-12-28 MED ORDER — CEFUROXIME AXETIL 500 MG PO TABS: 500.0000 mg | ORAL_TABLET | Freq: Two times a day (BID) | ORAL | Status: DC

## 2015-12-28 MED ORDER — CARVEDILOL 3.125 MG PO TABS: 3.1250 mg | ORAL_TABLET | Freq: Two times a day (BID) | ORAL | Status: DC

## 2015-12-28 NOTE — Progress Notes (Signed)
[name] to be D/C'd Skilled nursing facility per MD order.  Discussed prescriptions and follow up appointments with the patient. Prescriptions given to patient, medication list explained in detail. Pt verbalized understanding.    Medication List    STOP taking these medications   metoprolol tartrate 25 MG tablet Commonly known as:  LOPRESSOR     TAKE these medications   aspirin EC 81 MG tablet Take 1 tablet (81 mg total) by mouth daily.   atorvastatin 40 MG tablet Commonly known as:  LIPITOR TAKE 1 TABLET BY MOUTH EVERY DAY   carvedilol 3.125 MG tablet Commonly known as:  COREG Take 1 tablet (3.125 mg total) by mouth 2 (two) times daily with a meal.   cefUROXime 500 MG tablet Commonly known as:  CEFTIN Take 1 tablet (500 mg total) by mouth 2 (two) times daily with a meal.   ferrous sulfate 325 (65 FE) MG tablet TAKE 1 TABLET BY MOUTH EVERY DAY WITH BREAKFAST   hydrochlorothiazide 25 MG tablet Commonly known as:  HYDRODIURIL Take 25 mg by mouth daily.   lisinopril 5 MG tablet Commonly known as:  PRINIVIL,ZESTRIL TAKE 1 TABLET BY MOUTH DAILY   multivitamin with minerals Tabs tablet Take 1 tablet by mouth daily.   NITROSTAT 0.4 MG SL tablet Generic drug:  nitroGLYCERIN PLACE 1 TABLET UNDER THE TONGUE EVERY 5 MINUTES AS NEEDED FOR CHEST PAIN AS DIRECTED       Vitals:   12/28/15 0556 12/28/15 0854  BP: 130/75 116/67  Pulse: 81 100  Resp: 17 17  Temp: 98.1 F (36.7 C) 98.2 F (36.8 C)    Skin clean, dry and intact without evidence of skin break down, no evidence of skin tears noted. IV catheter discontinued intact. Site without signs and symptoms of complications. Dressing and pressure applied. Pt denies pain at this time. No complaints noted.  An After Visit Summary was printed and given to the patient. Patient escorted via stretcher, and D/C home ambulance.  [name] BSN, RN

## 2015-12-28 NOTE — Progress Notes (Signed)
Physical Therapy Treatment Patient Details Name: [name] MRN: [id number] DOB: 01/05/1936 Today's Date: 12/28/2015    History of Present Illness 80 y.o. male with PMH of alcohol abuse, CAD status post CABG, atrial fibrillation, and cardiomyopathy. He was brought to the ER, after the patient was found to be increasingly weak over the last 2 days.    PT Comments    Patient making slow progress with mobility.  Agree with need for SNF prior to return home.  Follow Up Recommendations  SNF     Equipment Recommendations  Rolling walker with 5" wheels    Recommendations for Other Services       Precautions / Restrictions Precautions Precautions: Fall Precaution Comments: h/o falls at home Restrictions Weight Bearing Restrictions: No    Mobility  Bed Mobility Overal bed mobility: Needs Assistance Bed Mobility: Supine to Sit;Sit to Supine     Supine to sit: Min assist Sit to supine: Min assist   General bed mobility comments: Verbal cues for technique.  Use of rail.  Assist to raise trunk to sitting, and to move LE's onto bed to return to supine.  Patient with good balance in sitting.  Sat EOB x 12 minutes, performing LE exercises.  Transfers                 General transfer comment: Declined  Ambulation/Gait                 Stairs            Wheelchair Mobility    Modified Rankin (Stroke Patients Only)       Balance Overall balance assessment: Needs assistance Sitting-balance support: No upper extremity supported;Feet supported Sitting balance-Leahy Scale: Good Sitting balance - Comments: Able to perform LE exercises in sitting.                            Cognition Arousal/Alertness: Awake/alert Behavior During Therapy: WFL for tasks assessed/performed Overall Cognitive Status: Within Functional Limits for tasks assessed                      Exercises General Exercises - Lower Extremity Long Arc Quad:  AROM;Both;10 reps;Seated Hip Flexion/Marching: AROM;Both;10 reps;Seated Toe Raises: AROM;Both;10 reps;Seated Heel Raises: AROM;Both;10 reps;Seated    General Comments        Pertinent Vitals/Pain Pain Assessment: 0-10 Pain Score: 3  Pain Location: Generalized Pain Descriptors / Indicators: Aching;Sore Pain Intervention(s): Limited activity within patient's tolerance;Monitored during session;Repositioned    Home Living                      Prior Function            PT Goals (current goals can now be found in the care plan section) Acute Rehab PT Goals Patient Stated Goal: to be able to walk Progress towards PT goals: Progressing toward goals    Frequency    Min 3X/week      PT Plan Current plan remains appropriate    Co-evaluation             End of Session   Activity Tolerance: Patient limited by fatigue Patient left: in bed;with bed alarm set;with call bell/phone within reach     Time: 1134-1157 PT Time Calculation (min) (ACUTE ONLY): 23 min  Charges:  $Therapeutic Activity: 23-37 mins  G Codes:      Despina Pole 12/28/2015, 1:08 PM Carita Pian. Sanjuana Kava, [place] Pager [phone]

## 2015-12-28 NOTE — Clinical Social Work Placement (Signed)
   CLINICAL SOCIAL WORK PLACEMENT  NOTE 12/28/15 - DISCHARGED TO [facility]  Date:  12/28/2015  Patient Details  Name: [name] MRN: [id number] Date of Birth: 06/16/1935  Clinical Social Work is seeking post-discharge placement for this patient at the [place] level of care (*CSW will initial, date and re-position this form in  chart as items are completed):  Yes   Patient/family provided with [place] Work Department's list of facilities offering this level of care within the geographic area requested by the patient (or if unable, by the patient's family).  Yes   Patient/family informed of their freedom to choose among providers that offer the needed level of care, that participate in Medicare, Medicaid or managed care program needed by the patient, have an available bed and are willing to accept the patient.  Yes   Patient/family informed of [facility]'s ownership interest in [facility] and [facility], as well as of the fact that they are under no obligation to receive care at these facilities.  PASRR submitted to EDS on       PASRR number received on       Existing PASRR number confirmed on 12/26/15     FL2 transmitted to all facilities in geographic area requested by pt/family on 12/26/15     FL2 transmitted to all facilities within larger geographic area on       Patient informed that his/her managed care company has contracts with or will negotiate with certain facilities, including the following:         12/28/15 - Patient/family informed of bed offers received.  Patient chooses bed at  [facility].     Physician recommends and patient chooses bed at      Patient to be transferred to  [facility] on  12/28/15.  Patient to be transferred to facility by  ambulance     Patient family notified on  12/28/15 of transfer.  Name of family member notified:   [name] [phone]) - patient's friend.     PHYSICIAN       Additional Comment:    _______________________________________________ Sable Feil, LCSW 12/28/2015, 2:40 PM

## 2015-12-28 NOTE — Care Management Important Message (Signed)
Important Message  Patient Details  Name: [name] MRN: [id number] Date of Birth: [birth year]   Medicare Important Message Given:  Yes    Akeel Reffner [address] 12/28/2015, 3:32 PM

## 2015-12-28 NOTE — Discharge Summary (Signed)
Physician Discharge Summary  [name] [id number] DOB: 1936/01/29 DOA: 12/24/2015  PCP: [name], MD  Admit date: 12/24/2015 Discharge date: 12/28/2015  Admitted From: Home Disposition: SNF  Recommendations for Outpatient Follow-up:  1. Follow up with PCP in 1-2 weeks 2. Please obtain BMP/CBC in one week. 3. Worsening of LVEF needs close follow-up with cardiology as outpatient.  Home Health: NA Equipment/Devices: NA  Discharge Condition: [place] CODE STATUS: FULL Diet recommendation: Heart Healthy  Brief/Interim Summary: [name] is a 80 y.o. male with alcohol abuse, CAD status post CABG, atrial fibrillation, cardiomyopathy was brought to the ER, after the patient was found to be increasingly weak over the last 2 days. Patient denies any chest pain or shortness of breath nausea vomiting diarrhea fever or chills. Patient states he does not take his medications regularly. In the ER troponins were mildly elevated with an EKG showing LBBB and A. fib and UA showing features concerning for UTI. Patient on arrival was hypotensive and was given fluid bolus following which patient's blood pressure improved. Lactate also was elevated. On my exam patient is alert awake and oriented.   Discharge Diagnoses:  Principal Problem:   Generalized weakness Active Problems:   Alcohol abuse   History of digestive system disease   Hyponatremia   Coronary artery disease involving native coronary artery of native heart without angina pectoris   UTI (lower urinary tract infection)   Weakness generalized   Abdominal distention   Leucocytosis   Generalized weakness -Likely multifactorial, UTI, other factors including dehydration and poor oral intake. -This is improving but still weak cutting get out of the bed on his own. -Seen by PT and recommended skilled nursing facility.  UTI -Klebsiella pneumoniae UTI, was on IV Rocephin. -Switched to oral Ceftin, continue for 5 more  days.  Alcohol abuse Patient with no signs of DTs. Continue thiamine, folic acid, multivitamin. Continue the Ativan withdrawal protocol. Follow.  Leukopenia Maybe secondary to UTI. Patient has been pancultured. Follow CBC in 1 week.  Mild hyponatremia/hypomagnesemia/hypokalemia Likely secondary to dehydration and alcohol abuse. Replete potassium to keep around 4. Replete magnesium and keep at 2. This is likely secondary to [facility] due to alcohol. This is all resolved, potassium repleted orally, magnesium repleted parenterally and sodium improved 137 on discharge.  History of atrial fibrillation CHA2DS2VASC >2 Patient currently rate controlled. Patient likely not a candidate for anticoagulation secondary to active alcohol use a risk for falls.  Chronic anemia Follow H&H.  Thrombocytopenia Likely secondary to alcohol abuse.  History of coronary artery disease status post H/cardiomyopathy EF 30-35% per 2-D echo in January of 2016 Repeat echo showed worsening of his ejection fraction to 10-15%.  Per 2-D echo there is no wall motion abnormality, this could be secondary to alcohol. Currently there is no symptoms to suggest decompensation, likely patient not adherent to medications of outpatient. Lisinopril discontinued, metoprolol discontinued and started on Coreg, can increase as much his heart rate allows. Follow-up with primary care physician and cardiology as outpatient.  Lower extremity edema -Left more than right, elevate extremities, given 1 dose of Lasix  Discharge Instructions  Discharge Instructions    Diet - low sodium heart healthy    Complete by:  As directed    Increase activity slowly    Complete by:  As directed        Medication List    STOP taking these medications   metoprolol tartrate 25 MG tablet Commonly known as:  LOPRESSOR  TAKE these medications   aspirin EC 81 MG tablet Take 1 tablet (81 mg total) by mouth daily.   atorvastatin 40 MG  tablet Commonly known as:  LIPITOR TAKE 1 TABLET BY MOUTH EVERY DAY   carvedilol 3.125 MG tablet Commonly known as:  COREG Take 1 tablet (3.125 mg total) by mouth 2 (two) times daily with a meal.   cefUROXime 500 MG tablet Commonly known as:  CEFTIN Take 1 tablet (500 mg total) by mouth 2 (two) times daily with a meal.   ferrous sulfate 325 (65 FE) MG tablet TAKE 1 TABLET BY MOUTH EVERY DAY WITH BREAKFAST   hydrochlorothiazide 25 MG tablet Commonly known as:  HYDRODIURIL Take 25 mg by mouth daily.   lisinopril 5 MG tablet Commonly known as:  PRINIVIL,ZESTRIL TAKE 1 TABLET BY MOUTH DAILY   multivitamin with minerals Tabs tablet Take 1 tablet by mouth daily.   NITROSTAT 0.4 MG SL tablet Generic drug:  nitroGLYCERIN PLACE 1 TABLET UNDER THE TONGUE EVERY 5 MINUTES AS NEEDED FOR CHEST PAIN AS DIRECTED       No Known Allergies  Consultations:  None   Procedures/Studies: Ct Head Wo Contrast  Result Date: 12/24/2015 CLINICAL DATA:  80 year old male with generalized weakness. History of prostate cancer. EXAM: CT HEAD WITHOUT CONTRAST TECHNIQUE: Contiguous axial images were obtained from the base of the skull through the vertex without intravenous contrast. COMPARISON:  Head CT dated 01/15/2015 FINDINGS: Brain: There is moderate age-related atrophy and chronic microvascular ischemic changes. A 15 x 11 mm focal right cerebellar hemisphere old infarct and encephalomalacia. There is no acute intracranial hemorrhage. No mass effect or midline shift noted. No extra-axial fluid collection. Vascular: No hyperdense vessel or unexpected calcification. Skull: Normal. Negative for fracture or focal lesion. Sinuses/Orbits: No acute finding. Other: None IMPRESSION: No acute intracranial hemorrhage. Age-related atrophy and chronic microvascular ischemic disease. If symptoms persist and there are no contraindications, MRI may provide better evaluation if clinically indicated. Electronically Signed    By: Anner Crete M.D.   On: 12/24/2015 23:26   US Abdomen Complete  Result Date: 12/25/2015 CLINICAL DATA:  Patient with history of alcohol abuse. History of pancreatitis. EXAM: ABDOMEN ULTRASOUND COMPLETE COMPARISON:  CT abdomen pelvis 02/21/2013 FINDINGS: Gallbladder: No gallstones or wall thickening visualized. No sonographic Murphy sign noted by sonographer. Common bile duct: Diameter: 5.6 mm Liver: Diffusely increased in echogenicity. No focal lesion identified. IVC: No abnormality visualized. Pancreas: Visualized portion unremarkable. Spleen: Size and appearance within normal limits. Right Kidney: Length: 11.6 cm. Echogenicity within normal limits. No mass or hydronephrosis visualized. Left Kidney: Length: 10.8 cm. Echogenicity within normal limits. No mass or hydronephrosis visualized. Abdominal aorta: No aneurysm visualized. Other findings: Small amount of ascites. Bilateral pleural effusions. IMPRESSION: Diffusely increased hepatic parenchymal echogenicity compatible with steatosis. Electronically Signed   By: Lovey Newcomer M.D.   On: 12/25/2015 09:12   Dg Chest Port 1 View  Result Date: 12/25/2015 CLINICAL DATA:  Leukocytosis. EXAM: PORTABLE CHEST 1 VIEW COMPARISON:  04/06/2014. FINDINGS: Prior CABG. Cardiomegaly with bilateral from interstitial prominence suggesting congestive heart failure. Small bilateral pleural effusions cannot be excluded. No pneumothorax IMPRESSION: 1. Prior CABG. 2. Cardiomegaly with bilateral from interstitial prominence consistent congestive heart failure. Small bilateral pleural effusions cannot be excluded. Electronically Signed   By: Marcello Moores  Register   On: 12/25/2015 07:13   (Echo, Carotid, EGD, Colonoscopy, ERCP)    Subjective:   Discharge Exam: Vitals:   12/28/15 0556 12/28/15 0854  BP: 130/75 116/67  Pulse: 81 100  Resp: 17 17  Temp: 98.1 F (36.7 C) 98.2 F (36.8 C)   Vitals:   12/27/15 1712 12/27/15 2031 12/28/15 0556 12/28/15 0854  BP:  113/75 (!) 118/94 130/75 116/67  Pulse: 69 98 81 100  Resp: 17 18 17 17   Temp: 98.2 F (36.8 C) 98 F (36.7 C) 98.1 F (36.7 C) 98.2 F (36.8 C)  TempSrc: Oral   Oral  SpO2: 98% 97% 94% 99%  Weight:  74 kg (163 lb 2.3 oz)    Height:        General: Pt is alert, awake, not in acute distress Cardiovascular: RRR, S1/S2 +, no rubs, no gallops Respiratory: CTA bilaterally, no wheezing, no rhonchi Abdominal: Soft, NT, ND, bowel sounds + Extremities: no edema, no cyanosis    The results of significant diagnostics from this hospitalization (including imaging, microbiology, ancillary and laboratory) are listed below for reference.     Microbiology: Recent Results (from the past 240 hour(s))  Urine culture     Status: Abnormal   Collection Time: 12/24/15  9:26 PM  Result Value Ref Range Status   Specimen Description URINE, RANDOM  Final   Special Requests NONE  Final   Culture >=100,000 COLONIES/mL KLEBSIELLA PNEUMONIAE (A)  Final   Report Status 12/27/2015 FINAL  Final   Organism ID, Bacteria KLEBSIELLA PNEUMONIAE (A)  Final      Susceptibility   Klebsiella pneumoniae - MIC*    AMPICILLIN >=32 RESISTANT Resistant     CEFAZOLIN <=4 SENSITIVE Sensitive     CEFTRIAXONE <=1 SENSITIVE Sensitive     CIPROFLOXACIN <=0.25 SENSITIVE Sensitive     GENTAMICIN <=1 SENSITIVE Sensitive     IMIPENEM 0.5 SENSITIVE Sensitive     NITROFURANTOIN 32 SENSITIVE Sensitive     TRIMETH/SULFA <=20 SENSITIVE Sensitive     AMPICILLIN/SULBACTAM 4 SENSITIVE Sensitive     PIP/TAZO <=4 SENSITIVE Sensitive     Extended ESBL NEGATIVE Sensitive     * >=100,000 COLONIES/mL KLEBSIELLA PNEUMONIAE  Culture, blood (Routine X 2) w Reflex to ID Panel     Status: None (Preliminary result)   Collection Time: 12/25/15  4:20 AM  Result Value Ref Range Status   Specimen Description BLOOD RIGHT ARM  Final   Special Requests BOTTLES DRAWN AEROBIC AND ANAEROBIC 5CC  Final   Culture NO GROWTH 2 DAYS  Final   Report  Status PENDING  Incomplete  Culture, blood (Routine X 2) w Reflex to ID Panel     Status: None (Preliminary result)   Collection Time: 12/25/15  4:35 AM  Result Value Ref Range Status   Specimen Description BLOOD LEFT ARM  Final   Special Requests IN PEDIATRIC BOTTLE 1CC  Final   Culture NO GROWTH 2 DAYS  Final   Report Status PENDING  Incomplete     Labs: BNP (last 3 results) No results for input(s): BNP in the last 8760 hours. Basic Metabolic Panel:  Recent Labs Lab 12/24/15 2114 12/25/15 0420 12/25/15 1614 12/26/15 1142 12/27/15 0726 12/28/15 0304  NA 128* 132*  --  136 136 137  K 3.0* 3.1* 3.7 3.2* 3.9 3.7  CL 93* 101  --  107 109 110  CO2 22 22  --  22 19* 23  GLUCOSE 86 69  --  85 83 97  BUN 5* 5*  --  8 8 7   CREATININE 0.76 0.59*  --  0.67 0.65 0.72  CALCIUM 7.5* 7.3*  --  7.1* 7.3* 7.5*  MG 1.3* 1.8  --  1.5* 2.0  --    Liver Function Tests:  Recent Labs Lab 12/24/15 2114 12/25/15 0420 12/26/15 1142  AST 86* 85* 70*  ALT 45 47 39  ALKPHOS 87 92 84  BILITOT 1.1 1.0 1.2  PROT 5.1* 5.3* 4.5*  ALBUMIN 1.9* 2.0* 1.7*   No results for input(s): LIPASE, AMYLASE in the last 168 hours.  Recent Labs Lab 12/25/15 0947  AMMONIA 31   CBC:  Recent Labs Lab 12/24/15 2114 12/25/15 0420 12/26/15 1142 12/27/15 0726  WBC 3.1* 1.9* 2.2* 2.6*  NEUTROABS 1.6* 1.0* 1.3*  --   HGB 10.5* 11.1* 10.0* 10.5*  HCT 30.9* 31.6* 30.4* 32.1*  MCV 104.0* 104.6* 106.7* 108.4*  PLT 86* 72* 72* 79*   Cardiac Enzymes:  Recent Labs Lab 12/24/15 2114 12/25/15 0420 12/25/15 0947 12/25/15 1614  TROPONINI 0.13* 0.13* 0.10* 0.06*   BNP: Invalid input(s): POCBNP CBG: No results for input(s): GLUCAP in the last 168 hours. D-Dimer No results for input(s): DDIMER in the last 72 hours. Hgb A1c No results for input(s): HGBA1C in the last 72 hours. Lipid Profile No results for input(s): CHOL, HDL, LDLCALC, TRIG, CHOLHDL, LDLDIRECT in the last 72 hours. Thyroid function  studies No results for input(s): TSH, T4TOTAL, T3FREE, THYROIDAB in the last 72 hours.  Invalid input(s): FREET3 Anemia work up No results for input(s): VITAMINB12, FOLATE, FERRITIN, TIBC, IRON, RETICCTPCT in the last 72 hours. Urinalysis    Component Value Date/Time   COLORURINE YELLOW 12/24/2015 2126   APPEARANCEUR CLEAR 12/24/2015 2126   LABSPEC <1.005 (L) 12/24/2015 2126   PHURINE 6.0 12/24/2015 2126   GLUCOSEU NEGATIVE 12/24/2015 2126   HGBUR TRACE (A) 12/24/2015 2126   BILIRUBINUR NEGATIVE 12/24/2015 2126   KETONESUR NEGATIVE 12/24/2015 2126   PROTEINUR NEGATIVE 12/24/2015 2126   UROBILINOGEN 1.0 02/21/2013 0549   NITRITE NEGATIVE 12/24/2015 2126   LEUKOCYTESUR LARGE (A) 12/24/2015 2126   Sepsis Labs Invalid input(s): PROCALCITONIN,  WBC,  LACTICIDVEN Microbiology Recent Results (from the past 240 hour(s))  Urine culture     Status: Abnormal   Collection Time: 12/24/15  9:26 PM  Result Value Ref Range Status   Specimen Description URINE, RANDOM  Final   Special Requests NONE  Final   Culture >=100,000 COLONIES/mL KLEBSIELLA PNEUMONIAE (A)  Final   Report Status 12/27/2015 FINAL  Final   Organism ID, Bacteria KLEBSIELLA PNEUMONIAE (A)  Final      Susceptibility   Klebsiella pneumoniae - MIC*    AMPICILLIN >=32 RESISTANT Resistant     CEFAZOLIN <=4 SENSITIVE Sensitive     CEFTRIAXONE <=1 SENSITIVE Sensitive     CIPROFLOXACIN <=0.25 SENSITIVE Sensitive     GENTAMICIN <=1 SENSITIVE Sensitive     IMIPENEM 0.5 SENSITIVE Sensitive     NITROFURANTOIN 32 SENSITIVE Sensitive     TRIMETH/SULFA <=20 SENSITIVE Sensitive     AMPICILLIN/SULBACTAM 4 SENSITIVE Sensitive     PIP/TAZO <=4 SENSITIVE Sensitive     Extended ESBL NEGATIVE Sensitive     * >=100,000 COLONIES/mL KLEBSIELLA PNEUMONIAE  Culture, blood (Routine X 2) w Reflex to ID Panel     Status: None (Preliminary result)   Collection Time: 12/25/15  4:20 AM  Result Value Ref Range Status   Specimen Description BLOOD  RIGHT ARM  Final   Special Requests BOTTLES DRAWN AEROBIC AND ANAEROBIC 5CC  Final   Culture NO GROWTH 2 DAYS  Final   Report Status PENDING  Incomplete  Culture, blood (Routine X  2) w Reflex to ID Panel     Status: None (Preliminary result)   Collection Time: 12/25/15  4:35 AM  Result Value Ref Range Status   Specimen Description BLOOD LEFT ARM  Final   Special Requests IN PEDIATRIC BOTTLE 1CC  Final   Culture NO GROWTH 2 DAYS  Final   Report Status PENDING  Incomplete     Time coordinating discharge: Over 30 minutes  SIGNED:   Birdie Hopes, MD  [facility] 12/28/2015, 10:07 AM Pager   If 7PM-7AM, please contact night-coverage [email or web address] Password [email or web address]

## 2015-12-29 ENCOUNTER — Non-Acute Institutional Stay (SKILLED_NURSING_FACILITY): Payer: Medicare Other | Admitting: Nurse Practitioner

## 2015-12-29 ENCOUNTER — Encounter: Payer: Self-pay | Admitting: Nurse Practitioner

## 2015-12-29 DIAGNOSIS — N390 Urinary tract infection, site not specified: Secondary | ICD-10-CM

## 2015-12-29 DIAGNOSIS — R531 Weakness: Secondary | ICD-10-CM | POA: Diagnosis not present

## 2015-12-29 DIAGNOSIS — I5022 Chronic systolic (congestive) heart failure: Secondary | ICD-10-CM

## 2015-12-29 DIAGNOSIS — D638 Anemia in other chronic diseases classified elsewhere: Secondary | ICD-10-CM | POA: Diagnosis not present

## 2015-12-29 DIAGNOSIS — F1010 Alcohol abuse, uncomplicated: Secondary | ICD-10-CM | POA: Diagnosis not present
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
## 2015-12-30 LAB — CULTURE, BLOOD (ROUTINE X 2)
CULTURE: NO GROWTH
Culture: NO GROWTH

## 2016-01-02 ENCOUNTER — Non-Acute Institutional Stay (SKILLED_NURSING_FACILITY): Payer: Medicare Other | Admitting: Internal Medicine

## 2016-01-02 ENCOUNTER — Encounter: Payer: Self-pay | Admitting: Internal Medicine

## 2016-01-02 DIAGNOSIS — I429 Cardiomyopathy, unspecified: Secondary | ICD-10-CM | POA: Insufficient documentation

## 2016-01-02 DIAGNOSIS — I10 Essential (primary) hypertension: Secondary | ICD-10-CM | POA: Diagnosis not present

## 2016-01-02 DIAGNOSIS — F1010 Alcohol abuse, uncomplicated: Secondary | ICD-10-CM

## 2016-01-02 DIAGNOSIS — R531 Weakness: Secondary | ICD-10-CM | POA: Diagnosis not present

## 2016-01-02 DIAGNOSIS — I482 Chronic atrial fibrillation, unspecified: Secondary | ICD-10-CM

## 2016-01-02 HISTORY — DX: Cardiomyopathy, unspecified: I42.9

## 2016-01-02 NOTE — Assessment & Plan Note (Signed)
Rate control is adequate

## 2016-01-02 NOTE — Assessment & Plan Note (Signed)
Nutrition consult Trial of Cymbalta

## 2016-01-02 NOTE — Assessment & Plan Note (Addendum)
  D/C Ceftin 01/03/16

## 2016-01-02 NOTE — Patient Instructions (Signed)
New orders : 01/05/16 CBC and differential, magnesium and BMET Increase carvedilol and monitor blood pressure Cymbalta to treat depression and chronic pain

## 2016-01-02 NOTE — Assessment & Plan Note (Signed)
Risk of DTs has past ;Cymbalta will be initiated to address depression as well as the chronic pain phenomena

## 2016-01-02 NOTE — Assessment & Plan Note (Signed)
IncreaseCarvedilol dose

## 2016-01-02 NOTE — Progress Notes (Signed)
Facility Location: [facility]  Room Number: 116-A  Code Status: Full Code   PCP: [name], MD [place] [id number]   This is a comprehensive admission note to [facility] performed on this date less than 30 days from date of admission. Included are preadmission medical/surgical history;reconciled medication list; family history; social history and comprehensive review of systems.  Corrections and additions to the records were documented . Comprehensive physical exam was also performed. Additionally a clinical summary was entered for each active diagnosis pertinent to this admission in the Problem List to enhance continuity of care.  HPI: The patient was hospitalized 9/24-9/28/17 for progressive weakness over 48 hours. The patient admitted to being noncompliant with his medications. In the emergency room troponins and lactate were mildly elevated, EKG revealed left bundle branch block and A fib Hypotension responded to IV fluids. He was found to have a Klebsiella pneumonia UTI for which he received IV Rocephin for transition to oral Ceftin to be continued until 10/3. He was found to have leukopenia, follow-up CBC was recommended 10/7. He had mild hyponatremia and hypomagnesemia and hypokalemia attributed to dehydration and alcohol abuse. His CHADs score was over 2. Rate was felt to be clinically well controlled. He was not a candidate for anticoagulation due to active alcohol use with risk for falls in context of thrombocytopenia and chronic anemia. Hemoglobin 10.5 and hematocrit 32.1 and platelet count 79,000 on 9/27. He has hypocalcemia with values ranging from 7.1-7.5. AST was mildly elevated at 70  Past medical and surgical history: As noted is positive for alcohol abuse. He has history of coronary artery disease and cardiomyopathy with an ejection fraction of 10-15% down from 30-35 percent as per echo in January 2016.  Left ventricle mildly dilated, severe reduction in systolic function, paradoxic movement of the ventricular septum, mild mitral valve regurgitation, mild dilation left atrium, mild regurgitation tricuspid valve He's had bypass grafting twice in 1985 and 1997. Cardiac cath in 2009 revealed severe three-vessel disease .He's had seed implant for prostate cancer in 2011. His alcoholism has been complicated by pancreatitis .He also has dyslipidemia.  Social history: Quit smoking 1990. He states he drinks a bottle of wine daily  Family history: Reviewed, noncontributory  Review of systems: He describes ongoing weakness and poor appetite. He has edema of lower extremities. He admits to depression due to family health issues. Despite the extremely poor ejection fraction he denies any significant cardiopulmonary symptoms Constitutional: No fever.  Eyes: No redness, discharge, pain, vision change ENT/mouth: No nasal congestion,  purulent discharge, earache,change in hearing ,sore throat  Cardiovascular: No chest pain, palpitations,paroxysmal nocturnal dyspnea, claudication, edema  Respiratory: No cough, sputum production,hemoptysis, DOE , significant snoring,apnea   Gastrointestinal: No heartburn,dysphagia,abdominal pain, nausea / vomiting,rectal bleeding, melena,change in bowels Genitourinary: No dysuria,hematuria, pyuria,  incontinence, nocturia Musculoskeletal: No joint stiffness, joint swelling, weakness,pain Dermatologic: No rash, pruritus, change in appearance of skin Neurologic: No dizziness,headache,syncope, seizures, numbness , tingling Endocrine: No change in hair/skin/ nails, excessive thirst, excessive hunger, excessive urination  Hematologic/lymphatic: No lymphadenopathy,abnormal bleeding Allergy/immunology: No itchy/ watery eyes, significant sneezing, urticaria, angioedema  Physical exam:  Pertinent or positive findings: He was oriented to the date but was unable to name the president. He  did remember the last president was Obama. Affect is markedly flat. Pattern alopecia is present. He has a beard and mustache. There is a curvilinear operative scar over the left lateral crown. He is unable to  tell me how this occurred. He is completely edentulous but is only wearing the lower plate. Nasal septum dislocated to L. Heart sounds are markedly distant and irregular. He has faint rales diffusely without increased work of breathing. He has 1+ pedal edema. Pulses are decreased. He is wearing Pampers.  He has wasting of the temples and also interosseous wasting. He is diffusely weak.  Slight clubbing of the nailbeds is suggested. He has irregular bruising over the dorsum of the hands.  General appearance:no acute distress Lymphatic: No lymphadenopathy about the head, neck, axilla . Eyes: No conjunctival inflammation or lid edema is present. There is no scleral icterus. Ears:  External ear exam shows no significant lesions or deformities.   Nose:  External nasal examination shows no deformity or inflammation. Nasal mucosa are pink and moist without lesions ,exudates Oral exam: lips and gums are healthy appearing.There is no oropharyngeal erythema or exudate . Neck:  No thyromegaly, masses, tenderness noted.    Heart:  No gallop, murmur, click, rub .  Abdomen:Bowel sounds are normal. Abdomen is soft and nontender with no organomegaly, hernias,masses. GU: deferred . Extremities:  No cyanosis, clubbing,edema  Neurologic exam : Strength equal  in upper & lower extremities but decreased Balance,Rhomberg,finger to nose testing could not be completed due to clinical state Deep tendon reflexes are equal Skin: Warm & dry w/o tenting. No significant lesions or rash.  See clinical summary under each active problem in the Problem List with associated updated therapeutic plan

## 2016-01-02 NOTE — Assessment & Plan Note (Addendum)
01/02/16 Blood pressure 208/148 Carvedilol will be increased to 6.25 mg bid  & BP monitored

## 2016-01-06 LAB — CBC AND DIFFERENTIAL
HEMATOCRIT: 28 % — AB (ref 41–53)
HEMOGLOBIN: 9.6 g/dL — AB (ref 13.5–17.5)
Neutrophils Absolute: 1508 /uL
Platelets: 128 10*3/uL — AB (ref 150–399)
WBC: 2.9 10^3/mL

## 2016-01-06 LAB — BASIC METABOLIC PANEL
BUN: 10 mg/dL (ref 4–21)
Creatinine: 0.5 mg/dL — AB (ref 0.6–1.3)
Glucose: 100 mg/dL
POTASSIUM: 3.1 mmol/L — AB (ref 3.4–5.3)
SODIUM: 136 mmol/L — AB (ref 137–147)

## 2016-01-09 ENCOUNTER — Encounter: Payer: Self-pay | Admitting: Internal Medicine

## 2016-01-09 ENCOUNTER — Non-Acute Institutional Stay (SKILLED_NURSING_FACILITY): Payer: Medicare Other | Admitting: Internal Medicine

## 2016-01-09 DIAGNOSIS — D638 Anemia in other chronic diseases classified elsewhere: Secondary | ICD-10-CM

## 2016-01-09 DIAGNOSIS — E8809 Other disorders of plasma-protein metabolism, not elsewhere classified: Secondary | ICD-10-CM

## 2016-01-09 DIAGNOSIS — R531 Weakness: Secondary | ICD-10-CM

## 2016-01-09 HISTORY — DX: Other disorders of plasma-protein metabolism, not elsewhere classified: E88.09

## 2016-01-09 HISTORY — DX: Hypocalcemia: E83.51

## 2016-01-09 NOTE — Assessment & Plan Note (Signed)
Replete magnesium and monitor for diarrhea

## 2016-01-09 NOTE — Assessment & Plan Note (Signed)
Hypoalbuminemia is component of  significant edema  12/26/15 albumin 1.7 (normal  is greater than 3.5) Supplementation will be pursued

## 2016-01-09 NOTE — Patient Instructions (Signed)
Anemia will be evaluated with ferritin, B12, and folate levels. The hypomagnesemia, hypoalbuminemia, and hypocalcemia will be addressed as per orders.

## 2016-01-09 NOTE — Assessment & Plan Note (Signed)
Calcium supplementation with vitamin D will be instituted

## 2016-01-09 NOTE — Assessment & Plan Note (Signed)
Multifactorial related to the anemia, abnormal chemistries and deconditioning PT/OT

## 2016-01-09 NOTE — Progress Notes (Signed)
   This is a nursing facility follow up for specific acute issues of progressive macrocytic anemia and thrombocytopenia, hypocalcemia and hypomagnesemia.  Interim medical record and care since last [place] visit was updated with review of diagnostic studies and change in clinical status since last visit were documented.  HPI: On 9/27 hemoglobin was 10.5 and hematocrit 32.1. 10/6 hemoglobin was 9.6 and hematocrit 28.2. Macrocytosis was present with an MCV of 103.3, Thrombocytopenia had improved from 79,000 up to 128,000. Calcium has dropped from 7.5 down to 7.3 and magnesium from 2.0 to 1.2.  Review of systems:  Epistaxis, hemoptysis, hematuria, melena, or rectal bleeding denied. No unexplained weight loss, significant dyspepsia,dysphagia, or abdominal pain.  There is no abnormal bruising , bleeding, or difficulty stopping bleeding with injury; but easy bruising has been a chronic issue. His chief complaint is feeling weak, he denies any pain. He describes his stools as occasionally dark but denies frank melena. There is no diarrhea or  constipation. He's never had a colonoscopy.  Physical exam:  Pertinent or positive findings: He has wasting of the temples as well as the interosseous areas.He has complete dentures. Heart sounds are distant and slightly irregular. He has a barrel chest with flaring of the inferior rib cages. Minor rales are present at the bases without increased work of breathing. He has pitting at the elastic band of his pants and socks, more so on the right than the left. Pedal pulses are decreased. He has scattered bruising over the hands.  General appearance:no acute distress , increased work of breathing is present.   Lymphatic: No lymphadenopathy about the head, neck, axilla . Eyes: No conjunctival inflammation or lid edema is present. There is no scleral icterus. Ears:  External ear exam shows no significant lesions or deformities.   Nose:  External  nasal examination shows no deformity or inflammation. Nasal mucosa are pink and moist without lesions ,exudates Oral exam: lips and gums are healthy appearing.There is no oropharyngeal erythema or exudate . Neck:  No thyromegaly, masses, tenderness noted.    Heart:  No  gallop, murmur, click, rub .  Abdomen:Bowel sounds are normal. Abdomen is soft and nontender with no organomegaly, hernias,masses. GU: deferred  Extremities:  No cyanosis Neurologic exam : Strength equal  in upper & lower extremities but generally decreased Balance,Rhomberg,finger to nose testing could not be completed due to clinical state Deep tendon reflexes are equal Skin: Warm & dry w/o tenting. No significant lesions or rash.    See summary under each active problem in the Problem List with associated updated therapeutic plan

## 2016-01-09 NOTE — Assessment & Plan Note (Addendum)
01/09/16 macrocytic anemia present with slight progression. This is in the context of alcohol abuse. Macrocytosis raises the possibility of folate deficiency or B12 deficiency. Last B12 on record was 512 on 07/30/13. Ferritin was 16 on 05/31/13. B12, folate, and ferritin will be rechecked

## 2016-01-11 ENCOUNTER — Encounter: Payer: Self-pay | Admitting: Internal Medicine

## 2016-01-19 ENCOUNTER — Ambulatory Visit (INDEPENDENT_AMBULATORY_CARE_PROVIDER_SITE_OTHER): Payer: Medicare Other | Admitting: Physician Assistant

## 2016-01-19 ENCOUNTER — Encounter: Payer: Self-pay | Admitting: Physician Assistant

## 2016-01-19 VITALS — BP 98/60 | HR 66 | Ht 71.0 in | Wt 160.0 lb

## 2016-01-19 DIAGNOSIS — Z79899 Other long term (current) drug therapy: Secondary | ICD-10-CM | POA: Diagnosis not present

## 2016-01-19 DIAGNOSIS — I2510 Atherosclerotic heart disease of native coronary artery without angina pectoris: Secondary | ICD-10-CM

## 2016-01-19 DIAGNOSIS — I5043 Acute on chronic combined systolic (congestive) and diastolic (congestive) heart failure: Secondary | ICD-10-CM | POA: Diagnosis not present

## 2016-01-19 MED ORDER — FUROSEMIDE 20 MG PO TABS: 20.0000 mg | ORAL_TABLET | Freq: Every day | ORAL | 3 refills | Status: DC

## 2016-01-19 MED ORDER — LISINOPRIL 5 MG PO TABS: 5.0000 mg | ORAL_TABLET | Freq: Every day | ORAL | 3 refills | Status: AC

## 2016-01-19 NOTE — Patient Instructions (Addendum)
Weigh daily. Call [phone] if weight climbs more than 3 pounds in a day or 5 pounds in a week. No salt to very little salt in your diet.  No more than 2000 mg in a day. Call if increased shortness of breath or increased swelling.  START Furosemide 20 mg - take 1 tablet by mouth once daily CHANGE THE WAY YOU TAKE Lisinopril 5 mg  AT BEDTIME  Your physician recommends that you return for lab work in 1 week.  Rhonda Barrett, PA-C, recommends that you schedule a follow-up appointment in 1 month.    Low-Sodium Eating Plan Sodium raises blood pressure and causes water to be held in the body. Getting less sodium from food will help lower your blood pressure, reduce any swelling, and protect your heart, liver, and kidneys. We get sodium by adding salt (sodium chloride) to food. Most of our sodium comes from canned, boxed, and frozen foods. Restaurant foods, fast foods, and pizza are also very high in sodium. Even if you take medicine to lower your blood pressure or to reduce fluid in your body, getting less sodium from your food is important. WHAT IS MY PLAN? Most people should limit their sodium intake to 2,300 mg a day. Your health care provider recommends that you limit your sodium intake to __________ a day.  WHAT DO I NEED TO KNOW ABOUT THIS EATING PLAN? For the low-sodium eating plan, you will follow these general guidelines:  Choose foods with a % Daily Value for sodium of less than 5% (as listed on the food label).   Use salt-free seasonings or herbs instead of table salt or sea salt.   Check with your health care provider or pharmacist before using salt substitutes.   Eat fresh foods.  Eat more vegetables and fruits.  Limit canned vegetables. If you do use them, rinse them well to decrease the sodium.   Limit cheese to 1 oz (28 g) per day.   Eat lower-sodium products, often labeled as "lower sodium" or "no salt added."  Avoid foods that contain monosodium glutamate (MSG).  MSG is sometimes added to [place] food and some canned foods.  Check food labels (Nutrition Facts labels) on foods to learn how much sodium is in one serving.  Eat more home-cooked food and less restaurant, buffet, and fast food.  When eating at a restaurant, ask that your food be prepared with less salt, or no salt if possible.  HOW DO I READ FOOD LABELS FOR SODIUM INFORMATION? The Nutrition Facts label lists the amount of sodium in one serving of the food. If you eat more than one serving, you must multiply the listed amount of sodium by the number of servings. Food labels may also identify foods as:  Sodium free--Less than 5 mg in a serving.  Very low sodium--35 mg or less in a serving.  Low sodium--140 mg or less in a serving.  Light in sodium--50% less sodium in a serving. For example, if a food that usually has 300 mg of sodium is changed to become light in sodium, it will have 150 mg of sodium.  Reduced sodium--25% less sodium in a serving. For example, if a food that usually has 400 mg of sodium is changed to reduced sodium, it will have 300 mg of sodium. WHAT FOODS CAN I EAT? Grains Low-sodium cereals, including oats, puffed wheat and rice, and shredded wheat cereals. Low-sodium crackers. Unsalted rice and pasta. Lower-sodium bread.  Vegetables Frozen or fresh vegetables. Low-sodium or  reduced-sodium canned vegetables. Low-sodium or reduced-sodium tomato sauce and paste. Low-sodium or reduced-sodium tomato and vegetable juices.  Fruits Fresh, frozen, and canned fruit. Fruit juice.  Meat and Other Protein Products Low-sodium canned tuna and salmon. Fresh or frozen meat, poultry, seafood, and fish. Lamb. Unsalted nuts. Dried beans, peas, and lentils without added salt. Unsalted canned beans. Homemade soups without salt. Eggs.  Dairy Milk. Soy milk. Ricotta cheese. Low-sodium or reduced-sodium cheeses. Yogurt.  Condiments Fresh and dried herbs and spices. Salt-free  seasonings. Onion and garlic powders. Low-sodium varieties of mustard and ketchup. Fresh or refrigerated horseradish. Lemon juice.  Fats and Oils Reduced-sodium salad dressings. Unsalted butter.  Other Unsalted popcorn and pretzels.  The items listed above may not be a complete list of recommended foods or beverages. Contact your dietitian for more options. WHAT FOODS ARE NOT RECOMMENDED? Grains Instant hot cereals. Bread stuffing, pancake, and biscuit mixes. Croutons. Seasoned rice or pasta mixes. Noodle soup cups. Boxed or frozen macaroni and cheese. Self-rising flour. Regular salted crackers. Vegetables Regular canned vegetables. Regular canned tomato sauce and paste. Regular tomato and vegetable juices. Frozen vegetables in sauces. Salted [place] fries. Olives. Angie Fava. Relishes. Sauerkraut. Salsa. Meat and Other Protein Products Salted, canned, smoked, spiced, or pickled meats, seafood, or fish. Bacon, ham, sausage, hot dogs, corned beef, chipped beef, and packaged luncheon meats. Salt pork. Jerky. Pickled herring. Anchovies, regular canned tuna, and sardines. Salted nuts. Dairy Processed cheese and cheese spreads. Cheese curds. Blue cheese and cottage cheese. Buttermilk.  Condiments Onion and garlic salt, seasoned salt, table salt, and sea salt. Canned and packaged gravies. Worcestershire sauce. Tartar sauce. Barbecue sauce. Teriyaki sauce. Soy sauce, including reduced sodium. Steak sauce. Fish sauce. Oyster sauce. Cocktail sauce. Horseradish that you find on the shelf. Regular ketchup and mustard. Meat flavorings and tenderizers. Bouillon cubes. Hot sauce. Tabasco sauce. Marinades. Taco seasonings. Relishes. Fats and Oils Regular salad dressings. Salted butter. Margarine. Ghee. Bacon fat.  Other Potato and tortilla chips. Corn chips and puffs. Salted popcorn and pretzels. Canned or dried soups. Pizza. Frozen entrees and pot pies.  The items listed above may not be a complete  list of foods and beverages to avoid. Contact your dietitian for more information.   This information is not intended to replace advice given to you by your health care provider. Make sure you discuss any questions you have with your health care provider.   Document Released: 09/07/2001 Document Revised: 04/08/2014 Document Reviewed: 01/20/2013 Elsevier Interactive Patient Education [employer].
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
## 2016-01-25 ENCOUNTER — Encounter: Payer: Self-pay | Admitting: Internal Medicine

## 2016-01-25 ENCOUNTER — Non-Acute Institutional Stay (SKILLED_NURSING_FACILITY): Payer: Medicare Other | Admitting: Internal Medicine

## 2016-01-25 DIAGNOSIS — R059 Cough, unspecified: Secondary | ICD-10-CM

## 2016-01-25 DIAGNOSIS — R05 Cough: Secondary | ICD-10-CM

## 2016-01-25 DIAGNOSIS — R938 Abnormal findings on diagnostic imaging of other specified body structures: Secondary | ICD-10-CM

## 2016-01-25 DIAGNOSIS — I5022 Chronic systolic (congestive) heart failure: Secondary | ICD-10-CM | POA: Diagnosis not present

## 2016-01-25 DIAGNOSIS — R9389 Abnormal findings on diagnostic imaging of other specified body structures: Secondary | ICD-10-CM

## 2016-01-25 NOTE — Progress Notes (Signed)
This is a nursing facility follow up for specific acute issue of cough and abnormal CXray.  Interim medical record and care since last [place] visit was updated with review of diagnostic studies and change in clinical status since last visit were documented.  HPI: The patient has been noted to have a loose rattly cough.  Mobile Chest x-ray 01/24/16 suggested congestive heart failure with asymmetric infiltrates and effusion. The x-rays were personally reviewed. He received 40 mg of Lasix  & was given potassium 20 mEq 1. Chemistries and CBC were ordered by the on-call care provider. Surprisingly he is essentially asymptomatic from a cardiopulmonary standpoint. He describes a hacking cough but denies any specific upper or lower respiratory tract symptoms otherwise. He also denies any extrinsic symptoms. His main complaints are poor balance and decreased energy. He was seen 01/19/16 by cardiology, he was felt to have acute on chronic systolic congestive heart failure. Concern was expressed about volume increase despite decrease in weight. The patient is on spironolactone 25 mg daily. Lasix 20 mg daily was initiated with low sodium diet.BMET was planned in one week to assess need for potassium supplementation. Lisinopril was changed to bedtime to prevent significant hypotension during the day. His systolic blood pressure in the 90s was acceptable if he is asymptomatic.  Review of systems: Constitutional: No fever,significant weight change, fatigue  Eyes: No redness, discharge, pain, vision change ENT/mouth: No nasal congestion,  purulent discharge, earache,change in hearing ,sore throat  Cardiovascular: No chest pain, palpitations,paroxysmal nocturnal dyspnea, claudication, edema  Respiratory: No sputum production,hemoptysis, DOE ("but I haven't done much") , significant snoring,apnea  Gastrointestinal: No heartburn,dysphagia,abdominal pain, nausea / vomiting,rectal bleeding,  melena,change in bowels Genitourinary: No dysuria,hematuria, pyuria,  incontinence, nocturia Musculoskeletal: No joint stiffness, joint swelling, weakness,pain Dermatologic: No rash, pruritus, change in appearance of skin Neurologic: No dizziness,headache,syncope, seizures, numbness , tingling Psychiatric: No significant anxiety , depression, insomnia, anorexia Endocrine: No change in hair/skin/ nails, excessive thirst, excessive hunger, excessive urination  Hematologic/lymphatic: No  lymphadenopathy,abnormal bleeding Allergy/immunology: No itchy/ watery eyes, significant sneezing, urticaria, angioedema  Physical exam:  Pertinent or positive findings: Initially he was lying flat in the bed without any respiratory compromise. He does exhibit an intermittent minor rattly cough as he sat up. He has pattern alopecia. He has a mustache. He is Geneticist, molecular. He has complete dentures. Heart rhythm and rate are slightly irregular and heart sounds are distant. Heart sounds are heard best in the epigastrium. In supine position he had bilateral rublike rhonchi in the anterior chest inferiorly. When he sat up breath sounds were markedly decreased with out significant abnormal findings otherwise.  He has atrophy of the arms. He has scattered bruising over the forearms. Pedal pulses are decreased. Reflexes are 0-1/2+ but equal.  General appearance:Thin but adequately nourished; no acute distress , increased work of breathing is present.   Lymphatic: No lymphadenopathy about the head, neck, axilla Eyes: No conjunctival inflammation or lid edema is present. There is no scleral icterus. Ears:  External ear exam shows no significant lesions or deformities.   Nose:  External nasal examination shows no deformity or inflammation. Nasal mucosa are pink and moist without lesions ,exudates Oral exam: lips and gums are healthy appearing.There is no oropharyngeal erythema or exudate . Neck:  No thyromegaly, masses,  tenderness noted.    Heart:  No gallop, murmur, click, rub .  Abdomen:Bowel sounds are normal. Abdomen is soft and nontender with no organomegaly, hernias,masses. GU: deferred  Extremities:  No  cyanosis, clubbing,edema  Neurologic exam : Strength equal  in upper & lower extremities but decreased. Balance,Rhomberg,finger to nose testing could not be completed due to clinical state Skin: Warm & dry w/o tenting. No significant lesions or rash.  #1 cough #2 abnormal breath sounds #3 abnormal chest x-ray Note: Patient is surprisingly asymptomatic and has good O2 sats on room air despite above #4 acute on chronic systolic congestive heart failure

## 2016-01-25 NOTE — Patient Instructions (Addendum)
New orders : BNP CBC and CMP pending. Strict sodium restriction will be emphasized. Lasix will be increased to 40 mg daily. No change in spironolactone pending return of lab results. Cardiology will receive copy of today's evaluation. Despite the cough, no acute infectious process suggested

## 2016-01-27 LAB — HEPATIC FUNCTION PANEL
ALK PHOS: 46 U/L (ref 25–125)
ALT: 16 U/L (ref 10–40)
AST: 25 U/L (ref 14–40)
BILIRUBIN, TOTAL: 1 mg/dL

## 2016-01-27 LAB — BASIC METABOLIC PANEL
BUN: 14 mg/dL (ref 4–21)
CREATININE: 0.6 mg/dL (ref 0.6–1.3)
Glucose: 97 mg/dL
Potassium: 2.7 mmol/L — AB (ref 3.4–5.3)
Sodium: 139 mmol/L (ref 137–147)

## 2016-01-27 LAB — CBC AND DIFFERENTIAL
HEMATOCRIT: 29 % — AB (ref 41–53)
Hemoglobin: 9.5 g/dL — AB (ref 13.5–17.5)
Platelets: 170 10*3/uL (ref 150–399)
WBC: 3.5 10*3/mL

## 2016-01-27 LAB — POCT B-TYPE NATRIURETIC PEPTIDE (BNP): Pro B Natriuretic peptide (BNP): 518.7

## 2016-01-30 LAB — BASIC METABOLIC PANEL
BUN: 21 mg/dL (ref 4–21)
CREATININE: 0.8 mg/dL (ref 0.6–1.3)
Glucose: 127 mg/dL
POTASSIUM: 3.9 mmol/L (ref 3.4–5.3)
SODIUM: 144 mmol/L (ref 137–147)

## 2016-02-21 ENCOUNTER — Non-Acute Institutional Stay (SKILLED_NURSING_FACILITY): Payer: Medicare Other | Admitting: Nurse Practitioner

## 2016-02-21 DIAGNOSIS — F329 Major depressive disorder, single episode, unspecified: Secondary | ICD-10-CM | POA: Diagnosis not present

## 2016-02-21 DIAGNOSIS — I10 Essential (primary) hypertension: Secondary | ICD-10-CM | POA: Diagnosis not present

## 2016-02-21 DIAGNOSIS — D638 Anemia in other chronic diseases classified elsewhere: Secondary | ICD-10-CM

## 2016-02-21 DIAGNOSIS — F32A Depression, unspecified: Secondary | ICD-10-CM

## 2016-02-21 DIAGNOSIS — I5022 Chronic systolic (congestive) heart failure: Secondary | ICD-10-CM

## 2016-02-21 DIAGNOSIS — E781 Pure hyperglyceridemia: Secondary | ICD-10-CM | POA: Diagnosis not present
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
## 2016-02-27 LAB — LIPID PANEL
Cholesterol: 128 mg/dL (ref 0–200)
HDL: 43 mg/dL (ref 35–70)
LDL Cholesterol: 64 mg/dL
TRIGLYCERIDES: 108 mg/dL (ref 40–160)

## 2016-02-27 LAB — BASIC METABOLIC PANEL
BUN: 26 mg/dL — AB (ref 4–21)
CREATININE: 0.9 mg/dL (ref 0.6–1.3)
GLUCOSE: 97 mg/dL
Potassium: 3.6 mmol/L (ref 3.4–5.3)
SODIUM: 141 mmol/L (ref 137–147)

## 2016-02-29 ENCOUNTER — Encounter: Payer: Self-pay | Admitting: *Deleted

## 2016-02-29 ENCOUNTER — Encounter: Payer: Self-pay | Admitting: Physician Assistant

## 2016-02-29 ENCOUNTER — Ambulatory Visit (INDEPENDENT_AMBULATORY_CARE_PROVIDER_SITE_OTHER): Payer: Medicare Other | Admitting: Physician Assistant

## 2016-02-29 VITALS — BP 98/50 | HR 62 | Ht 71.0 in | Wt 160.5 lb

## 2016-02-29 DIAGNOSIS — I25810 Atherosclerosis of coronary artery bypass graft(s) without angina pectoris: Secondary | ICD-10-CM | POA: Diagnosis not present

## 2016-02-29 DIAGNOSIS — I5023 Acute on chronic systolic (congestive) heart failure: Secondary | ICD-10-CM | POA: Diagnosis not present
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
## 2016-02-29 NOTE — Patient Instructions (Signed)
Rosaria Ferries, PA-C recommends that you schedule a follow-up appointment in JANUARY 2018 with Dr Sallyanne Kuster.  If you need a refill on your cardiac medications before your next appointment, please call your pharmacy.

## 2016-02-29 NOTE — Progress Notes (Signed)
Thanks. Does that mean he is switching providers? Mckaylah Bettendorf

## 2016-03-06 ENCOUNTER — Encounter: Payer: Self-pay | Admitting: Family Medicine

## 2016-03-06 ENCOUNTER — Non-Acute Institutional Stay: Payer: Medicare Other | Admitting: Family Medicine

## 2016-03-06 DIAGNOSIS — I639 Cerebral infarction, unspecified: Secondary | ICD-10-CM

## 2016-03-06 DIAGNOSIS — M810 Age-related osteoporosis without current pathological fracture: Secondary | ICD-10-CM

## 2016-03-06 DIAGNOSIS — M4850XD Collapsed vertebra, not elsewhere classified, site unspecified, subsequent encounter for fracture with routine healing: Secondary | ICD-10-CM | POA: Diagnosis not present

## 2016-03-06 DIAGNOSIS — Z593 Problems related to living in residential institution: Secondary | ICD-10-CM

## 2016-03-06 DIAGNOSIS — I6350 Cerebral infarction due to unspecified occlusion or stenosis of unspecified cerebral artery: Secondary | ICD-10-CM | POA: Diagnosis not present

## 2016-03-06 DIAGNOSIS — I482 Chronic atrial fibrillation, unspecified: Secondary | ICD-10-CM

## 2016-03-06 DIAGNOSIS — E8809 Other disorders of plasma-protein metabolism, not elsewhere classified: Secondary | ICD-10-CM

## 2016-03-06 DIAGNOSIS — G9389 Other specified disorders of brain: Secondary | ICD-10-CM

## 2016-03-06 DIAGNOSIS — G8929 Other chronic pain: Secondary | ICD-10-CM

## 2016-03-06 DIAGNOSIS — R2681 Unsteadiness on feet: Secondary | ICD-10-CM

## 2016-03-06 DIAGNOSIS — I10 Essential (primary) hypertension: Secondary | ICD-10-CM

## 2016-03-06 DIAGNOSIS — IMO0001 Reserved for inherently not codable concepts without codable children: Secondary | ICD-10-CM

## 2016-03-06 DIAGNOSIS — M545 Low back pain, unspecified: Secondary | ICD-10-CM

## 2016-03-06 DIAGNOSIS — I5022 Chronic systolic (congestive) heart failure: Secondary | ICD-10-CM

## 2016-03-06 DIAGNOSIS — Z789 Other specified health status: Secondary | ICD-10-CM

## 2016-03-06 DIAGNOSIS — F1021 Alcohol dependence, in remission: Secondary | ICD-10-CM

## 2016-03-06 DIAGNOSIS — I2510 Atherosclerotic heart disease of native coronary artery without angina pectoris: Secondary | ICD-10-CM

## 2016-03-06 DIAGNOSIS — F0390 Unspecified dementia without behavioral disturbance: Secondary | ICD-10-CM

## 2016-03-06 HISTORY — DX: Unspecified dementia, unspecified severity, without behavioral disturbance, psychotic disturbance, mood disturbance, and anxiety: F03.90

## 2016-03-06 HISTORY — DX: Cerebral infarction, unspecified: I63.9

## 2016-03-06 HISTORY — DX: Other specified health status: Z78.9

## 2016-03-06 HISTORY — DX: Age-related osteoporosis without current pathological fracture: M81.0

## 2016-03-06 HISTORY — DX: Other specified disorders of brain: G93.89

## 2016-03-06 HISTORY — DX: Problems related to living in residential institution: Z59.3

## 2016-03-06 NOTE — Assessment & Plan Note (Signed)
Pseudohypocalcemia. Normal phosphate. No renal failure Low albumin Will check Vitamin D

## 2016-03-06 NOTE — Assessment & Plan Note (Signed)
Established problem. Stable. Continue current physical therapy for balance
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
## 2016-03-06 NOTE — Assessment & Plan Note (Addendum)
FAST Stage: 6 (moderate-severe memory loss and difficulty with abstract concepts visuaospatial impairment and orientation to time Medication or substances that may impair patient's cognition:  Given vascular and substance-related dementia, ACHEI less likely to be helpful.  Would tryto stop Klonopin. Uncertain the indication in patient.  Will need taper.  Behavioral and Psychological Symptoms of Dementia: delusion of returing to live with mother (deceased) Financial, legal and medical advanced planning recommended to patient and caregivers: Forestine Na, niece, is consulting with an attorney about seeking HCPOA and durable POA.  Advised that if Mr uncle elopes, we cannot not stop him and seeking gaurdianship would be prudent.  I believe patient has adequate judgment and ability to evaluate in his best interest regarding his advanced directives and assignment of HC POA, and durable POA.

## 2016-03-06 NOTE — Assessment & Plan Note (Signed)
Established problem that has improved.  Abstinent 2 months now.

## 2016-03-06 NOTE — Assessment & Plan Note (Signed)
Established problem that has improved with PT at SNF.

## 2016-03-06 NOTE — Assessment & Plan Note (Signed)
Adequate blood pressure control.  (+) CAD/CABG.  (+) Cerebellar CVA.   Tolerating medication without significant adverse effects.  Plan to continue current blood pressure regiment.

## 2016-03-06 NOTE — Assessment & Plan Note (Signed)
Established problem. Stable. Continue current therapy  

## 2016-03-06 NOTE — Assessment & Plan Note (Signed)
Established problem.  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 Continue current therapy of abstinence, carvedilol, and spironolactone Titrate up lisinopril, carvedilol.  First increase Lisionpril from 5 mg to 10 mg daily.

## 2016-03-06 NOTE — Assessment & Plan Note (Signed)
Established problem Weight initially down from diuresis of edema Now slowly increasing in last couple weeks that may indicate improved nutrition Will check albumin

## 2016-03-06 NOTE — Assessment & Plan Note (Signed)
Established problem. Stable. Continue current therapy of APAP and Cymbalta that was started 01/02/16

## 2016-03-06 NOTE — Assessment & Plan Note (Signed)
Established problem Controlled Will consider start of DOAC if patient in safe setting on LTC

## 2016-03-06 NOTE — Assessment & Plan Note (Addendum)
New problem Normal TSH (12/2015) Currently on Calcium + Vitamin D and 600 unit Vitamin D supplement Will check total morning testosterone.  Would not be surprised if low bc of hx of alcohol dependence.  Will check vitamin d level. Will be a candidate for oral bisphosphonate.

## 2016-03-22 ENCOUNTER — Encounter: Payer: Self-pay | Admitting: Pharmacist

## 2016-03-22 NOTE — Telephone Encounter (Signed)
This encounter was created in error - please disregard.

## 2016-04-03 LAB — BASIC METABOLIC PANEL
BUN: 39 mg/dL — AB (ref 4–21)
Creatinine: 0.8 mg/dL (ref 0.6–1.3)
GLUCOSE: 86 mg/dL
POTASSIUM: 4.1 mmol/L (ref 3.4–5.3)
Sodium: 134 mmol/L — AB (ref 137–147)

## 2016-04-04 ENCOUNTER — Encounter: Payer: Self-pay | Admitting: *Deleted

## 2016-04-08 ENCOUNTER — Encounter: Payer: Self-pay | Admitting: Family Medicine

## 2016-04-08 LAB — COMPLETE METABOLIC PANEL WITH GFR
ALT: 14
ANION GAP: 18.3
AST: 18 U/L
Albumin: 3.8
BILIRUBIN TOTAL: 0.8 mg/dL
BUN/Creatinine Ratio: 51.6 — ABNORMAL HIGH
BUN: 39.2 — AB
CALCIUM: 9.2 mg/dL
CREATINE, SERUM: 0.76
Carbon Dioxide, Total: 23
Chloride: 97 — ABNORMAL LOW
GLUCOSE: 91
Osmolality: 277.3 — ABNORMAL LOW
Potassium, serum: 3.8
SODIUM, SERUM: 134 — AB
Total Protein: 5.9 g/dL — ABNORMAL LOW
Vitamin B-12: 434

## 2016-04-09 ENCOUNTER — Ambulatory Visit: Payer: Self-pay | Admitting: Cardiovascular Disease

## 2016-04-11 ENCOUNTER — Encounter: Payer: Self-pay | Admitting: Family Medicine

## 2016-04-11 ENCOUNTER — Encounter: Payer: Self-pay | Admitting: Cardiovascular Disease

## 2016-04-11 ENCOUNTER — Ambulatory Visit (INDEPENDENT_AMBULATORY_CARE_PROVIDER_SITE_OTHER): Payer: Medicare Other | Admitting: Cardiovascular Disease

## 2016-04-11 VITALS — BP 86/49 | HR 51 | Ht 71.0 in | Wt 160.0 lb

## 2016-04-11 DIAGNOSIS — I952 Hypotension due to drugs: Secondary | ICD-10-CM

## 2016-04-11 DIAGNOSIS — I25810 Atherosclerosis of coronary artery bypass graft(s) without angina pectoris: Secondary | ICD-10-CM

## 2016-04-11 DIAGNOSIS — I482 Chronic atrial fibrillation: Secondary | ICD-10-CM | POA: Diagnosis not present

## 2016-04-11 DIAGNOSIS — I5022 Chronic systolic (congestive) heart failure: Secondary | ICD-10-CM | POA: Diagnosis not present

## 2016-04-11 DIAGNOSIS — Z789 Other specified health status: Secondary | ICD-10-CM

## 2016-04-11 DIAGNOSIS — I447 Left bundle-branch block, unspecified: Secondary | ICD-10-CM

## 2016-04-11 DIAGNOSIS — I4821 Permanent atrial fibrillation: Secondary | ICD-10-CM

## 2016-04-11 DIAGNOSIS — N62 Hypertrophy of breast: Secondary | ICD-10-CM

## 2016-04-11 DIAGNOSIS — F1027 Alcohol dependence with alcohol-induced persisting dementia: Secondary | ICD-10-CM

## 2016-04-11 DIAGNOSIS — F1097 Alcohol use, unspecified with alcohol-induced persisting dementia: Secondary | ICD-10-CM

## 2016-04-11 HISTORY — DX: Hypotension due to drugs: I95.2

## 2016-04-11 HISTORY — DX: Atherosclerosis of coronary artery bypass graft(s) without angina pectoris: I25.810

## 2016-04-11 LAB — TESTOSTERONE: Testosterone, Serum: 338.1
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
## 2016-04-11 NOTE — Patient Instructions (Signed)
Medication Instructions: Dr Sallyanne Kuster has recommended making the following medication changes: 1. STOP Spironolactone  Labwork: Your physician recommends that you return for lab work in 2 weeks.  Testing/Procedures: NONE ORDERED  Follow-up: Your physician recommends that you schedule a follow-up appointment in 4 weeks with Rosaria Ferries, PA.  Dr Sallyanne Kuster recommends that you schedule a follow-up appointment in 3 months.  If you need a refill on your cardiac medications before your next appointment, please call your pharmacy.

## 2016-04-15 DIAGNOSIS — I5020 Unspecified systolic (congestive) heart failure: Secondary | ICD-10-CM

## 2016-04-15 HISTORY — DX: Unspecified systolic (congestive) heart failure: I50.20

## 2016-04-19 ENCOUNTER — Non-Acute Institutional Stay: Payer: Medicare Other | Admitting: Family Medicine

## 2016-04-19 ENCOUNTER — Encounter: Payer: Self-pay | Admitting: Family Medicine

## 2016-04-19 DIAGNOSIS — K760 Fatty (change of) liver, not elsewhere classified: Secondary | ICD-10-CM

## 2016-04-19 DIAGNOSIS — R269 Unspecified abnormalities of gait and mobility: Secondary | ICD-10-CM

## 2016-04-19 DIAGNOSIS — K08109 Complete loss of teeth, unspecified cause, unspecified class: Secondary | ICD-10-CM

## 2016-04-19 DIAGNOSIS — F0390 Unspecified dementia without behavioral disturbance: Secondary | ICD-10-CM

## 2016-04-19 DIAGNOSIS — R1031 Right lower quadrant pain: Secondary | ICD-10-CM

## 2016-04-19 DIAGNOSIS — I4821 Permanent atrial fibrillation: Secondary | ICD-10-CM

## 2016-04-19 DIAGNOSIS — Z950 Presence of cardiac pacemaker: Secondary | ICD-10-CM

## 2016-04-19 DIAGNOSIS — I482 Chronic atrial fibrillation: Secondary | ICD-10-CM

## 2016-04-19 DIAGNOSIS — M545 Low back pain: Secondary | ICD-10-CM

## 2016-04-19 DIAGNOSIS — Z66 Do not resuscitate: Secondary | ICD-10-CM

## 2016-04-19 DIAGNOSIS — G8929 Other chronic pain: Secondary | ICD-10-CM

## 2016-04-19 DIAGNOSIS — K000 Anodontia: Secondary | ICD-10-CM

## 2016-04-19 DIAGNOSIS — F1020 Alcohol dependence, uncomplicated: Secondary | ICD-10-CM

## 2016-04-19 DIAGNOSIS — R1032 Left lower quadrant pain: Secondary | ICD-10-CM

## 2016-04-19 DIAGNOSIS — I5022 Chronic systolic (congestive) heart failure: Secondary | ICD-10-CM

## 2016-04-19 HISTORY — DX: Right lower quadrant pain: R10.31

## 2016-04-19 HISTORY — DX: Complete loss of teeth, unspecified cause, unspecified class: K08.109

## 2016-04-19 HISTORY — DX: Alcohol dependence, uncomplicated: F10.20

## 2016-04-19 HISTORY — DX: Presence of cardiac pacemaker: Z95.0

## 2016-04-19 NOTE — Assessment & Plan Note (Signed)
New problem No further assesment at this time.  Patient with hx of chronic constipation. Given the alternating diarrhea and constipation, will try trial of metamucil to bring BM consistency towards the middle.  Will monitor for BMs and abdominal pain change or resolution.
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
## 2016-04-19 NOTE — Assessment & Plan Note (Signed)
Established problem Controlled Continue Lisinopril and carvedilol. Cardiology's opinion is that patient has less than a year survival.  Goal of palliation. Patient is DNR per HCPOA, Tanna Furry (Niece of patient)

## 2016-04-19 NOTE — Assessment & Plan Note (Signed)
Established problem Uncontrollled Will continue Duloxetine 30 mg daily Will add Acetaminophen (<2000 mg / day given hepatic disease (steatosis)

## 2016-04-19 NOTE — Assessment & Plan Note (Signed)
Discussing MOST form with patient and HCPOA, Tanna Furry (Niece) would be helpful.

## 2016-04-19 NOTE — Assessment & Plan Note (Signed)
Established problem Controlled Continue Asa 81 mg daily Adequate rate control.  On Carvedilol bid.

## 2016-04-26 ENCOUNTER — Encounter: Payer: Self-pay | Admitting: Family Medicine

## 2016-04-26 ENCOUNTER — Non-Acute Institutional Stay: Payer: Medicare Other | Admitting: Family Medicine

## 2016-04-26 DIAGNOSIS — R7989 Other specified abnormal findings of blood chemistry: Secondary | ICD-10-CM | POA: Diagnosis not present

## 2016-04-26 LAB — PRO B NATRIURETIC PEPTIDE
MAGNESIUM: 1.4
NT-Pro BNP: 2838

## 2016-04-26 LAB — BASIC METABOLIC PANEL
BUN: 41 mg/dL — AB (ref 4–21)
Creatinine: 0.9 mg/dL (ref 0.6–1.3)
Glucose: 87 mg/dL
POTASSIUM: 4.3 mmol/L (ref 3.4–5.3)
SODIUM: 139 mmol/L (ref 137–147)

## 2016-04-26 NOTE — Progress Notes (Signed)
[place] is alone Sources of clinical information for visit is/are patient.  HISTORY OF PRESENT ILLNESS: [name] is a 81 y.o.  male.    No chief complaint on file.  HPI:  Patient reports that overall he is doing well.  He reports some tiredness after PT today.  Denies SOB, edema, CP, decreased appetite.    Of note, Cardiology ordered f/u testing after Spironolactone was discontinued recently.  His pro-BNP was elevated to >2800.  Mg noted to be somewhat low.   History Patient Active Problem List   Diagnosis Date Noted  . Alcohol dependence in controlled environment ([place]) 04/19/2016  . Edentulous 04/19/2016  . Abnormality of gait 04/19/2016  . Cardiac pacemaker in situ 04/19/2016  . Abdominal pain, acute, bilateral lower quadrant 04/19/2016  . DNR (do not resuscitate) 04/19/2016  . ACC/AHA stage D systolic heart failure ([place]) 04/15/2016  . Coronary artery disease involving coronary bypass graft of native heart without angina pectoris 04/11/2016  . Hypotension due to drugs 04/11/2016  . Cerebellar stroke ([place]) 03/06/2016  . Encephalomalacia with cerebral infarction ([place]) 03/06/2016  . Living in nursing home 03/06/2016  . Osteoporosis 03/06/2016  . Dementia 03/06/2016  . Hypoalbuminemia 01/09/2016  . Secondary cardiomyopathy ([place]) 01/02/2016  . Weakness generalized 12/25/2015  . Hepatic steatosis 12/25/2015  . Coronary artery disease involving native coronary artery of native heart without angina pectoris   . Chronic systolic heart failure ([place])   . Vertebral compression fracture ([place]) 04/07/2014  . At high risk for falls 04/24/2010  . Atrial fibrillation ([place]) 12/14/2009  . HYPERTRIGLYCERIDEMIA 05/29/2006  . Chronic low back pain 05/29/2006  . PROSTATE CANCER, HX OF 05/29/2006   Medications  Current Outpatient Prescriptions:  .  Amino Acids-Protein Hydrolys (FEEDING SUPPLEMENT, PRO-STAT SUGAR FREE 64,) LIQD, Take 30 mLs by mouth  2 (two) times daily., Disp: , Rfl:  .  aspirin EC 81 MG tablet, Take 1 tablet (81 mg total) by mouth daily., Disp: 90 tablet, Rfl: 1 .  atorvastatin (LIPITOR) 40 MG tablet, TAKE 1 TABLET BY MOUTH EVERY DAY, Disp: 90 tablet, Rfl: 0 .  Calcium Carb-Cholecalciferol (CALCIUM 1000 + D PO), Take one tablet by mouth once daily, Disp: , Rfl:  .  carvedilol (COREG) 6.25 MG tablet, Take 6.25 mg by mouth 2 (two) times daily with a meal., Disp: , Rfl:  .  donepezil (ARICEPT) 10 MG tablet, Take 10 mg by mouth at bedtime., Disp: , Rfl:  .  DULoxetine (CYMBALTA) 30 MG capsule, Take 30 mg by mouth daily., Disp: , Rfl:  .  ferrous sulfate 325 (65 FE) MG tablet, TAKE 1 TABLET BY MOUTH EVERY DAY WITH BREAKFAST, Disp: 90 tablet, Rfl: 0 .  folic acid (FOLVITE) 1 MG tablet, Take 1 mg by mouth daily., Disp: , Rfl:  .  furosemide (LASIX) 20 MG tablet, Take 20 mg by mouth. Take every Friday, Disp: , Rfl:  .  furosemide (LASIX) 40 MG tablet, Take 40 mg by mouth daily. , Disp: , Rfl:  .  lisinopril (PRINIVIL,ZESTRIL) 5 MG tablet, Take 1 tablet (5 mg total) by mouth at bedtime., Disp: 90 tablet, Rfl: 3 .  magnesium chloride (SLOW-MAG) 64 MG TBEC SR tablet, Take 3 tablets by mouth daily., Disp: , Rfl:  .  Multiple Vitamin (MULTIVITAMIN WITH MINERALS) TABS tablet, Take 1 tablet by mouth daily., Disp: , Rfl:  .  NITROSTAT 0.4 MG SL tablet, PLACE 1 TABLET UNDER THE TONGUE EVERY 5 MINUTES  AS NEEDED FOR CHEST PAIN AS DIRECTED (Patient not taking: Reported on 04/19/2016), Disp: 25 tablet, Rfl: 0 .  psyllium (METAMUCIL) 58.6 % powder, Take 1 packet by mouth daily., Disp: , Rfl:  .  thiamine (VITAMIN B-1) 100 MG tablet, Take 100 mg by mouth daily., Disp: , Rfl:  .  VITAMIN D, CHOLECALCIFEROL, PO, Take 600 Int'l Units by mouth daily at 6 (six) AM., Disp: , Rfl:    Vitals:   04/19/16 1456  BP: 126/72  Pulse: (!) 59  Resp: 20  Temp: 98.2 F (36.8 C)    Wt Readings from Last 3 Encounters:  04/03/16 162 lb 3.2 oz (73.6 kg)   04/11/16 160 lb (72.6 kg)  02/26/16 154 lb 6.4 oz (70 kg)     Review of Systems:   General: Denies fevers, chills, weight loss, weight gain.  Endorses intermittent fatigue associated w/ PT Cardiovascular: Denies chest pains, peripheral edema.  Respiratory: Denies SOB  PHYSICAL EXAM:. General: No acute distress, well nourished, pleasant CV:  irregular, no murmur, no LE swelling RESP: No resp distress or accessory muscle use.  Clear to ausc bilat. No wheezing, no rales, no rhonchi.   Assessment(s)/Plan:    1. Hypomagnesemia.  Mg 1.4.  Normal renal function.  - MagOx 400mg  QD w. A meal originally ordered but patient actually on SlowMag 2 tablet daily already.  MagOx 400mg  daily order verbally cancelled with [place] hall RN on 04/26/16 @ 3:13pm.  New verbal order given to increase SlowMag to 3 tablets daily by mouth.  Read back order confirmed. - Patient aware of medication addition  2. Elevated brain natriuretic peptide (BNP) level.  Pro BNP >2800.  Clinically, without signs of heart failure exacerbation.  VSS. - Monitor for signs of fluid overload    Code Status:     Code Status History    Date Active Date Inactive Code Status Order ID Comments User Context   12/25/2015  3:46 AM 12/28/2015  6:59 PM Full Code [id number]  Rise Patience, MD Inpatient   01/15/2015  2:51 AM 01/15/2015  7:43 PM Full Code [id number]  Archie Patten, MD Inpatient   04/08/2014  3:53 PM 04/09/2014  3:04 PM Full Code [id number]  Jettie Booze, MD Inpatient   04/07/2014  1:18 AM 04/08/2014  3:53 PM Full Code [id number]  Leone Brand, MD Inpatient   02/21/2013 12:56 PM 02/24/2013  3:01 PM Full Code [id number]  Erline Hau, MD Inpatient   10/24/2012 10:52 AM 10/29/2012  8:54 PM Full Code [id number]  Delfin Gant, NP Inpatient   10/23/2012  6:50 AM 10/24/2012 10:52 AM Full Code [id number]  Monico Blitz, PA-C ED   10/21/2012 12:30 AM 10/21/2012  7:14 PM Full Code [id number]  Orvan Falconer, MD Inpatient    10/04/2012 10:09 PM 10/05/2012  9:45 PM Full Code [id number]  Nolon Rod, DO Inpatient   09/13/2012  4:35 AM 09/16/2012  6:46 PM Full Code [id number]  Emmaline Kluver, MD Inpatient   09/11/2011  2:03 AM 09/13/2011  5:15 PM Full Code [id number]  Bronson Curb, RN Inpatient        Medications Discontinued During This Encounter  Medication Reason  . magnesium oxide (MAG-OX) 400 MG tablet      Ashly M. Lajuana Ripple, DO PGY-3, [facility]

## 2016-04-29 NOTE — Addendum Note (Signed)
Addended byWendy Poet, Allaya Abbasi D on: 04/29/2016 02:39 PM   Modules accepted: Level of Service

## 2016-04-29 NOTE — Progress Notes (Signed)
I have interviewed and examined the patient.  I have discussed the case and verified the key findings with Dr. Lajuana Ripple.   I agree with their assessments and plans as documented in their visit note.

## 2016-05-01 ENCOUNTER — Other Ambulatory Visit: Payer: Self-pay | Admitting: Family Medicine

## 2016-05-01 MED ORDER — ACETAMINOPHEN ER 650 MG PO TBCR: 650.0000 mg | EXTENDED_RELEASE_TABLET | Freq: Three times a day (TID) | ORAL | 0 refills | Status: AC | PRN

## 2016-05-01 NOTE — Progress Notes (Signed)
Tylenol 650mg  PO q8 for LBP added.

## 2016-05-03 NOTE — Addendum Note (Signed)
Addended byWendy Poet, TODD D on: 05/03/2016 10:39 AM   Modules accepted: Level of Service

## 2016-05-15 ENCOUNTER — Non-Acute Institutional Stay (INDEPENDENT_AMBULATORY_CARE_PROVIDER_SITE_OTHER): Payer: Medicare Other | Admitting: Family Medicine

## 2016-05-15 DIAGNOSIS — Z9181 History of falling: Secondary | ICD-10-CM

## 2016-05-15 DIAGNOSIS — I5020 Unspecified systolic (congestive) heart failure: Secondary | ICD-10-CM

## 2016-05-15 DIAGNOSIS — F1020 Alcohol dependence, uncomplicated: Secondary | ICD-10-CM

## 2016-05-15 DIAGNOSIS — I639 Cerebral infarction, unspecified: Secondary | ICD-10-CM

## 2016-05-15 DIAGNOSIS — I4821 Permanent atrial fibrillation: Secondary | ICD-10-CM

## 2016-05-15 DIAGNOSIS — R531 Weakness: Secondary | ICD-10-CM

## 2016-05-15 DIAGNOSIS — I482 Chronic atrial fibrillation: Secondary | ICD-10-CM

## 2016-05-15 DIAGNOSIS — R269 Unspecified abnormalities of gait and mobility: Secondary | ICD-10-CM

## 2016-05-15 NOTE — Progress Notes (Signed)
[name] Hospital Discharge Summary  Patient name: [name] Medical record number: [id number] Date of birth: May 18, 1935 Age: 81 y.o. Gender: male Date of Admission: (Not on file)  Date of Discharge: 05/14/16 Admitting Physician: No admitting provider for patient encounter.  Primary Care Provider: MCDIARMID,TODD D, MD/ Dossie Arbour, MD Consultants: Cardiology  Indication for Hospitalization: deconditioning   Discharge Diagnoses/Problem List:  Weakness generalized  At high risk for falls  Alcohol dependence in controlled environment [facility])  Abnormality of gait  ACC/AHA stage D systolic heart failure (HCC)  Cerebellar stroke ([place])  Permanent atrial fibrillation ([place])  Disposition: Home AMA  Discharge Exam: Not completed, as patient left AMA.  Brief Hospital Course:  [name] is a 81 y.o. male that was admitted to [facility] for decompensation/ deconditioning secondary to nonadherance to medications/ exacerbation of underlying chronic diseases.  Patient received PT, nursing care during [facility] stay.  He was seen by his Cardiologist on 04/11/16 for his advanced CHF.  Patient's prognosis at that time was poor and the assessment is that he likely has less than 1 year estimated survival.  He was noted to have hypotension/ hypovolemia/ gynecomastia so his Spironolactone was discontinued.  He is a poor candidate for anticoagulation.  He was supposed to have a follow up appt with Cardiology 2/15.  His BP and electrolytes were monitored off Spironolactone.  These both remained stable during nursing home stay.  On 2/13, the geriatric team received a call informing us that the patient had decided to leave AMA.  Appropriate paperwork filled out by patient and his partner.  Patient's niece, [name], was informed by nursing home staff.  Issues for Follow Up:  1. Cardiology appt 2/15; Arlyce Harman discontinued  Significant Procedures:  n/a  Significant Labs and Imaging:  No results for input(s): WBC, HGB, HCT, PLT in the last 168 hours. No results for input(s): Coffey, K, CL, CO2, GLUCOSE, BUN, CREATININE, CALCIUM, MG, PHOS, ALKPHOS, AST, ALT, ALBUMIN, PROTEIN in the last 168 hours.  Invalid input(s): TBILI  Results/Tests Pending at Time of Discharge:   Discharge Medications:  Allergies as of 05/15/2016      Reactions   Metoprolol Other (See Comments)   Sinus Pauses      Medication List       Accurate as of 05/15/16  3:29 PM. Always use your most recent med list.          acetaminophen 650 MG CR tablet Commonly known as:  TYLENOL 8 HOUR Take 1 tablet (650 mg total) by mouth every 8 (eight) hours as needed for pain.   aspirin EC 81 MG tablet Take 1 tablet (81 mg total) by mouth daily.   atorvastatin 40 MG tablet Commonly known as:  LIPITOR TAKE 1 TABLET BY MOUTH EVERY DAY   CALCIUM 1000 + D PO Take one tablet by mouth once daily   carvedilol 6.25 MG tablet Commonly known as:  COREG Take 6.25 mg by mouth 2 (two) times daily with a meal.   donepezil 10 MG tablet Commonly known as:  ARICEPT Take 10 mg by mouth at bedtime.   DULoxetine 30 MG capsule Commonly known as:  CYMBALTA Take 30 mg by mouth daily.   feeding supplement (PRO-STAT SUGAR FREE 64) Liqd Take 30 mLs by mouth 2 (two) times daily.   ferrous sulfate 325 (65 FE) MG tablet TAKE 1 TABLET BY MOUTH EVERY DAY WITH BREAKFAST   folic acid 1 MG tablet Commonly known as:  FOLVITE Take 1 mg by mouth daily.   furosemide 40 MG tablet Commonly known as:  LASIX Take 40 mg by mouth daily.   furosemide 20 MG tablet Commonly known as:  LASIX Take 20 mg by mouth. Take every Friday   lisinopril 5 MG tablet Commonly known as:  PRINIVIL,ZESTRIL Take 1 tablet (5 mg total) by mouth at bedtime.   magnesium chloride 64 MG Tbec SR tablet Commonly known as:  SLOW-MAG Take 3 tablets by mouth daily.   multivitamin with minerals Tabs tablet Take 1  tablet by mouth daily.   NITROSTAT 0.4 MG SL tablet Generic drug:  nitroGLYCERIN PLACE 1 TABLET UNDER THE TONGUE EVERY 5 MINUTES AS NEEDED FOR CHEST PAIN AS DIRECTED   psyllium 58.6 % powder Commonly known as:  METAMUCIL Take 1 packet by mouth daily.   thiamine 100 MG tablet Commonly known as:  VITAMIN B-1 Take 100 mg by mouth daily.   VITAMIN D (CHOLECALCIFEROL) PO Take 600 Int'l Units by mouth daily at 6 (six) AM.       Discharge Instructions: Please refer to Patient Instructions section of EMR for full details.  Patient was counseled important signs and symptoms that should prompt return to medical care, changes in medications, dietary instructions, activity restrictions, and follow up appointments.     Janora Norlander, DO 05/15/2016, 3:29 PM PGY-3, [place]

## 2016-05-16 ENCOUNTER — Ambulatory Visit: Payer: Medicare Other | Admitting: Physician Assistant

## 2016-05-22 ENCOUNTER — Observation Stay (HOSPITAL_COMMUNITY)
Admission: EM | Admit: 2016-05-22 | Discharge: 2016-05-23 | Disposition: A | Discharge status: Home / Self Care | Payer: Medicare Other | Attending: Family Medicine | Admitting: Family Medicine

## 2016-05-22 ENCOUNTER — Emergency Department (HOSPITAL_COMMUNITY): Payer: Medicare Other

## 2016-05-22 ENCOUNTER — Encounter (HOSPITAL_COMMUNITY): Payer: Self-pay

## 2016-05-22 DIAGNOSIS — Z66 Do not resuscitate: Secondary | ICD-10-CM | POA: Diagnosis not present

## 2016-05-22 DIAGNOSIS — I110 Hypertensive heart disease with heart failure: Secondary | ICD-10-CM | POA: Insufficient documentation

## 2016-05-22 DIAGNOSIS — F0390 Unspecified dementia without behavioral disturbance: Secondary | ICD-10-CM | POA: Insufficient documentation

## 2016-05-22 DIAGNOSIS — F10220 Alcohol dependence with intoxication, uncomplicated: Secondary | ICD-10-CM | POA: Insufficient documentation

## 2016-05-22 DIAGNOSIS — Z87891 Personal history of nicotine dependence: Secondary | ICD-10-CM | POA: Insufficient documentation

## 2016-05-22 DIAGNOSIS — M810 Age-related osteoporosis without current pathological fracture: Secondary | ICD-10-CM | POA: Insufficient documentation

## 2016-05-22 DIAGNOSIS — I4891 Unspecified atrial fibrillation: Secondary | ICD-10-CM | POA: Insufficient documentation

## 2016-05-22 DIAGNOSIS — Z8673 Personal history of transient ischemic attack (TIA), and cerebral infarction without residual deficits: Secondary | ICD-10-CM | POA: Diagnosis not present

## 2016-05-22 DIAGNOSIS — J690 Pneumonitis due to inhalation of food and vomit: Secondary | ICD-10-CM | POA: Diagnosis present

## 2016-05-22 DIAGNOSIS — G8929 Other chronic pain: Secondary | ICD-10-CM | POA: Insufficient documentation

## 2016-05-22 DIAGNOSIS — E785 Hyperlipidemia, unspecified: Secondary | ICD-10-CM | POA: Insufficient documentation

## 2016-05-22 DIAGNOSIS — M1990 Unspecified osteoarthritis, unspecified site: Secondary | ICD-10-CM | POA: Diagnosis not present

## 2016-05-22 DIAGNOSIS — E781 Pure hyperglyceridemia: Secondary | ICD-10-CM | POA: Insufficient documentation

## 2016-05-22 DIAGNOSIS — Z950 Presence of cardiac pacemaker: Secondary | ICD-10-CM | POA: Insufficient documentation

## 2016-05-22 DIAGNOSIS — M545 Low back pain: Secondary | ICD-10-CM | POA: Insufficient documentation

## 2016-05-22 DIAGNOSIS — J189 Pneumonia, unspecified organism: Principal | ICD-10-CM | POA: Diagnosis present

## 2016-05-22 DIAGNOSIS — I5022 Chronic systolic (congestive) heart failure: Secondary | ICD-10-CM | POA: Insufficient documentation

## 2016-05-22 DIAGNOSIS — I252 Old myocardial infarction: Secondary | ICD-10-CM | POA: Insufficient documentation

## 2016-05-22 DIAGNOSIS — I25810 Atherosclerosis of coronary artery bypass graft(s) without angina pectoris: Secondary | ICD-10-CM | POA: Insufficient documentation

## 2016-05-22 DIAGNOSIS — F1010 Alcohol abuse, uncomplicated: Secondary | ICD-10-CM | POA: Diagnosis present

## 2016-05-22 DIAGNOSIS — F329 Major depressive disorder, single episode, unspecified: Secondary | ICD-10-CM | POA: Diagnosis not present

## 2016-05-22 DIAGNOSIS — E7800 Pure hypercholesterolemia, unspecified: Secondary | ICD-10-CM | POA: Insufficient documentation

## 2016-05-22 DIAGNOSIS — K219 Gastro-esophageal reflux disease without esophagitis: Secondary | ICD-10-CM | POA: Insufficient documentation

## 2016-05-22 HISTORY — DX: Unspecified systolic (congestive) heart failure: I50.20

## 2016-05-22 HISTORY — DX: Fatty (change of) liver, not elsewhere classified: K76.0

## 2016-05-22 HISTORY — DX: Pure hyperglyceridemia: E78.1

## 2016-05-22 HISTORY — DX: Age-related osteoporosis without current pathological fracture: M81.0

## 2016-05-22 HISTORY — DX: Unspecified atrial fibrillation: I48.91

## 2016-05-22 HISTORY — DX: Alcohol abuse, uncomplicated: F10.10

## 2016-05-22 HISTORY — DX: Right lower quadrant pain: R10.31

## 2016-05-22 HISTORY — DX: Left lower quadrant pain: R10.32

## 2016-05-22 HISTORY — DX: Collapsed vertebra, not elsewhere classified, site unspecified, initial encounter for fracture: M48.50XA

## 2016-05-22 HISTORY — DX: Anodontia: K00.0

## 2016-05-22 HISTORY — DX: Cardiomyopathy, unspecified: I42.9

## 2016-05-22 LAB — COMPREHENSIVE METABOLIC PANEL
ALBUMIN: 3.6 g/dL (ref 3.5–5.0)
ALK PHOS: 48 U/L (ref 38–126)
ALT: 16 U/L — AB (ref 17–63)
AST: 18 U/L (ref 15–41)
Anion gap: 9 (ref 5–15)
BUN: 15 mg/dL (ref 6–20)
CALCIUM: 10.1 mg/dL (ref 8.9–10.3)
CHLORIDE: 107 mmol/L (ref 101–111)
CO2: 23 mmol/L (ref 22–32)
CREATININE: 0.83 mg/dL (ref 0.61–1.24)
GFR calc Af Amer: >60 mL/min (ref >60)
GFR calc non Af Amer: >60 mL/min (ref >60)
GLUCOSE: 82 mg/dL (ref 65–99)
Potassium: 3.4 mmol/L — ABNORMAL LOW (ref 3.5–5.1)
SODIUM: 139 mmol/L (ref 135–145)
Total Bilirubin: 1.1 mg/dL (ref 0.3–1.2)
Total Protein: 6.8 g/dL (ref 6.5–8.1)

## 2016-05-22 LAB — I-STAT CHEM 8, ED
BUN: 17 mg/dL (ref 6–20)
CHLORIDE: 103 mmol/L (ref 101–111)
Calcium, Ion: 1.29 mmol/L (ref 1.15–1.40)
Creatinine, Ser: 1.1 mg/dL (ref 0.61–1.24)
GLUCOSE: 78 mg/dL (ref 65–99)
HCT: 25 % — ABNORMAL LOW (ref 39.0–52.0)
Hemoglobin: 8.5 g/dL — ABNORMAL LOW (ref 13.0–17.0)
POTASSIUM: 3.4 mmol/L — AB (ref 3.5–5.1)
Sodium: 140 mmol/L (ref 135–145)
TCO2: 22 mmol/L (ref 0–100)

## 2016-05-22 LAB — CBC WITH DIFFERENTIAL/PLATELET
BASOS ABS: 0 10*3/uL (ref 0.0–0.1)
BASOS PCT: 0 %
EOS ABS: 0.2 10*3/uL (ref 0.0–0.7)
Eosinophils Relative: 4 %
HCT: 27.6 % — ABNORMAL LOW (ref 39.0–52.0)
HEMOGLOBIN: 8.9 g/dL — AB (ref 13.0–17.0)
LYMPHS ABS: 2 10*3/uL (ref 0.7–4.0)
Lymphocytes Relative: 39 %
MCH: 29 pg (ref 26.0–34.0)
MCHC: 32.2 g/dL (ref 30.0–36.0)
MCV: 89.9 fL (ref 78.0–100.0)
Monocytes Absolute: 0.2 10*3/uL (ref 0.1–1.0)
Monocytes Relative: 4 %
NEUTROS PCT: 53 %
Neutro Abs: 2.6 10*3/uL (ref 1.7–7.7)
Platelets: 165 10*3/uL (ref 150–400)
RBC: 3.07 MIL/uL — AB (ref 4.22–5.81)
RDW: 15 % (ref 11.5–15.5)
WBC: 5 10*3/uL (ref 4.0–10.5)

## 2016-05-22 LAB — I-STAT TROPONIN, ED: TROPONIN I, POC: 0.01 ng/mL (ref 0.00–0.08)

## 2016-05-22 LAB — BRAIN NATRIURETIC PEPTIDE: B NATRIURETIC PEPTIDE 5: 475.4 pg/mL — AB (ref 0.0–100.0)

## 2016-05-22 LAB — ETHANOL: ALCOHOL ETHYL (B): 199 mg/dL — AB (ref <5)

## 2016-05-22 MED ORDER — PIPERACILLIN-TAZOBACTAM 3.375 G IVPB 30 MIN
3.3750 g | Freq: Once | INTRAVENOUS | Status: AC
  Administered 2016-05-23: 3.375 g via INTRAVENOUS
  Filled 2016-05-22: qty 50

## 2016-05-22 NOTE — ED Notes (Signed)
MD at bedside. 

## 2016-05-22 NOTE — ED Triage Notes (Signed)
Pt arrived via [facility] from home, etoh on board, no general complaints, EMS reports wife wanted him to come be checked out.

## 2016-05-22 NOTE — ED Notes (Signed)
Patient transported to CT 
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
## 2016-05-23 ENCOUNTER — Encounter (HOSPITAL_COMMUNITY): Payer: Self-pay | Admitting: Family Medicine

## 2016-05-23 DIAGNOSIS — R531 Weakness: Secondary | ICD-10-CM

## 2016-05-23 DIAGNOSIS — J189 Pneumonia, unspecified organism: Secondary | ICD-10-CM | POA: Diagnosis present

## 2016-05-23 DIAGNOSIS — J690 Pneumonitis due to inhalation of food and vomit: Secondary | ICD-10-CM

## 2016-05-23 DIAGNOSIS — F1010 Alcohol abuse, uncomplicated: Secondary | ICD-10-CM

## 2016-05-23 LAB — CBC
HEMATOCRIT: 25.7 % — AB (ref 39.0–52.0)
HEMOGLOBIN: 8.3 g/dL — AB (ref 13.0–17.0)
MCH: 29 pg (ref 26.0–34.0)
MCHC: 32.3 g/dL (ref 30.0–36.0)
MCV: 89.9 fL (ref 78.0–100.0)
Platelets: 152 10*3/uL (ref 150–400)
RBC: 2.86 MIL/uL — ABNORMAL LOW (ref 4.22–5.81)
RDW: 15 % (ref 11.5–15.5)
WBC: 3.7 10*3/uL — AB (ref 4.0–10.5)

## 2016-05-23 LAB — MRSA PCR SCREENING: MRSA BY PCR: NEGATIVE

## 2016-05-23 LAB — BASIC METABOLIC PANEL
ANION GAP: 9 (ref 5–15)
BUN: 14 mg/dL (ref 6–20)
CO2: 24 mmol/L (ref 22–32)
Calcium: 9.9 mg/dL (ref 8.9–10.3)
Chloride: 107 mmol/L (ref 101–111)
Creatinine, Ser: 0.91 mg/dL (ref 0.61–1.24)
Glucose, Bld: 75 mg/dL (ref 65–99)
POTASSIUM: 3.6 mmol/L (ref 3.5–5.1)
SODIUM: 140 mmol/L (ref 135–145)

## 2016-05-23 LAB — PROTIME-INR
INR: 1.11
Prothrombin Time: 14.4 seconds (ref 11.4–15.2)

## 2016-05-23 LAB — MAGNESIUM: Magnesium: 1.3 mg/dL — ABNORMAL LOW (ref 1.7–2.4)

## 2016-05-23 MED ORDER — LISINOPRIL 5 MG PO TABS: 5.0000 mg | ORAL_TABLET | Freq: Every day | ORAL | Status: DC

## 2016-05-23 MED ORDER — FOLIC ACID 1 MG PO TABS
1.0000 mg | ORAL_TABLET | Freq: Every day | ORAL | Status: DC
  Administered 2016-05-23: 1 mg via ORAL
  Filled 2016-05-23: qty 1

## 2016-05-23 MED ORDER — FUROSEMIDE 40 MG PO TABS
40.0000 mg | ORAL_TABLET | Freq: Every day | ORAL | Status: DC
  Administered 2016-05-23: 40 mg via ORAL
  Filled 2016-05-23: qty 1

## 2016-05-23 MED ORDER — ENOXAPARIN SODIUM 40 MG/0.4ML ~~LOC~~ SOLN
40.0000 mg | Freq: Every day | SUBCUTANEOUS | Status: DC
  Administered 2016-05-23: 40 mg via SUBCUTANEOUS
  Filled 2016-05-23: qty 0.4

## 2016-05-23 MED ORDER — ASPIRIN EC 81 MG PO TBEC
81.0000 mg | DELAYED_RELEASE_TABLET | Freq: Every day | ORAL | Status: DC
  Administered 2016-05-23: 81 mg via ORAL
  Filled 2016-05-23: qty 1

## 2016-05-23 MED ORDER — AMOXICILLIN-POT CLAVULANATE 875-125 MG PO TABS: 1.0000 | ORAL_TABLET | Freq: Two times a day (BID) | ORAL | Status: DC

## 2016-05-23 MED ORDER — PSYLLIUM 95 % PO PACK
1.0000 | PACK | Freq: Every day | ORAL | Status: DC
  Administered 2016-05-23: 1 via ORAL
  Filled 2016-05-23: qty 1

## 2016-05-23 MED ORDER — ADULT MULTIVITAMIN W/MINERALS CH
1.0000 | ORAL_TABLET | Freq: Every day | ORAL | Status: DC
  Administered 2016-05-23: 1 via ORAL
  Filled 2016-05-23: qty 1

## 2016-05-23 MED ORDER — DONEPEZIL HCL 10 MG PO TABS: 10.0000 mg | ORAL_TABLET | Freq: Every day | ORAL | Status: DC

## 2016-05-23 MED ORDER — CARVEDILOL 6.25 MG PO TABS
6.2500 mg | ORAL_TABLET | Freq: Two times a day (BID) | ORAL | Status: DC
  Administered 2016-05-23 (×2): 6.25 mg via ORAL
  Filled 2016-05-23 (×2): qty 1

## 2016-05-23 MED ORDER — THIAMINE HCL 100 MG/ML IJ SOLN: 100.0000 mg | Freq: Every day | INTRAMUSCULAR | Status: DC

## 2016-05-23 MED ORDER — DULOXETINE HCL 30 MG PO CPEP
30.0000 mg | ORAL_CAPSULE | Freq: Every day | ORAL | Status: DC
  Administered 2016-05-23: 30 mg via ORAL
  Filled 2016-05-23: qty 1

## 2016-05-23 MED ORDER — LORAZEPAM 2 MG/ML IJ SOLN: 1.0000 mg | Freq: Four times a day (QID) | INTRAMUSCULAR | Status: DC | PRN

## 2016-05-23 MED ORDER — FERROUS SULFATE 325 (65 FE) MG PO TABS
325.0000 mg | ORAL_TABLET | Freq: Every day | ORAL | Status: DC
  Administered 2016-05-23: 325 mg via ORAL
  Filled 2016-05-23: qty 1

## 2016-05-23 MED ORDER — LORAZEPAM 1 MG PO TABS: 1.0000 mg | ORAL_TABLET | Freq: Four times a day (QID) | ORAL | Status: DC | PRN

## 2016-05-23 MED ORDER — ATORVASTATIN CALCIUM 40 MG PO TABS: 40.0000 mg | ORAL_TABLET | Freq: Every day | ORAL | Status: DC

## 2016-05-23 MED ORDER — VITAMIN B-1 100 MG PO TABS
100.0000 mg | ORAL_TABLET | Freq: Every day | ORAL | Status: DC
  Administered 2016-05-23: 100 mg via ORAL
  Filled 2016-05-23: qty 1

## 2016-05-23 MED ORDER — AMOXICILLIN-POT CLAVULANATE 875-125 MG PO TABS
1.0000 | ORAL_TABLET | Freq: Two times a day (BID) | ORAL | Status: DC
  Administered 2016-05-23: 1 via ORAL
  Filled 2016-05-23: qty 1
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
## 2016-05-23 NOTE — Progress Notes (Addendum)
Stopped by to see [name] to discuss his heart failure, drinking and how this all affects his health.  He told me that his heart "doesn't work very well" and that he has heart failure and dementia.  I asked him if he understands what will happen if he continues drinking when he goes home and he stated that "I will probably die".  When asked how long he has to live he said "well, I hope another 10 years".  I asked if it would change his mind if I told him that if he continues to drink with his advanced heart failure he could have 1-2 years.  He said he wants to stop drinking and get involved with AA.  He and his wife used to be quite involved years back.  He feels optimistic that he can quit drinking. Hwe  I have scheduled a follow up appointment for this patient and also HHPT/OT and RN services.

## 2016-05-23 NOTE — Discharge Summary (Signed)
[facility] Discharge Summary  Patient name: [name] Medical record number: [id number] Date of birth: 09-Dec-1935 Age: 81 y.o. Gender: male Date of Admission: 05/22/2016  Date of Discharge: 05/23/2016 Admitting Physician: Kinnie Feil, MD  Primary Care Provider: MCDIARMID,TODD D, MD Consultants: None  Indication for Hospitalization: Weakness and alcohol intoxication  Discharge Diagnoses/Problem List:  Airspace infiltrate Weakness Congestive heart failure Alcohol abuse  Disposition: Discharge home  Discharge Condition: Stable, improved  Discharge Exam:  See H&P by Dr. Fredonia Highland on 05/23/2016  Brief Hospital Course:  Patient arrived to [facility] emergency department via EMS after he was reportedly intoxicated at home and his wife was concerned about him. Recently left A[facility] from [facility] and noted that he has been drinking a lot of alcohol since he left. Was initially appearing somewhat somnolent and endorsed generalized weakness. CT head showed no acute intracranial pathology and age-related atrophy and chronic microvascular ischemic changes. Chest x-ray showed questionable infiltrate and due to chronic cough and recent stay at SNF he was started on Zosyn in the ED. Clinical exam did not correlate with this finding and antibiotics were discontinued.  Did not score on CIWA during admission. Seen by PT and OT who both recommended home health care.   Discussed the seriousness of patient's heart condition and his alcohol abuse and the effect this will have on his health. He understood that he has severe heart failure and that his alcohol abuse will likely kill him. I felt that the patient has capacity to make his own decisions. Discussed this with his niece Tanna Furry, who was working on paperwork to become his healthcare power of attorney and she agreed with discharge home.  Prior to discharge she agreed to home health care. The time of  discharge she was not scoring and see why and his vital signs were stable and was asymptomatic.   Issues for Follow Up:  1. Airspace infiltrate: Follow up CXR in 6 weeks to ensure findings on CXR have resolved. 2. Generalized weakness: Likely related to his chronic alcoholism and advanced heart failure. Will receive home health PT and OT. 3. Alcohol abuse: Patient states that he would like to quit drinking and plans to start Alcoholics Anonymous with his wife  Significant Procedures: none  Significant Labs and Imaging:   Recent Labs Lab 05/22/16 2228 05/22/16 2255 05/23/16 0444  WBC 5.0  --  3.7*  HGB 8.9* 8.5* 8.3*  HCT 27.6* 25.0* 25.7*  PLT 165  --  152    Recent Labs Lab 05/22/16 2228 05/22/16 2255 05/23/16 0444 05/23/16 1054  NA 139 140 140  --   K 3.4* 3.4* 3.6  --   CL 107 103 107  --   CO2 23  --  24  --   GLUCOSE 82 78 75  --   BUN 15 17 14   --   CREATININE 0.83 1.10 0.91  --   CALCIUM 10.1  --  9.9  --   MG  --   --   --  1.3*  ALKPHOS 48  --   --   --   AST 18  --   --   --   ALT 16*  --   --   --   ALBUMIN 3.6  --   --   --    No results found.  Results/Tests Pending at Time of Discharge: none  Discharge Medications:  Allergies as of 05/23/2016  Reactions   Metoprolol Other (See Comments)   Sinus Pauses      Medication List    TAKE these medications   acetaminophen 650 MG CR tablet Commonly known as:  TYLENOL 8 HOUR Take 1 tablet (650 mg total) by mouth every 8 (eight) hours as needed for pain.   aspirin EC 81 MG tablet Take 1 tablet (81 mg total) by mouth daily.   atorvastatin 40 MG tablet Commonly known as:  LIPITOR TAKE 1 TABLET BY MOUTH EVERY DAY   CALCIUM 1000 + D PO Take one tablet by mouth once daily   carvedilol 6.25 MG tablet Commonly known as:  COREG Take 6.25 mg by mouth daily.   donepezil 10 MG tablet Commonly known as:  ARICEPT Take 10 mg by mouth at bedtime.   DULoxetine 30 MG capsule Commonly known as:   CYMBALTA Take 30 mg by mouth daily.   feeding supplement (PRO-STAT SUGAR FREE 64) Liqd Take 30 mLs by mouth 2 (two) times daily.   ferrous sulfate 325 (65 FE) MG tablet TAKE 1 TABLET BY MOUTH EVERY DAY WITH BREAKFAST   folic acid 1 MG tablet Commonly known as:  FOLVITE Take 1 mg by mouth daily.   furosemide 20 MG tablet Commonly known as:  LASIX Take 40 mg by mouth daily.   lisinopril 5 MG tablet Commonly known as:  PRINIVIL,ZESTRIL Take 1 tablet (5 mg total) by mouth at bedtime.   magnesium chloride 64 MG Tbec SR tablet Commonly known as:  SLOW-MAG Take 3 tablets by mouth daily.   multivitamin with minerals Tabs tablet Take 1 tablet by mouth daily.   NITROSTAT 0.4 MG SL tablet Generic drug:  nitroGLYCERIN PLACE 1 TABLET UNDER THE TONGUE EVERY 5 MINUTES AS NEEDED FOR CHEST PAIN AS DIRECTED   psyllium 58.6 % powder Commonly known as:  METAMUCIL Take 1 packet by mouth daily.   thiamine 100 MG tablet Commonly known as:  VITAMIN B-1 Take 100 mg by mouth daily.   VITAMIN D (CHOLECALCIFEROL) PO Take 600 Int'l Units by mouth daily at 6 (six) AM.       Discharge Instructions: Please refer to Patient Instructions section of EMR for full details.  Patient was counseled important signs and symptoms that should prompt return to medical care, changes in medications, dietary instructions, activity restrictions, and follow up appointments.   Follow-Up Appointments: Follow-up Information    [facility].   Why:  They will do your home health care at your home Contact information: [address] [place] [address] [phone]           Kayleanna Lorman L Kirt Chew, MD 05/25/2016, 1:52 PM PGY-1, [place]

## 2016-05-23 NOTE — Evaluation (Signed)
Occupational Therapy Evaluation Patient Details Name: [name] MRN: [id number] DOB: 10-22-1935 Today's Date: 05/23/2016    History of Present Illness 81 year old male admitted 05/22/16 due to weakness and intoxication. PMH significant for CAD s/p CABG, afib, HTN, HLD, systolic heart failure, prostate CA, OA, depression, ETOH abuse, memory difficulty, increased falls at home. BSE requested due to CXR results indicating LLL opacity however now MD feels it is not PNA.    Clinical Impression   This 81 yo male admitted with above presents to acute OT with deficits below (see OT problem list) thus affecting his ability to totally care for his basic ADLs. He will benefit from acute OT with follow up [place] to work back to him being independent/Mod I with basic ADLs in his own environment.    Follow Up Recommendations  Home health OT;Supervision/Assistance - 24 hour    Equipment Recommendations  None recommended by OT       Precautions / Restrictions Precautions Precautions: Fall Restrictions Weight Bearing Restrictions: No      Mobility Bed Mobility Overal bed mobility: Modified Independent                Transfers Overall transfer level: Needs assistance Equipment used: Rolling walker (2 wheeled) Transfers: Sit to/from Stand Sit to Stand: Min guard         General transfer comment: pt is aware of use of hands to power up    Balance Overall balance assessment: Needs assistance Sitting-balance support: No upper extremity supported;Feet supported Sitting balance-Leahy Scale: Good     Standing balance support: Bilateral upper extremity supported;During functional activity Standing balance-Leahy Scale: Poor                              ADL Overall ADL's : Needs assistance/impaired Eating/Feeding: Independent;Sitting   Grooming: Min guard;Standing   Upper Body Bathing: Set up;Sitting   Lower Body Bathing: Min guard;Sit to/from stand   Upper Body  Dressing : Set up;Sitting   Lower Body Dressing: Min guard;Sit to/from stand   Toilet Transfer: Min guard;Ambulation;RW;BSC (over toilet)   Toileting- Clothing Manipulation and Hygiene: Min guard;Sit to/from stand               Vision Patient Visual Report: No change from baseline        Pertinent Vitals/Pain Pain Assessment: No/denies pain     Hand Dominance Right   Extremity/Trunk Assessment Upper Extremity Assessment Upper Extremity Assessment: Generalized weakness     Communication Communication Communication: No difficulties   Cognition Arousal/Alertness: Awake/alert Behavior During Therapy: WFL for tasks assessed/performed Overall Cognitive Status: History of cognitive impairments - at baseline (per chart)                 General Comments: Pt washed his hands once with me and then had to go to the bathroom. Upon exiting the bathroom he went to the sink and could not find the soap that he had just used only minutes before.  He also said that when he was here in the hospital last he had his eyes checked and was suppose to get glasses even though they told me I did not need them.              Home Living Family/patient expects to be discharged to:: Private residence Living Arrangements: Spouse/significant other Available Help at Discharge: Family;Available 24 hours/day Type of Home: House Home Access: Stairs to enter [employer]  of Steps: 1 Entrance Stairs-Rails: None Home Layout: Laundry or work area in basement;One level (states he doesn't need to go down there)     Bathroom Shower/Tub: Tub/shower unit (but pt states he just sponge bathes)   Bathroom Toilet: Standard     Home Equipment: Cane - single point;Bedside commode         Prior Functioning/Environment Level of Independence: Independent with assistive device(s)        Comments: cane used for ambulation, friends drive him to the grocery store        OT Problem List:  Impaired balance (sitting and/or standing)      OT Treatment/Interventions: Self-care/ADL training;Balance training;Patient/family education    OT Goals(Current goals can be found in the care plan section) Acute Rehab OT Goals Patient Stated Goal: to do as much for my self so I do not have to rely on "my common law wife" OT Goal Formulation: With patient Time For Goal Achievement: 2016/06/15 Potential to Achieve Goals: Good  OT Frequency: Min 2X/week              End of Session Equipment Utilized During Treatment: Rolling walker  Activity Tolerance: Patient tolerated treatment well Patient left: in bed;with call bell/phone within reach;with chair alarm set  OT Visit Diagnosis: Unsteadiness on feet (R26.81)                ADL either performed or assessed with clinical judgement  Time: [id number] OT Time Calculation (min): 40 min Charges:  OT General Charges $OT Visit: 1 Procedure OT Evaluation $OT Eval Moderate Complexity: 1 Procedure OT Treatments $Self Care/Home Management : 23-37 mins [place], OTR/L [id number] 05/23/2016  Almon Register 05/23/2016, 12:02 PM

## 2016-05-23 NOTE — Care Management Note (Addendum)
Case Management Note  Patient Details  Name: [name] MRN: [id number] Date of Birth: 12-25-1935  Subjective/Objective:      Admitted with Pneumonia              Action/Plan: Patient lives at home with his girlfriend [name]; has private insurance with [facility] with prescription drug coverage; patient would benefit from [facility] services but has refused; stated " all I need to do is stop drinking and I will be fine." CM talked to the patient about the benefits of [place] but patient refused again.   1536- CM talked to patient again about [place] with his Attending MD on speaker phone ; patient is now agreeable to [facility] services; [place] choice offered, pt chose [facility]; Butch Penny with [facility] called for arrangements.   1540- Received call from Butch Penny, they cannot provide a HHRN as ordered. HHC offered to patient again, pt chose [facility]; referral placed, they can accept the patient.  Expected Discharge Date:    05/23/2016              Expected Discharge Plan:  [place]   Discharge planning Services  CM Consult   HH Arranged:  PT/OT/[facility]    Status of Service:  In process, will continue to follow  Sherrilyn Rist [id number] 05/23/2016, 3:22 PM

## 2016-05-23 NOTE — ED Notes (Signed)
MD at bedside. 
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
## 2016-05-23 NOTE — ED Notes (Signed)
Pt heard screaming in room. Pt stated that he thought that everyone had left him there. Pt reoriented.

## 2016-05-23 NOTE — Progress Notes (Addendum)
Stopped by to see [name]. He denies CP, SOB, fevers, chills or any discomfort.  He appears comfortable on room air and lungs were clear with good air movement and is not fluid overloaded. Despite cxr finding and recent admission to SNF, he looks well clinically and probably not PNA.  PT/OT orders in to assess general deconditioning.  Scoring 0 on CIWA this AM.  Last drink last night.  History of low mag, will follow up.   Daniel L. Rosalyn Gess, [place] Resident PGY-1 05/23/2016 9:11 AM

## 2016-05-23 NOTE — Care Management Obs Status (Signed)
[place] NOTIFICATION   Patient Details  Name: [name] MRN: [id number] Date of Birth: 1935/06/16   Medicare Observation Status Notification Given:  Yes    Carles Collet, RN 05/23/2016, 3:41 PM

## 2016-05-23 NOTE — Progress Notes (Signed)
   05/23/16 0345  Vitals  Temp 98.2 F (36.8 C)  Temp Source Oral  BP (!) 128/49  BP Location Right Arm  BP Method Automatic  Patient Position (if appropriate) Lying  Pulse Rate 62  Pulse Rate Source Dinamap  Resp 14  Oxygen Therapy  SpO2 95 %  O2 Device Room Air  Height and Weight  Height 5\' 11"  (1.803 m)  Weight 74.9 kg (165 lb 1.6 oz)  Type of Scale Used Standing  BSA (Calculated - sq m) 1.94 sq meters  BMI (Calculated) 23.1  Weight in (lb) to have BMI = 25 178.9  Admitted pt to rm 3E14 from ED, pt alert and oriented, denied pain at this time, oriented to room, call bell placed within reach, placed on cardiac monitor, CCMD made aware. Admission assessment done, orders carried out.

## 2016-05-23 NOTE — Evaluation (Addendum)
Physical Therapy Evaluation Patient Details Name: [name] MRN: [id number] DOB: 03/15/1936 Today's Date: 05/23/2016   History of Present Illness  81 yo male with onset of PNA and noted to have clear CT of head, has EF 50-55% last testing.  PMHx:  CAD, EtOH use, HTN, CHF, OA, CABG, a-fib, stroke, PACER  Clinical Impression  Pt is going on walker in his room, not feeling like a longer walk but is very capable with RW.  His PLOF was SPC so will focus acutely on strengthening and progression of gait with a lesser device.  He is very agreeable and motivated to get home, and will need to use support of family and friends to successfully get there and HHPT to progress rehab.    Follow Up Recommendations Home health PT;Supervision - Intermittent    Equipment Recommendations  Rolling walker with 5" wheels    Recommendations for Other Services       Precautions / Restrictions Precautions Precautions: Fall Restrictions Weight Bearing Restrictions: No      Mobility  Bed Mobility Overal bed mobility: Modified Independent                Transfers Overall transfer level: Modified independent Equipment used: Rolling walker (2 wheeled)             General transfer comment: pt is aware of use of hands to power up  Ambulation/Gait Ambulation/Gait assistance: Min guard Ambulation Distance (Feet): 45 Feet Assistive device: Rolling walker (2 wheeled);1 person hand held assist Gait Pattern/deviations: Step-through pattern;Wide base of support;Trunk flexed;Decreased stride length Gait velocity: reduced Gait velocity interpretation: Below normal speed for age/gender General Gait Details: has some adjustments in gait but safe on walker with supervised help  Stairs            Wheelchair Mobility    Modified Rankin (Stroke Patients Only)       Balance Overall balance assessment: Needs assistance Sitting-balance support: Feet supported Sitting balance-Leahy Scale:  Good     Standing balance support: Bilateral upper extremity supported Standing balance-Leahy Scale: Fair                               Pertinent Vitals/Pain Pain Assessment: No/denies pain    Home Living Family/patient expects to be discharged to:: Private residence Living Arrangements: Alone Available Help at Discharge: Friend(s);Available PRN/intermittently Type of Home: House Home Access: Stairs to enter Entrance Stairs-Rails: None Entrance Stairs-Number of Steps: 1 Home Layout: Laundry or work area in basement;One level (states he doesn't need to go down there) Home Equipment: Cane - single point Additional Comments: has space to set up a walker on step if needed    Prior Function Level of Independence: Independent with assistive device(s)         Comments: cane used for ambulation, friends drive him to the grocery store     Hand Dominance   Dominant Hand: Right    Extremity/Trunk Assessment   Upper Extremity Assessment Upper Extremity Assessment: Overall WFL for tasks assessed    Lower Extremity Assessment Lower Extremity Assessment: Overall WFL for tasks assessed    Cervical / Trunk Assessment Cervical / Trunk Assessment: Normal  Communication   Communication: No difficulties  Cognition Arousal/Alertness: Awake/alert Behavior During Therapy: WFL for tasks assessed/performed Overall Cognitive Status: Within Functional Limits for tasks assessed  General Comments      Exercises     Assessment/Plan    PT Assessment Patient needs continued PT services  PT Problem List Decreased range of motion;Decreased activity tolerance;Decreased balance;Decreased mobility;Decreased knowledge of use of DME;Decreased safety awareness (had good sats with effort >90% but not a walker user normall)       PT Treatment Interventions Gait training;DME instruction;Stair training;Functional mobility training;Therapeutic  activities;Therapeutic exercise;Balance training;Neuromuscular re-education;Patient/family education    PT Goals (Current goals can be found in the Care Plan section)  Acute Rehab PT Goals Patient Stated Goal: none stated  PT Goal Formulation: With patient Time For Goal Achievement: 06/06/16 Potential to Achieve Goals: Good    Frequency Min 3X/week   Barriers to discharge Decreased caregiver support home alone    Co-evaluation               End of Session Equipment Utilized During Treatment: Gait belt Activity Tolerance: Patient tolerated treatment well Patient left: in chair;with call bell/phone within reach;with chair alarm set Nurse Communication: Mobility status PT Visit Diagnosis: Unsteadiness on feet (R26.81)    Functional Assessment Tool Used: AM-PAC 6 Clicks Basic Mobility    Time: [id number] PT Time Calculation (min) (ACUTE ONLY): 23 min   Charges:   PT Evaluation $PT Eval Moderate Complexity: 1 Procedure PT Treatments $Gait Training: 8-22 mins   PT G Codes:   PT G-Codes **NOT FOR INPATIENT CLASS** Functional Assessment Tool Used: AM-PAC 6 Clicks Basic Mobility     Ramond Dial 05/23/2016, 10:15 AM   Mee Hives, PT MS Acute Rehab Dept. Number: [place] [facility] [place]

## 2016-05-23 NOTE — Discharge Instructions (Signed)
You were admitted for alcohol intoxication.  You are doing better at this point and we are discharging you with hopes of your ultimately quitting your drinking.  You have a follow up appointment on 06/03/16 at 11:30AM [facility].  I am also sending you home with orders for a physical therapist/occupational therapist and a home nurse.

## 2016-05-23 NOTE — Care Management CC44 (Signed)
Condition Code 44 Documentation Completed  Patient Details  Name: DEVORIS WIESE MRN: [id number] Date of Birth: [birth year]   Condition Code 44 given:  Yes Patient signature on Condition Code 44 notice:  Yes Documentation of 2 MD's agreement:  Yes Code 44 added to claim:  Yes    Carles Collet, RN 05/23/2016, 3:41 PM

## 2016-05-28 LAB — CULTURE, BLOOD (ROUTINE X 2)
CULTURE: NO GROWTH
CULTURE: NO GROWTH

## 2016-05-29 ENCOUNTER — Emergency Department (HOSPITAL_COMMUNITY): Payer: Medicare Other

## 2016-05-29 ENCOUNTER — Encounter (HOSPITAL_COMMUNITY): Admission: EM | Disposition: E | Payer: Self-pay | Source: Home / Self Care | Attending: Interventional Cardiology

## 2016-05-29 ENCOUNTER — Inpatient Hospital Stay (HOSPITAL_COMMUNITY)
Admission: EM | Admit: 2016-05-29 | Discharge: 2016-06-30 | DRG: 302 | Disposition: E | Payer: Medicare Other | Attending: Interventional Cardiology | Admitting: Interventional Cardiology

## 2016-05-29 ENCOUNTER — Encounter (HOSPITAL_COMMUNITY): Payer: Self-pay

## 2016-05-29 DIAGNOSIS — M810 Age-related osteoporosis without current pathological fracture: Secondary | ICD-10-CM | POA: Diagnosis present

## 2016-05-29 DIAGNOSIS — G931 Anoxic brain damage, not elsewhere classified: Secondary | ICD-10-CM | POA: Diagnosis present

## 2016-05-29 DIAGNOSIS — I5043 Acute on chronic combined systolic (congestive) and diastolic (congestive) heart failure: Secondary | ICD-10-CM | POA: Diagnosis present

## 2016-05-29 DIAGNOSIS — I4891 Unspecified atrial fibrillation: Secondary | ICD-10-CM | POA: Diagnosis present

## 2016-05-29 DIAGNOSIS — F329 Major depressive disorder, single episode, unspecified: Secondary | ICD-10-CM | POA: Diagnosis present

## 2016-05-29 DIAGNOSIS — D638 Anemia in other chronic diseases classified elsewhere: Secondary | ICD-10-CM | POA: Diagnosis present

## 2016-05-29 DIAGNOSIS — I4901 Ventricular fibrillation: Secondary | ICD-10-CM | POA: Diagnosis not present

## 2016-05-29 DIAGNOSIS — R32 Unspecified urinary incontinence: Secondary | ICD-10-CM | POA: Diagnosis present

## 2016-05-29 DIAGNOSIS — Z87891 Personal history of nicotine dependence: Secondary | ICD-10-CM | POA: Diagnosis not present

## 2016-05-29 DIAGNOSIS — I255 Ischemic cardiomyopathy: Principal | ICD-10-CM | POA: Diagnosis present

## 2016-05-29 DIAGNOSIS — Z950 Presence of cardiac pacemaker: Secondary | ICD-10-CM

## 2016-05-29 DIAGNOSIS — Z8673 Personal history of transient ischemic attack (TIA), and cerebral infarction without residual deficits: Secondary | ICD-10-CM

## 2016-05-29 DIAGNOSIS — K760 Fatty (change of) liver, not elsewhere classified: Secondary | ICD-10-CM | POA: Diagnosis present

## 2016-05-29 DIAGNOSIS — I469 Cardiac arrest, cause unspecified: Secondary | ICD-10-CM

## 2016-05-29 DIAGNOSIS — Z66 Do not resuscitate: Secondary | ICD-10-CM | POA: Diagnosis present

## 2016-05-29 DIAGNOSIS — I081 Rheumatic disorders of both mitral and tricuspid valves: Secondary | ICD-10-CM | POA: Diagnosis present

## 2016-05-29 DIAGNOSIS — E7800 Pure hypercholesterolemia, unspecified: Secondary | ICD-10-CM | POA: Diagnosis present

## 2016-05-29 DIAGNOSIS — R627 Adult failure to thrive: Secondary | ICD-10-CM | POA: Diagnosis present

## 2016-05-29 DIAGNOSIS — K219 Gastro-esophageal reflux disease without esophagitis: Secondary | ICD-10-CM | POA: Diagnosis present

## 2016-05-29 DIAGNOSIS — Z515 Encounter for palliative care: Secondary | ICD-10-CM | POA: Diagnosis present

## 2016-05-29 DIAGNOSIS — I252 Old myocardial infarction: Secondary | ICD-10-CM | POA: Diagnosis not present

## 2016-05-29 DIAGNOSIS — I462 Cardiac arrest due to underlying cardiac condition: Secondary | ICD-10-CM | POA: Diagnosis present

## 2016-05-29 DIAGNOSIS — Z9181 History of falling: Secondary | ICD-10-CM

## 2016-05-29 DIAGNOSIS — Z8546 Personal history of malignant neoplasm of prostate: Secondary | ICD-10-CM

## 2016-05-29 DIAGNOSIS — I472 Ventricular tachycardia: Secondary | ICD-10-CM | POA: Diagnosis not present

## 2016-05-29 DIAGNOSIS — J9601 Acute respiratory failure with hypoxia: Secondary | ICD-10-CM

## 2016-05-29 DIAGNOSIS — Z951 Presence of aortocoronary bypass graft: Secondary | ICD-10-CM

## 2016-05-29 DIAGNOSIS — F0390 Unspecified dementia without behavioral disturbance: Secondary | ICD-10-CM | POA: Diagnosis present

## 2016-05-29 DIAGNOSIS — I110 Hypertensive heart disease with heart failure: Secondary | ICD-10-CM | POA: Diagnosis present

## 2016-05-29 DIAGNOSIS — E781 Pure hyperglyceridemia: Secondary | ICD-10-CM | POA: Diagnosis present

## 2016-05-29 DIAGNOSIS — I2510 Atherosclerotic heart disease of native coronary artery without angina pectoris: Secondary | ICD-10-CM | POA: Diagnosis present

## 2016-05-29 LAB — I-STAT ARTERIAL BLOOD GAS, ED
ACID-BASE DEFICIT: 2 mmol/L (ref 0.0–2.0)
BICARBONATE: 22.6 mmol/L (ref 20.0–28.0)
O2 SAT: 100 %
PH ART: 7.393 (ref 7.350–7.450)
TCO2: 24 mmol/L (ref 0–100)
pCO2 arterial: 37 mmHg (ref 32.0–48.0)
pO2, Arterial: 387 mmHg — ABNORMAL HIGH (ref 83.0–108.0)

## 2016-05-29 LAB — COMPREHENSIVE METABOLIC PANEL
ALBUMIN: 3.7 g/dL (ref 3.5–5.0)
ALK PHOS: 87 U/L (ref 38–126)
ALT: 27 U/L (ref 17–63)
ANION GAP: 17 — AB (ref 5–15)
AST: 54 U/L — ABNORMAL HIGH (ref 15–41)
BILIRUBIN TOTAL: 1.3 mg/dL — AB (ref 0.3–1.2)
BUN: 14 mg/dL (ref 6–20)
CALCIUM: 9.1 mg/dL (ref 8.9–10.3)
CO2: 18 mmol/L — ABNORMAL LOW (ref 22–32)
Chloride: 101 mmol/L (ref 101–111)
Creatinine, Ser: 1.4 mg/dL — ABNORMAL HIGH (ref 0.61–1.24)
GFR, EST AFRICAN AMERICAN: 54 mL/min — AB (ref >60)
GFR, EST NON AFRICAN AMERICAN: 47 mL/min — AB (ref >60)
Glucose, Bld: 183 mg/dL — ABNORMAL HIGH (ref 65–99)
POTASSIUM: 3.9 mmol/L (ref 3.5–5.1)
Sodium: 136 mmol/L (ref 135–145)
TOTAL PROTEIN: 6.7 g/dL (ref 6.5–8.1)

## 2016-05-29 LAB — CBC WITH DIFFERENTIAL/PLATELET
Basophils Absolute: 0 10*3/uL (ref 0.0–0.1)
Basophils Relative: 0 %
Eosinophils Absolute: 0.2 10*3/uL (ref 0.0–0.7)
Eosinophils Relative: 2 %
HEMATOCRIT: 32.6 % — AB (ref 39.0–52.0)
HEMOGLOBIN: 10.3 g/dL — AB (ref 13.0–17.0)
LYMPHS ABS: 4.9 10*3/uL — AB (ref 0.7–4.0)
LYMPHS PCT: 54 %
MCH: 29.5 pg (ref 26.0–34.0)
MCHC: 31.6 g/dL (ref 30.0–36.0)
MCV: 93.4 fL (ref 78.0–100.0)
MONO ABS: 0.4 10*3/uL (ref 0.1–1.0)
MONOS PCT: 4 %
NEUTROS ABS: 3.6 10*3/uL (ref 1.7–7.7)
NEUTROS PCT: 40 %
Platelets: 201 10*3/uL (ref 150–400)
RBC: 3.49 MIL/uL — ABNORMAL LOW (ref 4.22–5.81)
RDW: 16 % — AB (ref 11.5–15.5)
WBC: 9 10*3/uL (ref 4.0–10.5)

## 2016-05-29 LAB — I-STAT CHEM 8, ED
BUN: 17 mg/dL (ref 6–20)
CHLORIDE: 103 mmol/L (ref 101–111)
CREATININE: 1.2 mg/dL (ref 0.61–1.24)
Calcium, Ion: 1.17 mmol/L (ref 1.15–1.40)
Glucose, Bld: 180 mg/dL — ABNORMAL HIGH (ref 65–99)
HEMATOCRIT: 31 % — AB (ref 39.0–52.0)
Hemoglobin: 10.5 g/dL — ABNORMAL LOW (ref 13.0–17.0)
Potassium: 3.9 mmol/L (ref 3.5–5.1)
Sodium: 139 mmol/L (ref 135–145)
TCO2: 21 mmol/L (ref 0–100)

## 2016-05-29 LAB — I-STAT TROPONIN, ED: TROPONIN I, POC: 0.22 ng/mL — AB (ref 0.00–0.08)

## 2016-05-29 LAB — PHOSPHORUS: Phosphorus: 5.4 mg/dL — ABNORMAL HIGH (ref 2.5–4.6)

## 2016-05-29 LAB — MAGNESIUM: MAGNESIUM: 1.7 mg/dL (ref 1.7–2.4)

## 2016-05-29 SURGERY — LEFT HEART CATH AND CORONARY ANGIOGRAPHY
Anesthesia: LOCAL

## 2016-05-29 MED ORDER — ATROPINE SULFATE 1 % OP SOLN
4.0000 [drp] | OPHTHALMIC | Status: DC | PRN
  Administered 2016-05-30: 4 [drp] via SUBLINGUAL
  Filled 2016-05-29: qty 2

## 2016-05-29 MED ORDER — SUCCINYLCHOLINE CHLORIDE 20 MG/ML IJ SOLN
INTRAMUSCULAR | Status: DC | PRN
  Administered 2016-05-29: 120 mg via INTRAVENOUS

## 2016-05-29 MED ORDER — SODIUM CHLORIDE 0.9 % IV SOLN
25.0000 ug/h | INTRAVENOUS | Status: DC
  Filled 2016-05-29: qty 50

## 2016-05-29 MED ORDER — FENTANYL CITRATE (PF) 100 MCG/2ML IJ SOLN
50.0000 ug | Freq: Once | INTRAMUSCULAR | Status: AC
  Administered 2016-05-29: 50 ug via INTRAVENOUS
  Filled 2016-05-29: qty 2

## 2016-05-29 MED ORDER — FENTANYL BOLUS VIA INFUSION
25.0000 ug | INTRAVENOUS | Status: DC | PRN
  Administered 2016-05-29 – 2016-05-30 (×2): 25 ug via INTRAVENOUS
  Filled 2016-05-29: qty 25

## 2016-05-29 MED ORDER — FENTANYL BOLUS VIA INFUSION
25.0000 ug | INTRAVENOUS | Status: DC | PRN
  Filled 2016-05-29: qty 25

## 2016-05-29 MED ORDER — SODIUM CHLORIDE 0.9 % IV SOLN
0.0000 mg/h | INTRAVENOUS | Status: DC
  Filled 2016-05-29: qty 10

## 2016-05-29 MED ORDER — SODIUM CHLORIDE 0.9 % IV SOLN
0.5000 mg/h | INTRAVENOUS | Status: DC
  Administered 2016-05-29: 0.5 mg/h via INTRAVENOUS
  Filled 2016-05-29: qty 10

## 2016-05-29 MED ORDER — MIDAZOLAM BOLUS VIA INFUSION
1.0000 mg | INTRAVENOUS | Status: DC | PRN
  Administered 2016-05-30: 2 mg via INTRAVENOUS
  Filled 2016-05-29: qty 2

## 2016-05-29 MED ORDER — ACETAMINOPHEN 650 MG RE SUPP: 650.0000 mg | RECTAL | Status: DC | PRN

## 2016-05-29 MED ORDER — SODIUM CHLORIDE 0.9 % IV SOLN
25.0000 ug/h | INTRAVENOUS | Status: DC
  Administered 2016-05-29: 50 ug/h via INTRAVENOUS
  Filled 2016-05-29: qty 50

## 2016-05-29 MED ORDER — ETOMIDATE 2 MG/ML IV SOLN
INTRAVENOUS | Status: DC | PRN
  Administered 2016-05-29: 20 mg via INTRAVENOUS

## 2016-05-29 NOTE — Code Documentation (Addendum)
Pt comes via [facility], found by family after about a possible 10 minute down time , went into PEA had CPR for 3-4 minutes shocked x 1, then SB and paced for 3-4 minutes , restarted CPR before ROSC with one epi, then ST with BBB possible STEMI, PTA received 2 mg of narcan, now on epi infusion.

## 2016-05-29 NOTE — Progress Notes (Signed)
Patient transported from ED trauma A to 4N 17 with no complications.
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
## 2016-05-29 NOTE — Consult Note (Signed)
PCCM Consult Note  Referring provider: Dr. Tamala Julian, Cardiology CC: Cardiac arrest  HPI: 81 yo male brought to ER after cardiac arrest.  He was intubated during resuscitative efforts.  He has hx of systolic CHF with EF 123456, A fib, ETOH.  Upon arrival by family it was confirmed that he had DNR status.  Family did not want to pursue any aggressive measures, and plan would be to gather remainder of family/friends to proceed with extubation and comfort measures on 06-16-2016.  PCCM asked to assist with this plan.  He  has a past medical history of Abdominal pain, acute, bilateral lower quadrant (04/19/2016); ACC/AHA stage D systolic heart failure ([place]) (04/15/2016); Alcohol abuse (06/11/2006); Alcohol dependence ([place]) (10/25/2012); Alcohol dependence in controlled environment ([place]) (04/19/2016); Anemia; Anemia, chronic disease (02/21/2013); Anemia, iron deficiency (06/02/2013); At high risk for falls (04/24/2010); Atrial fibrillation ([place]) (12/14/2009); Atrial fibrillation with rapid ventricular response ([place]) (04/2009); Bradycardia, sinus (07/2007); Cardiac pacemaker in situ (04/19/2016); Cerebellar stroke ([place]) (03/06/2016); Cerebral atrophy (10/2005); Chronic diastolic heart failure ([place]) (12/12/2009); Chronic low back pain (05/29/2006); Chronic lower back pain; Chronic systolic heart failure ([place]); CONSTIPATION, CHRONIC (04/20/2009); Coronary artery disease; Coronary artery disease involving coronary bypass graft of native heart without angina pectoris (04/11/2016); Coronary artery disease involving native coronary artery of native heart without angina pectoris; Dementia (03/06/2016); Depression; Echocardiogram abnormal (2011); Edentulous (04/19/2016); Encephalomalacia with cerebral infarction ([place]) (03/06/2016); ERECTILE DYSFUNCTION, ORGANIC (06/11/2006); Fracture of spinous process of cervical vertebra ([place]); Generalized weakness (09/10/2011); GERD (gastroesophageal reflux disease); Hepatic steatosis (12/25/2015); High cholesterol;  Hypertension; HYPERTENSION, BENIGN SYSTEMIC (05/29/2006); HYPERTRIGLYCERIDEMIA (05/29/2006); HYPERTROPHY PROSTATE BNG W/URINARY OBST/LUTS (06/11/2006); Hypoalbuminemia (01/09/2016); Hypocalcemia (01/09/2016); Hyponatremia (09/13/2012); Hypotension due to drugs (04/11/2016); INGUINAL HERNIA, RIGHT (05/05/2008); Laceration (08/02/2013); LBBB (left bundle branch block) (04/26/2009); Living in nursing home (03/06/2016); Low back pain; MYELOPATHY OTHER DISEASES CLASSIFIED ELSEWHERE (08/27/2007); Myocardial infarction [id number]); Nutritional anemia, unspecified (04/24/2010); Osteoarthritis of spine (03/2005); Osteoporosis (03/06/2016); Pancreatitis, alcoholic; PROSTATE CANCER, HX OF (05/29/2006); Prostate carcinoma ([place]) (06/2004); Reflux esophagitis (05/29/2006); RHINITIS, ALLERGIC (05/29/2006); Secondary cardiomyopathy ([place]) (01/02/2016); Syncopal episodes (07/30/2013); Syncope (10/2005); Unstable angina ([place]) (04/07/2014); Unsteadiness on feet (07/30/2013); UTI (lower urinary tract infection) (12/24/2015); and Vertebral compression fracture ([place]) (04/07/2014).  He  has a past surgical history that includes ORIF metatarsal fracture; Cataract extraction (Right, 06/13/2004); Prostate biopsy (07/13/2004); Cataract extraction (Left, 08/2007); Insertion prostate radiation seed (09/2009); Fracture surgery; Coronary artery bypass graft (1985); Coronary artery bypass graft (1997); Cardiac catheterization (07/2007); and left and right heart catheterization with coronary/graft angiogram (N/A, 04/08/2014).  His family history includes COPD in his mother; Hodgkin's lymphoma in his father and sister.  He  reports that he quit smoking about 28 years ago. His smoking use included Cigarettes. He has a 35.00 pack-year smoking history. He has never used smokeless tobacco. He reports that he drinks about 12.6 oz of alcohol per week . He reports that he does not use drugs.   Allergies  Allergen Reactions  . Metoprolol Other (See Comments)    Sinus Pauses     No current facility-administered medications on file prior to encounter.    Current Outpatient Prescriptions on File Prior to Encounter  Medication Sig  . acetaminophen (TYLENOL 8 HOUR) 650 MG CR tablet Take 1 tablet (650 mg total) by mouth every 8 (eight) hours as needed for pain.  . Amino Acids-Protein Hydrolys (FEEDING SUPPLEMENT, PRO-STAT SUGAR FREE 64,) LIQD Take 30 mLs by mouth 2 (two) times daily.  Marland Kitchen aspirin EC 81 MG tablet Take 1 tablet (81 mg total) by  mouth daily.  Marland Kitchen atorvastatin (LIPITOR) 40 MG tablet TAKE 1 TABLET BY MOUTH EVERY DAY  . Calcium Carb-Cholecalciferol (CALCIUM 1000 + D PO) Take one tablet by mouth once daily  . carvedilol (COREG) 6.25 MG tablet Take 6.25 mg by mouth daily.   Marland Kitchen donepezil (ARICEPT) 10 MG tablet Take 10 mg by mouth at bedtime.  . DULoxetine (CYMBALTA) 30 MG capsule Take 30 mg by mouth daily.  . ferrous sulfate 325 (65 FE) MG tablet TAKE 1 TABLET BY MOUTH EVERY DAY WITH BREAKFAST  . folic acid (FOLVITE) 1 MG tablet Take 1 mg by mouth daily.  . furosemide (LASIX) 20 MG tablet Take 40 mg by mouth daily.   Marland Kitchen lisinopril (PRINIVIL,ZESTRIL) 5 MG tablet Take 1 tablet (5 mg total) by mouth at bedtime.  . magnesium chloride (SLOW-MAG) 64 MG TBEC SR tablet Take 3 tablets by mouth daily.  . Multiple Vitamin (MULTIVITAMIN WITH MINERALS) TABS tablet Take 1 tablet by mouth daily.  Marland Kitchen NITROSTAT 0.4 MG SL tablet PLACE 1 TABLET UNDER THE TONGUE EVERY 5 MINUTES AS NEEDED FOR CHEST PAIN AS DIRECTED (Patient not taking: Reported on 04/19/2016)  . psyllium (METAMUCIL) 58.6 % powder Take 1 packet by mouth daily.  Marland Kitchen thiamine (VITAMIN B-1) 100 MG tablet Take 100 mg by mouth daily.  Marland Kitchen VITAMIN D, CHOLECALCIFEROL, PO Take 600 Int'l Units by mouth daily at 6 (six) AM.    ROS: Unable to obtain  General: thin Neuro: RASS -4 Eyes: pupils reactive ENT: ETT in place Cardiac: irregular, no murmur Chest: b/l crackles Abd: soft, non tender Ext: no edema Skin: no  rashes   CMP Latest Ref Rng & Units 05/18/2016 05/26/2016 05/23/2016  Glucose 65 - 99 mg/dL 183(H) 180(H) 75  BUN 6 - 20 mg/dL 14 17 14   Creatinine 0.61 - 1.24 mg/dL 1.40(H) 1.20 0.91  Sodium 135 - 145 mmol/L 136 139 140  Potassium 3.5 - 5.1 mmol/L 3.9 3.9 3.6  Chloride 101 - 111 mmol/L 101 103 107  CO2 22 - 32 mmol/L 18(L) - 24  Calcium 8.9 - 10.3 mg/dL 9.1 - 9.9  Total Protein 6.5 - 8.1 g/dL 6.7 - -  Total Bilirubin 0.3 - 1.2 mg/dL 1.3(H) - -  Alkaline Phos 38 - 126 U/L 87 - -  AST 15 - 41 U/L 54(H) - -  ALT 17 - 63 U/L 27 - -    CBC Latest Ref Rng & Units 05/25/2016 05/05/2016 05/23/2016  WBC 4.0 - 10.5 K/uL 9.0 - 3.7(L)  Hemoglobin 13.0 - 17.0 g/dL 10.3(L) 10.5(L) 8.3(L)  Hematocrit 39.0 - 52.0 % 32.6(L) 31.0(L) 25.7(L)  Platelets 150 - 400 K/uL 201 - 152    ABG    Component Value Date/Time   PHART 7.393 05/03/2016 2205   PCO2ART 37.0 05/04/2016 2205   PO2ART 387.0 (H) 05/08/2016 2205   HCO3 22.6 05/21/2016 2205   TCO2 24 05/17/2016 2205   ACIDBASEDEF 2.0 05/17/2016 2205   O2SAT 100.0 05/27/2016 2205    Dg Chest Portable 1 View  Result Date: 05/26/2016 CLINICAL DATA:  Endotracheal tube placement.  Initial encounter. EXAM: PORTABLE CHEST 1 VIEW COMPARISON:  Chest radiograph from 05/22/2016 FINDINGS: The patient's endotracheal tube is seen ending 5 cm above the carina. An enteric tube is noted extending below the diaphragm. The lungs are well-aerated. Mild retrocardiac opacity again raises concern for mild pneumonia, improved from the prior study. There is no evidence of pleural effusion or pneumothorax. The cardiomediastinal silhouette is mildly enlarged. The patient is  status post median sternotomy, with evidence of prior CABG. No acute osseous abnormalities are seen. IMPRESSION: 1. Endotracheal tube seen ending 5 cm above the carina. 2. Mild retrocardiac opacity again raises concern for mild pneumonia, improved from the prior study. 3. Mild cardiomegaly. Electronically Signed    By: Garald Balding M.D.   On: 05/22/2016 21:52    Assessment: Symptomatic bradycardia PEA cardiac arrest Acute on chronic systolic CHF Hx of atrial fibrillation Acute hypoxic respiratory failure Acute anoxic encephalopathy Hx of prostate cancer Hx of ETOH with pancreatitis Hx of HTN Hx of CVA Hx of dementia  Plan: DNR Versed/fentanyl gtt's for comfort Tylenol prn for fever Atropine sublingual gtt's prn for respiratory secretions Continue vent support overnight Tentative plan to proceed with extubation on 3/01  Chesley Mires, MD [place] 05/19/2016, 11:04 PM Pager:  [phone] After 3pm call: [phone]

## 2016-05-29 NOTE — ED Notes (Signed)
Interventional Cardiology  81 yo with ischemic CM, depression, dementia, ETOH use, LBBB, prostate cancer, chronic systolic HF with EF< 123456, atrial fibrillation, and recent low blood pressure and weakness.  Found down at home. Wife/male friend called EMS and CPR started. VF and PES noted. Total down time before stable ROSC 15-20 minutes. STEMI activation started,  In ER, intubated, review of record suggests DNR 04/19/16. Discussed with HCPOA and confirmed DNR. Invasive strategy for possible anterior STEMI declined.  Plan cancel STEMI. Admit to unit. CCM to assist with care. Likely comfort care and extubation.

## 2016-05-29 NOTE — Progress Notes (Signed)
   05/28/2016 2100  Clinical Encounter Type  Visited With Patient and family together;Health care provider  Visit Type Code;Critical Care  Referral From Nurse;Physician  Consult/Referral To Chaplain  Spiritual Encounters  Spiritual Needs Sacred text;Prayer;Emotional  Stress Factors  Patient Stress Factors None identified  Family Stress Factors Family relationships;Lack of knowledge    Chaplain paged for post CPR to Trauma A. Provided emotional support and ministry of hospitality to family of pt. Pt will be transitioning to comfort care and to critical care unit. Shandy Checo L. Volanda Napoleon, MDiv

## 2016-05-30 DIAGNOSIS — I255 Ischemic cardiomyopathy: Principal | ICD-10-CM

## 2016-05-30 DEATH — deceased

## 2016-05-31 NOTE — Progress Notes (Signed)
   05/23/16 1205  OT G-codes **NOT FOR INPATIENT CLASS**  Functional Assessment Tool Used Clinical judgement  Functional Limitation Self care  Self Care Current Status [phone]) CI  Self Care Goal Status [id number]) CI   Late entry [place], OTR/L [id number] 05/31/2016

## 2016-06-03 ENCOUNTER — Inpatient Hospital Stay: Payer: Self-pay | Admitting: Family Medicine

## 2016-06-04 ENCOUNTER — Telehealth: Payer: Self-pay | Admitting: Interventional Cardiology

## 2016-06-04 NOTE — Telephone Encounter (Signed)
Original d/c received at [facility] office for Dr.Henry Tamala Julian to sign. Received from [facility]. Have to [facility]

## 2016-06-05 ENCOUNTER — Telehealth: Payer: Self-pay | Admitting: Interventional Cardiology

## 2016-06-05 ENCOUNTER — Telehealth: Payer: Self-pay

## 2016-06-05 NOTE — Telephone Encounter (Signed)
[facility] calling to check on d/c of patient. I have made her aware that Coqui will be signing and a copy of the d/c has been faxed to [place] at [place] and original sent interoffice.  Please fax d/c when completed to [phone].

## 2016-06-05 NOTE — Telephone Encounter (Signed)
Per Dr.Smith d/c needs to be signed by Dr.Coritoru. Sending interoffice to [employer] office for signing.

## 2016-06-12 ENCOUNTER — Telehealth: Payer: Self-pay | Admitting: Cardiovascular Disease

## 2016-06-12 NOTE — Telephone Encounter (Signed)
Faxed copy signed on 06/05/16 and faxed to [facility].  Original picked up by [facility].

## 2016-06-30 NOTE — Progress Notes (Signed)
   05/23/16 0952  SLP Time Calculation  SLP Start Time (ACUTE ONLY) 0930  SLP Stop Time (ACUTE ONLY) 0945  SLP Time Calculation (min) (ACUTE ONLY) 15 min  SLP G-Codes **NOT FOR INPATIENT CLASS**  Functional Assessment Tool Used skilled clinical judgement via chart review  Functional Limitations Swallowing  Swallow Current Status [id number]) [place]  Swallow Goal Status [id number]) [facility]  Swallow Discharge Status [id number]) [place]  SLP Evaluations  $ SLP Speech Visit 1 Procedure  SLP Evaluations  $BSS Swallow 1 Procedure  Gabriel Rainwater MA, CCC-SLP [phone]

## 2016-06-30 NOTE — Discharge Summary (Signed)
     Patient ID: [name] MRN: [id number] DOB/AGE: 79/21/1937 79 y.o.  Admit date: 05/19/2016 Discharge date: 06-02-16  Primary Discharge Diagnosis: Cardiac arrest Secondary Discharge Diagnosis: Cardiac arrest due to ischemic cardiomyopathy  Significant Diagnostic Studies: None  Consults: Critical care medicine.  Hospital Course: This 81 year old gentleman with multiple severe cardiac comorbidities including dementia, incontinence, decreased mobility, atrial fibrillation, chronic systolic heart failure related to ischemic cardiomyopathy with EF less than 25%, alcohol use, depression, prostate cancer, and recent progressive weakness and failure to thrive. Recent hospitalization for pneumonia.  He was found down at home. No indication of how long that perhaps up to 15 minutes. CPR was performed by emergency medical service. Return of spontaneous circulation after approximately 20 minutes of ACLS. Required electrical shock 1.  Initially STEMI activation occurred. Upon arrival in the hospital, review of his records revealed DO NOT RESUSCITATE on 04/19/2016 by Dr. Sherren Mocha McDiarmid. Discussed with family (nieces and nephews who confirmed DO NOT RESUSCITATE). STEMI activation was discontinued.  The patient was admitted to the hospital status post cardiac arrest with goals of care to be comfort and no escalation.  He was seen by critical care medicine who concurred with approach.  Developed ventricular tachycardia that degenerated into ventricular fibrillation. He was pronounced dead at 12:48 AM, 06/02/16.   Discharge Exam: Blood pressure (!) 108/50, pulse (!) 45, temperature 99 F (37.2 C), temperature source Oral, resp. rate (!) 24, height 5\' 11"  (1.803 m), weight 168 lb 10.4 oz (76.5 kg), SpO2 100 %.     Labs:   Lab Results  Component Value Date   WBC 9.0 05/28/2016   HGB 10.3 (L) 05/04/2016   HCT 32.6 (L) 05/14/2016   MCV 93.4 05/23/2016   PLT 201 05/11/2016    Recent  Labs Lab 05/06/2016 2142  NA 136  K 3.9  CL 101  CO2 18*  BUN 14  CREATININE 1.40*  CALCIUM 9.1  PROT 6.7  BILITOT 1.3*  ALKPHOS 87  ALT 27  AST 54*  GLUCOSE 183*   Lab Results  Component Value Date   CKTOTAL 31 11/13/2011   CKMB 1.6 11/13/2011   TROPONINI 0.06 (HH) 12/25/2015    Lab Results  Component Value Date   CHOL 128 02/27/2016   CHOL 104 02/21/2013   CHOL 153 06/20/2011   Lab Results  Component Value Date   HDL 43 02/27/2016   HDL 64 02/21/2013   HDL 72 06/20/2011   Lab Results  Component Value Date   LDLCALC 64 02/27/2016   LDLCALC 26 02/21/2013   LDLCALC 59 06/20/2011   Lab Results  Component Value Date   TRIG 108 02/27/2016   TRIG 68 02/21/2013   TRIG 109 06/20/2011   Lab Results  Component Value Date   CHOLHDL 1.6 02/21/2013   CHOLHDL 2.1 06/20/2011   CHOLHDL 1.8 11/11/2009   Lab Results  Component Value Date   LDLDIRECT 53 10/26/2009   LDLDIRECT 83 10/27/2006      Radiology: Portable chest x-ray on 05/06/2016: IMPRESSION: 1. Endotracheal tube seen ending 5 cm above the carina. 2. Mild retrocardiac opacity again raises concern for mild pneumonia, improved from the prior study. 3. Mild cardiomegaly.   EKG: Initial EKG demonstrated ST elevation in V1 through V3 with atrial fibrillation   Time spent with patient to include physician time: 20 Signed: Belva Crome III 2016-06-02, 8:22 AM
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
## 2016-06-30 NOTE — Progress Notes (Signed)
Fentanyl drip 230 ml & versed drip 43 ml wasted in sink by Melene Plan and Ezekiel Slocumb RN.  Cardiology fellow on call and E-Link (CCM) both notified of patient's death.

## 2016-06-30 NOTE — Progress Notes (Signed)
Late entry for missed G-code. Based on review of the evaluation and goals by Mee Hives, PT.   05/25/2016 0932  PT G-Codes **NOT FOR INPATIENT CLASS**  Functional Assessment Tool Used AM-PAC 6 Clicks Basic Mobility  Functional Limitation Mobility: Walking and moving around  Mobility: Walking and Moving Around Current Status [id number]) CJ  Mobility: Walking and Moving Around Goal Status [phone]) [place], [place]  [phone] 06-01-2016

## 2016-06-30 NOTE — Progress Notes (Signed)
I was in patient's room answering questions for niece and discussing with her what patients wishes would be. She was expressing that she wanted to wait until her brother got here from out of town to make official withdrawal of care decision. Reinforced with her that if patient cardiac arrested or BP dropped we would not intervene. Discussed how weak his heart was and that patient had had a long run of [place] already since arriving on the unit and would most likely have more. While I still in the room patient went into Vtach rhythm at [place] that progressed to v Fib. Patient without a pulse and no longer breathing over ventilator as he had been doing previously. Stayed with niece in the room providing emotional support. Pronounced at 0048 by 2 RN- Doretha Sou and myself, Melene Plan. No heart or lung sounds heard for 1 minute. Multiple family members now at bedside. ET tube removed for family.

## 2016-06-30 DEATH — deceased
